# Patient Record
Sex: Female | Born: 1965 | Race: Black or African American | Hispanic: No | Smoking: Current every day smoker
Health system: Southern US, Community
[De-identification: ages and names within clinical notes are randomized; demographics above are authoritative.]

## PROBLEM LIST (undated history)

## (undated) DIAGNOSIS — J45909 Unspecified asthma, uncomplicated: Secondary | ICD-10-CM

## (undated) DIAGNOSIS — E119 Type 2 diabetes mellitus without complications: Secondary | ICD-10-CM

## (undated) DIAGNOSIS — K429 Umbilical hernia without obstruction or gangrene: Secondary | ICD-10-CM

## (undated) DIAGNOSIS — M329 Systemic lupus erythematosus, unspecified: Secondary | ICD-10-CM

## (undated) DIAGNOSIS — M545 Low back pain, unspecified: Secondary | ICD-10-CM

## (undated) DIAGNOSIS — I10 Essential (primary) hypertension: Secondary | ICD-10-CM

## (undated) DIAGNOSIS — Z9289 Personal history of other medical treatment: Secondary | ICD-10-CM

## (undated) DIAGNOSIS — G8929 Other chronic pain: Secondary | ICD-10-CM

## (undated) DIAGNOSIS — K5792 Diverticulitis of intestine, part unspecified, without perforation or abscess without bleeding: Secondary | ICD-10-CM

## (undated) DIAGNOSIS — K219 Gastro-esophageal reflux disease without esophagitis: Secondary | ICD-10-CM

## (undated) DIAGNOSIS — Z862 Personal history of diseases of the blood and blood-forming organs and certain disorders involving the immune mechanism: Secondary | ICD-10-CM

## (undated) DIAGNOSIS — D649 Anemia, unspecified: Secondary | ICD-10-CM

## (undated) DIAGNOSIS — M069 Rheumatoid arthritis, unspecified: Secondary | ICD-10-CM

## (undated) DIAGNOSIS — R079 Chest pain, unspecified: Secondary | ICD-10-CM

## (undated) HISTORY — PX: UMBILICAL HERNIA REPAIR: SHX196

## (undated) HISTORY — PX: TONSILLECTOMY: SUR1361

## (undated) HISTORY — PX: HERNIA REPAIR: SHX51

## (undated) HISTORY — PX: TUBAL LIGATION: SHX77

---

## 1998-12-06 ENCOUNTER — Emergency Department (HOSPITAL_COMMUNITY): Admission: EM | Admit: 1998-12-06 | Discharge: 1998-12-06 | Payer: Self-pay | Admitting: Emergency Medicine

## 2003-08-03 ENCOUNTER — Emergency Department (HOSPITAL_COMMUNITY): Admission: EM | Admit: 2003-08-03 | Discharge: 2003-08-03 | Payer: Self-pay | Admitting: Emergency Medicine

## 2004-02-22 ENCOUNTER — Emergency Department (HOSPITAL_COMMUNITY): Admission: EM | Admit: 2004-02-22 | Discharge: 2004-02-22 | Payer: Self-pay | Admitting: Family Medicine

## 2005-06-07 ENCOUNTER — Emergency Department (HOSPITAL_COMMUNITY): Admission: EM | Admit: 2005-06-07 | Discharge: 2005-06-07 | Payer: Self-pay | Admitting: Emergency Medicine

## 2005-10-03 ENCOUNTER — Emergency Department (HOSPITAL_COMMUNITY): Admission: EM | Admit: 2005-10-03 | Discharge: 2005-10-03 | Payer: Self-pay | Admitting: Emergency Medicine

## 2006-03-05 ENCOUNTER — Emergency Department (HOSPITAL_COMMUNITY): Admission: EM | Admit: 2006-03-05 | Discharge: 2006-03-05 | Payer: Self-pay | Admitting: *Deleted

## 2006-09-18 HISTORY — PX: OTHER SURGICAL HISTORY: SHX169

## 2006-11-02 ENCOUNTER — Ambulatory Visit: Payer: Self-pay | Admitting: Internal Medicine

## 2006-12-03 ENCOUNTER — Encounter: Admission: RE | Admit: 2006-12-03 | Discharge: 2006-12-03 | Payer: Self-pay | Admitting: General Surgery

## 2006-12-04 ENCOUNTER — Ambulatory Visit (HOSPITAL_BASED_OUTPATIENT_CLINIC_OR_DEPARTMENT_OTHER): Admission: RE | Admit: 2006-12-04 | Discharge: 2006-12-04 | Payer: Self-pay | Admitting: General Surgery

## 2006-12-04 ENCOUNTER — Encounter (INDEPENDENT_AMBULATORY_CARE_PROVIDER_SITE_OTHER): Payer: Self-pay | Admitting: Specialist

## 2008-02-04 ENCOUNTER — Emergency Department (HOSPITAL_COMMUNITY): Admission: EM | Admit: 2008-02-04 | Discharge: 2008-02-04 | Payer: Self-pay | Admitting: Emergency Medicine

## 2009-08-20 ENCOUNTER — Emergency Department (HOSPITAL_COMMUNITY): Admission: EM | Admit: 2009-08-20 | Discharge: 2009-08-20 | Payer: Self-pay | Admitting: Family Medicine

## 2010-03-13 ENCOUNTER — Emergency Department (HOSPITAL_COMMUNITY): Admission: EM | Admit: 2010-03-13 | Discharge: 2010-03-13 | Payer: Self-pay | Admitting: Emergency Medicine

## 2010-08-09 ENCOUNTER — Ambulatory Visit (HOSPITAL_COMMUNITY): Admission: RE | Admit: 2010-08-09 | Discharge: 2010-08-09 | Payer: Self-pay | Admitting: Obstetrics

## 2010-08-25 ENCOUNTER — Emergency Department (HOSPITAL_COMMUNITY)
Admission: EM | Admit: 2010-08-25 | Discharge: 2010-08-25 | Disposition: A | Discharge status: Other Acute Inpatient Hospital | Payer: Self-pay | Source: Home / Self Care | Admitting: Family Medicine

## 2010-08-25 ENCOUNTER — Emergency Department (HOSPITAL_COMMUNITY)
Admission: EM | Admit: 2010-08-25 | Discharge: 2010-08-25 | Payer: Self-pay | Source: Home / Self Care | Admitting: Emergency Medicine

## 2010-08-26 ENCOUNTER — Inpatient Hospital Stay (HOSPITAL_COMMUNITY)
Admission: EM | Admit: 2010-08-26 | Discharge: 2010-09-09 | Payer: Self-pay | Source: Home / Self Care | Attending: Internal Medicine | Admitting: Internal Medicine

## 2010-09-05 ENCOUNTER — Ambulatory Visit: Payer: Self-pay | Admitting: Oncology

## 2010-09-14 LAB — CBC & DIFF AND RETIC
BASO%: 0.2 % (ref 0.0–2.0)
Basophils Absolute: 0 10*3/uL (ref 0.0–0.1)
EOS%: 0.6 % (ref 0.0–7.0)
Eosinophils Absolute: 0.1 10*3/uL (ref 0.0–0.5)
HCT: 31.9 % — ABNORMAL LOW (ref 34.8–46.6)
HGB: 9.9 g/dL — ABNORMAL LOW (ref 11.6–15.9)
Immature Retic Fract: 12.6 % — ABNORMAL HIGH (ref 0.00–10.70)
LYMPH%: 21.4 % (ref 14.0–49.7)
MCH: 29.2 pg (ref 25.1–34.0)
MCHC: 31 g/dL — ABNORMAL LOW (ref 31.5–36.0)
MCV: 94.1 fL (ref 79.5–101.0)
MONO#: 0.9 10*3/uL (ref 0.1–0.9)
MONO%: 10.7 % (ref 0.0–14.0)
NEUT#: 5.7 10*3/uL (ref 1.5–6.5)
NEUT%: 67.1 % (ref 38.4–76.8)
Platelets: 135 10*3/uL — ABNORMAL LOW (ref 145–400)
RBC: 3.39 10*6/uL — ABNORMAL LOW (ref 3.70–5.45)
RDW: 21.5 % — ABNORMAL HIGH (ref 11.2–14.5)
Retic %: 3.61 % — ABNORMAL HIGH (ref 0.50–1.50)
Retic Ct Abs: 122.38 10*3/uL — ABNORMAL HIGH (ref 18.30–72.70)
WBC: 8.5 10*3/uL (ref 3.9–10.3)
lymph#: 1.8 10*3/uL (ref 0.9–3.3)
nRBC: 0 % (ref 0–0)

## 2010-09-14 LAB — COMPREHENSIVE METABOLIC PANEL
ALT: 20 U/L (ref 0–35)
AST: 15 U/L (ref 0–37)
Albumin: 3.5 g/dL (ref 3.5–5.2)
Alkaline Phosphatase: 37 U/L — ABNORMAL LOW (ref 39–117)
BUN: 23 mg/dL (ref 6–23)
CO2: 24 mEq/L (ref 19–32)
Calcium: 8.7 mg/dL (ref 8.4–10.5)
Chloride: 105 mEq/L (ref 96–112)
Creatinine, Ser: 0.72 mg/dL (ref 0.40–1.20)
Glucose, Bld: 143 mg/dL — ABNORMAL HIGH (ref 70–99)
Potassium: 3.8 mEq/L (ref 3.5–5.3)
Sodium: 140 mEq/L (ref 135–145)
Total Bilirubin: 0.2 mg/dL — ABNORMAL LOW (ref 0.3–1.2)
Total Protein: 6.9 g/dL (ref 6.0–8.3)

## 2010-09-14 LAB — LACTATE DEHYDROGENASE: LDH: 228 U/L (ref 94–250)

## 2010-09-20 ENCOUNTER — Ambulatory Visit: Payer: Self-pay | Admitting: Oncology

## 2010-09-20 LAB — COMPREHENSIVE METABOLIC PANEL
ALT: 15 U/L (ref 0–35)
AST: 13 U/L (ref 0–37)
Albumin: 3.6 g/dL (ref 3.5–5.2)
Alkaline Phosphatase: 33 U/L — ABNORMAL LOW (ref 39–117)
BUN: 16 mg/dL (ref 6–23)
CO2: 26 mEq/L (ref 19–32)
Calcium: 8.6 mg/dL (ref 8.4–10.5)
Chloride: 106 mEq/L (ref 96–112)
Creatinine, Ser: 0.65 mg/dL (ref 0.40–1.20)
Glucose, Bld: 103 mg/dL — ABNORMAL HIGH (ref 70–99)
Potassium: 4.3 mEq/L (ref 3.5–5.3)
Sodium: 141 mEq/L (ref 135–145)
Total Bilirubin: 0.2 mg/dL — ABNORMAL LOW (ref 0.3–1.2)
Total Protein: 6.9 g/dL (ref 6.0–8.3)

## 2010-09-20 LAB — CBC & DIFF AND RETIC
BASO%: 0.1 % (ref 0.0–2.0)
Basophils Absolute: 0 10*3/uL (ref 0.0–0.1)
EOS%: 0 % (ref 0.0–7.0)
Eosinophils Absolute: 0 10*3/uL (ref 0.0–0.5)
HCT: 33.6 % — ABNORMAL LOW (ref 34.8–46.6)
HGB: 10.4 g/dL — ABNORMAL LOW (ref 11.6–15.9)
Immature Retic Fract: 10.1 % (ref 0.00–10.70)
LYMPH%: 9.6 % — ABNORMAL LOW (ref 14.0–49.7)
MCH: 29.1 pg (ref 25.1–34.0)
MCHC: 31 g/dL — ABNORMAL LOW (ref 31.5–36.0)
MCV: 94.1 fL (ref 79.5–101.0)
MONO#: 0.1 10*3/uL (ref 0.1–0.9)
MONO%: 1.1 % (ref 0.0–14.0)
NEUT#: 7.8 10*3/uL — ABNORMAL HIGH (ref 1.5–6.5)
NEUT%: 89.2 % — ABNORMAL HIGH (ref 38.4–76.8)
Platelets: 225 10*3/uL (ref 145–400)
RBC: 3.57 10*6/uL — ABNORMAL LOW (ref 3.70–5.45)
RDW: 19 % — ABNORMAL HIGH (ref 11.2–14.5)
Retic %: 2.87 % — ABNORMAL HIGH (ref 0.50–1.50)
Retic Ct Abs: 102.46 10*3/uL — ABNORMAL HIGH (ref 18.30–72.70)
WBC: 8.7 10*3/uL (ref 3.9–10.3)
lymph#: 0.8 10*3/uL — ABNORMAL LOW (ref 0.9–3.3)
nRBC: 0 % (ref 0–0)

## 2010-09-20 LAB — MORPHOLOGY: PLT EST: ADEQUATE

## 2010-09-20 LAB — LACTATE DEHYDROGENASE: LDH: 190 U/L (ref 94–250)

## 2010-09-27 LAB — CBC & DIFF AND RETIC
BASO%: 0.1 % (ref 0.0–2.0)
Basophils Absolute: 0 10*3/uL (ref 0.0–0.1)
EOS%: 0.1 % (ref 0.0–7.0)
Eosinophils Absolute: 0 10*3/uL (ref 0.0–0.5)
HCT: 35 % (ref 34.8–46.6)
HGB: 11.3 g/dL — ABNORMAL LOW (ref 11.6–15.9)
Immature Retic Fract: 6 % (ref 0.00–10.70)
LYMPH%: 11.7 % — ABNORMAL LOW (ref 14.0–49.7)
MCH: 29.7 pg (ref 25.1–34.0)
MCHC: 32.3 g/dL (ref 31.5–36.0)
MCV: 92.1 fL (ref 79.5–101.0)
MONO#: 0.3 10*3/uL (ref 0.1–0.9)
MONO%: 3.6 % (ref 0.0–14.0)
NEUT#: 7 10*3/uL — ABNORMAL HIGH (ref 1.5–6.5)
NEUT%: 84.5 % — ABNORMAL HIGH (ref 38.4–76.8)
Platelets: 263 10*3/uL (ref 145–400)
RBC: 3.8 10*6/uL (ref 3.70–5.45)
RDW: 17.1 % — ABNORMAL HIGH (ref 11.2–14.5)
Retic %: 1.63 % — ABNORMAL HIGH (ref 0.50–1.50)
Retic Ct Abs: 61.94 10*3/uL (ref 18.30–72.70)
WBC: 8.3 10*3/uL (ref 3.9–10.3)
lymph#: 1 10*3/uL (ref 0.9–3.3)

## 2010-09-27 LAB — COMPREHENSIVE METABOLIC PANEL
ALT: 16 U/L (ref 0–35)
AST: 12 U/L (ref 0–37)
Albumin: 4.1 g/dL (ref 3.5–5.2)
Alkaline Phosphatase: 33 U/L — ABNORMAL LOW (ref 39–117)
BUN: 18 mg/dL (ref 6–23)
CO2: 24 mEq/L (ref 19–32)
Calcium: 10.3 mg/dL (ref 8.4–10.5)
Chloride: 106 mEq/L (ref 96–112)
Creatinine, Ser: 0.75 mg/dL (ref 0.40–1.20)
Glucose, Bld: 162 mg/dL — ABNORMAL HIGH (ref 70–99)
Potassium: 3.9 mEq/L (ref 3.5–5.3)
Sodium: 141 mEq/L (ref 135–145)
Total Bilirubin: 0.2 mg/dL — ABNORMAL LOW (ref 0.3–1.2)
Total Protein: 7.3 g/dL (ref 6.0–8.3)

## 2010-09-27 LAB — HAPTOGLOBIN: Haptoglobin: 185 mg/dL (ref 16–200)

## 2010-09-27 LAB — MORPHOLOGY: PLT EST: ADEQUATE

## 2010-09-27 LAB — LACTATE DEHYDROGENASE: LDH: 182 U/L (ref 94–250)

## 2010-10-05 LAB — CBC WITH DIFFERENTIAL/PLATELET
BASO%: 0.5 % (ref 0.0–2.0)
Basophils Absolute: 0 10*3/uL (ref 0.0–0.1)
EOS%: 0.5 % (ref 0.0–7.0)
Eosinophils Absolute: 0 10*3/uL (ref 0.0–0.5)
HCT: 42.7 % (ref 34.8–46.6)
HGB: 13.8 g/dL (ref 11.6–15.9)
LYMPH%: 49.2 % (ref 14.0–49.7)
MCH: 29.2 pg (ref 25.1–34.0)
MCHC: 32.3 g/dL (ref 31.5–36.0)
MCV: 90.3 fL (ref 79.5–101.0)
MONO#: 0.5 10*3/uL (ref 0.1–0.9)
MONO%: 8 % (ref 0.0–14.0)
NEUT#: 2.5 10*3/uL (ref 1.5–6.5)
NEUT%: 41.8 % (ref 38.4–76.8)
Platelets: 309 10*3/uL (ref 145–400)
RBC: 4.73 10*6/uL (ref 3.70–5.45)
RDW: 15.9 % — ABNORMAL HIGH (ref 11.2–14.5)
WBC: 5.9 10*3/uL (ref 3.9–10.3)
lymph#: 2.9 10*3/uL (ref 0.9–3.3)

## 2010-10-09 ENCOUNTER — Encounter: Payer: Self-pay | Admitting: Obstetrics

## 2010-10-13 ENCOUNTER — Ambulatory Visit: Admit: 2010-10-13 | Payer: Self-pay | Admitting: Obstetrics and Gynecology

## 2010-11-03 ENCOUNTER — Other Ambulatory Visit: Payer: Self-pay | Admitting: Oncology

## 2010-11-03 ENCOUNTER — Encounter (HOSPITAL_BASED_OUTPATIENT_CLINIC_OR_DEPARTMENT_OTHER): Payer: Medicaid Other | Admitting: Oncology

## 2010-11-03 LAB — CBC WITH DIFFERENTIAL/PLATELET
BASO%: 0.6 % (ref 0.0–2.0)
Basophils Absolute: 0 10*3/uL (ref 0.0–0.1)
EOS%: 3.9 % (ref 0.0–7.0)
Eosinophils Absolute: 0.3 10*3/uL (ref 0.0–0.5)
HCT: 39.8 % (ref 34.8–46.6)
HGB: 12.9 g/dL (ref 11.6–15.9)
LYMPH%: 35.9 % (ref 14.0–49.7)
MCH: 28.8 pg (ref 25.1–34.0)
MCHC: 32.4 g/dL (ref 31.5–36.0)
MCV: 88.8 fL (ref 79.5–101.0)
MONO#: 0.8 10*3/uL (ref 0.1–0.9)
MONO%: 12.1 % (ref 0.0–14.0)
NEUT#: 3.2 10*3/uL (ref 1.5–6.5)
NEUT%: 47.5 % (ref 38.4–76.8)
Platelets: 249 10*3/uL (ref 145–400)
RBC: 4.48 10*6/uL (ref 3.70–5.45)
RDW: 14.3 % (ref 11.2–14.5)
WBC: 6.7 10*3/uL (ref 3.9–10.3)
lymph#: 2.4 10*3/uL (ref 0.9–3.3)
nRBC: 1 % — ABNORMAL HIGH (ref 0–0)

## 2010-11-05 ENCOUNTER — Inpatient Hospital Stay (HOSPITAL_COMMUNITY)
Admission: RE | Admit: 2010-11-05 | Discharge: 2010-11-05 | Disposition: A | Discharge status: Home / Self Care | Payer: Medicaid Other | Source: Ambulatory Visit

## 2010-11-05 ENCOUNTER — Other Ambulatory Visit: Payer: Self-pay | Admitting: Family Medicine

## 2010-11-05 DIAGNOSIS — J029 Acute pharyngitis, unspecified: Secondary | ICD-10-CM

## 2010-11-05 DIAGNOSIS — B9789 Other viral agents as the cause of diseases classified elsewhere: Secondary | ICD-10-CM

## 2010-11-11 ENCOUNTER — Encounter (HOSPITAL_BASED_OUTPATIENT_CLINIC_OR_DEPARTMENT_OTHER): Payer: Medicaid Other | Admitting: Oncology

## 2010-11-11 ENCOUNTER — Other Ambulatory Visit: Payer: Self-pay | Admitting: Oncology

## 2010-11-11 LAB — CBC WITH DIFFERENTIAL/PLATELET
BASO%: 1.3 % (ref 0.0–2.0)
Basophils Absolute: 0.1 10*3/uL (ref 0.0–0.1)
EOS%: 10.2 % — ABNORMAL HIGH (ref 0.0–7.0)
Eosinophils Absolute: 0.5 10*3/uL (ref 0.0–0.5)
HCT: 40.2 % (ref 34.8–46.6)
HGB: 13.2 g/dL (ref 11.6–15.9)
LYMPH%: 49.3 % (ref 14.0–49.7)
MCH: 28.6 pg (ref 25.1–34.0)
MCHC: 32.8 g/dL (ref 31.5–36.0)
MCV: 87.2 fL (ref 79.5–101.0)
MONO#: 0.7 10*3/uL (ref 0.1–0.9)
MONO%: 15.3 % — ABNORMAL HIGH (ref 0.0–14.0)
NEUT#: 1.1 10*3/uL — ABNORMAL LOW (ref 1.5–6.5)
NEUT%: 23.9 % — ABNORMAL LOW (ref 38.4–76.8)
Platelets: 299 10*3/uL (ref 145–400)
RBC: 4.61 10*6/uL (ref 3.70–5.45)
RDW: 14.1 % (ref 11.2–14.5)
WBC: 4.5 10*3/uL (ref 3.9–10.3)
lymph#: 2.2 10*3/uL (ref 0.9–3.3)
nRBC: 2 % — ABNORMAL HIGH (ref 0–0)

## 2010-11-16 ENCOUNTER — Ambulatory Visit (INDEPENDENT_AMBULATORY_CARE_PROVIDER_SITE_OTHER): Payer: Medicaid Other | Admitting: Obstetrics & Gynecology

## 2010-11-18 LAB — POCT RAPID STREP A (OFFICE): Streptococcus, Group A Screen (Direct): NEGATIVE

## 2010-11-28 LAB — DIFFERENTIAL
Basophils Absolute: 0 10*3/uL (ref 0.0–0.1)
Basophils Absolute: 0 10*3/uL (ref 0.0–0.1)
Basophils Absolute: 0 10*3/uL (ref 0.0–0.1)
Basophils Absolute: 0.2 10*3/uL — ABNORMAL HIGH (ref 0.0–0.1)
Basophils Relative: 1 % (ref 0–1)
Eosinophils Absolute: 0 10*3/uL (ref 0.0–0.7)
Eosinophils Absolute: 0 10*3/uL (ref 0.0–0.7)
Eosinophils Absolute: 0.1 10*3/uL (ref 0.0–0.7)
Eosinophils Absolute: 0.1 10*3/uL (ref 0.0–0.7)
Eosinophils Relative: 0 % (ref 0–5)
Lymphocytes Relative: 15 % (ref 12–46)
Lymphocytes Relative: 34 % (ref 12–46)
Lymphs Abs: 2 10*3/uL (ref 0.7–4.0)
Lymphs Abs: 0.6 10*3/uL — ABNORMAL LOW (ref 0.7–4.0)
Lymphs Abs: 1.4 10*3/uL (ref 0.7–4.0)
Monocytes Absolute: 0.1 10*3/uL (ref 0.1–1.0)
Monocytes Absolute: 0.4 10*3/uL (ref 0.1–1.0)
Monocytes Absolute: 0.4 10*3/uL (ref 0.1–1.0)
Monocytes Absolute: 1.7 10*3/uL — ABNORMAL HIGH (ref 0.1–1.0)
Monocytes Relative: 8 % (ref 3–12)
Monocytes Relative: 8 % (ref 3–12)
Neutro Abs: 16.1 10*3/uL — ABNORMAL HIGH (ref 1.7–7.7)
Neutro Abs: 14.2 10*3/uL — ABNORMAL HIGH (ref 1.7–7.7)
Neutro Abs: 2.5 10*3/uL (ref 1.7–7.7)
Neutro Abs: 5.7 10*3/uL (ref 1.7–7.7)
Neutrophils Relative %: 56 % (ref 43–77)
Neutrophils Relative %: 75 % (ref 43–77)

## 2010-11-28 LAB — CBC
HCT: 26.4 % — ABNORMAL LOW (ref 36.0–46.0)
HCT: 26.6 % — ABNORMAL LOW (ref 36.0–46.0)
HCT: 27.6 % — ABNORMAL LOW (ref 36.0–46.0)
HCT: 28 % — ABNORMAL LOW (ref 36.0–46.0)
HCT: 29.5 % — ABNORMAL LOW (ref 36.0–46.0)
HCT: 29.7 % — ABNORMAL LOW (ref 36.0–46.0)
HCT: 31.8 % — ABNORMAL LOW (ref 36.0–46.0)
HCT: 24.2 % — ABNORMAL LOW (ref 36.0–46.0)
HCT: 24.5 % — ABNORMAL LOW (ref 36.0–46.0)
HCT: 25.6 % — ABNORMAL LOW (ref 36.0–46.0)
HCT: 27.5 % — ABNORMAL LOW (ref 36.0–46.0)
HCT: 28.3 % — ABNORMAL LOW (ref 36.0–46.0)
Hemoglobin: 7.9 g/dL — ABNORMAL LOW (ref 12.0–15.0)
Hemoglobin: 8 g/dL — ABNORMAL LOW (ref 12.0–15.0)
Hemoglobin: 8.2 g/dL — ABNORMAL LOW (ref 12.0–15.0)
Hemoglobin: 8.4 g/dL — ABNORMAL LOW (ref 12.0–15.0)
Hemoglobin: 9 g/dL — ABNORMAL LOW (ref 12.0–15.0)
Hemoglobin: 9.2 g/dL — ABNORMAL LOW (ref 12.0–15.0)
Hemoglobin: 9.3 g/dL — ABNORMAL LOW (ref 12.0–15.0)
Hemoglobin: 4.8 g/dL — CL (ref 12.0–15.0)
Hemoglobin: 7.5 g/dL — ABNORMAL LOW (ref 12.0–15.0)
Hemoglobin: 8.3 g/dL — ABNORMAL LOW (ref 12.0–15.0)
Hemoglobin: 8.5 g/dL — ABNORMAL LOW (ref 12.0–15.0)
Hemoglobin: 8.5 g/dL — ABNORMAL LOW (ref 12.0–15.0)
Hemoglobin: 8.8 g/dL — ABNORMAL LOW (ref 12.0–15.0)
MCH: 27.6 pg (ref 26.0–34.0)
MCH: 27.9 pg (ref 26.0–34.0)
MCH: 28.2 pg (ref 26.0–34.0)
MCH: 28.5 pg (ref 26.0–34.0)
MCH: 28.8 pg (ref 26.0–34.0)
MCH: 29 pg (ref 26.0–34.0)
MCH: 23.9 pg — ABNORMAL LOW (ref 26.0–34.0)
MCH: 24 pg — ABNORMAL LOW (ref 26.0–34.0)
MCH: 28 pg (ref 26.0–34.0)
MCH: 29.4 pg (ref 26.0–34.0)
MCHC: 29.2 g/dL — ABNORMAL LOW (ref 30.0–36.0)
MCHC: 29.7 g/dL — ABNORMAL LOW (ref 30.0–36.0)
MCHC: 29.7 g/dL — ABNORMAL LOW (ref 30.0–36.0)
MCHC: 29.8 g/dL — ABNORMAL LOW (ref 30.0–36.0)
MCHC: 30 g/dL (ref 30.0–36.0)
MCHC: 30.3 g/dL (ref 30.0–36.0)
MCHC: 30.3 g/dL (ref 30.0–36.0)
MCHC: 27.7 g/dL — ABNORMAL LOW (ref 30.0–36.0)
MCHC: 27.9 g/dL — ABNORMAL LOW (ref 30.0–36.0)
MCHC: 28.1 g/dL — ABNORMAL LOW (ref 30.0–36.0)
MCHC: 30.6 g/dL (ref 30.0–36.0)
MCHC: 30.9 g/dL (ref 30.0–36.0)
MCHC: 30.9 g/dL (ref 30.0–36.0)
MCV: 92.1 fL (ref 78.0–100.0)
MCV: 94 fL (ref 78.0–100.0)
MCV: 95.1 fL (ref 78.0–100.0)
MCV: 96.1 fL (ref 78.0–100.0)
MCV: 96.4 fL (ref 78.0–100.0)
MCV: 96.4 fL (ref 78.0–100.0)
MCV: 96.9 fL (ref 78.0–100.0)
MCV: 85.6 fL (ref 78.0–100.0)
MCV: 86.5 fL (ref 78.0–100.0)
MCV: 88.6 fL (ref 78.0–100.0)
MCV: 88.8 fL (ref 78.0–100.0)
MCV: 90.5 fL (ref 78.0–100.0)
MCV: 92.9 fL (ref 78.0–100.0)
Platelets: 14 10*3/uL — CL (ref 150–400)
Platelets: 17 10*3/uL — CL (ref 150–400)
Platelets: 20 10*3/uL — CL (ref 150–400)
Platelets: 24 10*3/uL — CL (ref 150–400)
Platelets: 12 10*3/uL — CL (ref 150–400)
Platelets: 35 10*3/uL — ABNORMAL LOW (ref 150–400)
Platelets: <5 10*3/uL — CL (ref 150–400)
Platelets: <5 10*3/uL — CL (ref 150–400)
RBC: 2.83 MIL/uL — ABNORMAL LOW (ref 3.87–5.11)
RBC: 2.85 MIL/uL — ABNORMAL LOW (ref 3.87–5.11)
RBC: 2.88 MIL/uL — ABNORMAL LOW (ref 3.87–5.11)
RBC: 3.04 MIL/uL — ABNORMAL LOW (ref 3.87–5.11)
RBC: 2.09 MIL/uL — ABNORMAL LOW (ref 3.87–5.11)
RBC: 2.76 MIL/uL — ABNORMAL LOW (ref 3.87–5.11)
RBC: 2.82 MIL/uL — ABNORMAL LOW (ref 3.87–5.11)
RBC: 3.04 MIL/uL — ABNORMAL LOW (ref 3.87–5.11)
RBC: 3.29 MIL/uL — ABNORMAL LOW (ref 3.87–5.11)
RDW: 23.9 % — ABNORMAL HIGH (ref 11.5–15.5)
RDW: 24.4 % — ABNORMAL HIGH (ref 11.5–15.5)
RDW: 25.4 % — ABNORMAL HIGH (ref 11.5–15.5)
RDW: 26.2 % — ABNORMAL HIGH (ref 11.5–15.5)
RDW: 18.4 % — ABNORMAL HIGH (ref 11.5–15.5)
RDW: 19 % — ABNORMAL HIGH (ref 11.5–15.5)
RDW: 20.5 % — ABNORMAL HIGH (ref 11.5–15.5)
RDW: 21.5 % — ABNORMAL HIGH (ref 11.5–15.5)
RDW: 21.8 % — ABNORMAL HIGH (ref 11.5–15.5)
RDW: 21.8 % — ABNORMAL HIGH (ref 11.5–15.5)
RDW: 21.9 % — ABNORMAL HIGH (ref 11.5–15.5)
WBC: 16.6 10*3/uL — ABNORMAL HIGH (ref 4.0–10.5)
WBC: 18.4 10*3/uL — ABNORMAL HIGH (ref 4.0–10.5)
WBC: 20.8 10*3/uL — ABNORMAL HIGH (ref 4.0–10.5)
WBC: 16.5 10*3/uL — ABNORMAL HIGH (ref 4.0–10.5)
WBC: 17 10*3/uL — ABNORMAL HIGH (ref 4.0–10.5)
WBC: 18.9 10*3/uL — ABNORMAL HIGH (ref 4.0–10.5)
WBC: 4.7 10*3/uL (ref 4.0–10.5)
WBC: 5.8 10*3/uL (ref 4.0–10.5)
WBC: 9.5 10*3/uL (ref 4.0–10.5)

## 2010-11-28 LAB — CROSSMATCH
Antibody Screen: POSITIVE
Unit division: 0
Unit division: 0

## 2010-11-28 LAB — CULTURE, BLOOD (ROUTINE X 2)
Culture  Setup Time: 201112091940
Culture: NO GROWTH

## 2010-11-28 LAB — COMPREHENSIVE METABOLIC PANEL
ALT: 61 U/L — ABNORMAL HIGH (ref 0–35)
ALT: 13 U/L (ref 0–35)
AST: 27 U/L (ref 0–37)
Albumin: 2.8 g/dL — ABNORMAL LOW (ref 3.5–5.2)
Albumin: 2.8 g/dL — ABNORMAL LOW (ref 3.5–5.2)
Alkaline Phosphatase: 42 U/L (ref 39–117)
Alkaline Phosphatase: 44 U/L (ref 39–117)
Alkaline Phosphatase: 53 U/L (ref 39–117)
BUN: 15 mg/dL (ref 6–23)
BUN: 10 mg/dL (ref 6–23)
CO2: 26 mEq/L (ref 19–32)
CO2: 28 mEq/L (ref 19–32)
CO2: 22 mEq/L (ref 19–32)
Calcium: 9 mg/dL (ref 8.4–10.5)
Chloride: 100 mEq/L (ref 96–112)
Chloride: 106 mEq/L (ref 96–112)
Chloride: 108 mEq/L (ref 96–112)
Creatinine, Ser: 0.65 mg/dL (ref 0.4–1.2)
Creatinine, Ser: 0.63 mg/dL (ref 0.4–1.2)
GFR calc Af Amer: >60 mL/min (ref >60)
GFR calc Af Amer: >60 mL/min (ref >60)
GFR calc non Af Amer: >60 mL/min (ref >60)
GFR calc non Af Amer: >60 mL/min (ref >60)
GFR calc non Af Amer: >60 mL/min (ref >60)
Glucose, Bld: 122 mg/dL — ABNORMAL HIGH (ref 70–99)
Glucose, Bld: 134 mg/dL — ABNORMAL HIGH (ref 70–99)
Potassium: 4 mEq/L (ref 3.5–5.1)
Potassium: 4.2 mEq/L (ref 3.5–5.1)
Potassium: 3.9 mEq/L (ref 3.5–5.1)
Sodium: 135 mEq/L (ref 135–145)
Sodium: 135 mEq/L (ref 135–145)
Total Bilirubin: 0.4 mg/dL (ref 0.3–1.2)
Total Bilirubin: 0.6 mg/dL (ref 0.3–1.2)
Total Bilirubin: 0.4 mg/dL (ref 0.3–1.2)
Total Protein: 8.6 g/dL — ABNORMAL HIGH (ref 6.0–8.3)

## 2010-11-28 LAB — HAPTOGLOBIN
Haptoglobin: <6 mg/dL — ABNORMAL LOW (ref 16–200)
Haptoglobin: <6 mg/dL — ABNORMAL LOW (ref 16–200)
Haptoglobin: 128 mg/dL (ref 16–200)
Haptoglobin: 139 mg/dL (ref 16–200)
Haptoglobin: <6 mg/dL — ABNORMAL LOW (ref 16–200)

## 2010-11-28 LAB — RETICULOCYTES
Retic Count, Absolute: 134 10*3/uL (ref 19.0–186.0)
Retic Count, Absolute: 242.2 10*3/uL — ABNORMAL HIGH (ref 19.0–186.0)
Retic Ct Pct: 12.5 % — ABNORMAL HIGH (ref 0.4–3.1)
Retic Ct Pct: 8.1 % — ABNORMAL HIGH (ref 0.4–3.1)

## 2010-11-28 LAB — DIC (DISSEMINATED INTRAVASCULAR COAGULATION)PANEL
D-Dimer, Quant: 1.66 ug/mL-FEU — ABNORMAL HIGH (ref 0.00–0.48)
Fibrinogen: 335 mg/dL (ref 204–475)
Smear Review: NONE SEEN
aPTT: 25 seconds (ref 24–37)

## 2010-11-28 LAB — URINALYSIS, ROUTINE W REFLEX MICROSCOPIC
Bilirubin Urine: NEGATIVE
Glucose, UA: NEGATIVE mg/dL
Ketones, ur: NEGATIVE mg/dL
Leukocytes, UA: NEGATIVE
pH: 7 (ref 5.0–8.0)

## 2010-11-28 LAB — ANA: Anti Nuclear Antibody(ANA): POSITIVE — AB

## 2010-11-28 LAB — BETA-2-GLYCOPROTEIN I ABS, IGG/M/A
Beta-2-Glycoprotein I IgA: 5 A Units (ref <20)
Beta-2-Glycoprotein I IgM: 0 M Units (ref <20)

## 2010-11-28 LAB — C3 COMPLEMENT: C3 Complement: 116 mg/dL (ref 88–201)

## 2010-11-28 LAB — TSH: TSH: 0.579 u[IU]/mL (ref 0.350–4.500)

## 2010-11-28 LAB — IRON AND TIBC
Iron: 31 ug/dL — ABNORMAL LOW (ref 42–135)
Saturation Ratios: 12 % — ABNORMAL LOW (ref 20–55)
Saturation Ratios: 7 % — ABNORMAL LOW (ref 20–55)
TIBC: 418 ug/dL (ref 250–470)
TIBC: 422 ug/dL (ref 250–470)
UIBC: 387 ug/dL

## 2010-11-28 LAB — MRSA PCR SCREENING: MRSA by PCR: NEGATIVE

## 2010-11-28 LAB — PREPARE PLATELETS

## 2010-11-28 LAB — BASIC METABOLIC PANEL
BUN: 13 mg/dL (ref 6–23)
BUN: 13 mg/dL (ref 6–23)
CO2: 22 mEq/L (ref 19–32)
CO2: 22 mEq/L (ref 19–32)
CO2: 24 mEq/L (ref 19–32)
Calcium: 8.5 mg/dL (ref 8.4–10.5)
Calcium: 9.2 mg/dL (ref 8.4–10.5)
Chloride: 106 mEq/L (ref 96–112)
Chloride: 106 mEq/L (ref 96–112)
Chloride: 112 mEq/L (ref 96–112)
Creatinine, Ser: 0.62 mg/dL (ref 0.4–1.2)
Creatinine, Ser: 0.66 mg/dL (ref 0.4–1.2)
Creatinine, Ser: 0.68 mg/dL (ref 0.4–1.2)
GFR calc Af Amer: >60 mL/min (ref >60)
GFR calc Af Amer: >60 mL/min (ref >60)
GFR calc Af Amer: >60 mL/min (ref >60)
GFR calc non Af Amer: >60 mL/min (ref >60)
GFR calc non Af Amer: >60 mL/min (ref >60)
Glucose, Bld: 139 mg/dL — ABNORMAL HIGH (ref 70–99)
Potassium: 3.9 mEq/L (ref 3.5–5.1)
Sodium: 135 mEq/L (ref 135–145)
Sodium: 137 mEq/L (ref 135–145)

## 2010-11-28 LAB — PREGNANCY, URINE: Preg Test, Ur: NEGATIVE

## 2010-11-28 LAB — CYCLIC CITRUL PEPTIDE ANTIBODY, IGG: Cyclic Citrullin Peptide Ab: <2 U/mL (ref 0.0–5.0)

## 2010-11-28 LAB — PATHOLOGIST SMEAR REVIEW

## 2010-11-28 LAB — SEDIMENTATION RATE: Sed Rate: 129 mm/hr — ABNORMAL HIGH (ref 0–22)

## 2010-11-28 LAB — LACTATE DEHYDROGENASE
LDH: 379 U/L — ABNORMAL HIGH (ref 94–250)
LDH: 296 U/L — ABNORMAL HIGH (ref 94–250)

## 2010-11-28 LAB — APTT: aPTT: 27 seconds (ref 24–37)

## 2010-11-28 LAB — CRYOGLOBULIN

## 2010-11-28 LAB — MAGNESIUM: Magnesium: 1.7 mg/dL (ref 1.5–2.5)

## 2010-11-28 LAB — URINE MICROSCOPIC-ADD ON

## 2010-11-28 LAB — URINE CULTURE: Culture  Setup Time: 201112091407

## 2010-11-28 LAB — COMPLEMENT, TOTAL: Compl, Total (CH50): >60 U/mL — ABNORMAL HIGH (ref 31–60)

## 2010-11-28 LAB — LUPUS ANTICOAGULANT PANEL: Lupus Anticoagulant: NOT DETECTED

## 2010-11-28 LAB — WET PREP, GENITAL: WBC, Wet Prep HPF POC: NONE SEEN

## 2010-11-28 LAB — HEPATITIS PANEL, ACUTE
HCV Ab: NEGATIVE
Hep A IgM: NEGATIVE

## 2010-11-28 LAB — SJOGRENS SYNDROME-A EXTRACTABLE NUCLEAR ANTIBODY: SSA (Ro) (ENA) Antibody, IgG: 63 AU/mL — ABNORMAL HIGH (ref <30)

## 2010-11-28 LAB — FERRITIN: Ferritin: 31 ng/mL (ref 10–291)

## 2010-11-28 LAB — C-REACTIVE PROTEIN: CRP: 0.3 mg/dL — ABNORMAL LOW (ref <0.6)

## 2010-11-28 LAB — FOLATE: Folate: >20 ng/mL

## 2010-11-28 LAB — ANTI-RIBONUCLEIC ACID ANTIBODY: Sm/rnp: 48 AU/mL — ABNORMAL HIGH (ref <30)

## 2010-11-28 LAB — SJOGRENS SYNDROME-B EXTRACTABLE NUCLEAR ANTIBODY: SSB (La) (ENA) Antibody, IgG: 3 AU/mL (ref <30)

## 2010-11-28 LAB — RHEUMATOID FACTOR: Rhuematoid fact SerPl-aCnc: <20 IU/mL (ref 0–20)

## 2010-11-28 LAB — ANTI-SMOOTH MUSCLE ANTIBODY, IGG: F-Actin IgG: <20

## 2010-11-29 LAB — CBC
HCT: 17.9 % — ABNORMAL LOW (ref 36.0–46.0)
MCH: 22.5 pg — ABNORMAL LOW (ref 26.0–34.0)
MCV: 73.9 fL — ABNORMAL LOW (ref 78.0–100.0)
RBC: 2.42 MIL/uL — ABNORMAL LOW (ref 3.87–5.11)
WBC: 6.7 10*3/uL (ref 4.0–10.5)

## 2010-11-30 ENCOUNTER — Ambulatory Visit: Payer: Medicaid Other | Admitting: Obstetrics & Gynecology

## 2010-12-09 ENCOUNTER — Other Ambulatory Visit: Payer: Self-pay | Admitting: Oncology

## 2010-12-09 ENCOUNTER — Encounter (HOSPITAL_BASED_OUTPATIENT_CLINIC_OR_DEPARTMENT_OTHER): Payer: Medicaid Other | Admitting: Oncology

## 2010-12-09 LAB — CBC WITH DIFFERENTIAL/PLATELET
EOS%: 2.3 % (ref 0.0–7.0)
LYMPH%: 32.4 % (ref 14.0–49.7)
MCH: 28 pg (ref 25.1–34.0)
MCV: 85.2 fL (ref 79.5–101.0)
MONO%: 9.5 % (ref 0.0–14.0)
Platelets: 293 10*3/uL (ref 145–400)
RBC: 4.53 10*6/uL (ref 3.70–5.45)
RDW: 13.6 % (ref 11.2–14.5)
nRBC: 0 % (ref 0–0)

## 2010-12-20 LAB — POCT URINALYSIS DIP (DEVICE)
Glucose, UA: NEGATIVE mg/dL
Nitrite: NEGATIVE
Protein, ur: NEGATIVE mg/dL
Specific Gravity, Urine: 1.01 (ref 1.005–1.030)
Urobilinogen, UA: 0.2 mg/dL (ref 0.0–1.0)

## 2010-12-20 LAB — URINE CULTURE

## 2010-12-20 LAB — POCT PREGNANCY, URINE: Preg Test, Ur: NEGATIVE

## 2011-01-11 ENCOUNTER — Encounter: Payer: Medicaid Other | Admitting: Family Medicine

## 2011-02-03 NOTE — Op Note (Signed)
NAME:  Dana Kennedy, Dana Kennedy                 ACCOUNT NO.:  [id number]   MEDICAL RECORD NO.:  [id number]          PATIENT TYPE:  AMB   LOCATION:  DSC                          FACILITY:  [facility]   PHYSICIAN:  Cherylynn Ridges, M.D.    DATE OF BIRTH:  1966/01/26   DATE OF PROCEDURE:  12/04/2006  DATE OF DISCHARGE:                               OPERATIVE REPORT   PREOPERATIVE DIAGNOSES:  1. Epigastric hernia.  2. Umbilical hernia.  3. Right axillary lipoma.   POSTOPERATIVE DIAGNOSES:  1. Epigastric hernia measuring 3 cm in size.  2. Umbilical hernia measuring 3 cm in size.  3. Right axillary lipoma measuring 10 x 10 x 4 cm.   PROCEDURES:  1. Repair of epigastric hernia with mesh.  2. Repair of umbilical hernia without mesh.  3. Resection and excision of lipoma.   SURGEON:  Marta Lamas. Lindie Spruce, M.D.   ASSISTANT:  None.   ANESTHESIA:  General endotracheal.   ESTIMATED BLOOD LOSS:  Less than 30 mL.   COMPLICATIONS:  None.   CONDITION:  Stable.   SPECIMEN:  Lipoma.   OPERATION:  The patient was taken to the operating room, placed on table  in supine position.  After an adequate general anesthetic was  administered, she was prepped and draped in the usual sterile manner  exposing the midline.   A midline incision was made using a #10 blade, made initially about 5-6  cm long.  We excised into subcutaneous tissue down around the hernia  sac, which protruded out.  The neck of the sac was only about 3 cm.  We  dissected out the sac at that level and ligated it with 3-0 Vicryl ties  as we excised the excess sac.  We allowed the ligated portion that was  transected to fall back into the subfascial space and then we closed off  this using interrupted #1 Novofil sutures.   Once this was done we placed an oval piece of mesh measuring 4 x 2 cm in  size on top of the previous primary repair with #1 Novofil and tacked it  down with #1 Novofil.  We irrigated and then soaked the mesh in  antibiotic  solution.  Then we closed this hernia in three layers, a deep  subcu layer of 2-0 Vicryl, a more superficial subcu layer of 3-0 Vicryl,  and then the skin was closed using stainless steel staples.   We then addressed the umbilical hernia, which was dissected out through  a subumbilical incision.  There were defects, one which was just  inferior to the umbilicus, one into the umbilicus.  We dissected out  sac, exposed it down to the fascia, and then repaired it with primary  interrupted 0 Ethilon suture.  We then closed the skin using  subcuticular 4-0 Monocryl after tacking down the umbilicus in an  inverted manner with 3-0 Vicryl.   We then tilted the patient slightly to the left with a roll underneath  the right axilla as we prepped and draped the right axillary area for  removal of the lipoma.  A transverse incision was made using a #15 blade  and then we dissected out this lipoma, which had multiple  interdigitations into the subcutaneous tissue.  We removed it, obtained  adequate hemostasis, and then closed in two layers, a deep subcu layer  of 3-0 Vicryl.  Then the skin was closed using interrupted simple stitch  of 4-0 nylon.  Sterile dressings were applied to all wounds, including  Steri-Strips at the umbilical site.  All needle, sponge counts and  instrument counts were correct.  We used 0.5% Marcaine without  epinephrine to provide postop anesthesia at all sites.      Cherylynn Ridges, M.D.  Electronically Signed     JOW/MEDQ  D:  12/04/2006  T:  12/04/2006  Job:  [id number]

## 2011-02-22 ENCOUNTER — Other Ambulatory Visit: Payer: Self-pay | Admitting: Oncology

## 2011-02-22 ENCOUNTER — Encounter (HOSPITAL_BASED_OUTPATIENT_CLINIC_OR_DEPARTMENT_OTHER): Payer: Medicaid Other | Admitting: Oncology

## 2011-02-22 DIAGNOSIS — D509 Iron deficiency anemia, unspecified: Secondary | ICD-10-CM

## 2011-02-22 DIAGNOSIS — D693 Immune thrombocytopenic purpura: Secondary | ICD-10-CM

## 2011-02-22 DIAGNOSIS — D591 Autoimmune hemolytic anemia, unspecified: Secondary | ICD-10-CM

## 2011-02-22 LAB — CBC WITH DIFFERENTIAL/PLATELET
Basophils Absolute: 0 10*3/uL (ref 0.0–0.1)
Eosinophils Absolute: 0.2 10*3/uL (ref 0.0–0.5)
HCT: 37.5 % (ref 34.8–46.6)
HGB: 12.3 g/dL (ref 11.6–15.9)
MCH: 26.9 pg (ref 25.1–34.0)
NEUT#: 2 10*3/uL (ref 1.5–6.5)
NEUT%: 40 % (ref 38.4–76.8)
RDW: 14.1 % (ref 11.2–14.5)
lymph#: 2.2 10*3/uL (ref 0.9–3.3)

## 2011-02-22 LAB — MORPHOLOGY: PLT EST: ADEQUATE

## 2011-05-05 ENCOUNTER — Other Ambulatory Visit (HOSPITAL_COMMUNITY): Payer: Self-pay | Admitting: Obstetrics

## 2011-05-05 DIAGNOSIS — Z1231 Encounter for screening mammogram for malignant neoplasm of breast: Secondary | ICD-10-CM

## 2011-05-10 ENCOUNTER — Encounter: Payer: Medicaid Other | Admitting: Family Medicine

## 2011-05-11 ENCOUNTER — Ambulatory Visit (HOSPITAL_COMMUNITY)
Admission: RE | Admit: 2011-05-11 | Discharge: 2011-05-11 | Disposition: A | Discharge status: Home / Self Care | Payer: Medicaid Other | Source: Ambulatory Visit | Attending: Obstetrics | Admitting: Obstetrics

## 2011-05-11 DIAGNOSIS — Z1231 Encounter for screening mammogram for malignant neoplasm of breast: Secondary | ICD-10-CM

## 2011-05-16 ENCOUNTER — Other Ambulatory Visit: Payer: Self-pay | Admitting: Obstetrics

## 2011-05-16 DIAGNOSIS — R928 Other abnormal and inconclusive findings on diagnostic imaging of breast: Secondary | ICD-10-CM

## 2011-06-12 ENCOUNTER — Inpatient Hospital Stay (HOSPITAL_COMMUNITY): Admission: RE | Admit: 2011-06-12 | Payer: Medicaid Other | Source: Ambulatory Visit

## 2011-06-14 ENCOUNTER — Ambulatory Visit: Payer: Medicaid Other

## 2011-06-14 LAB — OCCULT BLOOD X 1 CARD TO LAB, STOOL: Fecal Occult Bld: NEGATIVE

## 2011-06-14 LAB — COMPREHENSIVE METABOLIC PANEL
ALT: 14
AST: 15
BUN: 12
Creatinine, Ser: 0.84
GFR calc non Af Amer: >60
Glucose, Bld: 93
Potassium: 4.2
Total Protein: 7

## 2011-06-14 LAB — CBC
HCT: 33.1 — ABNORMAL LOW
Hemoglobin: 11.5 — ABNORMAL LOW
MCV: 85
RDW: 13.6

## 2011-06-14 LAB — URINALYSIS, ROUTINE W REFLEX MICROSCOPIC
Leukocytes, UA: NEGATIVE
Nitrite: NEGATIVE
Protein, ur: NEGATIVE
Specific Gravity, Urine: 1.03
Urobilinogen, UA: 1

## 2011-06-14 LAB — URINE MICROSCOPIC-ADD ON

## 2011-06-14 LAB — DIFFERENTIAL
Eosinophils Absolute: 0.3
Eosinophils Relative: 4
Lymphs Abs: 1.8
Monocytes Absolute: 0.5

## 2011-06-14 LAB — LIPASE, BLOOD: Lipase: 22

## 2011-06-14 LAB — POCT CARDIAC MARKERS
CKMB, poc: <1 — ABNORMAL LOW
Myoglobin, poc: 30.3
Troponin i, poc: <0.05

## 2011-06-14 LAB — D-DIMER, QUANTITATIVE (NOT AT ARMC): D-Dimer, Quant: 0.31

## 2011-06-20 ENCOUNTER — Emergency Department (HOSPITAL_COMMUNITY)
Admission: EM | Admit: 2011-06-20 | Discharge: 2011-06-20 | Disposition: A | Discharge status: Home / Self Care | Payer: Medicaid Other | Attending: Emergency Medicine | Admitting: Emergency Medicine

## 2011-06-20 DIAGNOSIS — N898 Other specified noninflammatory disorders of vagina: Secondary | ICD-10-CM | POA: Insufficient documentation

## 2011-06-20 DIAGNOSIS — R143 Flatulence: Secondary | ICD-10-CM | POA: Insufficient documentation

## 2011-06-20 DIAGNOSIS — J069 Acute upper respiratory infection, unspecified: Secondary | ICD-10-CM | POA: Insufficient documentation

## 2011-06-20 DIAGNOSIS — R141 Gas pain: Secondary | ICD-10-CM | POA: Insufficient documentation

## 2011-06-20 DIAGNOSIS — R142 Eructation: Secondary | ICD-10-CM | POA: Insufficient documentation

## 2011-06-20 DIAGNOSIS — R109 Unspecified abdominal pain: Secondary | ICD-10-CM | POA: Insufficient documentation

## 2011-06-20 DIAGNOSIS — J3489 Other specified disorders of nose and nasal sinuses: Secondary | ICD-10-CM | POA: Insufficient documentation

## 2011-06-20 LAB — DIFFERENTIAL
Basophils Absolute: 0 10*3/uL (ref 0.0–0.1)
Basophils Relative: 0 % (ref 0–1)
Eosinophils Absolute: 0.3 10*3/uL (ref 0.0–0.7)
Eosinophils Relative: 4 % (ref 0–5)
Monocytes Absolute: 0.6 10*3/uL (ref 0.1–1.0)
Monocytes Relative: 9 % (ref 3–12)

## 2011-06-20 LAB — URINE MICROSCOPIC-ADD ON

## 2011-06-20 LAB — CBC
MCH: 27.4 pg (ref 26.0–34.0)
MCHC: 33.3 g/dL (ref 30.0–36.0)
RDW: 14.2 % (ref 11.5–15.5)

## 2011-06-20 LAB — URINALYSIS, ROUTINE W REFLEX MICROSCOPIC
Hgb urine dipstick: NEGATIVE
Nitrite: NEGATIVE
Protein, ur: NEGATIVE mg/dL
Specific Gravity, Urine: 1.018 (ref 1.005–1.030)
Urobilinogen, UA: 1 mg/dL (ref 0.0–1.0)

## 2011-06-20 LAB — BASIC METABOLIC PANEL
Calcium: 9.4 mg/dL (ref 8.4–10.5)
GFR calc Af Amer: >90 mL/min (ref >90)
GFR calc non Af Amer: >90 mL/min (ref >90)
Glucose, Bld: 90 mg/dL (ref 70–99)
Potassium: 3.7 mEq/L (ref 3.5–5.1)
Sodium: 141 mEq/L (ref 135–145)

## 2011-06-20 LAB — WET PREP, GENITAL: Trich, Wet Prep: NONE SEEN

## 2011-06-21 ENCOUNTER — Other Ambulatory Visit: Payer: Self-pay | Admitting: Internal Medicine

## 2011-06-21 ENCOUNTER — Encounter (HOSPITAL_BASED_OUTPATIENT_CLINIC_OR_DEPARTMENT_OTHER): Payer: Medicaid Other | Admitting: Oncology

## 2011-06-21 ENCOUNTER — Other Ambulatory Visit: Payer: Self-pay | Admitting: Oncology

## 2011-06-21 DIAGNOSIS — D693 Immune thrombocytopenic purpura: Secondary | ICD-10-CM

## 2011-06-21 DIAGNOSIS — D509 Iron deficiency anemia, unspecified: Secondary | ICD-10-CM

## 2011-06-21 DIAGNOSIS — J019 Acute sinusitis, unspecified: Secondary | ICD-10-CM

## 2011-06-21 LAB — MORPHOLOGY: RBC Comments: NORMAL

## 2011-06-21 LAB — CBC WITH DIFFERENTIAL/PLATELET
Basophils Absolute: 0 10*3/uL (ref 0.0–0.1)
EOS%: 5.3 % (ref 0.0–7.0)
Eosinophils Absolute: 0.3 10*3/uL (ref 0.0–0.5)
HGB: 11.9 g/dL (ref 11.6–15.9)
MCV: 81.2 fL (ref 79.5–101.0)
MONO%: 10.1 % (ref 0.0–14.0)
NEUT#: 2.9 10*3/uL (ref 1.5–6.5)
RBC: 4.41 10*6/uL (ref 3.70–5.45)
RDW: 14 % (ref 11.2–14.5)
lymph#: 2.1 10*3/uL (ref 0.9–3.3)
nRBC: 0 % (ref 0–0)

## 2011-06-22 LAB — GC/CHLAMYDIA PROBE AMP, GENITAL
Chlamydia, DNA Probe: NEGATIVE
GC Probe Amp, Genital: NEGATIVE

## 2011-09-29 ENCOUNTER — Ambulatory Visit
Admission: RE | Admit: 2011-09-29 | Discharge: 2011-09-29 | Disposition: A | Discharge status: Home / Self Care | Payer: Medicaid Other | Source: Ambulatory Visit | Attending: Obstetrics | Admitting: Obstetrics

## 2011-09-29 ENCOUNTER — Other Ambulatory Visit: Payer: Self-pay | Admitting: Obstetrics

## 2011-09-29 DIAGNOSIS — R928 Other abnormal and inconclusive findings on diagnostic imaging of breast: Secondary | ICD-10-CM

## 2011-10-04 ENCOUNTER — Telehealth: Payer: Self-pay

## 2011-10-04 NOTE — Telephone Encounter (Signed)
TRIED PT.'S HOME NUMBER X 2 TODAY LM TO CALL BACK WITH DETAILS OF SYMPTOMS SHE IS EXPERIENCING THAT SHE NEEDS TO SEE DR. Darrold Span THIS MONTH.  CELL PHONE NUMBER LISTED IS NOT CONNECTED.

## 2011-10-06 ENCOUNTER — Telehealth: Payer: Self-pay

## 2011-10-06 NOTE — Telephone Encounter (Signed)
CALLED PT. ON CELL PHONE.  TOLD HER THAT DR. Darrold Span SAID THAT SHE WANTS HER TO MAKE AN APPT. AT [facility] TO A [facility]. SHE ALSO NEED TO CALL DR. Adela Glimpse A.  MARSHALL AND SET UP AN APPOINTMENT WITH HIM ASAP. REQUESTED THAT MS. FIELDS CALL THIS NURSE BACK TO LET HER KNOW SHE RECEIVE INSTRUCTIONS.

## 2011-10-06 NOTE — Telephone Encounter (Signed)
SENT A COPY OF OFFICE NOTE AND LABS FROM PCP ON 10-03-11 TO [facility] MEDICAL RECORDS TO BE SCANNED INTO PT.'S EMR.

## 2011-10-06 NOTE — Telephone Encounter (Signed)
MS. FIELDS STATES THAT SHE BEGAN WITH VAGINAL BLEEDING  3 WEEKS AGO.  SHE IS PASSING CLOTS.  USING EIGHT PAD A DAY. SHE WENT TO [facility] [phone] WITHIN THE LAST WEEK AND SAW DR. Dorma Russell AVBUERE.  LAB WORK AND OFFICE NOTE  REQUESTED TO BE FAXED  FROM THEIR OFFICE. PT. HAS BEGUN FERROUS SULFATE 325 MG BID. WOULD LIKE AN APPOINTMENT WITH DR. Darrold Span AS SOON AS POSSIBLE.

## 2011-10-11 ENCOUNTER — Other Ambulatory Visit: Payer: Self-pay

## 2011-10-11 DIAGNOSIS — D693 Immune thrombocytopenic purpura: Secondary | ICD-10-CM

## 2011-10-13 ENCOUNTER — Other Ambulatory Visit: Payer: Medicaid Other | Admitting: Lab

## 2011-10-17 ENCOUNTER — Telehealth: Payer: Self-pay | Admitting: Oncology

## 2011-10-17 NOTE — Telephone Encounter (Signed)
called pt and informed her of appt on 01/30

## 2011-10-18 ENCOUNTER — Other Ambulatory Visit: Payer: Medicaid Other | Admitting: Lab

## 2011-11-20 ENCOUNTER — Emergency Department (HOSPITAL_COMMUNITY)
Admission: EM | Admit: 2011-11-20 | Discharge: 2011-11-20 | Disposition: A | Discharge status: Home / Self Care | Payer: Medicaid Other | Attending: Emergency Medicine | Admitting: Emergency Medicine

## 2011-11-20 ENCOUNTER — Other Ambulatory Visit: Payer: Self-pay

## 2011-11-20 ENCOUNTER — Encounter (HOSPITAL_COMMUNITY): Payer: Self-pay | Admitting: *Deleted

## 2011-11-20 DIAGNOSIS — M329 Systemic lupus erythematosus, unspecified: Secondary | ICD-10-CM | POA: Insufficient documentation

## 2011-11-20 DIAGNOSIS — J039 Acute tonsillitis, unspecified: Secondary | ICD-10-CM

## 2011-11-20 MED ORDER — PREDNISONE 20 MG PO TABS
60.0000 mg | ORAL_TABLET | Freq: Once | ORAL | Status: AC
  Administered 2011-11-20: 60 mg via ORAL
  Filled 2011-11-20: qty 3

## 2011-11-20 MED ORDER — FAMOTIDINE 20 MG PO TABS
20.0000 mg | ORAL_TABLET | Freq: Once | ORAL | Status: AC
  Administered 2011-11-20: 20 mg via ORAL
  Filled 2011-11-20: qty 1

## 2011-11-20 MED ORDER — ALBUTEROL SULFATE HFA 108 (90 BASE) MCG/ACT IN AERS
2.0000 | INHALATION_SPRAY | RESPIRATORY_TRACT | Status: DC | PRN
  Filled 2011-11-20: qty 6.7

## 2011-11-20 MED ORDER — HYDROCODONE-ACETAMINOPHEN 7.5-500 MG/15ML PO SOLN: 15.0000 mL | Freq: Four times a day (QID) | ORAL | Status: AC | PRN

## 2011-11-20 NOTE — ED Notes (Signed)
Respiratory paged

## 2011-11-20 NOTE — ED Notes (Signed)
PA Bowie at bedside  °

## 2011-11-20 NOTE — Discharge Instructions (Signed)
Tonsillitis Tonsils are lumps of lymphoid tissues at the back of the throat. Each tonsil has 20 crevices (crypts). Tonsils help fight nose and throat infections and keep infection from spreading to other parts of the body for the first 18 months of life. Tonsillitis is an infection of the throat that causes the tonsils to become red, tender, and swollen. CAUSES Sudden and, if treated, temporary (acute) tonsillitis is usually caused by infection with streptococcal bacteria. Long lasting (chronic) tonsillitis occurs when the crypts of the tonsils become filled with pieces of food and bacteria, which makes it easy for the tonsils to become constantly infected. SYMPTOMS  Symptoms of tonsillitis include:  A sore throat.   White patches on the tonsils.   Fever.   Tiredness.  DIAGNOSIS Tonsillitis can be diagnosed through a physical exam. Diagnosis can be confirmed with the results of lab tests, including a throat culture. TREATMENT  The goals of tonsillitis treatment include the reduction of the severity and duration of symptoms, prevention of associated conditions, and prevention of disease transmission. Tonsillitis caused by bacteria can be treated with antibiotics. Usually, treatment with antibiotics is started before the cause of the tonsillitis is known. However, if it is determined that the cause is not bacterial, antibiotics will not treat the tonsillitis. If attacks of tonsillitis are severe and frequent, your caregiver may recommend surgery to remove the tonsils (tonsillectomy). HOME CARE INSTRUCTIONS   Rest as much as possible and get plenty of sleep.   Drink plenty of fluids. While the throat is very sore, eat soft foods or liquids, such as sherbet, soups, or instant breakfast drinks.   Eat frozen ice pops.   Older children and adults may gargle with a warm or cold liquid to help soothe the throat. Mix 1 teaspoon of salt in 1 cup of water.   Other family members who also develop a  sore throat or fever should have a medical exam or throat culture.   Only take over-the-counter or prescription medicines for pain, discomfort, or fever as directed by your caregiver.   If you are given antibiotics, take them as directed. Finish them even if you start to feel better.  SEEK MEDICAL CARE IF:   Your baby is older than 3 months with a rectal temperature of 100.5 F (38.1 C) or higher for more than 1 day.   Large, tender lumps develop in your neck.   A rash develops.   Green, yellow-brown, or bloody substance is coughed up.   You are unable to swallow liquids or food for 24 hours.   Your child is unable to swallow food or liquids for 12 hours.  SEEK IMMEDIATE MEDICAL CARE IF:   You develop any new symptoms such as vomiting, severe headache, stiff neck, chest pain, or trouble breathing or swallowing.   You have severe throat pain along with drooling or voice changes.   You have severe pain, unrelieved with recommended medications.   You are unable to fully open the mouth.   You develop redness, swelling, or severe pain anywhere in the neck.   You have a fever.   Your baby is older than 3 months with a rectal temperature of 102 F (38.9 C) or higher.   Your baby is 12 months old or younger with a rectal temperature of 100.4 F (38 C) or higher.  MAKE SURE YOU:   Understand these instructions.   Will watch your condition.   Will get help right away if you are not  watch your condition.   Will get help right away if you are not doing well or get worse.  Document Released: 06/14/2005 Document Revised: 08/24/2011 Document Reviewed: 11/10/2010  ExitCare Patient Information 2012 [employer].

## 2011-11-20 NOTE — ED Notes (Signed)
Pt went to [facility] because she felt as if her throat was closing. Pt states she started on new medication. Pt airway is patent but states it is hard to swollow

## 2011-11-20 NOTE — ED Notes (Signed)
Receiving rn notified

## 2011-11-20 NOTE — ED Provider Notes (Signed)
History     CSN: [id number]  Arrival date & time 11/20/11  1306   First MD Initiated Contact with Patient 11/20/11 1315      Chief Complaint  Patient presents with  . Angioedema    (Consider location/radiation/quality/duration/timing/severity/associated sxs/prior treatment) HPI  46 year old female with history of lupus presenting to the ED with chief complaints of throat swelling. Patient states for the past 2 days she has been experiencing tingling sensation around the throat with sensation that her tonsils has enlarge. States throat is very sore, worsening with swallowing. She denies fever, headache, cough, chest pain. states she always has shortness of breath but this is chronic. She denies headaches, sneezing, runny nose, or itchy eyes. Sts with a history of Lupus she usually had oral lesions with flare. Last lupus flare was 2 years ago. She is currently taking prednisone for her lupus. Patient also recently taking Lunesta for the past several days due to having trouble sleeping. Patient denies taking an ACE inhibitor. She denies any other medication changes.  Past Medical History  Diagnosis Date  . Lupus     History reviewed. No pertinent past surgical history.  No family history on file.  History  Substance Use Topics  . Smoking status: Current Some Day Smoker  . Smokeless tobacco: Not on file  . Alcohol Use: Yes    OB History    Grav Para Term Preterm Abortions TAB SAB Ect Mult Living                  Review of Systems  All other systems reviewed and are negative.    Allergies  Review of patient's allergies indicates no known allergies.  Home Medications  No current outpatient prescriptions on file.  BP 140/79  Pulse 79  Temp 98 F (36.7 C)  Resp 20  Ht 5\' 4"  (1.626 m)  Wt 189 lb (85.73 kg)  BMI 32.44 kg/m2  SpO2 100%  LMP 11/03/2011  Physical Exam  Nursing note and vitals reviewed. Constitutional: She appears well-developed and well-nourished.  No distress.       Awake, alert, nontoxic appearance  HENT:  Head: Atraumatic.  Right Ear: External ear normal.  Left Ear: External ear normal.  Nose: Nose normal.  Mouth/Throat: Oropharynx is clear and moist. No oropharyngeal exudate.       Enlarged tonsil bilaterally with midline uvula. No evidence of tongue swelling, oral mucosal swelling or lip swelling. No exudates noted.  Eyes: Conjunctivae are normal. Right eye exhibits no discharge. Left eye exhibits no discharge.  Neck: Neck supple.  Cardiovascular: Normal rate and regular rhythm.   Pulmonary/Chest: Effort normal. No respiratory distress. She exhibits no tenderness.  Abdominal: Soft. There is no tenderness. There is no rebound.  Musculoskeletal: She exhibits no tenderness.       ROM appears intact, no obvious focal weakness  Neurological:       Mental status and motor strength appears intact  Skin: No rash noted.  Psychiatric: She has a normal mood and affect.    ED Course  Procedures (including critical care time)  Labs Reviewed - No data to display No results found.   No diagnosis found.  Results for orders placed during the hospital encounter of 11/20/11  RAPID STREP SCREEN      Component Value Range   Streptococcus, Group A Screen (Direct) NEGATIVE  NEGATIVE    No results found.    MDM  Patients with bilateral tonsillar enlargement with no evidence of peritonsillar abscess  or Ludwig's angina. She has no evidence of angioedema.  She is in no acute respiratory distress. She is able to speak in complete sentences. No evidence of trismus. No oral lesions noted.  2:37 PM Pt's sxs likely tonsilitis of viral origin and less likely drug reaction.  No abdominal pain, low suspicion for mono.  Pt were given albuterol HFA in ED.  Prednisone given.  Will discharge with f/u instruction.  I do encourage avoiding Lunesta until f/u with her PCP.  My attending has also seen and evaluate pt and agrees with plan.    2:49  PM Strep negative.  Steroid given.  Pt sts she's ready to be discharge.  Strict f/u instruction given.    Fayrene Helper, PA-C 11/20/11 1451  Fayrene Helper, PA-C 11/20/11 1456

## 2011-11-20 NOTE — ED Provider Notes (Signed)
Medical screening examination/treatment/procedure(s) were conducted as a shared visit with non-physician practitioner(s) and myself.  I personally evaluated the patient during the encounter   Carianne Taira A. Patrica Duel, MD 11/20/11 1616

## 2012-02-15 ENCOUNTER — Telehealth: Payer: Self-pay | Admitting: *Deleted

## 2012-02-15 NOTE — Telephone Encounter (Signed)
Dana Kennedy called distressed that she has not received a call for an appt.  Reports having small bruises on her ankles and lower legs above ankles with swelling.  Has been instructed by Doctors at [facility] to contact Dr. Darrold Span.  Bruising actually started a month ago and has been going to [facility].  Reports not happy with [facility] and needs a new Doctor to help manage her Lupus.  Seen here annually for ITP, autoimmune hemolytic anemia.  Next f/u pending for Oct. 2013.

## 2012-02-16 ENCOUNTER — Other Ambulatory Visit: Payer: Self-pay

## 2012-02-16 DIAGNOSIS — D693 Immune thrombocytopenic purpura: Secondary | ICD-10-CM

## 2012-02-16 NOTE — Progress Notes (Signed)
Spoke with Ms. Fields and made appt. with physician assistant Tiana Loft PA-C per Dr. Darrold Span for 02-23-12 at 1000.  Pt. Verbalized understanding.

## 2012-02-19 ENCOUNTER — Telehealth: Payer: Self-pay

## 2012-02-19 NOTE — Telephone Encounter (Signed)
ENCOUNTER OPENED IN ERROR

## 2012-02-23 ENCOUNTER — Ambulatory Visit: Payer: Medicaid Other | Admitting: Physician Assistant

## 2012-02-23 ENCOUNTER — Other Ambulatory Visit: Payer: Medicaid Other

## 2012-05-29 ENCOUNTER — Other Ambulatory Visit (HOSPITAL_COMMUNITY): Payer: Self-pay | Admitting: Orthopaedic Surgery

## 2012-06-05 ENCOUNTER — Encounter (HOSPITAL_COMMUNITY): Payer: Self-pay | Admitting: Pharmacy Technician

## 2012-06-10 ENCOUNTER — Encounter (HOSPITAL_COMMUNITY): Payer: Self-pay | Admitting: *Deleted

## 2012-06-10 ENCOUNTER — Inpatient Hospital Stay (HOSPITAL_COMMUNITY): Admission: RE | Admit: 2012-06-10 | Payer: Medicaid Other | Source: Ambulatory Visit

## 2012-06-12 ENCOUNTER — Ambulatory Visit (HOSPITAL_COMMUNITY)
Admission: RE | Admit: 2012-06-12 | Discharge: 2012-06-12 | Disposition: A | Discharge status: Home / Self Care | Payer: Medicaid Other | Source: Ambulatory Visit | Attending: Orthopaedic Surgery | Admitting: Orthopaedic Surgery

## 2012-06-12 ENCOUNTER — Encounter (HOSPITAL_COMMUNITY)
Admission: RE | Admit: 2012-06-12 | Discharge: 2012-06-12 | Disposition: A | Discharge status: Home / Self Care | Payer: Medicaid Other | Source: Ambulatory Visit | Attending: Orthopaedic Surgery | Admitting: Orthopaedic Surgery

## 2012-06-12 DIAGNOSIS — Z01812 Encounter for preprocedural laboratory examination: Secondary | ICD-10-CM | POA: Insufficient documentation

## 2012-06-12 DIAGNOSIS — Z01818 Encounter for other preprocedural examination: Secondary | ICD-10-CM | POA: Insufficient documentation

## 2012-06-12 LAB — BASIC METABOLIC PANEL
BUN: 12 mg/dL (ref 6–23)
Calcium: 9.4 mg/dL (ref 8.4–10.5)
GFR calc Af Amer: >90 mL/min (ref >90)
GFR calc non Af Amer: >90 mL/min (ref >90)
Potassium: 3.9 mEq/L (ref 3.5–5.1)
Sodium: 139 mEq/L (ref 135–145)

## 2012-06-12 LAB — URINALYSIS, ROUTINE W REFLEX MICROSCOPIC
Glucose, UA: NEGATIVE mg/dL
Nitrite: NEGATIVE
Protein, ur: NEGATIVE mg/dL

## 2012-06-12 LAB — URINE MICROSCOPIC-ADD ON

## 2012-06-12 LAB — CBC
Hemoglobin: 12.9 g/dL (ref 12.0–15.0)
MCH: 26.3 pg (ref 26.0–34.0)
MCHC: 32.9 g/dL (ref 30.0–36.0)
Platelets: 124 10*3/uL — ABNORMAL LOW (ref 150–400)
RBC: 4.9 MIL/uL (ref 3.87–5.11)

## 2012-06-12 LAB — APTT: aPTT: 31 seconds (ref 24–37)

## 2012-06-12 LAB — PROTIME-INR: INR: 0.94 (ref 0.00–1.49)

## 2012-06-12 LAB — ABO/RH: ABO/RH(D): A POS

## 2012-06-12 NOTE — Progress Notes (Addendum)
Micro, ua, and cbc results faxed to dr c blackman via epic

## 2012-06-12 NOTE — Progress Notes (Signed)
ekg 11-20-2011 epic

## 2012-06-13 ENCOUNTER — Telehealth: Payer: Self-pay | Admitting: *Deleted

## 2012-06-13 NOTE — Telephone Encounter (Signed)
Left voice message to inform the patient of the new date and time 

## 2012-06-14 ENCOUNTER — Ambulatory Visit (HOSPITAL_COMMUNITY): Payer: Medicaid Other

## 2012-06-14 ENCOUNTER — Encounter (HOSPITAL_COMMUNITY): Payer: Self-pay | Admitting: *Deleted

## 2012-06-14 ENCOUNTER — Encounter (HOSPITAL_COMMUNITY): Payer: Self-pay | Admitting: Anesthesiology

## 2012-06-14 ENCOUNTER — Ambulatory Visit (HOSPITAL_COMMUNITY): Payer: Medicaid Other | Admitting: Anesthesiology

## 2012-06-14 ENCOUNTER — Encounter (HOSPITAL_COMMUNITY)
Admission: RE | Disposition: A | Discharge status: Skilled Nursing Facility | Payer: Self-pay | Source: Ambulatory Visit | Attending: Orthopaedic Surgery

## 2012-06-14 ENCOUNTER — Inpatient Hospital Stay (HOSPITAL_COMMUNITY)
Admission: RE | Admit: 2012-06-14 | Discharge: 2012-06-17 | DRG: 470 | Disposition: A | Discharge status: Skilled Nursing Facility | Payer: Medicaid Other | Source: Ambulatory Visit | Attending: Orthopaedic Surgery | Admitting: Orthopaedic Surgery

## 2012-06-14 ENCOUNTER — Encounter (HOSPITAL_COMMUNITY): Payer: Self-pay | Admitting: Orthopedic Surgery

## 2012-06-14 DIAGNOSIS — F17200 Nicotine dependence, unspecified, uncomplicated: Secondary | ICD-10-CM | POA: Diagnosis present

## 2012-06-14 DIAGNOSIS — M329 Systemic lupus erythematosus, unspecified: Secondary | ICD-10-CM | POA: Diagnosis present

## 2012-06-14 DIAGNOSIS — K219 Gastro-esophageal reflux disease without esophagitis: Secondary | ICD-10-CM | POA: Diagnosis present

## 2012-06-14 DIAGNOSIS — J45909 Unspecified asthma, uncomplicated: Secondary | ICD-10-CM | POA: Diagnosis present

## 2012-06-14 DIAGNOSIS — D62 Acute posthemorrhagic anemia: Secondary | ICD-10-CM | POA: Diagnosis not present

## 2012-06-14 DIAGNOSIS — M129 Arthropathy, unspecified: Secondary | ICD-10-CM | POA: Diagnosis present

## 2012-06-14 DIAGNOSIS — M87059 Idiopathic aseptic necrosis of unspecified femur: Principal | ICD-10-CM | POA: Diagnosis present

## 2012-06-14 DIAGNOSIS — Z79899 Other long term (current) drug therapy: Secondary | ICD-10-CM

## 2012-06-14 DIAGNOSIS — M25559 Pain in unspecified hip: Secondary | ICD-10-CM | POA: Diagnosis present

## 2012-06-14 HISTORY — DX: Anemia, unspecified: D64.9

## 2012-06-14 HISTORY — DX: Unspecified asthma, uncomplicated: J45.909

## 2012-06-14 HISTORY — DX: Gastro-esophageal reflux disease without esophagitis: K21.9

## 2012-06-14 HISTORY — PX: TOTAL HIP ARTHROPLASTY: SHX124

## 2012-06-14 LAB — TYPE AND SCREEN
ABO/RH(D): A POS
Antibody Screen: NEGATIVE

## 2012-06-14 SURGERY — ARTHROPLASTY, HIP, TOTAL, ANTERIOR APPROACH
Anesthesia: General | Site: Hip | Laterality: Left | Wound class: Clean

## 2012-06-14 MED ORDER — DIPHENHYDRAMINE HCL 50 MG/ML IJ SOLN: 12.5000 mg | Freq: Four times a day (QID) | INTRAMUSCULAR | Status: DC | PRN

## 2012-06-14 MED ORDER — MENTHOL 3 MG MT LOZG: 1.0000 | LOZENGE | OROMUCOSAL | Status: DC | PRN

## 2012-06-14 MED ORDER — FENTANYL CITRATE 0.05 MG/ML IJ SOLN
100.0000 ug | Freq: Once | INTRAMUSCULAR | Status: AC
  Administered 2012-06-14: 100 ug via INTRAVENOUS

## 2012-06-14 MED ORDER — PROMETHAZINE HCL 25 MG/ML IJ SOLN: 6.2500 mg | INTRAMUSCULAR | Status: DC | PRN

## 2012-06-14 MED ORDER — HYDROMORPHONE HCL PF 1 MG/ML IJ SOLN
INTRAMUSCULAR | Status: AC
  Filled 2012-06-14: qty 1

## 2012-06-14 MED ORDER — MORPHINE SULFATE (PF) 1 MG/ML IV SOLN
INTRAVENOUS | Status: DC
  Administered 2012-06-14: 4.5 mg via INTRAVENOUS
  Administered 2012-06-14: 13:00:00 via INTRAVENOUS
  Administered 2012-06-15 (×3): 4.5 mg via INTRAVENOUS
  Administered 2012-06-15: 8.95 mg via INTRAVENOUS
  Filled 2012-06-14: qty 25

## 2012-06-14 MED ORDER — ALBUTEROL SULFATE HFA 108 (90 BASE) MCG/ACT IN AERS
2.0000 | INHALATION_SPRAY | Freq: Four times a day (QID) | RESPIRATORY_TRACT | Status: DC | PRN
  Filled 2012-06-14 (×2): qty 6.7

## 2012-06-14 MED ORDER — METHOCARBAMOL 500 MG PO TABS
500.0000 mg | ORAL_TABLET | Freq: Four times a day (QID) | ORAL | Status: DC | PRN
  Administered 2012-06-14 – 2012-06-17 (×9): 500 mg via ORAL
  Filled 2012-06-14 (×9): qty 1

## 2012-06-14 MED ORDER — GLYCOPYRROLATE 0.2 MG/ML IJ SOLN
INTRAMUSCULAR | Status: DC | PRN
  Administered 2012-06-14: 0.3 mg via INTRAVENOUS

## 2012-06-14 MED ORDER — ACETAMINOPHEN 10 MG/ML IV SOLN
INTRAVENOUS | Status: AC
  Filled 2012-06-14: qty 100

## 2012-06-14 MED ORDER — KETOROLAC TROMETHAMINE 30 MG/ML IJ SOLN: 15.0000 mg | Freq: Once | INTRAMUSCULAR | Status: DC | PRN

## 2012-06-14 MED ORDER — NALOXONE HCL 0.4 MG/ML IJ SOLN: 0.4000 mg | INTRAMUSCULAR | Status: DC | PRN

## 2012-06-14 MED ORDER — MORPHINE SULFATE (PF) 1 MG/ML IV SOLN
INTRAVENOUS | Status: AC
  Administered 2012-06-14: 3 mg via INTRAVENOUS
  Filled 2012-06-14: qty 25

## 2012-06-14 MED ORDER — NEOSTIGMINE METHYLSULFATE 1 MG/ML IJ SOLN
INTRAMUSCULAR | Status: DC | PRN
  Administered 2012-06-14: 2 mg via INTRAVENOUS

## 2012-06-14 MED ORDER — PROPOFOL 10 MG/ML IV BOLUS
INTRAVENOUS | Status: DC | PRN
  Administered 2012-06-14: 150 mg via INTRAVENOUS

## 2012-06-14 MED ORDER — DIPHENHYDRAMINE HCL 12.5 MG/5ML PO ELIX
12.5000 mg | ORAL_SOLUTION | ORAL | Status: DC | PRN
  Administered 2012-06-16: 12.5 mg via ORAL
  Administered 2012-06-16: 25 mg via ORAL
  Filled 2012-06-14: qty 5
  Filled 2012-06-14: qty 10

## 2012-06-14 MED ORDER — ACETAMINOPHEN 10 MG/ML IV SOLN
INTRAVENOUS | Status: DC | PRN
  Administered 2012-06-14: 1000 mg via INTRAVENOUS

## 2012-06-14 MED ORDER — DIPHENHYDRAMINE HCL 12.5 MG/5ML PO ELIX: 12.5000 mg | ORAL_SOLUTION | Freq: Four times a day (QID) | ORAL | Status: DC | PRN

## 2012-06-14 MED ORDER — DOCUSATE SODIUM 100 MG PO CAPS
100.0000 mg | ORAL_CAPSULE | Freq: Two times a day (BID) | ORAL | Status: DC
  Administered 2012-06-14 – 2012-06-17 (×7): 100 mg via ORAL

## 2012-06-14 MED ORDER — DEXAMETHASONE SODIUM PHOSPHATE 10 MG/ML IJ SOLN
INTRAMUSCULAR | Status: DC | PRN
  Administered 2012-06-14: 10 mg via INTRAVENOUS

## 2012-06-14 MED ORDER — LIDOCAINE HCL (CARDIAC) 20 MG/ML IV SOLN
INTRAVENOUS | Status: DC | PRN
  Administered 2012-06-14: 100 mg via INTRAVENOUS

## 2012-06-14 MED ORDER — ASPIRIN EC 325 MG PO TBEC
325.0000 mg | DELAYED_RELEASE_TABLET | Freq: Two times a day (BID) | ORAL | Status: DC
  Administered 2012-06-15 – 2012-06-17 (×5): 325 mg via ORAL
  Filled 2012-06-14 (×6): qty 1

## 2012-06-14 MED ORDER — ONDANSETRON HCL 4 MG/2ML IJ SOLN: 4.0000 mg | Freq: Four times a day (QID) | INTRAMUSCULAR | Status: DC | PRN

## 2012-06-14 MED ORDER — 0.9 % SODIUM CHLORIDE (POUR BTL) OPTIME
TOPICAL | Status: DC | PRN
  Administered 2012-06-14: 1000 mL

## 2012-06-14 MED ORDER — FERROUS SULFATE 325 (65 FE) MG PO TABS
325.0000 mg | ORAL_TABLET | Freq: Three times a day (TID) | ORAL | Status: DC
  Administered 2012-06-14 – 2012-06-17 (×10): 325 mg via ORAL
  Filled 2012-06-14 (×12): qty 1

## 2012-06-14 MED ORDER — CEFAZOLIN SODIUM-DEXTROSE 2-3 GM-% IV SOLR
2.0000 g | INTRAVENOUS | Status: AC
  Administered 2012-06-14: 2 g via INTRAVENOUS

## 2012-06-14 MED ORDER — ACETAMINOPHEN 325 MG PO TABS: 650.0000 mg | ORAL_TABLET | Freq: Four times a day (QID) | ORAL | Status: DC | PRN

## 2012-06-14 MED ORDER — ACETAMINOPHEN 650 MG RE SUPP: 650.0000 mg | Freq: Four times a day (QID) | RECTAL | Status: DC | PRN

## 2012-06-14 MED ORDER — LORATADINE 10 MG PO TABS
10.0000 mg | ORAL_TABLET | Freq: Every day | ORAL | Status: DC
  Administered 2012-06-14 – 2012-06-17 (×4): 10 mg via ORAL
  Filled 2012-06-14 (×4): qty 1

## 2012-06-14 MED ORDER — KETOROLAC TROMETHAMINE 15 MG/ML IJ SOLN
15.0000 mg | Freq: Four times a day (QID) | INTRAMUSCULAR | Status: AC
  Administered 2012-06-14 – 2012-06-15 (×4): 15 mg via INTRAVENOUS
  Filled 2012-06-14 (×4): qty 1

## 2012-06-14 MED ORDER — MUPIROCIN 2 % EX OINT
TOPICAL_OINTMENT | Freq: Two times a day (BID) | CUTANEOUS | Status: DC
  Administered 2012-06-14: 1 via NASAL

## 2012-06-14 MED ORDER — MORPHINE SULFATE 10 MG/ML IJ SOLN: 2.0000 mg | INTRAMUSCULAR | Status: DC | PRN

## 2012-06-14 MED ORDER — SODIUM CHLORIDE 0.9 % IJ SOLN: 9.0000 mL | INTRAMUSCULAR | Status: DC | PRN

## 2012-06-14 MED ORDER — METHOCARBAMOL 100 MG/ML IJ SOLN
500.0000 mg | Freq: Four times a day (QID) | INTRAMUSCULAR | Status: DC | PRN
  Administered 2012-06-14: 500 mg via INTRAVENOUS
  Filled 2012-06-14: qty 5

## 2012-06-14 MED ORDER — OXYCODONE HCL 5 MG PO TABS
5.0000 mg | ORAL_TABLET | ORAL | Status: DC | PRN
  Administered 2012-06-15 – 2012-06-17 (×12): 10 mg via ORAL
  Filled 2012-06-14 (×12): qty 2

## 2012-06-14 MED ORDER — MIDAZOLAM HCL 5 MG/5ML IJ SOLN
INTRAMUSCULAR | Status: DC | PRN
  Administered 2012-06-14 (×2): 2 mg via INTRAVENOUS

## 2012-06-14 MED ORDER — ZOLPIDEM TARTRATE 5 MG PO TABS: 5.0000 mg | ORAL_TABLET | Freq: Every evening | ORAL | Status: DC | PRN

## 2012-06-14 MED ORDER — ALBUTEROL SULFATE HFA 108 (90 BASE) MCG/ACT IN AERS
2.0000 | INHALATION_SPRAY | RESPIRATORY_TRACT | Status: DC | PRN
  Administered 2012-06-14: 2 via RESPIRATORY_TRACT
  Filled 2012-06-14: qty 6.7

## 2012-06-14 MED ORDER — ALBUTEROL SULFATE HFA 108 (90 BASE) MCG/ACT IN AERS
INHALATION_SPRAY | RESPIRATORY_TRACT | Status: AC
  Filled 2012-06-14: qty 6.7

## 2012-06-14 MED ORDER — PANTOPRAZOLE SODIUM 40 MG PO TBEC
40.0000 mg | DELAYED_RELEASE_TABLET | Freq: Every morning | ORAL | Status: DC
  Administered 2012-06-14 – 2012-06-17 (×4): 40 mg via ORAL
  Filled 2012-06-14 (×4): qty 1

## 2012-06-14 MED ORDER — CEFAZOLIN SODIUM-DEXTROSE 2-3 GM-% IV SOLR
INTRAVENOUS | Status: AC
  Filled 2012-06-14: qty 50

## 2012-06-14 MED ORDER — SUFENTANIL CITRATE 50 MCG/ML IV SOLN
INTRAVENOUS | Status: DC | PRN
  Administered 2012-06-14 (×7): 5 ug via INTRAVENOUS
  Administered 2012-06-14: 10 ug via INTRAVENOUS
  Administered 2012-06-14: 5 ug via INTRAVENOUS

## 2012-06-14 MED ORDER — ONDANSETRON HCL 4 MG/2ML IJ SOLN
INTRAMUSCULAR | Status: DC | PRN
  Administered 2012-06-14: 4 mg via INTRAVENOUS

## 2012-06-14 MED ORDER — CEFAZOLIN SODIUM 1-5 GM-% IV SOLN
1.0000 g | Freq: Four times a day (QID) | INTRAVENOUS | Status: AC
  Administered 2012-06-14 (×2): 1 g via INTRAVENOUS
  Filled 2012-06-14 (×2): qty 50

## 2012-06-14 MED ORDER — METOCLOPRAMIDE HCL 5 MG/ML IJ SOLN
5.0000 mg | Freq: Three times a day (TID) | INTRAMUSCULAR | Status: DC | PRN
  Administered 2012-06-14: 10 mg via INTRAVENOUS
  Filled 2012-06-14: qty 2

## 2012-06-14 MED ORDER — PREDNISONE 20 MG PO TABS
20.0000 mg | ORAL_TABLET | Freq: Two times a day (BID) | ORAL | Status: DC
  Administered 2012-06-14 – 2012-06-17 (×7): 20 mg via ORAL
  Filled 2012-06-14 (×8): qty 1

## 2012-06-14 MED ORDER — FENTANYL CITRATE 0.05 MG/ML IJ SOLN
INTRAMUSCULAR | Status: AC
  Filled 2012-06-14: qty 2

## 2012-06-14 MED ORDER — NICOTINE 21 MG/24HR TD PT24
21.0000 mg | MEDICATED_PATCH | Freq: Every day | TRANSDERMAL | Status: DC
  Administered 2012-06-14 – 2012-06-17 (×4): 21 mg via TRANSDERMAL
  Filled 2012-06-14 (×4): qty 1

## 2012-06-14 MED ORDER — PHENOL 1.4 % MT LIQD: 1.0000 | OROMUCOSAL | Status: DC | PRN

## 2012-06-14 MED ORDER — ONDANSETRON HCL 4 MG PO TABS: 4.0000 mg | ORAL_TABLET | Freq: Four times a day (QID) | ORAL | Status: DC | PRN

## 2012-06-14 MED ORDER — FOLIC ACID 1 MG PO TABS
1.0000 mg | ORAL_TABLET | Freq: Every day | ORAL | Status: DC
  Administered 2012-06-14 – 2012-06-17 (×4): 1 mg via ORAL
  Filled 2012-06-14 (×4): qty 1

## 2012-06-14 MED ORDER — METOCLOPRAMIDE HCL 5 MG PO TABS
5.0000 mg | ORAL_TABLET | Freq: Three times a day (TID) | ORAL | Status: DC | PRN
  Filled 2012-06-14: qty 2

## 2012-06-14 MED ORDER — SODIUM CHLORIDE 0.9 % IV SOLN
INTRAVENOUS | Status: DC
  Administered 2012-06-14: 75 mL/h via INTRAVENOUS
  Administered 2012-06-15: 05:00:00 via INTRAVENOUS

## 2012-06-14 MED ORDER — ALUM & MAG HYDROXIDE-SIMETH 200-200-20 MG/5ML PO SUSP: 30.0000 mL | ORAL | Status: DC | PRN

## 2012-06-14 MED ORDER — MUPIROCIN 2 % EX OINT
TOPICAL_OINTMENT | CUTANEOUS | Status: AC
  Filled 2012-06-14: qty 22

## 2012-06-14 MED ORDER — ROCURONIUM BROMIDE 100 MG/10ML IV SOLN
INTRAVENOUS | Status: DC | PRN
  Administered 2012-06-14: 40 mg via INTRAVENOUS

## 2012-06-14 MED ORDER — LACTATED RINGERS IV SOLN
INTRAVENOUS | Status: DC
  Administered 2012-06-14: 1000 mL via INTRAVENOUS

## 2012-06-14 MED ORDER — HYDROMORPHONE HCL PF 1 MG/ML IJ SOLN
0.2500 mg | INTRAMUSCULAR | Status: DC | PRN
  Administered 2012-06-14 (×2): 0.5 mg via INTRAVENOUS

## 2012-06-14 SURGICAL SUPPLY — 38 items
BAG SPEC THK2 15X12 ZIP CLS (MISCELLANEOUS) ×2
BAG ZIPLOCK 12X15 (MISCELLANEOUS) ×4 IMPLANT
BLADE SAW SGTL 18X1.27X75 (BLADE) ×2 IMPLANT
CLOTH BEACON ORANGE TIMEOUT ST (SAFETY) ×2 IMPLANT
DRAPE C-ARM 42X72 X-RAY (DRAPES) ×2 IMPLANT
DRAPE STERI IOBAN 125X83 (DRAPES) ×2 IMPLANT
DRAPE U-SHAPE 47X51 STRL (DRAPES) ×6 IMPLANT
DRSG MEPILEX BORDER 4X8 (GAUZE/BANDAGES/DRESSINGS) ×2 IMPLANT
DURAPREP 26ML APPLICATOR (WOUND CARE) ×2 IMPLANT
ELECT BLADE TIP CTD 4 INCH (ELECTRODE) ×2 IMPLANT
ELECT REM PT RETURN 9FT ADLT (ELECTROSURGICAL) ×2
ELECTRODE REM PT RTRN 9FT ADLT (ELECTROSURGICAL) ×1 IMPLANT
ELIMINATOR HOLE APEX DEPUY (Hips) IMPLANT
FACESHIELD LNG OPTICON STERILE (SAFETY) ×8 IMPLANT
GAUZE XEROFORM 1X8 LF (GAUZE/BANDAGES/DRESSINGS) ×2 IMPLANT
GLOVE BIO SURGEON STRL SZ7 (GLOVE) ×1 IMPLANT
GLOVE BIO SURGEON STRL SZ7.5 (GLOVE) ×3 IMPLANT
GLOVE BIOGEL PI IND STRL 7.5 (GLOVE) IMPLANT
GLOVE BIOGEL PI IND STRL 8 (GLOVE) ×1 IMPLANT
GLOVE BIOGEL PI INDICATOR 7.5 (GLOVE)
GLOVE BIOGEL PI INDICATOR 8 (GLOVE) ×1
GLOVE ECLIPSE 7.0 STRL STRAW (GLOVE) ×1 IMPLANT
GLOVE SURG SS PI 6.5 STRL IVOR (GLOVE) ×3 IMPLANT
GLOVE SURG SS PI 7.5 STRL IVOR (GLOVE) ×2 IMPLANT
GOWN STRL NON-REIN LRG LVL3 (GOWN DISPOSABLE) ×1 IMPLANT
GOWN STRL REIN XL XLG (GOWN DISPOSABLE) ×4 IMPLANT
KIT BASIN OR (CUSTOM PROCEDURE TRAY) ×2 IMPLANT
PACK TOTAL JOINT (CUSTOM PROCEDURE TRAY) ×2 IMPLANT
PADDING CAST COTTON 6X4 STRL (CAST SUPPLIES) ×2 IMPLANT
STAPLER VISISTAT 35W (STAPLE) ×1 IMPLANT
SUT ETHIBOND NAB CT1 #1 30IN (SUTURE) ×3 IMPLANT
SUT VIC AB 1 CT1 36 (SUTURE) ×3 IMPLANT
SUT VIC AB 2-0 CT1 27 (SUTURE) ×2
SUT VIC AB 2-0 CT1 TAPERPNT 27 (SUTURE) ×2 IMPLANT
SUT VLOC 180 0 24IN GS25 (SUTURE) ×1 IMPLANT
TOWEL OR 17X26 10 PK STRL BLUE (TOWEL DISPOSABLE) ×4 IMPLANT
TOWEL OR NON WOVEN STRL DISP B (DISPOSABLE) ×2 IMPLANT
TRAY FOLEY CATH 14FRSI W/METER (CATHETERS) ×2 IMPLANT

## 2012-06-14 NOTE — Anesthesia Postprocedure Evaluation (Signed)
  Anesthesia Post-op Note  Patient: Dana Kennedy  Procedure(s) Performed: Procedure(s) (LRB): TOTAL HIP ARTHROPLASTY ANTERIOR APPROACH (Left)  Patient Location: PACU  Anesthesia Type: General  Level of Consciousness: awake and alert   Airway and Oxygen Therapy: Patient Spontanous Breathing  Post-op Pain: mild  Post-op Assessment: Post-op Vital signs reviewed, Patient's Cardiovascular Status Stable, Respiratory Function Stable, Patent Airway and No signs of Nausea or vomiting  Post-op Vital Signs: stable  Complications: No apparent anesthesia complications

## 2012-06-14 NOTE — Anesthesia Preprocedure Evaluation (Signed)
Anesthesia Evaluation  Patient identified by MRN, date of birth, ID band Patient awake  General Assessment Comment:Steroids this month  Reviewed: Allergy & Precautions, H&P , NPO status , Patient's Chart, lab work & pertinent test results  Airway Mallampati: II TM Distance: <3 FB Neck ROM: Full    Dental No notable dental hx. (+) Dental Advisory Given   Pulmonary asthma , Current Smoker,  breath sounds clear to auscultation  Pulmonary exam normal       Cardiovascular negative cardio ROS  Rhythm:Regular Rate:Normal     Neuro/Psych negative neurological ROS  negative psych ROS   GI/Hepatic Neg liver ROS, GERD-  Medicated,  Endo/Other  negative endocrine ROSSLE  Renal/GU   negative genitourinary   Musculoskeletal negative musculoskeletal ROS (+)   Abdominal   Peds negative pediatric ROS (+)  Hematology negative hematology ROS (+)   Anesthesia Other Findings   Reproductive/Obstetrics negative OB ROS                           Anesthesia Physical Anesthesia Plan  ASA: III  Anesthesia Plan: General   Post-op Pain Management:    Induction: Intravenous  Airway Management Planned: Oral ETT  Additional Equipment:   Intra-op Plan:   Post-operative Plan: Extubation in OR  Informed Consent: I have reviewed the patients History and Physical, chart, labs and discussed the procedure including the risks, benefits and alternatives for the proposed anesthesia with the patient or authorized representative who has indicated his/her understanding and acceptance.   Dental advisory given  Plan Discussed with: CRNA and Surgeon  Anesthesia Plan Comments:         Anesthesia Quick Evaluation

## 2012-06-14 NOTE — Transfer of Care (Signed)
Immediate Anesthesia Transfer of Care Note  Patient: Dana Kennedy  Procedure(s) Performed: Procedure(s) (LRB) with comments: TOTAL HIP ARTHROPLASTY ANTERIOR APPROACH (Left) - Left Total Hip Arthroplasty, Anterior Approach C-Arm  Patient Location: PACU  Anesthesia Type: General  Level of Consciousness: awake, alert  and oriented  Airway & Oxygen Therapy: Patient Spontanous Breathing and Patient connected to face mask oxygen  Post-op Assessment: Report given to PACU RN and Post -op Vital signs reviewed and stable  Post vital signs: Reviewed and stable  Complications: No apparent anesthesia complications

## 2012-06-14 NOTE — H&P (Signed)
Dana Kennedy is an 46 y.o. female.   Chief Complaint:   Severe left hip pain HPI:   46 yo female with worsening left hip pain for sometime now.  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  Given her pain and poor mobility, she wishes to proceed with a left total hip replacement at this point.  The goals are improved mobility and decreased pain.  The risks are blood loss, nerve injury, fracture, infection and DVT.  Past Medical History  Diagnosis Date  . Lupus   . Asthma   . GERD (gastroesophageal reflux disease)   . Arthritis   . Anemia     Past Surgical History  Procedure Date  . Stomach ulcer surgery 2008    History reviewed. No pertinent family history. Social History:  reports that she has been smoking Cigarettes.  She has a 7.5 pack-year smoking history. She has never used smokeless tobacco. She reports that she drinks alcohol. She reports that she does not use illicit drugs.  Allergies:  Allergies  Allergen Reactions  . Eszopiclone Swelling    Felt like throat was closing. Not sure if it was related to this medication or not. lunesta    Medications Prior to Admission  Medication Sig Dispense Refill  . predniSONE (DELTASONE) 20 MG tablet Take 20 mg by mouth 2 (two) times daily.      Marland Kitchen albuterol (PROVENTIL HFA;VENTOLIN HFA) 108 (90 BASE) MCG/ACT inhaler Inhale 2 puffs into the lungs every 6 (six) hours as needed. For shortness of breath      . bismuth subsalicylate (PEPTO BISMOL) 262 MG chewable tablet Chew 524 mg by mouth as needed. For upset stomach      . cetirizine (ZYRTEC) 10 MG tablet Take 10 mg by mouth every evening.       . ferrous sulfate 325 (65 FE) MG tablet Take 325 mg by mouth daily with breakfast.      . folic acid (FOLVITE) 1 MG tablet Take 1 mg by mouth daily.      . meloxicam (MOBIC) 15 MG tablet Take 15 mg by mouth daily.      . methocarbamol (ROBAXIN) 500 MG tablet Take 500 mg by mouth 4  (four) times daily.      . pantoprazole (PROTONIX) 40 MG tablet Take 40 mg by mouth every morning.       . zolpidem (AMBIEN) 5 MG tablet Take 5 mg by mouth at bedtime as needed. For sleep        Results for orders placed during the hospital encounter of 06/12/12 (from the past 48 hour(s))  SURGICAL PCR SCREEN     Status: Abnormal   Collection Time   06/12/12  8:30 AM      Component Value Range Comment   MRSA, PCR NEGATIVE  NEGATIVE    Staphylococcus aureus POSITIVE (*) NEGATIVE   BASIC METABOLIC PANEL     Status: Normal   Collection Time   06/12/12  8:40 AM      Component Value Range Comment   Sodium 139  135 - 145 mEq/L    Potassium 3.9  3.5 - 5.1 mEq/L    Chloride 101  96 - 112 mEq/L    CO2 27  19 - 32 mEq/L    Glucose, Bld 92  70 - 99 mg/dL    BUN 12  6 - 23 mg/dL    Creatinine, Ser 1.61  0.50 -  1.10 mg/dL    Calcium 9.4  8.4 - 16.1 mg/dL    GFR calc non Af Amer >90  >90 mL/min    GFR calc Af Amer >90  >90 mL/min   CBC     Status: Abnormal   Collection Time   06/12/12  8:40 AM      Component Value Range Comment   WBC 7.1  4.0 - 10.5 K/uL    RBC 4.90  3.87 - 5.11 MIL/uL    Hemoglobin 12.9  12.0 - 15.0 g/dL    HCT 09.6  04.5 - 40.9 %    MCV 80.0  78.0 - 100.0 fL    MCH 26.3  26.0 - 34.0 pg    MCHC 32.9  30.0 - 36.0 g/dL    RDW 81.1  91.4 - 78.2 %    Platelets 124 (*) 150 - 400 K/uL   APTT     Status: Normal   Collection Time   06/12/12  8:40 AM      Component Value Range Comment   aPTT 31  24 - 37 seconds   HCG, SERUM, QUALITATIVE     Status: Normal   Collection Time   06/12/12  8:40 AM      Component Value Range Comment   Preg, Serum NEGATIVE  NEGATIVE   PROTIME-INR     Status: Normal   Collection Time   06/12/12  8:40 AM      Component Value Range Comment   Prothrombin Time 12.5  11.6 - 15.2 seconds    INR 0.94  0.00 - 1.49   TYPE AND SCREEN     Status: Normal   Collection Time   06/12/12  8:40 AM      Component Value Range Comment   ABO/RH(D) A POS       Antibody Screen NEG      Sample Expiration 06/17/2012     URINALYSIS, ROUTINE W REFLEX MICROSCOPIC     Status: Abnormal   Collection Time   06/12/12  8:50 AM      Component Value Range Comment   Color, Urine YELLOW  YELLOW    APPearance CLEAR  CLEAR    Specific Gravity, Urine 1.027  1.005 - 1.030    pH 6.0  5.0 - 8.0    Glucose, UA NEGATIVE  NEGATIVE mg/dL    Hgb urine dipstick SMALL (*) NEGATIVE    Bilirubin Urine NEGATIVE  NEGATIVE    Ketones, ur TRACE (*) NEGATIVE mg/dL    Protein, ur NEGATIVE  NEGATIVE mg/dL    Urobilinogen, UA 1.0  0.0 - 1.0 mg/dL    Nitrite NEGATIVE  NEGATIVE    Leukocytes, UA SMALL (*) NEGATIVE   URINE MICROSCOPIC-ADD ON     Status: Abnormal   Collection Time   06/12/12  8:50 AM      Component Value Range Comment   Squamous Epithelial / LPF RARE  RARE    WBC, UA 3-6  <3 WBC/hpf    RBC / HPF 3-6  <3 RBC/hpf    Bacteria, UA MANY (*) RARE    Urine-Other MUCOUS PRESENT     ABO/RH     Status: Normal   Collection Time   06/12/12  9:00 AM      Component Value Range Comment   ABO/RH(D) A POS      Dg Chest 2 View  06/12/2012  *RADIOLOGY REPORT*  Clinical Data: Preop left total hip arthroplasty  CHEST - 2 VIEW  Comparison: 09/08/2010  Findings: The heart size and mediastinal contours are within normal limits.  Both lungs are clear.  The visualized skeletal structures are unremarkable.  IMPRESSION: Negative exam.   Original Report Authenticated By: Rosealee Albee, M.D.     Review of Systems  All other systems reviewed and are negative.    Blood pressure 135/84, pulse 95, temperature 98.6 F (37 C), resp. rate 20, height 5\' 5"  (1.651 m), weight 78.189 kg (172 lb 6 oz), last menstrual period 05/15/2012, SpO2 99.00%. Physical Exam  Constitutional: She is oriented to person, place, and time. She appears well-developed and well-nourished.  HENT:  Head: Normocephalic and atraumatic.  Eyes: EOM are normal. Pupils are equal, round, and reactive to light.  Neck:  Normal range of motion. Neck supple.  Cardiovascular: Normal rate and regular rhythm.   Respiratory: Effort normal and breath sounds normal.  GI: Soft. Bowel sounds are normal.  Musculoskeletal:       Left hip: She exhibits decreased range of motion, decreased strength and bony tenderness.  Neurological: She is alert and oriented to person, place, and time.  Skin: Skin is warm and dry.  Psychiatric: She has a normal mood and affect.     Assessment/Plan Avascular necrosis left hip 1)  To the OR today for a left total hip replacement  Rashaun Curl Y 06/14/2012, 7:08 AM

## 2012-06-14 NOTE — Brief Op Note (Signed)
06/14/2012  12:13 PM  PATIENT:  Dana Kennedy  46 y.o. female  PRE-OPERATIVE DIAGNOSIS:  avascular necrosis left hip  POST-OPERATIVE DIAGNOSIS:  avascular necrosis left hip  PROCEDURE:  Procedure(s) (LRB) with comments: TOTAL HIP ARTHROPLASTY ANTERIOR APPROACH (Left) - Left Total Hip Arthroplasty, Anterior Approach C-Arm  SURGEON:  Surgeon(s) and Role:    * Kathryne Hitch, MD - Primary  PHYSICIAN ASSISTANT:   ASSISTANTS: none   ANESTHESIA:   none  EBL:  Total I/O In: 1750 [I.V.:1750] Out: 695 [Urine:175; Blood:520]  BLOOD ADMINISTERED:none  DRAINS: none   LOCAL MEDICATIONS USED:  NONE  SPECIMEN:  No Specimen  DISPOSITION OF SPECIMEN:  PATHOLOGY  COUNTS:  YES  TOURNIQUET:  * No tourniquets in log *  DICTATION: .Other Dictation: Dictation Number [id number]  PLAN OF CARE: Admit to inpatient   PATIENT DISPOSITION:  PACU - hemodynamically stable.   Delay start of Pharmacological VTE agent (>24hrs) due to surgical blood loss or risk of bleeding: no

## 2012-06-15 LAB — BASIC METABOLIC PANEL
BUN: 7 mg/dL (ref 6–23)
Calcium: 8.9 mg/dL (ref 8.4–10.5)
GFR calc non Af Amer: >90 mL/min (ref >90)
Glucose, Bld: 145 mg/dL — ABNORMAL HIGH (ref 70–99)
Sodium: 137 mEq/L (ref 135–145)

## 2012-06-15 LAB — CBC
Hemoglobin: 9.1 g/dL — ABNORMAL LOW (ref 12.0–15.0)
MCH: 26.8 pg (ref 26.0–34.0)
MCHC: 33.3 g/dL (ref 30.0–36.0)

## 2012-06-15 NOTE — Op Note (Signed)
NAMEDarrick Kennedy Carmen               ACCOUNT NO.:  [id number]  MEDICAL RECORD NO.:  [id number]  LOCATION:  1604                         FACILITY:  [facility]  PHYSICIAN:  Vanita Panda. Magnus Ivan, M.D.DATE OF BIRTH:  1966-08-21  DATE OF PROCEDURE:  06/14/2012 DATE OF DISCHARGE:                              OPERATIVE REPORT   PREOPERATIVE DIAGNOSIS:  Avascular necrosis, left hip.  POSTOPERATIVE DIAGNOSIS:  Avascular necrosis, left hip.  PROCEDURE:  Left total hip arthroplasty, direct anterior approach.  IMPLANTS:  DePuy Sector Gription acetabular component size 48, size 32+ 4 neutral polyethylene liner, size 8 Corail femoral component with standard offset, size 32+ 1 ceramic hip ball.  SURGEON:  Vanita Panda. Magnus Ivan, M.D.  ANESTHESIA:  General.  ANTIBIOTICS:  2 g of IV Ancef.  BLOOD LOSS:  550 mL.  COMPLICATIONS:  None.  INDICATIONS:  Ms. Dana Kennedy is only 46 year old female who has developed avascular necrosis of her left hip.  This was confirmed on MRI and plain films, and she has got her hip hurts daily.  She has failed conservative treatment, and at this point wishes to proceed with a total hip arthroplasty due to her decreased mobility, daily pain impact on her activities of daily living.  The risks and benefits of surgery were explained to her in detail.  She does wish to proceed.  PROCEDURE DESCRIPTION:  After informed consent was obtained and appropriate left hip was marked, she was brought to the operating room, general anesthesia was obtained while she was on her stretcher.  A Foley catheter was placed and then traction boots were placed on both of her feet so that she could be placed on in-line skeletal traction on the Hana fracture table with perineal post in place.  No traction was applied to her legs.  Her left hip was then prepped and draped with DuraPrep and sterile drapes.  Time-out was called.  She was identified as correct patient and correct left hip.  I then  made an incision just distal and posterior to the anterior superior iliac spine and carried this obliquely down the leg.  I dissected down to the tensor fascia lata and the tensor fascia was then divided obliquely, so I could then proceed with a direct anterior approach to the hip.  I placed a Cobra retractor around the lateral neck.  I cauterized the lateral and femoral circumflex vessels and then up underneath the rectus femoris, placed a Cobra retractor medially.  I then dissected through the joint capsule and put the Cobra retractors within the joint capsule.  I made my femoral neck cut just proximal to the lesser trochanter with an oscillating saw and finished this with an osteotome.  I used a corkscrew guide to remove the femoral head.  I then passed the femoral head off the table to pathology, also dislocation of the avascular necrosis.  I did find significant soft cartilage on the head.  Next, a Bent Hohmann was placed around the medial aspect of the acetabulum and a Cobra retractor laterally.  I cleaned the acetabular debris and then began reaming from a size 42 reamer and 2-mm increments up to the size 48. All reamers were placed under direct  visualization with the last reamer placed also under direct fluoroscopy, so I could assure my depth of reaming as well as my inclination and version.  I then chose a 48 Sector Gription acetabular component from DePuy and knocked this into the acetabulum into the cup and it was stable.  I still placed two screws. I then placed the real polyethylene liner, which was a 32+ 4 neutral. Attention was then turned to the femur.  With all traction off the leg, verified, the leg was externally rotated to 90 degrees, extended and adducted.  I placed a Mueller retractor medially and a Hohmann retractor off the greater trochanter.  I released the lateral capsule, the piriformis and then I used a box cutting guide to open up the femoral canal.  I then  trialed only with a size 8 trial due to her thick bone and thin femoral canal.  The size 8 was very stable, so I trialed a standard femoral neck and a 32+ 1 trial hip ball.  We brought the leg back up and over and then with the traction and internal rotation, we reduced the hip, it was stable with internal and external rotation with minimal shuck.  We brought the fluoroscopic unit in and under C-arm, measured her leg lengths to be equal.  I was pleased with the position of the implant.  I then re-dislocated the hip and removed all trial components.  I placed the real femoral component from DePuy, which was a Corail size 8 followed by the real 32+ 1 ceramic hip ball.  We reduced this and back into the acetabulum and again, it was stable.  We copiously irrigated the soft tissue with normal saline solution.  I closed the joint capsule with interrupted #1 Ethibond suture.  I closed the tensor fascia lata with a running 0 V-Loc suture followed by 2-0 Vicryl in the deep and subcutaneous tissue, and staples on the skin. Xeroform followed by a well-padded sterile dressing was applied.  She was taken off the Hana table, awakened, extubated and taken to the recovery room in stable condition.  All final counts were correct. There were no complications noted.     Vanita Panda. Magnus Ivan, M.D.     CYB/MEDQ  D:  06/14/2012  T:  06/15/2012  Job:  [id number]

## 2012-06-15 NOTE — Progress Notes (Signed)
Subjective: 1 Day Post-Op Procedure(s) (LRB): TOTAL HIP ARTHROPLASTY ANTERIOR APPROACH (Left) Patient reports pain as moderate.  Has been up with therapy.  Asymptomatic acute blood loss anemia.  Objective: Vital signs in last 24 hours: Temp:  [97.2 F (36.2 C)-99.2 F (37.3 C)] 97.9 F (36.6 C) (09/28 0449) Pulse Rate:  [87-93] 90  (09/28 0449) Resp:  [8-21] 18  (09/28 0743) BP: (102-131)/(68-81) 131/75 mmHg (09/28 0449) SpO2:  [96 %-100 %] 100 % (09/28 0743) FiO2 (%):  [99 %] 99 % (09/27 1515) Weight:  [78.019 kg (172 lb)] 78.019 kg (172 lb) (09/27 1317)  Intake/Output from previous day: 09/27 0701 - 09/28 0700 In: 4108.8 [P.O.:1080; I.V.:2928.8; IV Piggyback:100] Out: 1945 [Urine:1425; Blood:520] Intake/Output this shift: Total I/O In: 365 [P.O.:240; Other:125] Out: -    Basename 06/15/12 0508  HGB 9.1*    Basename 06/15/12 0508  WBC 9.4  RBC 3.39*  HCT 27.3*  PLT 108*    Basename 06/15/12 0508  NA 137  K 4.1  CL 102  CO2 25  BUN 7  CREATININE 0.66  GLUCOSE 145*  CALCIUM 8.9   No results found for this basename: LABPT:2,INR:2 in the last 72 hours  Sensation intact distally Intact pulses distally Dorsiflexion/Plantar flexion intact Incision: dressing C/D/I  Assessment/Plan: 1 Day Post-Op Procedure(s) (LRB): TOTAL HIP ARTHROPLASTY ANTERIOR APPROACH (Left) Up with therapy  Rosa Wyly Y 06/15/2012, 9:18 AM

## 2012-06-15 NOTE — Progress Notes (Signed)
Clinical Social Work Department BRIEF PSYCHOSOCIAL ASSESSMENT 06/15/2012  Patient:  Dana Kennedy, Dana Kennedy     Account Number:  [id number]     Admit date:  06/14/2012  Clinical Social Worker:  Leron Croak, CLINICAL SOCIAL WORKER  Date/Time:  06/15/2012 05:55 PM  Referred by:  Physician  Date Referred:  06/15/2012 Referred for  SNF Placement   Other Referral:   Interview type:  Patient Other interview type:   Husband had recently left for work.    PSYCHOSOCIAL DATA Living Status:  HUSBAND Admitted from facility:   Level of care:   Primary support name:  Dana Kennedy Primary support relationship to patient:  SPOUSE Degree of support available:   Pt has good support from her husband and community    CURRENT CONCERNS Current Concerns  Post-Acute Placement   Other Concerns:    SOCIAL WORK ASSESSMENT / PLAN CSW met with the Pt at the bedside. Pt was aware that she will need PT at a rehab facility. Pt has medicaid and was concerned about the payment for rehab. CSW informed Pt that we will be sending information to get approval for rehabilitation. Pt gave CSW permission to search in the [facility] area in preparation for discharge.   Assessment/plan status:  Information/Referral to [facility] Other assessment/ plan:   Information/referral to community resources:   CSW provided Pt with a SNF listing for the [facility] area and explained the process of search.    PATIENT'S/FAMILY'S RESPONSE TO PLAN OF CARE: Pt was appreciative for information and assistance with d/c. Pt will discuss facilities over with her husband.       Leron Croak, LCSWA [employer] [phone]

## 2012-06-15 NOTE — Evaluation (Signed)
Physical Therapy Evaluation Patient Details Name: Dana Kennedy MRN: [id number] DOB: 1966/05/28 Today's Date: 06/15/2012 Time: 1478-2956 PT Time Calculation (min): 45 min  PT Assessment / Plan / Recommendation Clinical Impression  Pt presents s/p L THA (direct ant) with decreased strength, ROM and mobility.  Tolerated OOB and ambulation in hallway, however required increased time for all tasks due to pain and increased anxiety with movement.  Pt will benefit from skilled PT in acute venue to address deficits.  PT recommends ST SNF due to pts husband not able to be with her at home 24/7 due to work schedule.     PT Assessment  Patient needs continued PT services    Follow Up Recommendations  Skilled nursing facility    Barriers to Discharge Decreased caregiver support      Equipment Recommendations  Rolling walker with 5" wheels;3 in 1 bedside comode    Recommendations for Other Services OT consult   Frequency 7X/week    Precautions / Restrictions Precautions Precautions: None Restrictions Weight Bearing Restrictions: No Other Position/Activity Restrictions: WBAT   Pertinent Vitals/Pain 7/10      Mobility  Bed Mobility Bed Mobility: Supine to Sit;Sitting - Scoot to Edge of Bed Supine to Sit: 4: Min assist Sitting - Scoot to [place] of Bed: 4: Min guard Details for Bed Mobility Assistance: Assist for LLE out of bed with cues for hand placement on bed to self assist trunk.  Pt very hesitant and anxious about any movement.  Transfers Transfers: Sit to Stand;Stand to Sit Sit to Stand: 4: Min assist;From elevated surface;With upper extremity assist;From bed Stand to Sit: 4: Min assist;With upper extremity assist;With armrests;To chair/3-in-1 Details for Transfer Assistance: Assist to rise and steady with cues for hand placement and LE management when sitting/standing.  Ambulation/Gait Ambulation/Gait Assistance: 4: Min assist Ambulation Distance (Feet): 50 Feet Assistive  device: Rolling walker Ambulation/Gait Assistance Details: Cues for sequencing/technique with RW, safety and upright posture.  Noted pt to have somewhat circumducted gait on L side initially, however able to correct with cuing.  Gait Pattern: Left circumduction;Decreased stance time - left;Decreased step length - right Gait velocity: decreased Stairs: No Wheelchair Mobility Wheelchair Mobility: No    Shoulder Instructions     Exercises     PT Diagnosis: Difficulty walking;Generalized weakness;Acute pain  PT Problem List: Decreased strength;Decreased range of motion;Decreased activity tolerance;Decreased balance;Decreased mobility;Decreased knowledge of use of DME;Pain PT Treatment Interventions: DME instruction;Gait training;Functional mobility training;Therapeutic activities;Therapeutic exercise;Balance training;Patient/family education   PT Goals Acute Rehab PT Goals PT Goal Formulation: With patient Time For Goal Achievement: 06/20/12 Potential to Achieve Goals: Good Pt will go Supine/Side to Sit: with supervision PT Goal: Supine/Side to Sit - Progress: Goal set today Pt will go Sit to Supine/Side: with supervision PT Goal: Sit to Supine/Side - Progress: Goal set today Pt will go Sit to Stand: with supervision PT Goal: Sit to Stand - Progress: Goal set today Pt will go Stand to Sit: with supervision PT Goal: Stand to Sit - Progress: Goal set today Pt will Ambulate: 51 - 150 feet;with supervision;with least restrictive assistive device PT Goal: Ambulate - Progress: Goal set today Pt will Perform Home Exercise Program: with supervision, verbal cues required/provided PT Goal: Perform Home Exercise Program - Progress: Goal set today  Visit Information  Last PT Received On: 06/15/12 Assistance Needed: +1    Subjective Data  Subjective: Are we getting up already Patient Stated Goal: to get some rehab before going home.    Prior  Functioning  Home Living Lives With:  Spouse Available Help at Discharge: Family Type of Home: House Home Access: Level entry Home Layout: One level Bathroom Shower/Tub: Network engineer: None Prior Function Level of Independence: Needs assistance Needs Assistance: Dressing Dressing: Minimal Able to Take Stairs?: Yes Driving: Yes Communication Communication: No difficulties    Cognition  Overall Cognitive Status: Appears within functional limits for tasks assessed/performed Arousal/Alertness: Awake/alert Orientation Level: Appears intact for tasks assessed Behavior During Session: [facility] for tasks performed    Extremity/Trunk Assessment Right Lower Extremity Assessment RLE ROM/Strength/Tone: WFL for tasks assessed RLE Sensation: WFL - Light Touch RLE Coordination: WFL - gross/fine motor Left Lower Extremity Assessment LLE ROM/Strength/Tone: Deficits;Unable to fully assess;Due to pain LLE ROM/Strength/Tone Deficits: ankle motions WFL, unable to fully test knee flex or any hip motions due to pain and increased anxiety with movement. LLE Sensation: WFL - Light Touch Trunk Assessment Trunk Assessment: Normal   Balance    End of Session PT - End of Session Equipment Utilized During Treatment: Gait belt Activity Tolerance: Patient limited by pain Patient left: in chair;with call bell/phone within reach Nurse Communication: Mobility status  GP     Page, Meribeth Mattes 06/15/2012, 10:01 AM

## 2012-06-15 NOTE — Progress Notes (Signed)
Physical Therapy Treatment Patient Details Name: Dana Kennedy MRN: [id number] DOB: 1966/09/01 Today's Date: 06/15/2012 Time: 4098-1191 PT Time Calculation (min): 35 min  PT Assessment / Plan / Recommendation Comments on Treatment Session  Pt with slow gait speed, however increased ambulation distance this pm.  Also requires some assist with exercises due to increased pain and weakness.  Continue to recommend ST SNF for pt following acute stay.     Follow Up Recommendations  Skilled nursing facility    Barriers to Discharge        Equipment Recommendations  Rolling walker with 5" wheels;3 in 1 bedside comode    Recommendations for Other Services OT consult  Frequency 7X/week   Plan Discharge plan remains appropriate    Precautions / Restrictions Precautions Precautions: None Restrictions Weight Bearing Restrictions: No Other Position/Activity Restrictions: WBAT   Pertinent Vitals/Pain 9/10 following amb, pt premedicated, ice pack applied    Mobility  Bed Mobility Bed Mobility: Supine to Sit;Sit to Supine Supine to Sit: 4: Min assist;HOB elevated;With rails Sit to Supine: 4: Min assist;HOB flat Details for Bed Mobility Assistance: Assist for LLE out of and into bed with mod cues for hand placement on bed instead of rails.  Pt continues to be somewhat anxious about movement and has increased pain with all movement.  Transfers Transfers: Sit to Stand;Stand to Sit Sit to Stand: 4: Min assist;From elevated surface;With upper extremity assist;From bed Stand to Sit: 4: Min assist;Without upper extremity assist;To bed Details for Transfer Assistance: some assist to rise with cues for hand placement and LE management.   Ambulation/Gait Ambulation/Gait Assistance: 4: Min guard Ambulation Distance (Feet): 80 Feet Assistive device: Rolling walker Ambulation/Gait Assistance Details: Mod cues for sequencing/technique with RW, upiright and relaxed posture and increasing UE WB in orde  to decrease pain and provide smoother gait pattern.  Gait Pattern: Decreased stance time - left;Decreased step length - right Gait velocity: decreased    Exercises Total Joint Exercises Ankle Circles/Pumps: AROM;Both;20 reps Quad Sets: AROM;Strengthening;Left;10 reps Short Arc Quad: AROM;Left;10 reps;Strengthening Heel Slides: AAROM;Left;10 reps Hip ABduction/ADduction: AAROM;Left;10 reps   PT Diagnosis:    PT Problem List:   PT Treatment Interventions:     PT Goals Acute Rehab PT Goals PT Goal Formulation: With patient Time For Goal Achievement: 06/20/12 Potential to Achieve Goals: Good Pt will go Supine/Side to Sit: with supervision PT Goal: Supine/Side to Sit - Progress: Progressing toward goal Pt will go Sit to Supine/Side: with supervision PT Goal: Sit to Supine/Side - Progress: Progressing toward goal Pt will go Sit to Stand: with supervision PT Goal: Sit to Stand - Progress: Progressing toward goal Pt will go Stand to Sit: with supervision PT Goal: Stand to Sit - Progress: Progressing toward goal Pt will Ambulate: 51 - 150 feet;with supervision;with least restrictive assistive device PT Goal: Ambulate - Progress: Progressing toward goal Pt will Perform Home Exercise Program: with supervision, verbal cues required/provided PT Goal: Perform Home Exercise Program - Progress: Progressing toward goal  Visit Information  Last PT Received On: 06/15/12 Assistance Needed: +1    Subjective Data  Subjective: Are we going to do that exercise? (knee flex) Patient Stated Goal: to get some rehab before going home.    Cognition  Overall Cognitive Status: Appears within functional limits for tasks assessed/performed Arousal/Alertness: Awake/alert Orientation Level: Appears intact for tasks assessed Behavior During Session: [facility] for tasks performed    Balance     End of Session PT - End of Session Activity Tolerance:  Patient limited by pain Patient left: in bed;with call  bell/phone within reach;with family/visitor present Nurse Communication: Mobility status   GP     Page, Meribeth Mattes 06/15/2012, 2:10 PM

## 2012-06-16 LAB — CBC
MCH: 26.6 pg (ref 26.0–34.0)
MCHC: 32.9 g/dL (ref 30.0–36.0)
Platelets: 123 10*3/uL — ABNORMAL LOW (ref 150–400)
RBC: 3.2 MIL/uL — ABNORMAL LOW (ref 3.87–5.11)

## 2012-06-16 MED ORDER — BLISTEX EX OINT
TOPICAL_OINTMENT | CUTANEOUS | Status: AC
  Filled 2012-06-16: qty 10

## 2012-06-16 NOTE — Evaluation (Signed)
Occupational Therapy Evaluation Patient Details Name: Dana Kennedy MRN: [id number] DOB: 1966-06-28 Today's Date: 06/16/2012 Time: 4098-1191 OT Time Calculation (min): 28 min  OT Assessment / Plan / Recommendation Clinical Impression  Pt is s/p L direct anterior hip replacement and displays increased pain, decreased independence with ADL and functional mobility and would benefit from skilled OTservices to improve ADL independence for next venue of care.     OT Assessment  Patient needs continued OT Services    Follow Up Recommendations  Skilled nursing facility    Barriers to Discharge      Equipment Recommendations  Rolling walker with 5" wheels;3 in 1 bedside comode    Recommendations for Other Services    Frequency  Min 2X/week    Precautions / Restrictions Precautions Precautions: None Restrictions Weight Bearing Restrictions: No Other Position/Activity Restrictions: WBAT        ADL  Eating/Feeding: Simulated;Independent Where Assessed - Eating/Feeding: Chair Grooming: Performed;Wash/dry hands;Min guard Where Assessed - Grooming: Unsupported standing Upper Body Bathing: Simulated;Right arm;Chest;Left arm;Abdomen;Set up Where Assessed - Upper Body Bathing: Unsupported sitting Lower Body Bathing: Simulated;Minimal assistance;Other (comment) (for L LE below knee to foot) Where Assessed - Lower Body Bathing: Supported sit to stand Upper Body Dressing: Simulated;Set up Where Assessed - Upper Body Dressing: Unsupported sitting Lower Body Dressing: Simulated;Minimal assistance Where Assessed - Lower Body Dressing: Supported sit to stand Toilet Transfer: Performed;Min guard;Other (comment) (min verbal cues for safety with hand placement) Toilet Transfer Equipment: Raised toilet seat with arms (or 3-in-1 over toilet) Toileting - Clothing Manipulation and Hygiene: Performed;Min guard Where Assessed - Toileting Clothing Manipulation and Hygiene: Sit to stand from 3-in-1 or  toilet Equipment Used: Rolling walker ADL Comments: Educated verbally on AE available for LB ADL and briefly demonstrated. Pt would benefit from practice however.     OT Diagnosis:    OT Problem List: Decreased strength;Decreased range of motion;Decreased knowledge of use of DME or AE;Pain OT Treatment Interventions: Self-care/ADL training;Therapeutic activities;DME and/or AE instruction;Patient/family education   OT Goals Acute Rehab OT Goals OT Goal Formulation: With patient/family Time For Goal Achievement: 06/23/12 Potential to Achieve Goals: Good ADL Goals Pt Will Perform Grooming: with supervision;Standing at sink ADL Goal: Grooming - Progress: Goal set today Pt Will Perform Lower Body Bathing: with supervision;with adaptive equipment;Sit to stand from chair;Sit to stand from bed ADL Goal: Lower Body Bathing - Progress: Goal set today Pt Will Perform Lower Body Dressing: with supervision;with adaptive equipment;Sit to stand from bed;Sit to stand from chair ADL Goal: Lower Body Dressing - Progress: Goal set today Pt Will Transfer to Toilet: with supervision;Ambulation;with DME;3-in-1 ADL Goal: Toilet Transfer - Progress: Goal set today Pt Will Perform Toileting - Clothing Manipulation: with supervision;Standing ADL Goal: Toileting - Clothing Manipulation - Progress: Goal set today  Visit Information  Last OT Received On: 06/16/12 Assistance Needed: +1    Subjective Data  Subjective: I do need to go to the bathroom Patient Stated Goal: agreeable to OT; none stated   Prior Functioning     Home Living Lives With: Spouse Available Help at Discharge: Family Type of Home: House Home Access: Level entry Home Layout: One level Bathroom Shower/Tub: Engineer, manufacturing systems: Standard Home Adaptive Equipment: None Prior Function Level of Independence: Needs assistance Needs Assistance: Dressing Dressing: Minimal Able to Take Stairs?: Yes Driving:  Yes Communication Communication: No difficulties         Vision/Perception     Cognition  Overall Cognitive Status: Appears within functional limits for  tasks assessed/performed Arousal/Alertness: Awake/alert Orientation Level: Appears intact for tasks assessed Behavior During Session: [facility] for tasks performed    Extremity/Trunk Assessment Right Upper Extremity Assessment RUE ROM/Strength/Tone: [facility] for tasks assessed Left Upper Extremity Assessment LUE ROM/Strength/Tone: WFL for tasks assessed     Mobility Bed Mobility Bed Mobility: Supine to Sit Supine to Sit: 4: Min assist;HOB elevated;With rails Details for Bed Mobility Assistance: Assist for LLE out of bed with min cues for hand placement on bed to self assist trunk.  Transfers Transfers: Sit to Stand;Stand to Sit Sit to Stand: 4: Min guard;With upper extremity assist;From chair/3-in-1 Stand to Sit: 4: Min guard;With upper extremity assist;To chair/3-in-1 Details for Transfer Assistance: min verbal cues for safety for hand placement and LE management     Shoulder Instructions     Exercise     Balance Balance Balance Assessed: Yes Dynamic Standing Balance Dynamic Standing - Level of Assistance: 5: Stand by assistance   End of Session OT - End of Session Activity Tolerance: Patient tolerated treatment well Patient left: in chair;with call bell/phone within reach;with family/visitor present  GO     Lennox Laity [phone] 06/16/2012, 11:07 AM

## 2012-06-16 NOTE — Progress Notes (Signed)
Physical Therapy Treatment Patient Details Name: Dana Kennedy MRN: [id number] DOB: 06/20/1966 Today's Date: 06/16/2012 Time: 4098-1191 PT Time Calculation (min): 22 min  PT Assessment / Plan / Recommendation Comments on Treatment Session  Pt making slow progress with somewhat improved gait speed.  She states that she had a rough night with increased pain.     Follow Up Recommendations  Skilled nursing facility    Barriers to Discharge        Equipment Recommendations  Rolling walker with 5" wheels;3 in 1 bedside comode    Recommendations for Other Services    Frequency 7X/week   Plan Discharge plan remains appropriate    Precautions / Restrictions Precautions Precautions: None Restrictions Weight Bearing Restrictions: No Other Position/Activity Restrictions: WBAT   Pertinent Vitals/Pain 6/10    Mobility  Bed Mobility Bed Mobility: Supine to Sit Supine to Sit: 4: Min assist;HOB elevated;With rails Details for Bed Mobility Assistance: Assist for LLE out of bed with min cues for hand placement on bed to self assist trunk.  Transfers Transfers: Sit to Stand;Stand to Sit Sit to Stand: 4: Min guard;From elevated surface;With upper extremity assist;From bed Stand to Sit: 4: Min guard;With upper extremity assist;With armrests;To chair/3-in-1 Details for Transfer Assistance: Guarding assist for safety with cues for hand placement and LE management when sitting/standing.  Ambulation/Gait Ambulation/Gait Assistance: 4: Min guard Ambulation Distance (Feet): 100 Feet Assistive device: Rolling walker Ambulation/Gait Assistance Details: Cues for upright and relaxed posture, sequencing/technique and not stepping too far inside of RW.  Gait Pattern: Decreased stance time - left;Decreased step length - right Gait velocity: decreased    Exercises     PT Diagnosis:    PT Problem List:   PT Treatment Interventions:     PT Goals Acute Rehab PT Goals PT Goal Formulation: With  patient Time For Goal Achievement: 06/20/12 Potential to Achieve Goals: Good Pt will go Supine/Side to Sit: with supervision PT Goal: Supine/Side to Sit - Progress: Progressing toward goal Pt will go Sit to Stand: with supervision PT Goal: Sit to Stand - Progress: Progressing toward goal Pt will go Stand to Sit: with supervision PT Goal: Stand to Sit - Progress: Progressing toward goal Pt will Ambulate: 51 - 150 feet;with supervision;with least restrictive assistive device PT Goal: Ambulate - Progress: Progressing toward goal  Visit Information  Last PT Received On: 06/16/12 Assistance Needed: +1    Subjective Data  Subjective: I'll wait and do the exercises this afternoon.  Patient Stated Goal: to get some rehab before going home.    Cognition  Overall Cognitive Status: Appears within functional limits for tasks assessed/performed Arousal/Alertness: Awake/alert Orientation Level: Appears intact for tasks assessed Behavior During Session: [facility] for tasks performed    Balance     End of Session PT - End of Session Activity Tolerance: Patient tolerated treatment well;Patient limited by fatigue Patient left: in chair;with call bell/phone within reach;with family/visitor present Nurse Communication: Mobility status   GP     Page, Meribeth Mattes 06/16/2012, 9:54 AM

## 2012-06-16 NOTE — Progress Notes (Signed)
Physical Therapy Treatment Patient Details Name: Dana Kennedy MRN: [id number] DOB: September 02, 1966 Today's Date: 06/16/2012 Time: 2130-8657 PT Time Calculation (min): 45 min  PT Assessment / Plan / Recommendation Comments on Treatment Session  Pt continues making slow progress.  She did increase amb distance in pm, however is progressing slower with exercises due to increased pain.     Follow Up Recommendations  Skilled nursing facility    Barriers to Discharge        Equipment Recommendations  Rolling walker with 5" wheels;3 in 1 bedside comode    Recommendations for Other Services OT consult  Frequency 7X/week   Plan Discharge plan remains appropriate    Precautions / Restrictions Precautions Precautions: None Restrictions Weight Bearing Restrictions: No Other Position/Activity Restrictions: WBAT   Pertinent Vitals/Pain 6/10, RN aware    Mobility  Bed Mobility Bed Mobility: Supine to Sit;Sit to Supine Supine to Sit: 4: Min assist;HOB elevated;With rails Sit to Supine: 4: Min assist;HOB flat Details for Bed Mobility Assistance: Assist for LLE into and out of bed.  Pt with somewhat improved mobility.  Transfers Transfers: Sit to Stand;Stand to Sit Sit to Stand: 4: Min guard;With upper extremity assist;From chair/3-in-1 Stand to Sit: 4: Min guard;With upper extremity assist;To bed;To elevated surface Details for Transfer Assistance: min verbal cues for safety for hand placement and LE management Ambulation/Gait Ambulation/Gait Assistance: 4: Min guard Ambulation Distance (Feet): 120 Feet Assistive device: Rolling walker Ambulation/Gait Assistance Details: Min cues for not stepping too far inside of RW and to correct some abd in LLE when walking.  Gait Pattern: Decreased stance time - left;Decreased step length - right Gait velocity: decreased General Gait Details: Continues to have decreased gait speed.     Exercises Total Joint Exercises Ankle Circles/Pumps:  AROM;Both;20 reps Quad Sets: AROM;Strengthening;Left;10 reps Short Arc Quad: AROM;Left;10 reps;Strengthening Heel Slides: AAROM;Left;10 reps Hip ABduction/ADduction: AAROM;Left;10 reps   PT Diagnosis:    PT Problem List:   PT Treatment Interventions:     PT Goals Acute Rehab PT Goals PT Goal Formulation: With patient Time For Goal Achievement: 06/20/12 Potential to Achieve Goals: Good Pt will go Supine/Side to Sit: with supervision PT Goal: Supine/Side to Sit - Progress: Progressing toward goal Pt will go Sit to Supine/Side: with supervision PT Goal: Sit to Supine/Side - Progress: Progressing toward goal Pt will go Sit to Stand: with supervision PT Goal: Sit to Stand - Progress: Progressing toward goal Pt will go Stand to Sit: with supervision PT Goal: Stand to Sit - Progress: Progressing toward goal Pt will Ambulate: 51 - 150 feet;with supervision;with least restrictive assistive device PT Goal: Ambulate - Progress: Progressing toward goal Pt will Perform Home Exercise Program: with supervision, verbal cues required/provided PT Goal: Perform Home Exercise Program - Progress: Progressing toward goal  Visit Information  Last PT Received On: 06/16/12 Assistance Needed: +1    Subjective Data  Subjective: I'm ready Patient Stated Goal: to get some rehab before going home.    Cognition  Overall Cognitive Status: Appears within functional limits for tasks assessed/performed Arousal/Alertness: Awake/alert Orientation Level: Appears intact for tasks assessed Behavior During Session: [facility] for tasks performed    Balance     End of Session PT - End of Session Activity Tolerance: Patient tolerated treatment well;Patient limited by fatigue Patient left: in bed;with call bell/phone within reach Nurse Communication: Mobility status   GP     Page, Meribeth Mattes 06/16/2012, 4:57 PM

## 2012-06-16 NOTE — Progress Notes (Signed)
Subjective: 2 Days Post-Op Procedure(s) (LRB): TOTAL HIP ARTHROPLASTY ANTERIOR APPROACH (Left) Patient reports pain as moderate.  Asymptomatic acute blood loss anemia.  Therapy is recommending ST-SNF. Objective: Vital signs in last 24 hours: Temp:  [98.4 F (36.9 C)-99.6 F (37.6 C)] 99.3 F (37.4 C) (09/29 0435) Pulse Rate:  [93-102] 100  (09/29 0435) Resp:  [16-20] 16  (09/29 0435) BP: (131-138)/(79-87) 133/86 mmHg (09/29 0435) SpO2:  [96 %-98 %] 97 % (09/29 0435)  Intake/Output from previous day: 09/28 0701 - 09/29 0700 In: 847.5 [P.O.:240; I.V.:482.5] Out: 1000 [Urine:1000] Intake/Output this shift:     Basename 06/16/12 0411 06/15/12 0508  HGB 8.5* 9.1*    Basename 06/16/12 0411 06/15/12 0508  WBC 11.4* 9.4  RBC 3.20* 3.39*  HCT 25.8* 27.3*  PLT 123* 108*    Basename 06/15/12 0508  NA 137  K 4.1  CL 102  CO2 25  BUN 7  CREATININE 0.66  GLUCOSE 145*  CALCIUM 8.9   No results found for this basename: LABPT:2,INR:2 in the last 72 hours  Sensation intact distally Intact pulses distally Dorsiflexion/Plantar flexion intact Incision: scant drainage  Assessment/Plan: 2 Days Post-Op Procedure(s) (LRB): TOTAL HIP ARTHROPLASTY ANTERIOR APPROACH (Left) Up with therapy Social Work consult; needs FL-2 on chart.  Kathryne Hitch 06/16/2012, 8:33 AM

## 2012-06-16 NOTE — Progress Notes (Signed)
Clinical Social Work Department CLINICAL SOCIAL WORK PLACEMENT NOTE 06/16/2012  Patient:  Dana Kennedy, Dana Kennedy  Account Number:  [id number] Admit date:  06/14/2012  Clinical Social Worker:  Leron Croak, CLINICAL SOCIAL WORKER  Date/time:  06/16/2012 03:03 PM  Clinical Social Work is seeking post-discharge placement for this patient at the following level of care:   SKILLED NURSING   (*CSW will update this form in Epic as items are completed)   06/15/2012  Patient/family provided with [facility] Department of Clinical Social Work's list of facilities offering this level of care within the geographic area requested by the patient (or if unable, by the patient's family).  06/15/2012  Patient/family informed of their freedom to choose among providers that offer the needed level of care, that participate in Medicare, Medicaid or managed care program needed by the patient, have an available bed and are willing to accept the patient.  06/15/2012  Patient/family informed of [facility]' ownership interest in [facility], as well as of the fact that they are under no obligation to receive care at this facility.  PASARR submitted to EDS on  PASARR number received from EDS on   FL2 transmitted to all facilities in geographic area requested by pt/family on  06/16/2012 FL2 transmitted to all facilities within larger geographic area on 06/16/2012  Patient informed that his/her managed care company has contracts with or will negotiate with  certain facilities, including the following:     Patient/family informed of bed offers received:   Patient chooses bed at  Physician recommends and patient chooses bed at    Patient to be transferred to  on   Patient to be transferred to facility by   The following physician request were entered in Epic:   Additional Comments:  Leron Croak, Leeroy Bock Long Weekend Coverage [phone]

## 2012-06-17 ENCOUNTER — Encounter (HOSPITAL_COMMUNITY): Payer: Self-pay | Admitting: Orthopaedic Surgery

## 2012-06-17 LAB — CBC
Platelets: 149 10*3/uL — ABNORMAL LOW (ref 150–400)
RDW: 14.2 % (ref 11.5–15.5)
WBC: 10.5 10*3/uL (ref 4.0–10.5)

## 2012-06-17 MED ORDER — ASPIRIN 325 MG PO TBEC: 325.0000 mg | DELAYED_RELEASE_TABLET | Freq: Two times a day (BID) | ORAL | Status: DC

## 2012-06-17 MED ORDER — METHOCARBAMOL 500 MG PO TABS: 500.0000 mg | ORAL_TABLET | Freq: Four times a day (QID) | ORAL | Status: DC | PRN

## 2012-06-17 MED ORDER — OXYCODONE-ACETAMINOPHEN 5-325 MG PO TABS: 1.0000 | ORAL_TABLET | ORAL | Status: AC | PRN

## 2012-06-17 NOTE — Progress Notes (Signed)
Occupational Therapy Treatment Patient Details Name: Dana Kennedy MRN: [id number] DOB: 14-Mar-1966 Today's Date: 06/17/2012 Time: 4098-1191 OT Time Calculation (min): 23 min  OT Assessment / Plan / Recommendation Comments on Treatment Session Pt making good progress. Educated further on AE today.     Follow Up Recommendations  Skilled nursing facility    Barriers to Discharge       Equipment Recommendations  Rolling walker with 5" wheels;3 in 1 bedside comode    Recommendations for Other Services    Frequency Min 2X/week   Plan Discharge plan remains appropriate    Precautions / Restrictions Precautions Precautions: None Restrictions Weight Bearing Restrictions: No Other Position/Activity Restrictions: WBAT        ADL  Grooming: Performed;Wash/dry hands;Supervision/safety Where Assessed - Grooming: Unsupported standing Lower Body Bathing: Performed;Supervision/safety;Other (comment) (wash periareas only) Where Assessed - Lower Body Bathing: Unsupported standing Toilet Transfer: Performed;Supervision/safety Toilet Transfer Method: Sit to Barista: Raised toilet seat with arms (or 3-in-1 over toilet) Equipment Used: Rolling walker;Long-handled shoe horn;Long-handled sponge;Reacher;Sock aid ADL Comments: Pt practiced with AE at EOB. Pt not able to reach L foot yet so discussed AE further and practiced. Pt doffed sock with reacher with increased time with supervision. Donned X2 with sock aid with supervision and min cues. Pt simulated use of LHS on LEs below knee and also with shoe horn but didnt actually don a shoe. Educated on coverage of AE.     OT Diagnosis:    OT Problem List:   OT Treatment Interventions:     OT Goals ADL Goals ADL Goal: Grooming - Progress: Met ADL Goal: Toilet Transfer - Progress: Progressing toward goals (only observed sit to stand from 3in1. pt already on 3in1 )  Visit Information  Last OT Received On:  06/17/12 Assistance Needed: +1    Subjective Data  Subjective: I need to wash up Patient Stated Goal: agreeable to OT; wanted to wash at sink some   Prior Functioning       Cognition  Overall Cognitive Status: Appears within functional limits for tasks assessed/performed Arousal/Alertness: Awake/alert Orientation Level: Appears intact for tasks assessed Behavior During Session: [facility] for tasks performed    Mobility  Shoulder Instructions Bed Mobility Bed Mobility: Sit to Supine Supine to Sit: 5: Supervision Sit to Supine: 4: Min assist;HOB elevated Details for Bed Mobility Assistance: min assist to help bring L LE back onto bed. Pt unable to complete this with attempting to use R LE to help L LE.  Transfers Transfers: Sit to Stand;Stand to Sit Sit to Stand: 5: Supervision;With upper extremity assist;From chair/3-in-1 Stand to Sit: 5: Supervision;With upper extremity assist;To bed Details for Transfer Assistance: verbal cues for safe technique          Balance Balance Balance Assessed: Yes Dynamic Standing Balance Dynamic Standing - Level of Assistance: 5: Stand by assistance   End of Session OT - End of Session Activity Tolerance: Patient tolerated treatment well Patient left: in bed;with call bell/phone within reach;with family/visitor present  GO     Lennox Laity [phone] 06/17/2012, 1:00 PM

## 2012-06-17 NOTE — Care Management Note (Signed)
    Page 1 of 2   06/17/2012     4:51:21 PM   CARE MANAGEMENT NOTE 06/17/2012  Patient:  Dana Kennedy, Dana Kennedy   Account Number:  [id number]  Date Initiated:  06/17/2012  Documentation initiated by:  Colleen Can  Subjective/Objective Assessment:   dx avascular necrosis/total hip replacemnt     Action/Plan:   SNF rehab   Anticipated DC Date:  06/17/2012   Anticipated DC Plan:  SKILLED NURSING FACILITY  In-house referral  Clinical Social Worker      DC Planning Services  NA      [facility] Choice  NA   Choice offered to / List presented to:  NA   DME arranged  NA      DME agency  NA     HH arranged  NA      HH agency  NA   Status of service:  Completed, signed off Medicare Important Message given?  NA - LOS <3 / Initial given by admissions (If response is "NO", the following Medicare IM given date fields will be blank) Date Medicare IM given:   Date Additional Medicare IM given:    Discharge Disposition:  SKILLED NURSING FACILITY  Per UR Regulation:    If discussed at Long Length of Stay Meetings, dates discussed:    Comments:

## 2012-06-17 NOTE — Progress Notes (Signed)
vss, afebrile.  Pt d/c to SNF.  No iv site present.  Pt ready for d/c, pt transported via EMS to SNF at this time.

## 2012-06-17 NOTE — Discharge Summary (Signed)
Patient ID: ASAL TEAS MRN: [id number] DOB/AGE: Dec 23, 1965 46 y.o.  Admit date: 06/14/2012 Discharge date: 06/17/2012  Admission Diagnoses:  Principal Problem:  *Avascular necrosis of hip   Discharge Diagnoses:  Same  Past Medical History  Diagnosis Date  . Lupus   . Asthma   . GERD (gastroesophageal reflux disease)   . Arthritis   . Anemia     Surgeries: Procedure(s): TOTAL HIP ARTHROPLASTY ANTERIOR APPROACH on 06/14/2012   Consultants:    Discharged Condition: Improved  Hospital Course: LURENA NAEVE is an 46 y.o. female who was admitted 06/14/2012 for operative treatment ofAvascular necrosis of hip. Patient has severe unremitting pain that affects sleep, daily activities, and work/hobbies. After pre-op clearance the patient was taken to the operating room on 06/14/2012 and underwent  Procedure(s): TOTAL HIP ARTHROPLASTY ANTERIOR APPROACH.    Patient was given perioperative antibiotics: Anti-infectives     Start     Dose/Rate Route Frequency Ordered Stop   06/14/12 1400   ceFAZolin (ANCEF) IVPB 1 g/50 mL premix        1 g 100 mL/hr over 30 Minutes Intravenous Every 6 hours 06/14/12 1330 06/14/12 2056   06/14/12 0700   ceFAZolin (ANCEF) IVPB 2 g/50 mL premix        2 g 100 mL/hr over 30 Minutes Intravenous 60 min pre-op 06/14/12 0700 06/14/12 1000           Patient was given sequential compression devices, early ambulation, and chemoprophylaxis to prevent DVT.  Patient benefited maximally from hospital stay and there were no complications.    Recent vital signs: Patient Vitals for the past 24 hrs:  BP Temp Temp src Pulse Resp SpO2  06/17/12 0440 135/91 mmHg 98.1 F (36.7 C) Oral 81  20  99 %  06/16/12 2100 137/88 mmHg 98.1 F (36.7 C) Oral 76  16  97 %  06/16/12 1736 139/90 mmHg 98.9 F (37.2 C) Oral 91  17  96 %  06/16/12 1557 - - - - 18  97 %  06/16/12 1155 - - - - 16  98 %  06/16/12 0800 - - - - 16  98 %     Recent laboratory studies:    Basename 06/17/12 0351 06/16/12 0411 06/15/12 0508  WBC 10.5 11.4* --  HGB 8.2* 8.5* --  HCT 24.7* 25.8* --  PLT 149* 123* --  NA -- -- 137  K -- -- 4.1  CL -- -- 102  CO2 -- -- 25  BUN -- -- 7  CREATININE -- -- 0.66  GLUCOSE -- -- 145*  INR -- -- --  CALCIUM -- -- 8.9     Discharge Medications:     Medication List     As of 06/17/2012  6:46 AM    TAKE these medications         albuterol 108 (90 BASE) MCG/ACT inhaler   Commonly known as: PROVENTIL HFA;VENTOLIN HFA   Inhale 2 puffs into the lungs every 6 (six) hours as needed. For shortness of breath      aspirin 325 MG EC tablet   Take 1 tablet (325 mg total) by mouth 2 (two) times daily.      bismuth subsalicylate 262 MG chewable tablet   Commonly known as: PEPTO BISMOL   Chew 524 mg by mouth as needed. For upset stomach      cetirizine 10 MG tablet   Commonly known as: ZYRTEC   Take 10 mg by mouth every evening.  ferrous sulfate 325 (65 FE) MG tablet   Take 325 mg by mouth daily with breakfast.      folic acid 1 MG tablet   Commonly known as: FOLVITE   Take 1 mg by mouth daily.      meloxicam 15 MG tablet   Commonly known as: MOBIC   Take 15 mg by mouth daily.      methocarbamol 500 MG tablet   Commonly known as: ROBAXIN   Take 500 mg by mouth 4 (four) times daily.      methocarbamol 500 MG tablet   Commonly known as: ROBAXIN   Take 1 tablet (500 mg total) by mouth every 6 (six) hours as needed.      oxyCODONE-acetaminophen 5-325 MG per tablet   Commonly known as: PERCOCET/ROXICET   Take 1-2 tablets by mouth every 4 (four) hours as needed for pain.      pantoprazole 40 MG tablet   Commonly known as: PROTONIX   Take 40 mg by mouth every morning.      predniSONE 20 MG tablet   Commonly known as: DELTASONE   Take 20 mg by mouth 2 (two) times daily.      zolpidem 5 MG tablet   Commonly known as: AMBIEN   Take 5 mg by mouth at bedtime as needed. For sleep        Diagnostic Studies: Dg  Chest 2 View  06/12/2012  *RADIOLOGY REPORT*  Clinical Data: Preop left total hip arthroplasty  CHEST - 2 VIEW  Comparison: 09/08/2010  Findings: The heart size and mediastinal contours are within normal limits.  Both lungs are clear.  The visualized skeletal structures are unremarkable.  IMPRESSION: Negative exam.   Original Report Authenticated By: Rosealee Albee, M.D.    Dg Hip Complete Left  06/14/2012  *RADIOLOGY REPORT*  Clinical Data: Left hip replacement  LEFT HIP - COMPLETE 2+ VIEW  Comparison: 05/10/2012  Findings: Left total hip arthroplasty is in place.  Anatomic alignment of the osseous and prosthetic structures.  No breakage or loosening of the hardware.  IMPRESSION: Left total hip arthroplasty anatomically aligned.   Original Report Authenticated By: Donavan Burnet, M.D.    Dg Pelvis Portable  06/14/2012  *RADIOLOGY REPORT*  Clinical Data: History of left hip surgery.  PORTABLE PELVIS  Comparison: 06/14/2012 study.  Findings: Left hip arthroplasty has been performed.  There is expected relationship between the femoral and acetabular hardware components.  No dislocation is evident.  Surgical skin staples are present.  There is some soft tissue edema and air at the operative site.  Pelvic phleboliths are present.  IMPRESSION: Post left hip arthroplasty procedure.  No dislocation or disruption of hardware is evident.   Original Report Authenticated By: Crawford Givens, M.D.    Dg Hip Portable 1 View Left  06/14/2012  *RADIOLOGY REPORT*  Clinical Data:  Postop, left hip surgery  PORTABLE LEFT HIP - 1 VIEW  Comparison: Intraoperative radiographs obtained the same day, prior MRI of the left hip 05/10/2012  Findings: A single cross-table lateral view of the hip again demonstrates interval surgical changes of total hip arthroplasty without evidence of immediate complication.  The femoral head component is located within the acetabular component.  Cutaneous skin staples and scattered subcutaneous  emphysema are noted.  IMPRESSION: Surgical changes of left hip total arthroplasty without evidence of complication as above.   Original Report Authenticated By: Alvino Blood C-arm 1-60 Min-no Report  06/14/2012  CLINICAL DATA:  left hip surgery   C-ARM 1-60 MINUTES  Fluoroscopy was utilized by the requesting physician.  No radiographic  interpretation.      Disposition: to skilled nursing facility      Discharge Orders    Future Appointments: Provider: Department: Dept Phone: Center:   07/09/2012 9:00 AM Dava Najjar Idelle Jo [facility] [phone] None   07/09/2012 9:30 AM Ottie Glazier Buzzy Han, MD [facility] [phone] None     Future Orders Please Complete By Expires   Diet - low sodium heart healthy      Call MD / Call 911      Comments:   If you experience chest pain or shortness of breath, CALL 911 and be transported to the hospital emergency room.  If you develope a fever above 101 F, pus (white drainage) or increased drainage or redness at the wound, or calf pain, call your surgeon's office.   Constipation Prevention      Comments:   Drink plenty of fluids.  Prune juice may be helpful.  You may use a stool softener, such as Colace (over the counter) 100 mg twice a day.  Use MiraLax (over the counter) for constipation as needed.   Increase activity slowly as tolerated      Discharge instructions      Comments:   Weight bearing as tolerated left hip; no hip precautions. Can get incision wet in the shower starting 06/19/12; dry dressing daily left hip incision   Discharge patient         Follow-up Information    Follow up with Kathryne Hitch, MD. In 2 weeks.   Contact information:   [facility] [address] [place] [address] [phone]           Signed: Kathryne Hitch 06/17/2012, 6:46 AM

## 2012-06-17 NOTE — Progress Notes (Signed)
Patient ID: Dana Kennedy, female   DOB: 1966/04/10, 46 y.o.   MRN: [id number] Slow progress with therapy.  ST-SNF placement recommended.  Can d/c to SNF today if bed available.

## 2012-06-17 NOTE — Progress Notes (Signed)
Utilization review completed.  

## 2012-06-17 NOTE — Progress Notes (Signed)
Physical Therapy Treatment Patient Details Name: Dana Kennedy MRN: [id number] DOB: 1966/06/29 Today's Date: 06/17/2012 Time: 6213-0865 PT Time Calculation (min): 28 min  PT Assessment / Plan / Recommendation Comments on Treatment Session  Pt pleased with step-through gait pattern today, also performed exercises.  Pt plans to d/c to SNF today.    Follow Up Recommendations  Post acute inpatient rehab    Barriers to Discharge        Equipment Recommendations  Rolling walker with 5" wheels;3 in 1 bedside comode    Recommendations for Other Services    Frequency     Plan Discharge plan remains appropriate    Precautions / Restrictions Precautions Precautions: None Restrictions Weight Bearing Restrictions: No Other Position/Activity Restrictions: WBAT   Pertinent Vitals/Pain 8/10 L hip pain during ambulation, premedicated, ice applied    Mobility  Bed Mobility Bed Mobility: Supine to Sit;Sit to Supine Supine to Sit: 5: Supervision Sit to Supine: 4: Min assist Details for Bed Mobility Assistance: pt able to assist L LE off bed with UEs however requires increased time, also increased time to attempt bringing onto bed and with pain so assisted a little onto bed Transfers Transfers: Sit to Stand;Stand to Sit Sit to Stand: 5: Supervision;With upper extremity assist;From bed Stand to Sit: 5: Supervision;With upper extremity assist;To bed Details for Transfer Assistance: verbal cues for safe technique Ambulation/Gait Ambulation/Gait Assistance: 5: Supervision Ambulation Distance (Feet): 120 Feet Assistive device: Rolling walker Ambulation/Gait Assistance Details: verbal cues for RW distance, rolling RW, pt able to step through gait pattern today Gait Pattern: Decreased stance time - left;Decreased step length - right;Step-through pattern;Antalgic Gait velocity: decreased    Exercises Total Joint Exercises Ankle Circles/Pumps: AROM;Both;20 reps Short Arc Quad: AROM;Left;10  reps;Strengthening Heel Slides: AAROM;Left;10 reps Hip ABduction/ADduction: AAROM;Left;10 reps   PT Diagnosis:    PT Problem List:   PT Treatment Interventions:     PT Goals Acute Rehab PT Goals PT Goal: Supine/Side to Sit - Progress: Partly met PT Goal: Sit to Supine/Side - Progress: Progressing toward goal Pt will go Sit to Stand: with modified independence PT Goal: Sit to Stand - Progress: Updated due to goal met Pt will go Stand to Sit: with modified independence PT Goal: Stand to Sit - Progress: Updated due to goals met Pt will Ambulate: >150 feet;with modified independence;with least restrictive assistive device PT Goal: Ambulate - Progress: Updated due to goal met PT Goal: Perform Home Exercise Program - Progress: Progressing toward goal  Visit Information  Last PT Received On: 06/17/12 Assistance Needed: +1    Subjective Data  Subjective: I'm gonna do it on my own today.  (moving LE with bed mobility)   Cognition  Overall Cognitive Status: Appears within functional limits for tasks assessed/performed    Balance     End of Session PT - End of Session Activity Tolerance: Patient tolerated treatment well Patient left: in bed;with call bell/phone within reach;with family/visitor present   GP     Taila Basinski,KATHrine E 06/17/2012, 11:30 AM Pager: [phone]

## 2012-06-18 NOTE — Progress Notes (Signed)
**Note De-Identified Dana Kennedy Obfuscation** Clinical Social Work Department CLINICAL SOCIAL WORK PLACEMENT NOTE 06/18/2012  Patient:  Dana Kennedy, Dana Kennedy  Account Number:  [id number] Admit date:  06/14/2012  Clinical Social Worker:  Leron Croak, CLINICAL SOCIAL WORKER  Date/time:  06/16/2012 03:03 PM  Clinical Social Work is seeking post-discharge placement for this patient at the following level of care:   SKILLED NURSING   (*CSW will update this form in Epic as items are completed)   06/15/2012  Patient/family provided with [facility] Department of Clinical Social Work's list of facilities offering this level of care within the geographic area requested by the patient (or if unable, by the patient's family).  06/15/2012  Patient/family informed of their freedom to choose among providers that offer the needed level of care, that participate in Medicare, Medicaid or managed care program needed by the patient, have an available bed and are willing to accept the patient.  06/15/2012  Patient/family informed of [facility]' ownership interest in [facility], as well as of the fact that they are under no obligation to receive care at this facility.  PASARR submitted to EDS on  PASARR number received from EDS on   FL2 transmitted to all facilities in geographic area requested by pt/family on  06/16/2012 FL2 transmitted to all facilities within larger geographic area on 06/16/2012  Patient informed that his/her managed care company has contracts with or will negotiate with  certain facilities, including the following:     Patient/family informed of bed offers received:  06/17/2012 Patient chooses bed at [facility] Physician recommends and patient chooses bed at    Patient to be transferred to [facility] on  06/17/2012 Patient to be transferred to facility by [facility]  The following physician request were entered in Epic:   Additional Comments:  Cori Razor LCSW [phone]

## 2012-07-09 ENCOUNTER — Ambulatory Visit: Payer: Medicaid Other | Admitting: Oncology

## 2012-07-09 ENCOUNTER — Other Ambulatory Visit: Payer: Medicaid Other | Admitting: Lab

## 2012-07-10 ENCOUNTER — Telehealth: Payer: Self-pay | Admitting: Oncology

## 2012-07-10 ENCOUNTER — Other Ambulatory Visit: Payer: Self-pay | Admitting: Oncology

## 2012-07-10 DIAGNOSIS — D693 Immune thrombocytopenic purpura: Secondary | ICD-10-CM

## 2012-07-10 NOTE — Telephone Encounter (Signed)
per pof from 10/23 please r/s missed appt,l/m with new appt    aom

## 2012-09-04 ENCOUNTER — Other Ambulatory Visit (HOSPITAL_BASED_OUTPATIENT_CLINIC_OR_DEPARTMENT_OTHER): Payer: Medicaid Other | Admitting: Lab

## 2012-09-04 ENCOUNTER — Telehealth: Payer: Self-pay | Admitting: Oncology

## 2012-09-04 ENCOUNTER — Encounter: Payer: Self-pay | Admitting: Oncology

## 2012-09-04 ENCOUNTER — Ambulatory Visit (HOSPITAL_BASED_OUTPATIENT_CLINIC_OR_DEPARTMENT_OTHER): Payer: Medicaid Other | Admitting: Oncology

## 2012-09-04 ENCOUNTER — Other Ambulatory Visit: Payer: Self-pay

## 2012-09-04 VITALS — BP 129/82 | HR 83 | Temp 97.8°F | Resp 18 | Ht 65.0 in | Wt 173.2 lb

## 2012-09-04 DIAGNOSIS — D693 Immune thrombocytopenic purpura: Secondary | ICD-10-CM

## 2012-09-04 DIAGNOSIS — D509 Iron deficiency anemia, unspecified: Secondary | ICD-10-CM

## 2012-09-04 DIAGNOSIS — J329 Chronic sinusitis, unspecified: Secondary | ICD-10-CM

## 2012-09-04 DIAGNOSIS — M329 Systemic lupus erythematosus, unspecified: Secondary | ICD-10-CM

## 2012-09-04 DIAGNOSIS — J019 Acute sinusitis, unspecified: Secondary | ICD-10-CM

## 2012-09-04 LAB — CBC WITH DIFFERENTIAL/PLATELET
Basophils Absolute: 0 10*3/uL (ref 0.0–0.1)
HCT: 37.2 % (ref 34.8–46.6)
HGB: 11.8 g/dL (ref 11.6–15.9)
MCH: 24.7 pg — ABNORMAL LOW (ref 25.1–34.0)
MONO#: 0.5 10*3/uL (ref 0.1–0.9)
NEUT%: 48.5 % (ref 38.4–76.8)
WBC: 5.3 10*3/uL (ref 3.9–10.3)
lymph#: 2.1 10*3/uL (ref 0.9–3.3)

## 2012-09-04 MED ORDER — GUAIFENESIN ER 1200 MG PO TB12: 1200.0000 mg | ORAL_TABLET | Freq: Two times a day (BID) | ORAL | Status: DC

## 2012-09-04 MED ORDER — AMOXICILLIN 500 MG PO CAPS: 500.0000 mg | ORAL_CAPSULE | Freq: Three times a day (TID) | ORAL | Status: DC

## 2012-09-04 NOTE — Telephone Encounter (Signed)
Gave patient appt for January 2014 lab and MD

## 2012-09-04 NOTE — Progress Notes (Signed)
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 She has no complaints that seem referable to the hematologic history, but has had upper respiratory infection for over 2 weeks. 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  Patient had presented in Dec 2011 with bleeding (vaginal, epistaxis, gums), with hemoglobin 5 and platelets <5K. This was determined to be blood loss anemia as well as AIHA, and ITP. She was diagnosed with lupus during this evaluation. She was treated with steroids and IVIG, then eventually resolved the thrombocytopenia with Rituxan, and has been followed with observation subsequently.   CBC Oct 2012 had Hgb 11.9, MCV 81 and plt 283k; CBC after hip surgery 06-17-12 had Hgb 8.2 and plt 149K. She missed appointment here in Oct of this year. 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   Patient has had sinus congestion x 2 weeks, with throat very sore and some cough. She was seen by PCP at onset of symptoms, not felt to need antibiotics then. She now has green, thick upper respiratory drainage. She had some fever initially. She is able to drink fluids but is having difficulty eating with the throat symptoms. She is not clearly short of breath, but generally feels badly. She has had no bleeding or unusual bruising, and has not had petechiae. No one at home is ill. She has done well since the hip surgery other than ongoing knee symptoms, but fell in the past week and has had more pain since then. Remainder of 10 point Review of Systems negative.  Objective:  Vital signs in last 24 hours:  BP 129/82  Pulse 83  Temp 97.8 F (36.6 C) (Oral)  Resp 18  Ht 5\' 5"  (1.651 m)  Wt 173 lb 3.2 oz  (78.563 kg)  BMI 28.82 kg/m2 Very congested, not coughing and not dyspneic during exam. Looks moderately ill, not acute distress.    HEENT:PERRLA and sclera clear, anicteric. Oral mucosa a little dry. Tonsillar areas bilaterally enlarged and erythematous without purulence. Nasal turbinates boggy. LymphaticsCervical, supraclavicular, and axillary nodes normal. Resp: clear to percussion, no wheezes or rales, no use of accessory muscles Cardio: regular rate and rhythm GI: soft, non-tender; bowel sounds normal; no masses,  no organomegaly Extremities: no edema, cords, tenderness. Skin without petechiae, ecchymoses or rash Neuro nonfocal  Lab Results:  Results for orders placed in visit on 09/04/12  CBC WITH DIFFERENTIAL      Component Value Range   WBC 5.3  3.9 - 10.3 10e3/uL   NEUT# 2.6  1.5 - 6.5 10e3/uL   HGB 11.8  11.6 - 15.9 g/dL   HCT 16.1  09.6 - 04.5 %   Platelets 71 Platelet count confirmed by slide estimate (*) 145 - 400 10e3/uL   MCV 78.0 (*) 79.5 - 101.0 fL   MCH 24.7 (*) 25.1 - 34.0 pg   MCHC 31.7  31.5 - 36.0 g/dL   RBC 4.09  8.11 - 9.14 10e6/uL   RDW 14.2  11.2 - 14.5 %   lymph# 2.1  0.9 - 3.3 10e3/uL   MONO# 0.5  0.1 - 0.9 10e3/uL   Eosinophils Absolute 0.1  0.0 - 0.5 10e3/uL  Basophils Absolute 0.0  0.0 - 0.1 10e3/uL   NEUT% 48.5  38.4 - 76.8 %   LYMPH% 39.8  14.0 - 49.7 %   MONO% 9.0  0.0 - 14.0 %   EOS% 2.3  0.0 - 7.0 %   BASO% 0.4  0.0 - 2.0 %   nRBC 0  0 - 0 %     Studies/Results:  No results found.  Medications: I have reviewed the patient's current medications. Will begin amoxicillin 250mg  tid x 10 days for acute sinusitis, and guaifenesin 1200 mg bid.  Assessment/Plan:  1.Hx ITP with platelets lower now, which may be due to recent viral URI as platelet count was normal with orthopedic surgery in Sept.  No bleeding or bruising. She will call if any other symptoms prior to repeating counts ~ 3 weeks 2.acute bacterial sinusitis: amoxicillin as  above, push fluids, follow up with PCP if symptoms do not improve. 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 Patient is aware that platelets will need to be improved before surgery 5.anemia: iron deficiency and possibly AIHA at presentation 6.hx CIN 1 and 2   LIVESAY,LENNIS P, MD   09/04/2012, 11:55 AM

## 2012-09-04 NOTE — Patient Instructions (Signed)
We will send antibiotic prescription to your pharmacy, to be started today. Push fluids (non caffeinated better). You should also use Mucinex (guaifenesin) 1200 mg twice daily to loosen secretions.

## 2012-09-05 DIAGNOSIS — D693 Immune thrombocytopenic purpura: Secondary | ICD-10-CM | POA: Insufficient documentation

## 2012-09-05 DIAGNOSIS — M329 Systemic lupus erythematosus, unspecified: Secondary | ICD-10-CM | POA: Insufficient documentation

## 2012-09-12 ENCOUNTER — Emergency Department (HOSPITAL_COMMUNITY)
Admission: EM | Admit: 2012-09-12 | Discharge: 2012-09-12 | Disposition: A | Discharge status: Home / Self Care | Payer: Medicaid Other | Attending: Emergency Medicine | Admitting: Emergency Medicine

## 2012-09-12 ENCOUNTER — Encounter (HOSPITAL_COMMUNITY): Payer: Self-pay | Admitting: Emergency Medicine

## 2012-09-12 DIAGNOSIS — IMO0002 Reserved for concepts with insufficient information to code with codable children: Secondary | ICD-10-CM | POA: Insufficient documentation

## 2012-09-12 DIAGNOSIS — R05 Cough: Secondary | ICD-10-CM | POA: Insufficient documentation

## 2012-09-12 DIAGNOSIS — J45909 Unspecified asthma, uncomplicated: Secondary | ICD-10-CM | POA: Insufficient documentation

## 2012-09-12 DIAGNOSIS — F17200 Nicotine dependence, unspecified, uncomplicated: Secondary | ICD-10-CM | POA: Insufficient documentation

## 2012-09-12 DIAGNOSIS — J029 Acute pharyngitis, unspecified: Secondary | ICD-10-CM | POA: Insufficient documentation

## 2012-09-12 DIAGNOSIS — Z8739 Personal history of other diseases of the musculoskeletal system and connective tissue: Secondary | ICD-10-CM | POA: Insufficient documentation

## 2012-09-12 DIAGNOSIS — R059 Cough, unspecified: Secondary | ICD-10-CM | POA: Insufficient documentation

## 2012-09-12 DIAGNOSIS — M129 Arthropathy, unspecified: Secondary | ICD-10-CM | POA: Insufficient documentation

## 2012-09-12 DIAGNOSIS — D649 Anemia, unspecified: Secondary | ICD-10-CM | POA: Insufficient documentation

## 2012-09-12 DIAGNOSIS — K219 Gastro-esophageal reflux disease without esophagitis: Secondary | ICD-10-CM | POA: Insufficient documentation

## 2012-09-12 DIAGNOSIS — Z79899 Other long term (current) drug therapy: Secondary | ICD-10-CM | POA: Insufficient documentation

## 2012-09-12 LAB — RAPID STREP SCREEN (MED CTR MEBANE ONLY): Streptococcus, Group A Screen (Direct): NEGATIVE

## 2012-09-12 MED ORDER — AMOXICILLIN-POT CLAVULANATE 875-125 MG PO TABS: 1.0000 | ORAL_TABLET | Freq: Two times a day (BID) | ORAL | Status: DC

## 2012-09-12 MED ORDER — ACETAMINOPHEN-CODEINE 120-12 MG/5ML PO SOLN: 10.0000 mL | ORAL | Status: DC | PRN

## 2012-09-12 NOTE — ED Provider Notes (Signed)
Medical screening examination/treatment/procedure(s) were performed by non-physician practitioner and as supervising physician I was immediately available for consultation/collaboration.   Lyanne Co, MD 09/12/12 [phone]

## 2012-09-12 NOTE — ED Provider Notes (Signed)
History   This chart was scribed for Ebbie Ridge, PA, by Leone Payor, ED Scribe. This patient was seen in room WTR6/WTR6 and the patient's care was started at 1220.   CSN: [id number]  Arrival date & time 09/12/12  1146   First MD Initiated Contact with Patient 09/12/12 1220      No chief complaint on file.    The history is provided by the patient. No language interpreter was used.    Dana Kennedy is a 46 y.o. female who presents to the Emergency Department complaining of an ongoing, unchanged, moderate sore throat starting over 1 week ago. Pt was seen on 09/04/12 and was prescribed amoxicillin and reports only taking 7/10 of the dose. Pt has associated cough, runny nose, fever,. ausea, vomiting, diarrhea. Pt denies headache, constipation, SOB. Pt denies HTN or DM  Pt has h/o asthma, lupus, GERD, stomach ulcer surgery.  Pt is a current everyday smoker and occasional alcohol user. Past Medical History  Diagnosis Date  . Lupus   . Asthma   . GERD (gastroesophageal reflux disease)   . Arthritis   . Anemia     Past Surgical History  Procedure Date  . Stomach ulcer surgery 2008  . Total hip arthroplasty 06/14/2012    Procedure: TOTAL HIP ARTHROPLASTY ANTERIOR APPROACH;  Surgeon: Kathryne Hitch, MD;  Location: [facility];  Service: Orthopedics;  Laterality: Left;  Left Total Hip Arthroplasty, Anterior Approach C-Arm    No family history on file.  History  Substance Use Topics  . Smoking status: Current Some Day Smoker -- 0.5 packs/day for 15 years    Types: Cigarettes  . Smokeless tobacco: Never Used  . Alcohol Use: Yes    No OB history provided.   Review of Systems  All other systems negative except as documented in the HPI. All pertinent positives and negatives as reviewed in the HPI.   Allergies  Eszopiclone  Home Medications   Current Outpatient Rx  Name  Route  Sig  Dispense  Refill  . ALBUTEROL SULFATE HFA 108 (90 BASE) MCG/ACT IN AERS  Inhalation   Inhale 2 puffs into the lungs every 6 (six) hours as needed. For shortness of breath         . AMOXICILLIN 500 MG PO CAPS   Oral   Take 500 mg by mouth 3 (three) times daily. Pt has been taking twice daily. Not 3 times daily like prescribed. For 10 therapy. Started on 09-04-12         . CETIRIZINE HCL 10 MG PO TABS   Oral   Take 10 mg by mouth every evening.          Marland Kitchen FERROUS SULFATE 325 (65 FE) MG PO TABS   Oral   Take 325 mg by mouth daily with breakfast.         . FOLIC ACID 1 MG PO TABS   Oral   Take 1 mg by mouth daily.         . GUAIFENESIN ER 1200 MG PO TB12   Oral   Take 1 tablet (1,200 mg total) by mouth 2 (two) times daily.   28 each   0   . MELOXICAM 15 MG PO TABS   Oral   Take 15 mg by mouth daily.         Marland Kitchen METHOCARBAMOL 500 MG PO TABS   Oral   Take 500 mg by mouth 4 (four) times daily.         Marland Kitchen  PANTOPRAZOLE SODIUM 40 MG PO TBEC   Oral   Take 40 mg by mouth every morning.          Marland Kitchen PREDNISONE 20 MG PO TABS   Oral   Take 20 mg by mouth 2 (two) times daily.         Marland Kitchen BISMUTH SUBSALICYLATE 262 MG PO CHEW   Oral   Chew 524 mg by mouth as needed. For upset stomach           There were no vitals taken for this visit.  Physical Exam  Nursing note and vitals reviewed. Constitutional: She is oriented to person, place, and time. She appears well-developed and well-nourished. No distress.  HENT:  Head: Normocephalic and atraumatic. No trismus in the jaw.  Right Ear: Tympanic membrane normal.  Left Ear: Tympanic membrane normal.  Nose: Mucosal edema present.  Mouth/Throat: Uvula is midline and mucous membranes are normal. No uvula swelling. Posterior oropharyngeal edema and posterior oropharyngeal erythema present. No oropharyngeal exudate or tonsillar abscesses.       3+ tonsils bilaterally. Uvula is midline. No peritonsillar abscess . Positive for cervical lymphadenopathy.   Eyes: EOM are normal. Pupils are equal,  round, and reactive to light. Right eye exhibits no discharge. Left eye exhibits no discharge.  Neck: Normal range of motion. Neck supple. No tracheal deviation present.  Cardiovascular: Normal rate, regular rhythm and normal heart sounds.   Pulmonary/Chest: Effort normal and breath sounds normal. No respiratory distress.  Musculoskeletal: Normal range of motion.  Neurological: She is alert and oriented to person, place, and time.  Skin: Skin is warm and dry.  Psychiatric: She has a normal mood and affect. Her behavior is normal.    ED Course  Procedures (including critical care time)  DIAGNOSTIC STUDIES: None performed.     COORDINATION OF CARE:  12:35 PM Discussed treatment plan which includes strep test with pt at bedside and pt agreed to plan.  The patient will be treated with pain control and will be treated with another antibiotic. The patient will receive steroids as well. The patient is handling her secretions at this time. The patient is breathing without difficulty. The patient is advised to return here as needed. Follow up with her PCP. Increase her fluid intake as well.   MDM    Carlyle Dolly, PA-C 09/12/12 1336

## 2012-09-12 NOTE — ED Notes (Signed)
Pt seen on 12/18 for same sx. Productive cough persists. C/o sore throat

## 2012-09-18 DIAGNOSIS — Z862 Personal history of diseases of the blood and blood-forming organs and certain disorders involving the immune mechanism: Secondary | ICD-10-CM

## 2012-09-18 HISTORY — DX: Personal history of diseases of the blood and blood-forming organs and certain disorders involving the immune mechanism: Z86.2

## 2012-09-30 ENCOUNTER — Telehealth: Payer: Self-pay | Admitting: Oncology

## 2012-09-30 ENCOUNTER — Ambulatory Visit (HOSPITAL_BASED_OUTPATIENT_CLINIC_OR_DEPARTMENT_OTHER): Payer: Medicaid Other | Admitting: Lab

## 2012-09-30 ENCOUNTER — Encounter: Payer: Self-pay | Admitting: Oncology

## 2012-09-30 ENCOUNTER — Ambulatory Visit (HOSPITAL_BASED_OUTPATIENT_CLINIC_OR_DEPARTMENT_OTHER): Payer: Medicaid Other | Admitting: Oncology

## 2012-09-30 VITALS — BP 129/87 | HR 94 | Temp 97.4°F | Resp 18 | Ht 65.0 in | Wt 176.9 lb

## 2012-09-30 DIAGNOSIS — D693 Immune thrombocytopenic purpura: Secondary | ICD-10-CM

## 2012-09-30 DIAGNOSIS — M329 Systemic lupus erythematosus, unspecified: Secondary | ICD-10-CM

## 2012-09-30 DIAGNOSIS — F17200 Nicotine dependence, unspecified, uncomplicated: Secondary | ICD-10-CM

## 2012-09-30 DIAGNOSIS — J019 Acute sinusitis, unspecified: Secondary | ICD-10-CM

## 2012-09-30 LAB — CBC WITH DIFFERENTIAL/PLATELET
Basophils Absolute: 0 10*3/uL (ref 0.0–0.1)
EOS%: 5 % (ref 0.0–7.0)
HCT: 33.6 % — ABNORMAL LOW (ref 34.8–46.6)
HGB: 11 g/dL — ABNORMAL LOW (ref 11.6–15.9)
MCH: 24.6 pg — ABNORMAL LOW (ref 25.1–34.0)
MCV: 75.3 fL — ABNORMAL LOW (ref 79.5–101.0)
MONO%: 10.6 % (ref 0.0–14.0)
NEUT%: 45.3 % (ref 38.4–76.8)

## 2012-09-30 LAB — MORPHOLOGY

## 2012-09-30 NOTE — Progress Notes (Signed)
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 She has continued with symptoms of upper respiratory infection despite amoxicillin x 10 days begun Dec 18, Augmentin x 10 days begun Dec 26 and clindamycin begun Jan 8 planned for 10 days; she has been seen at least twice by Dr Tyson Dense, including last week,  and at ED on 09-12-12, at which time rapid strep was negative. She has had no bleeding or bruising.   Patient  presented in Dec 2011 with bleeding (vaginal, epistaxis, gums), with hemoglobin 5 and platelets <5K. This was determined to be blood loss anemia as well as AIHA, and ITP. She was diagnosed with lupus during this evaluation. She was treated with steroids and IVIG, then eventually resolved the thrombocytopenia with Rituxan, and has been followed with observation subsequently.  She has sinus congestion and drainage of yellow secretions, without bleeding. She has cough which seems from the nasal drainage, without lower chest symptoms. She has not had fever in past week. She is trying to push fluids, is not on mucinex now. She has had no steroids.She denies chest pain, diarrhea or other GI symptoms. Throat is still sore. Husband had short URI illness, no one else at home sick. Remainder of 10 point Review of Systems negative.  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 Objective:  Vital signs in last 24 hours:  BP 129/87  Pulse 94  Temp 97.4 F (36.3 C) (Oral)  Resp 18  Ht 5\' 5"  (1.651 m)  Wt 176 lb 14.4 oz (80.241 kg)  BMI 29.44 kg/m2  LMP 09/12/2012 Weight is up 3 lbs from 09-05-12. Looks uncomfortable, nasally congested, no cough, ambulatory without assistance.   HEENT:PERRLA and extra  ocular movement intact. Posterior pharynx with bilateral tonsillar enlargement and erythema, tho a little better than when I saw her in Dec, no exudate. Mucous membranes moist. TMs without erythema. LymphaticsCervical, supraclavicular, and axillary nodes normal. Resp: clear to auscultation bilaterally and normal percussion bilaterally Cardio: regular rate and rhythm GI: soft, non-tender; bowel sounds normal; no masses,  no organomegaly Extremities: no edema, cords, tenderness Skin without ecchymosis or petechiae  Lab Results:  Results for orders placed in visit on 09/30/12  CBC WITH DIFFERENTIAL      Component Value Range   WBC 5.9  3.9 - 10.3 10e3/uL   NEUT# 2.7  1.5 - 6.5 10e3/uL   HGB 11.0 (*) 11.6 - 15.9 g/dL   HCT 14.7 (*) 82.9 - 56.2 %   Platelets 58 (*) 145 - 400 10e3/uL   MCV 75.3 (*) 79.5 - 101.0 fL   MCH 24.6 (*) 25.1 - 34.0 pg   MCHC 32.7  31.5 - 36.0 g/dL   RBC 1.30  8.65 - 7.84 10e6/uL   RDW 15.0 (*) 11.2 - 14.5 %   lymph# 2.3  0.9 - 3.3 10e3/uL   MONO# 0.6  0.1 - 0.9 10e3/uL   Eosinophils Absolute 0.3  0.0 - 0.5 10e3/uL   Basophils Absolute 0.0  0.0 - 0.1 10e3/uL   NEUT% 45.3  38.4 - 76.8 %   LYMPH% 38.5  14.0 - 49.7 %   MONO% 10.6  0.0 - 14.0 %   EOS% 5.0  0.0 -  7.0 %   BASO% 0.6  0.0 - 2.0 %  MORPHOLOGY      Component Value Range   RBC Comments Within Normal Limits  Within Normal Limits   White Cell Comments C/W auto diff     Other Comments Few Variant Lymphs     PLT EST Decreased  Adequate   Platelet Morphology Large Plts, Occas Giant Plt  Within Normal Limits     Studies/Results:  No results found.  Medications: I have reviewed the patient's current medications. I have contacted [facility] and [facility] with information about antibiotics as above.  I have tried to refer to ENT now, however her [place] Access Medicaid requires referral by PCP.  Assessment/Plan: 1.Thrombocytopenia without bleeding, morphology consistent with recurrent ITP,  which could have been precipitated by this respiratory illness. As she is being treated for persistent acute sinusitis, I am reluctant to begin steroids unless that seems appropriate also from standpoint of the upper respiratory problems. I have requested follow up with PCP, and consideration of referral to ENT if that seems appropriate. 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    Kennedy,Dana P, MD   09/30/2012, 1:41 PM

## 2012-09-30 NOTE — Patient Instructions (Addendum)
STOP  SMOKING  Mucinex (guaifenesin) 600 mg: take 2 tablets twice daily to loosen secretions. Drink LOTS of fluids.  Dr Tyson Dense will need to refer you to ENT. I will ask him to see you back hopefully end of this week, which should be at finish of present antibiotics.

## 2012-09-30 NOTE — Telephone Encounter (Signed)
appts made and printed for pt,called dr Tyson Dense office and recd a vm,they advised not to leave a meassage but to call back in a few minutes,pt will call herself     Dana Kennedy

## 2012-10-14 ENCOUNTER — Other Ambulatory Visit: Payer: Medicaid Other | Admitting: Lab

## 2012-10-14 ENCOUNTER — Ambulatory Visit: Payer: Medicaid Other | Admitting: Oncology

## 2012-10-15 ENCOUNTER — Telehealth: Payer: Self-pay | Admitting: Oncology

## 2012-10-15 NOTE — Telephone Encounter (Signed)
Gave pt appt calendar today for February 2014 lab and MD

## 2012-10-15 NOTE — Telephone Encounter (Signed)
Gave pt appt for lab and MD r/s from 10/14/12 [facility]

## 2012-10-16 ENCOUNTER — Encounter: Payer: Self-pay | Admitting: Oncology

## 2012-10-16 ENCOUNTER — Telehealth: Payer: Self-pay | Admitting: Oncology

## 2012-10-16 ENCOUNTER — Other Ambulatory Visit (HOSPITAL_BASED_OUTPATIENT_CLINIC_OR_DEPARTMENT_OTHER): Payer: Medicaid Other

## 2012-10-16 ENCOUNTER — Ambulatory Visit (HOSPITAL_BASED_OUTPATIENT_CLINIC_OR_DEPARTMENT_OTHER): Payer: Medicaid Other | Admitting: Oncology

## 2012-10-16 ENCOUNTER — Other Ambulatory Visit: Payer: Self-pay

## 2012-10-16 VITALS — BP 134/89 | HR 88 | Temp 98.3°F | Resp 18 | Ht 64.0 in | Wt 175.1 lb

## 2012-10-16 DIAGNOSIS — D693 Immune thrombocytopenic purpura: Secondary | ICD-10-CM

## 2012-10-16 DIAGNOSIS — D591 Autoimmune hemolytic anemia, unspecified: Secondary | ICD-10-CM

## 2012-10-16 DIAGNOSIS — F17200 Nicotine dependence, unspecified, uncomplicated: Secondary | ICD-10-CM

## 2012-10-16 LAB — CBC WITH DIFFERENTIAL/PLATELET
Eosinophils Absolute: 0.1 10*3/uL (ref 0.0–0.5)
HCT: 34 % — ABNORMAL LOW (ref 34.8–46.6)
LYMPH%: 38.6 % (ref 14.0–49.7)
MONO#: 0.4 10*3/uL (ref 0.1–0.9)
NEUT#: 2.5 10*3/uL (ref 1.5–6.5)
Platelets: 33 10*3/uL — ABNORMAL LOW (ref 145–400)
RBC: 4.45 10*6/uL (ref 3.70–5.45)
WBC: 5 10*3/uL (ref 3.9–10.3)
lymph#: 1.9 10*3/uL (ref 0.9–3.3)
nRBC: 0 % (ref 0–0)

## 2012-10-16 LAB — RETICULOCYTES
Immature Retic Fract: 13.1 % — ABNORMAL HIGH (ref 1.60–10.00)
RBC: 4.45 10*6/uL (ref 3.70–5.45)

## 2012-10-16 LAB — COMPREHENSIVE METABOLIC PANEL (CC13)
ALT: 15 U/L (ref 0–55)
Albumin: 3.2 g/dL — ABNORMAL LOW (ref 3.5–5.0)
CO2: 24 mEq/L (ref 22–29)
Calcium: 8.9 mg/dL (ref 8.4–10.4)
Chloride: 108 mEq/L — ABNORMAL HIGH (ref 98–107)
Creatinine: 0.8 mg/dL (ref 0.6–1.1)
Potassium: 3.9 mEq/L (ref 3.5–5.1)
Total Protein: 6.8 g/dL (ref 6.4–8.3)

## 2012-10-16 MED ORDER — FOLIC ACID 1 MG PO TABS: 1.0000 mg | ORAL_TABLET | Freq: Every day | ORAL | Status: DC

## 2012-10-16 MED ORDER — NICOTINE 14 MG/24HR TD PT24: 1.0000 | MEDICATED_PATCH | TRANSDERMAL | Status: DC

## 2012-10-16 MED ORDER — PREDNISONE 20 MG PO TABS: ORAL_TABLET | ORAL | Status: DC

## 2012-10-16 NOTE — Telephone Encounter (Signed)
appts made and printed for pt aom °

## 2012-10-16 NOTE — Patient Instructions (Signed)
We will send prescriptions for prednisone, folate and nicotine patches to [facility]. We will check your blood counts weekly for now.

## 2012-10-16 NOTE — Progress Notes (Signed)
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  Patient presented in Dec 2011 with bleeding (vaginal, epistaxis, gums), with hemoglobin 5 and platelets <5K. This was determined to be blood loss anemia as well as AIHA, and ITP. She was diagnosed with lupus during this evaluation. She was treated with steroids and IVIG initially, then eventually resolved the thrombocytopenia with Rituxan. 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  She had acute sinusitis beginning in Dec 2013, treated with at least 3 different full rounds of antibiotics. At regularly scheduled visit at this office 1218-13 platelet count was 78k with Hgb 11.8. She continued with acute sinusitis symptoms at follow up visit 09-30-12, with platelets 58k and Hgb 11. She saw Dr Tyson Dense again since she was here last, referral to ENT planned but may not be set up yet.   Patient finally feels somewhat better, with throat not sore and no purulent sinus drainage tho she still has congestion. She has had no fever and much less cough, NP. She has had no bleeding or bruising, tho does recall menstrual period in Dec was prolonged and heavier than usual. Appetite is good. 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 She has decreased smoking down to 1-2 cigarettes/ 24 hours and I have strongly encouraged her to stop entirely. Remainder of 10 point Review of Systems negative.  Objective:  Vital signs in last 24 hours:  BP 134/89  Pulse 88  Temp 98.3 F (36.8 C) (Oral)  Resp 18  Ht 5\' 4"  (1.626 m)  Wt 175 lb 1.6 oz (79.425 kg)  BMI 30.06 kg/m2 Weight is down 1 lb.  Still sounds nasally congested but looks much more comfortable overall. Easily mobile, respirations otherwise not labored, occasional NP cough.   HEENT:PERRLA and sclera clear, anicteric Oral mucosa moist without thrush. Tonsils enlarged and slightly erythematous bilaterally, without purulent exudate, a little better than last exam. TMs with good light reflexes, very slight erythema distal canal on right.Nasal turbinates boggy with some clear rhinorrhea, but better. Lymphatics No supraclavicular or axillary adenopathy Resp: clear to auscultation bilaterally and normal percussion bilaterally Cardio: regular rate and rhythm GI: soft, non-tender; bowel sounds normal; no masses,  no organomegaly Extremities: no clubbing, cyanosis, edema, tenderness to palpation or cords Neuro:nonfocal Skin without ecchymosis or petechiae trunk, extremities, head/neck.  Lab Results:  Results for orders placed in visit on 10/16/12  CBC WITH DIFFERENTIAL      Component Value Range   WBC 5.0  3.9 - 10.3 10e3/uL   NEUT# 2.5  1.5 - 6.5 10e3/uL   HGB 10.7 (*) 11.6 - 15.9 g/dL   HCT 16.1 (*) 09.6 - 04.5 %   Platelets 33 (*) 145 - 400 10e3/uL   MCV 76.4 (*) 79.5 - 101.0 fL   MCH 24.0 (*) 25.1 - 34.0 pg   MCHC 31.5  31.5 - 36.0 g/dL   RBC 4.09  8.11 - 9.14 10e6/uL   RDW 15.6 (*) 11.2 - 14.5 %   lymph# 1.9  0.9 - 3.3 10e3/uL   MONO# 0.4  0.1 - 0.9 10e3/uL   Eosinophils Absolute 0.1  0.0 - 0.5 10e3/uL   Basophils Absolute 0.0  0.0 - 0.1 10e3/uL   NEUT% 50.8  38.4 - 76.8 %   LYMPH% 38.6  14.0 - 49.7 %   MONO% 7.6  0.0 - 14.0 %   EOS% 2.6  0.0 - 7.0 %   BASO% 0.4  0.0 - 2.0 %   nRBC 0  0 - 0 %  COMPREHENSIVE METABOLIC PANEL (CC13)      Component Value Range   Sodium 141  136 - 145 mEq/L   Potassium 3.9  3.5 - 5.1 mEq/L   Chloride 108 (*) 98 - 107 mEq/L   CO2 24  22 - 29 mEq/L   Glucose 109 (*) 70 - 99 mg/dl   BUN 08.6  7.0 - 57.8 mg/dL   Creatinine 0.8  0.6 - 1.1 mg/dL   Total Bilirubin <4.69 Repeated and  Verified  0.20 - 1.20 mg/dL   Alkaline Phosphatase 82  40 - 150 U/L   AST 12  5 - 34 U/L   ALT 15  0 - 55 U/L   Total Protein 6.8  6.4 - 8.3 g/dL   Albumin 3.2 (*) 3.5 - 5.0 g/dL   Calcium 8.9  8.4 - 62.9 mg/dL    Large platelets noted on morphology last visit  Studies/Results:  No results found.  Medications: I have reviewed the patient's current medications. She continues oral iron. She will resume oral folate. She will start prednisone 1 mg/kg as 60 mg with breakfast and 20 mg with lunch.  Assessment/Plan:  1.1.Thrombocytopenia without bleeding, morphology consistent with recurrent ITP, possibly exacerbated by recent respiratory illness.Sinus symptoms do not seem to be an acute infection now and I will begin prednisone as above. I have encouraged her to establish with ENT if possible.  2.previous AIHA and iron deficiency anemia: note heavier menses in Dec. Will check iron studies and retic count/ LDH, resume folate and follow on the prednisone. 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 Symptoms preceded any steroids for ITP in 2011. Arthritis knees, left knee surgery on hold with present problems 4.no flu vaccine documented in EMR  5.hx CIN 1 and 2, followed by gyn 6.long tobacco: previously stopped, then resumed. Has cut back significantly and will try to stop. Hopefully she can obtain nicotine patches as these helped before. 7.systemic lupus: I believe followed by PCP now  Patient and husband followed discussion well and had questions answered to their satisfaction. She will have CBC in one week and I will see her in two weeks, or sooner if any bleeding or other problems.  Dana Kennedy P, MD   10/16/2012, 2:56 PM

## 2012-10-23 ENCOUNTER — Other Ambulatory Visit: Payer: Medicaid Other | Admitting: Lab

## 2012-10-23 ENCOUNTER — Telehealth: Payer: Self-pay | Admitting: Oncology

## 2012-10-24 ENCOUNTER — Telehealth: Payer: Self-pay

## 2012-10-24 ENCOUNTER — Other Ambulatory Visit (HOSPITAL_BASED_OUTPATIENT_CLINIC_OR_DEPARTMENT_OTHER): Payer: Medicaid Other

## 2012-10-24 ENCOUNTER — Encounter: Payer: Self-pay | Admitting: Oncology

## 2012-10-24 DIAGNOSIS — D693 Immune thrombocytopenic purpura: Secondary | ICD-10-CM

## 2012-10-24 LAB — WHOLE BLOOD GLUCOSE
Glucose: 124 mg/dL — ABNORMAL HIGH (ref 70–100)
HRS PC: 1 Hours

## 2012-10-24 LAB — CBC & DIFF AND RETIC
EOS%: 1.4 % (ref 0.0–7.0)
LYMPH%: 42.5 % (ref 14.0–49.7)
MCH: 24.2 pg — ABNORMAL LOW (ref 25.1–34.0)
MCV: 77.2 fL — ABNORMAL LOW (ref 79.5–101.0)
MONO%: 8.4 % (ref 0.0–14.0)
Platelets: 199 10*3/uL (ref 145–400)
RBC: 4.51 10*6/uL (ref 3.70–5.45)
RDW: 16.9 % — ABNORMAL HIGH (ref 11.2–14.5)
Retic %: 2 % (ref 0.70–2.10)
nRBC: 0 % (ref 0–0)

## 2012-10-24 LAB — IRON AND TIBC
%SAT: 5 % — ABNORMAL LOW (ref 20–55)
TIBC: 426 ug/dL (ref 250–470)
UIBC: 404 ug/dL — ABNORMAL HIGH (ref 125–400)

## 2012-10-24 LAB — FERRITIN: Ferritin: 20 ng/mL (ref 10–291)

## 2012-10-24 NOTE — Progress Notes (Signed)
Checked in patient for lab. She wants to see Dr. Precious Reel nurse. I put note in system.

## 2012-10-24 NOTE — Telephone Encounter (Addendum)
Spoke with Dana Kennedy and told her that her platelet count was up to 199 K  today.  Dr. Darrold Span wants to decrease  her prednisone from 80 mg daily to 60 mg with breakfast until lab and visit on 10-30-12. Patient verbalized understanding.

## 2012-10-24 NOTE — Progress Notes (Signed)
Patient came in and said she still had Medicaid [place]. I called to verify and she had on file her son's card, not hers. She does have [place] [facility] and she is active till 07/18/13. She needs to bring her card, not her son Joshua's card. I will call and note in system for next visit.

## 2012-10-25 NOTE — Telephone Encounter (Signed)
Ms. Dana Kennedy came to office requestin prescription for percocet.  This med is not on her med list.  Dr. Kellie Simmering could not prescribed due to Dr. Darrold Span prescribing decadron per patient. Suggested that Dana Kennedy her PCP for medication.  If PCP has any questions about decadron dosing, he can Kennedy and speak with Dr. Darrold Span.  Pt. Verbalized understanding.

## 2012-10-30 ENCOUNTER — Ambulatory Visit: Payer: Medicaid Other | Admitting: Oncology

## 2012-10-30 ENCOUNTER — Other Ambulatory Visit: Payer: Medicaid Other

## 2012-11-01 ENCOUNTER — Telehealth: Payer: Self-pay

## 2012-11-01 NOTE — Telephone Encounter (Signed)
Left message for Ms. Fields stating that Dr. Lorra Hals like to have a repeat cbc as soon as possible due to being on steroids for her platelets, especially since she was not able to keep appointment 10-30-12 due to inclement weather. Requested that she call back to see if she could come for labs today or Monday 11-04-12.  She needs to keep appt. On 11-05-12 as scheduled but a cbc to evaluate the steroid dosing would be optimal.

## 2012-11-04 NOTE — Telephone Encounter (Signed)
No return call received  from Ms. Fields.

## 2012-11-05 ENCOUNTER — Ambulatory Visit (HOSPITAL_BASED_OUTPATIENT_CLINIC_OR_DEPARTMENT_OTHER): Payer: Medicaid Other | Admitting: Oncology

## 2012-11-05 ENCOUNTER — Encounter: Payer: Self-pay | Admitting: Oncology

## 2012-11-05 ENCOUNTER — Other Ambulatory Visit: Payer: Self-pay

## 2012-11-05 ENCOUNTER — Other Ambulatory Visit (HOSPITAL_BASED_OUTPATIENT_CLINIC_OR_DEPARTMENT_OTHER): Payer: Medicaid Other | Admitting: Lab

## 2012-11-05 VITALS — BP 121/78 | HR 104 | Temp 98.9°F | Resp 20 | Ht 64.0 in | Wt 178.1 lb

## 2012-11-05 DIAGNOSIS — J069 Acute upper respiratory infection, unspecified: Secondary | ICD-10-CM

## 2012-11-05 DIAGNOSIS — R197 Diarrhea, unspecified: Secondary | ICD-10-CM

## 2012-11-05 DIAGNOSIS — D693 Immune thrombocytopenic purpura: Secondary | ICD-10-CM

## 2012-11-05 DIAGNOSIS — D509 Iron deficiency anemia, unspecified: Secondary | ICD-10-CM

## 2012-11-05 LAB — WHOLE BLOOD GLUCOSE: Glucose: 112 mg/dL — ABNORMAL HIGH (ref 70–100)

## 2012-11-05 LAB — CBC & DIFF AND RETIC
Eosinophils Absolute: 0.2 10*3/uL (ref 0.0–0.5)
HCT: 35.2 % (ref 34.8–46.6)
Immature Retic Fract: 9.6 % (ref 1.60–10.00)
LYMPH%: 34 % (ref 14.0–49.7)
MCHC: 31 g/dL — ABNORMAL LOW (ref 31.5–36.0)
MCV: 77.9 fL — ABNORMAL LOW (ref 79.5–101.0)
MONO#: 0.5 10*3/uL (ref 0.1–0.9)
MONO%: 7.1 % (ref 0.0–14.0)
NEUT#: 4.2 10*3/uL (ref 1.5–6.5)
NEUT%: 56.6 % (ref 38.4–76.8)
Platelets: 233 10*3/uL (ref 145–400)
RBC: 4.52 10*6/uL (ref 3.70–5.45)
WBC: 7.5 10*3/uL (ref 3.9–10.3)
nRBC: 0 % (ref 0–0)

## 2012-11-05 MED ORDER — CETIRIZINE HCL 10 MG PO TABS: 10.0000 mg | ORAL_TABLET | Freq: Every day | ORAL | Status: DC

## 2012-11-05 MED ORDER — HYDROCODONE-HOMATROPINE 5-1.5 MG/5ML PO SYRP: 5.0000 mL | ORAL_SOLUTION | Freq: Four times a day (QID) | ORAL | Status: DC | PRN

## 2012-11-05 MED ORDER — AMOXICILLIN 500 MG PO TABS: 500.0000 mg | ORAL_TABLET | Freq: Three times a day (TID) | ORAL | Status: DC

## 2012-11-05 NOTE — Progress Notes (Signed)
OFFICE PROGRESS NOTE   11/05/2012   Physicians:E.Avbuerre, B.Marshall, D.Blackmon   INTERVAL HISTORY:   Patient is seen, together with family member and her 72 mo old granddaughter, in continuing attention to recurrent thrombocytopenia with history of ITP.  She has been on prednisone since 10-16-12, this at 1 mg/kg/day (80 mg/day) until 10-24-12 and on 60 mg/day since then. This appointment was rescheduled due to weather.  Patient presented in Dec 2011 with bleeding (vaginal, epistaxis, gums), with hemoglobin 5 and platelets <5K. This was determined to be blood loss anemia as well as AIHA, and ITP. She was diagnosed with lupus during this evaluation. She was treated with steroids and IVIG initially, then eventually resolved the thrombocytopenia with Rituxan. 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  She had acute sinusitis beginning in Dec 2013, treated with at least 3 different full rounds of antibiotics. At regularly scheduled visit at this office 1218-13 platelet count was 78k with Hgb 11.8. She continued with acute sinusitis symptoms at follow up visit 09-30-12, with platelets 58k and Hgb 11. Steroids were begun 10-16-12 with Hgb 10.7 and plt 33k; she did not seem to be hemolyzing then with normal LDH and bilirubin.  By 10-24-12 platelets were 199k and Hgb 10.9. She has had no bleeding or bruising. Sinus symptoms had improved to baseline until ~ 5 days ago when she again has had sinus congestion and drainage with enlarged and sore tonsils and some cough without shortness of breath or fever (on the steroids). She is scheduled to see ENT for new patient evaluation on 11-11-12.  Patient also complains of diarrhea intermittently for past 2 months. She was unable to get specimen for C diff while at office and will return stool specimen. We discussed pushing po fluids including gatorade. She has had no N/V.  She is having no problems with the left hip replacement (done fall 2013 for AVN), but more discomfort knees. Remainder of 10 point Review of Systems negative/ unchanged..  Objective:  Vital signs in last 24 hours:  BP 121/78  Pulse 104  Temp(Src) 98.9 F (37.2 C) (Oral)  Resp 20  Ht 5\' 4"  (1.626 m)  Wt 178 lb 1.6 oz (80.786 kg)  BMI 30.56 kg/m2  Weight is up 3 lbs since starting steroids. Sounds nasally congested. Some NP cough. Not dyspneic. Alert, talkative, appropriate.  HEENT:PERRLA, sclera clear, anicteric and tonsillar tissue bulky with some erythema bilaterally, no exudate. Nasal turbinates boggy, no bleeding. LymphaticsCervical, supraclavicular, and axillary nodes normal. Resp: clear to auscultation bilaterally and normal percussion bilaterally Cardio: regular rate and rhythm GI: soft, non-tender; bowel sounds normal; no masses,  no organomegaly Extremities: extremities normal, atraumatic, no cyanosis or edema Neuro:nonfocal Skin without rash or ecchymosis  Lab Results:  Results for orders placed in visit on 11/05/12  CBC & DIFF AND RETIC      Result Value Range   WBC 7.5  3.9 - 10.3 10e3/uL   NEUT# 4.2  1.5 - 6.5 10e3/uL   HGB 10.9 (*) 11.6 - 15.9 g/dL   HCT 45.4  09.8 - 11.9 %   Platelets 233  145 - 400 10e3/uL   MCV 77.9 (*) 79.5 - 101.0 fL   MCH 24.1 (*) 25.1 - 34.0 pg   MCHC 31.0 (*) 31.5 - 36.0 g/dL   RBC 1.47  8.29 - 5.62 10e6/uL   RDW 17.3 (*) 11.2 - 14.5 %   lymph#  2.6  0.9 - 3.3 10e3/uL   MONO# 0.5  0.1 - 0.9 10e3/uL   Eosinophils Absolute 0.2  0.0 - 0.5 10e3/uL   Basophils Absolute 0.0  0.0 - 0.1 10e3/uL   NEUT% 56.6  38.4 - 76.8 %   LYMPH% 34.0  14.0 - 49.7 %   MONO% 7.1  0.0 - 14.0 %   EOS% 2.0  0.0 - 7.0 %   BASO% 0.3  0.0 - 2.0 %   nRBC 0  0 - 0 %   Retic % 1.24  0.70 - 2.10 %   Retic Ct Abs 56.05  33.70 - 90.70 10e3/uL   Immature Retic Fract 9.60  1.60 - 10.00 %  WHOLE BLOOD GLUCOSE      Result Value Range   Glucose 112 (*) 70 - 100  mg/dL   HRS PC not fasting      Stool for C diff pending Studies/Results:  No results found.  Medications: I have reviewed the patient's current medications. She will decrease prednisone to 20 mg x 3 days beginning 11-06-12, then 10 mg daily until recheck CBC next week. Prescriptions for amoxicillin500 tid x 7 days, hycodan and zyrtec sent to pharmacy.  Assessment/Plan: 1.Thrombocytopenia without bleeding, morphology consistent with recurrent ITP, possibly exacerbated by recent respiratory illness(es). Platelets rapidly increased with steroids and I will try to taper off quickly if tolerates.  2.previous AIHA and iron deficiency anemia: note heavier menses in Dec and hip replacement in Nov.  Iron studies low on 10-24-12 ( iron 22. %sat 7 and ferritin 20). I have encouraged her to continue oral iron and may need to consider IV replacement. 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 Symptoms preceded any steroids for ITP in 2011. Arthritis knees, left knee surgery on hold with present problems  4.no flu vaccine due to all of the respiratory infections 5.hx CIN 1 and 2, followed by gyn  6.long tobacco: previously stopped, then resumed. Has cut back significantly and will try to stop. Hopefully she can obtain nicotine patches as these helped before.  7.systemic lupus: known to Dr Nickola Major 8. Recurrent upper respiratory infections, with bulky tonsils. For ENT evaluation next week.   We will recheck CBC ~ 2-25 and I will see her with CBC early March, or sooner if needed.     LIVESAY,LENNIS P, MD   11/05/2012, 5:30 PM

## 2012-11-05 NOTE — Patient Instructions (Addendum)
Prednisone 20 mg = 1 tablet in AM Feb 19, 20, 21 then 1/2 tablet daily starting Feb 22 until we recheck blood counts next week -- we will let you know what to do from there.  Other prescriptions called to your pharmacy.

## 2012-11-07 ENCOUNTER — Other Ambulatory Visit: Payer: Self-pay | Admitting: Oncology

## 2012-11-08 ENCOUNTER — Telehealth: Payer: Self-pay | Admitting: Oncology

## 2012-11-08 NOTE — Telephone Encounter (Signed)
LMONVM OF THE PT REGARDING HER LAB APPT ON 11/12/2012 AND TO PICK UP A SCHEDULE FOR HER MARCH APPTS WHILE SHE IS IN THE BLDG

## 2012-11-12 ENCOUNTER — Other Ambulatory Visit (HOSPITAL_BASED_OUTPATIENT_CLINIC_OR_DEPARTMENT_OTHER): Payer: Medicaid Other | Admitting: Lab

## 2012-11-12 ENCOUNTER — Telehealth: Payer: Self-pay

## 2012-11-12 ENCOUNTER — Telehealth: Payer: Self-pay | Admitting: Oncology

## 2012-11-12 DIAGNOSIS — D693 Immune thrombocytopenic purpura: Secondary | ICD-10-CM

## 2012-11-12 DIAGNOSIS — R197 Diarrhea, unspecified: Secondary | ICD-10-CM

## 2012-11-12 LAB — CBC WITH DIFFERENTIAL/PLATELET
Basophils Absolute: 0 10*3/uL (ref 0.0–0.1)
Eosinophils Absolute: 0.2 10*3/uL (ref 0.0–0.5)
HGB: 11.6 g/dL (ref 11.6–15.9)
MONO#: 0.5 10*3/uL (ref 0.1–0.9)
NEUT#: 2.2 10*3/uL (ref 1.5–6.5)
RBC: 4.62 10*6/uL (ref 3.70–5.45)
RDW: 17.8 % — ABNORMAL HIGH (ref 11.2–14.5)
WBC: 5 10*3/uL (ref 3.9–10.3)
lymph#: 2.1 10*3/uL (ref 0.9–3.3)

## 2012-11-12 LAB — CLOSTRIDIUM DIFFICILE BY PCR: Toxigenic C. Difficile by PCR: NEGATIVE

## 2012-11-12 NOTE — Telephone Encounter (Signed)
Left message for Ms Dana Kennedy to call about her lab results and prednisone dosing.

## 2012-11-12 NOTE — Telephone Encounter (Signed)
pt came in to get her appts printed.Marland Kitchenaptt printed for3/01/2013 for lab and md visit....td

## 2012-11-13 NOTE — Telephone Encounter (Addendum)
Spoke with Ms Dana Kennedy and told her that her plt. count from 11-12-12 was good at 276k.  Dr. Darrold Span said to stop the prednisone and keep appointment 11-20-12 as scheduled.  Tokd her that her stool was negative for c-diff as noted below by Dr. Darrold Span.  Labs seen and need follow up: please let her know C diff negative      Attached to

## 2012-11-20 ENCOUNTER — Telehealth: Payer: Self-pay | Admitting: Oncology

## 2012-11-20 ENCOUNTER — Other Ambulatory Visit: Payer: Medicaid Other | Admitting: Lab

## 2012-11-20 ENCOUNTER — Ambulatory Visit: Payer: Medicaid Other | Admitting: Oncology

## 2012-12-03 ENCOUNTER — Other Ambulatory Visit: Payer: Medicaid Other | Admitting: Lab

## 2012-12-03 ENCOUNTER — Ambulatory Visit: Payer: Medicaid Other | Admitting: Oncology

## 2012-12-06 ENCOUNTER — Telehealth: Payer: Self-pay

## 2012-12-06 NOTE — Telephone Encounter (Signed)
This is the second attempt to contact Ms. Fields to r/s missed appt. on 12-03-12 due to inclement weather.  Person not available on phone.. Mobile has voice mail but name is not patient's name or same last name. Will send a pof to scheduling to make appt. In the next 2-3 weeks if possible.

## 2012-12-09 ENCOUNTER — Telehealth: Payer: Self-pay | Admitting: Oncology

## 2012-12-09 NOTE — Telephone Encounter (Signed)
, °

## 2012-12-15 ENCOUNTER — Emergency Department (HOSPITAL_COMMUNITY): Payer: Medicaid Other

## 2012-12-15 ENCOUNTER — Encounter (HOSPITAL_COMMUNITY): Payer: Self-pay | Admitting: Emergency Medicine

## 2012-12-15 ENCOUNTER — Inpatient Hospital Stay (HOSPITAL_COMMUNITY)
Admission: EM | Admit: 2012-12-15 | Discharge: 2012-12-18 | DRG: 813 | Disposition: A | Discharge status: Home / Self Care | Payer: Medicaid Other | Attending: Internal Medicine | Admitting: Internal Medicine

## 2012-12-15 DIAGNOSIS — M129 Arthropathy, unspecified: Secondary | ICD-10-CM | POA: Diagnosis present

## 2012-12-15 DIAGNOSIS — D693 Immune thrombocytopenic purpura: Principal | ICD-10-CM | POA: Diagnosis present

## 2012-12-15 DIAGNOSIS — D6941 Evans syndrome: Secondary | ICD-10-CM | POA: Diagnosis present

## 2012-12-15 DIAGNOSIS — B370 Candidal stomatitis: Secondary | ICD-10-CM | POA: Diagnosis present

## 2012-12-15 DIAGNOSIS — D696 Thrombocytopenia, unspecified: Secondary | ICD-10-CM

## 2012-12-15 DIAGNOSIS — Z96649 Presence of unspecified artificial hip joint: Secondary | ICD-10-CM

## 2012-12-15 DIAGNOSIS — D509 Iron deficiency anemia, unspecified: Secondary | ICD-10-CM

## 2012-12-15 DIAGNOSIS — J45909 Unspecified asthma, uncomplicated: Secondary | ICD-10-CM | POA: Diagnosis present

## 2012-12-15 DIAGNOSIS — Z79899 Other long term (current) drug therapy: Secondary | ICD-10-CM

## 2012-12-15 DIAGNOSIS — M329 Systemic lupus erythematosus, unspecified: Secondary | ICD-10-CM | POA: Diagnosis present

## 2012-12-15 DIAGNOSIS — D500 Iron deficiency anemia secondary to blood loss (chronic): Secondary | ICD-10-CM | POA: Diagnosis present

## 2012-12-15 DIAGNOSIS — K219 Gastro-esophageal reflux disease without esophagitis: Secondary | ICD-10-CM | POA: Diagnosis present

## 2012-12-15 DIAGNOSIS — Z23 Encounter for immunization: Secondary | ICD-10-CM

## 2012-12-15 DIAGNOSIS — F17200 Nicotine dependence, unspecified, uncomplicated: Secondary | ICD-10-CM | POA: Diagnosis present

## 2012-12-15 LAB — CBC WITH DIFFERENTIAL/PLATELET
Basophils Absolute: 0 10*3/uL (ref 0.0–0.1)
HCT: 36.8 % (ref 36.0–46.0)
Hemoglobin: 12.2 g/dL (ref 12.0–15.0)
Lymphocytes Relative: 40 % (ref 12–46)
Lymphs Abs: 2 10*3/uL (ref 0.7–4.0)
MCV: 75.6 fL — ABNORMAL LOW (ref 78.0–100.0)
Monocytes Absolute: 0.5 10*3/uL (ref 0.1–1.0)
Neutro Abs: 2.4 10*3/uL (ref 1.7–7.7)
RBC: 4.87 MIL/uL (ref 3.87–5.11)
RDW: 16.1 % — ABNORMAL HIGH (ref 11.5–15.5)
WBC: 5 10*3/uL (ref 4.0–10.5)

## 2012-12-15 LAB — COMPREHENSIVE METABOLIC PANEL
ALT: 10 U/L (ref 0–35)
AST: 14 U/L (ref 0–37)
CO2: 28 mEq/L (ref 19–32)
Calcium: 9.4 mg/dL (ref 8.4–10.5)
Potassium: 3.7 mEq/L (ref 3.5–5.1)
Sodium: 137 mEq/L (ref 135–145)
Total Protein: 7 g/dL (ref 6.0–8.3)

## 2012-12-15 LAB — URINE MICROSCOPIC-ADD ON

## 2012-12-15 LAB — URINALYSIS, ROUTINE W REFLEX MICROSCOPIC
Bilirubin Urine: NEGATIVE
Specific Gravity, Urine: 1.022 (ref 1.005–1.030)
pH: 6 (ref 5.0–8.0)

## 2012-12-15 MED ORDER — LEVALBUTEROL HCL 1.25 MG/0.5ML IN NEBU
1.2500 mg | INHALATION_SOLUTION | Freq: Three times a day (TID) | RESPIRATORY_TRACT | Status: DC
  Administered 2012-12-16 – 2012-12-17 (×4): 1.25 mg via RESPIRATORY_TRACT
  Filled 2012-12-15 (×8): qty 0.5

## 2012-12-15 MED ORDER — HYDROXYCHLOROQUINE SULFATE 200 MG PO TABS: 600.0000 mg | ORAL_TABLET | Freq: Every day | ORAL | Status: DC

## 2012-12-15 MED ORDER — METHYLPREDNISOLONE SODIUM SUCC 125 MG IJ SOLR: 250.0000 mg | Freq: Every day | INTRAMUSCULAR | Status: DC

## 2012-12-15 NOTE — H&P (Addendum)
[facility] History and Physical  Dana Kennedy AVW:[id number] DOB: 1965-09-21 DOA: 12/15/2012  Referring physician Carleene Cooper III, MD  PCP: Dorrene German, MD   Chief Complaint:  Myalgias and brusing   HPI:  47 yr old with recurrent thrombocytopenia with history of ITP. She was  on prednisone since 10-16-12, this at 1 mg/kg/day (80 mg/day) until 10-24-12 and on 60 mg/day  And has been off steroids 4 weeks     Patient presented in Dec 2011 with bleeding (vaginal, epistaxis, gums), with hemoglobin 5 and platelets <5K. This was determined to be blood loss anemia as well as AIHA, and ITP. She was diagnosed with lupus during this evaluation. She was treated with steroids and IVIG initially, then eventually resolved the thrombocytopenia with Rituxan. 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   She had acute sinusitis beginning in Dec 2013, treated with at least 3 different full rounds of antibiotics. At regularly scheduled visit at this office 1218-13 platelet count was 78k with Hgb 11.8. She continued with acute sinusitis symptoms at follow up visit 09-30-12, with platelets 58k and Hgb 11.   Steroids were begun 10-16-12 with Hgb 10.7 and plt 33k; she did not seem to be hemolyzing then with normal LDH and bilirubin. By 10-24-12 platelets were 199k and Hgb 10.9. She has had no bleeding or bruising.    She missed appt. on 12-03-12 due to inclement weather, and has not been followed by hem/onc in more than a month .She called PCP and has an appt on 4/22 . She has an appt with Dr Darrold Span on 4/8. She denies any hematemesis, melena, vaginal bleeding ,but does complain of easy bleeding and bruising , and hematuria .She has had bruising in her mouth and has had bleeding gums .         Review of Systems: negative for the following  Constitutional: Denies fever, chills, diaphoresis, appetite change  and fatigue.  HEENT: Denies photophobia, eye pain, redness, hearing loss, ear pain, congestion, sore throat, rhinorrhea, sneezing, mouth sores, trouble swallowing, neck pain, neck stiffness and tinnitus.  Respiratory: Denies SOB, DOE, cough, chest tightness, and wheezing.  Cardiovascular: Denies chest pain, palpitations and leg swelling.  Gastrointestinal: Denies nausea, vomiting, abdominal pain, diarrhea, constipation, blood in stool and abdominal distention.  Genitourinary: Denies dysuria, urgency, frequency, hematuria, flank pain and difficulty urinating.  Musculoskeletal: Denies myalgias, back pain, joint swelling, arthralgias and gait problem.  Skin: Denies pallor, rash and wound.  Neurological: Denies dizziness, seizures, syncope, weakness, light-headedness, numbness and headaches.  Hematological: Denies adenopathy.positive for  Easy bruising, personal or family bleeding history  Psychiatric/Behavioral: Denies suicidal ideation, mood changes, confusion, nervousness, sleep disturbance and agitation       Past Medical History  Diagnosis Date  . Lupus   . Asthma   . GERD (gastroesophageal reflux disease)   . Arthritis   . Anemia      Past Surgical History  Procedure Laterality Date  . Stomach ulcer surgery  2008  . Total hip arthroplasty  06/14/2012    Procedure: TOTAL HIP ARTHROPLASTY ANTERIOR APPROACH;  Surgeon: Kathryne Hitch, MD;  Location: [facility];  Service: Orthopedics;  Laterality: Left;  Left Total Hip Arthroplasty, Anterior Approach C-Arm      Social History:  reports that she quit smoking about 6 months ago. Her smoking use included Cigarettes. She has a 7.5 pack-year smoking history. She has never used  smokeless tobacco. She reports that  drinks alcohol. She reports that she does not use illicit drugs.    Allergies  Allergen Reactions  . Eszopiclone Swelling    Felt like throat was closing. Not sure if it was related to this medication or not. lunesta  .  Restoril (Temazepam)     Headaches     Family History  Problem Relation Age of Onset  . Cancer Mother   . Hypertension Mother      Prior to Admission medications   Medication Sig Start Date End Date Taking? Authorizing Provider  albuterol (PROVENTIL HFA;VENTOLIN HFA) 108 (90 BASE) MCG/ACT inhaler Inhale 2 puffs into the lungs every 6 (six) hours as needed. For shortness of breath   Yes Historical Provider, MD  amoxicillin (AMOXIL) 500 MG tablet Take 1 tablet (500 mg total) by mouth 3 (three) times daily. 11/05/12  Yes Lennis Buzzy Han, MD  bismuth subsalicylate (PEPTO BISMOL) 262 MG chewable tablet Chew 524 mg by mouth as needed. For upset stomach   Yes Historical Provider, MD  cetirizine (ZYRTEC) 10 MG tablet Take 1 tablet (10 mg total) by mouth daily. 11/05/12  Yes Lennis Buzzy Han, MD  ferrous sulfate 325 (65 FE) MG tablet Take 325 mg by mouth daily with breakfast.   Yes Historical Provider, MD  folic acid (FOLVITE) 1 MG tablet Take 1 tablet (1 mg total) by mouth daily. 10/16/12  Yes Lennis Buzzy Han, MD  HYDROcodone-homatropine (HYCODAN) 5-1.5 MG/5ML syrup Take 5 mLs by mouth every 6 (six) hours as needed for cough. 11/05/12  Yes Lennis Buzzy Han, MD  hydroxychloroquine (PLAQUENIL) 200 MG tablet Take 600 mg by mouth daily. 10/23/12  Yes Donnal Moat, MD  meloxicam (MOBIC) 15 MG tablet Take 15 mg by mouth daily.   Yes Historical Provider, MD  methocarbamol (ROBAXIN) 500 MG tablet Take 500 mg by mouth 4 (four) times daily.   Yes Historical Provider, MD  nicotine (NICODERM CQ - DOSED IN MG/24 HOURS) 14 mg/24hr patch Place 1 patch onto the skin daily. 10/16/12  Yes Lennis Buzzy Han, MD  oxyCODONE-acetaminophen (PERCOCET) 5-325 MG per tablet Take 1 tablet by mouth every 8 (eight) hours as needed for pain. 10/29/12  Yes Dorrene German, MD  pantoprazole (PROTONIX) 40 MG tablet Take 40 mg by mouth every morning.    Yes Historical Provider, MD  predniSONE (DELTASONE) 20 MG tablet Take 20 mg by  mouth daily.   Yes Historical Provider, MD     Physical Exam: Filed Vitals:   12/15/12 1759 12/15/12 2226  BP: 131/85 131/82  Pulse: 97 77  Temp: 99.3 F (37.4 C) 97.8 F (36.6 C)  TempSrc: Oral Oral  Resp: 20   SpO2: 97% 97%     Constitutional: Vital signs reviewed. Patient is a well-developed and well-nourished in no acute distress and cooperative with exam. Alert and oriented x3.  Head: Normocephalic and atraumatic  Ear: TM normal bilaterally  Mouth: no erythema or exudates, MMM  Eyes: PERRL, EOMI, conjunctivae normal, No scleral icterus.  Neck: Supple, Trachea midline normal ROM, No JVD, mass, thyromegaly, or carotid bruit present.  Cardiovascular: RRR, S1 normal, S2 normal, no MRG, pulses symmetric and intact bilaterally  Pulmonary/Chest: CTAB, no wheezes, rales, or rhonchi  Abdominal: Soft. Non-tender, non-distended, bowel sounds are normal, no masses, organomegaly, or guarding present.  GU: no CVA tenderness Musculoskeletal: No joint deformities, erythema, or stiffness, ROM full and no nontender Ext: no edema and no cyanosis, pulses palpable bilaterally (DP and  PT)  Hematology: no cervical, inginal, or axillary adenopathy.  Neurological: A&O x3, Strenght is normal and symmetric bilaterally, cranial nerve II-XII are grossly intact, no focal motor deficit, sensory intact to light touch bilaterally.  Skin: Warm, dry and intact. No rash, cyanosis, or clubbing.  Psychiatric: Normal mood and affect. speech and behavior is normal. Judgment and thought content normal. Cognition and memory are normal.       Labs on Admission:    Basic Metabolic Panel:  Recent Labs Lab 12/15/12 2113  NA 137  K 3.7  CL 99  CO2 28  GLUCOSE 90  BUN 16  CREATININE 0.76  CALCIUM 9.4   Liver Function Tests:  Recent Labs Lab 12/15/12 2113  AST 14  ALT 10  ALKPHOS 70  BILITOT 0.2*  PROT 7.0  ALBUMIN 3.7   No results found for this basename: LIPASE, AMYLASE,  in the last 168  hours No results found for this basename: AMMONIA,  in the last 168 hours CBC:  Recent Labs Lab 12/15/12 2113  WBC 5.0  NEUTROABS 2.4  HGB 12.2  HCT 36.8  MCV 75.6*  PLT <5*   Cardiac Enzymes: No results found for this basename: CKTOTAL, CKMB, CKMBINDEX, TROPONINI,  in the last 168 hours  BNP (last 3 results) No results found for this basename: PROBNP,  in the last 8760 hours    CBG: No results found for this basename: GLUCAP,  in the last 168 hours  Radiological Exams on Admission: Dg Chest 2 View  12/15/2012  *RADIOLOGY REPORT*  Clinical Data: Systemic lupus erythematosus flare.  Bruising with mouth sores, chest pain, shortness of breath and possible fever.  CHEST - 2 VIEW  Comparison: Radiographs 06/12/2012.  Findings: The heart size and mediastinal contours are stable. There is mild aortic tortuosity.  The lungs are clear.  There is no pleural effusion or pneumothorax.  IMPRESSION: Stable chest.  No acute cardiopulmonary process.   Original Report Authenticated By: Carey Bullocks, M.D.     EKG: Independently reviewed.    Assessment/Plan Principal Problem:   ITP (idiopathic thrombocytopenic purpura) Active Problems:   Lupus (systemic lupus erythematosus)   1. ITP will consult hem/onc tonight , order LDH  And haptoglobin, start high dose solumedrol and IVIG , she may need rituxan , splenectomy ? Will defer further recommendations to hem/onc,use PPI for prophylaxsis, Hem/onc paged 3 times already, waiting for a call back  2. Lupus, check ESR ,hold palquinel   As this can also casue thrombocytopenia  Addendum: discussed with pharmacy to dose prednisone to solumedrol , and order it   Code Status:   full Family Communication: bedside Disposition Plan: admit   Time spent: 70 mins   [facility] Pager [phone]  If 7PM-7AM, please contact night-coverage [email or web address] Password [facility] 12/15/2012, 11:41 PM

## 2012-12-15 NOTE — ED Notes (Signed)
Pt reports lupus flare up onset Tuesday. Pt symptoms include Bruising to B/L arms that started Tuesday then progressing to Sores in mouth and pain in legs onset yesterday. Pt reports symptoms same as her usual Lupus flare ups.

## 2012-12-15 NOTE — ED Provider Notes (Signed)
History     CSN: [id number]  Arrival date & time 12/15/12  1748   First MD Initiated Contact with Patient 12/15/12 1948      Chief Complaint  Patient presents with  . Lupus    (Consider location/radiation/quality/duration/timing/severity/associated sxs/prior treatment) HPI Comments:  Patient is a 47 year old woman with known lupus.in 2011 she had easy bruising and bleeding, was found to have a platelet count of about 5000. At that time she was treated with steroids, high the Ig, and right proximal and.Rituxan, with resolution of her symptoms. She has been out of her medications, which include steroids, for several months, probably for economic reasons. Now for the past week she has noted easy bruising and dark red urine. She therefore seeks evaluation.  Patient is a 47 y.o. female presenting with general illness. The history is provided by the patient and medical records. No language interpreter was used.  Illness  The current episode started 5 to 7 days ago. Episode frequency: Has noted frequent bruises and dark red urine for about a week. The problem has been gradually worsening. The problem is moderate. Nothing relieves the symptoms. Nothing aggravates the symptoms. Pertinent negatives include no fever and no abdominal pain. Rash: Easy bruising. She has been eating and drinking normally. She has received no recent medical care.    Past Medical History  Diagnosis Date  . Lupus   . Asthma   . GERD (gastroesophageal reflux disease)   . Arthritis   . Anemia     Past Surgical History  Procedure Laterality Date  . Stomach ulcer surgery  2008  . Total hip arthroplasty  06/14/2012    Procedure: TOTAL HIP ARTHROPLASTY ANTERIOR APPROACH;  Surgeon: Kathryne Hitch, MD;  Location: [facility];  Service: Orthopedics;  Laterality: Left;  Left Total Hip Arthroplasty, Anterior Approach C-Arm    Family History  Problem Relation Age of Onset  . Cancer Mother   . Hypertension Mother      History  Substance Use Topics  . Smoking status: Former Smoker -- 0.50 packs/day for 15 years    Types: Cigarettes    Quit date: 06/15/2012  . Smokeless tobacco: Never Used  . Alcohol Use: Yes     Comment: Quit 09/11/2012    OB History   Grav Para Term Preterm Abortions TAB SAB Ect Mult Living                  Review of Systems  Constitutional: Negative for fever and chills.  HENT: Negative.   Eyes: Negative.   Respiratory: Negative.   Cardiovascular: Negative.   Gastrointestinal: Negative.  Negative for abdominal pain.  Genitourinary: Positive for hematuria.  Skin: Rash: Easy bruising.  Neurological: Negative.   Hematological: Bruises/bleeds easily.  Psychiatric/Behavioral: Negative.     Allergies  Eszopiclone and Restoril  Home Medications   Current Outpatient Rx  Name  Route  Sig  Dispense  Refill  . albuterol (PROVENTIL HFA;VENTOLIN HFA) 108 (90 BASE) MCG/ACT inhaler   Inhalation   Inhale 2 puffs into the lungs every 6 (six) hours as needed. For shortness of breath         . amoxicillin (AMOXIL) 500 MG tablet   Oral   Take 1 tablet (500 mg total) by mouth 3 (three) times daily.   21 tablet   0   . bismuth subsalicylate (PEPTO BISMOL) 262 MG chewable tablet   Oral   Chew 524 mg by mouth as needed. For upset stomach         .  cetirizine (ZYRTEC) 10 MG tablet   Oral   Take 1 tablet (10 mg total) by mouth daily.   30 tablet   5   . ferrous sulfate 325 (65 FE) MG tablet   Oral   Take 325 mg by mouth daily with breakfast.         . folic acid (FOLVITE) 1 MG tablet   Oral   Take 1 tablet (1 mg total) by mouth daily.   30 tablet   2   . HYDROcodone-homatropine (HYCODAN) 5-1.5 MG/5ML syrup   Oral   Take 5 mLs by mouth every 6 (six) hours as needed for cough.   100 mL   0   . hydroxychloroquine (PLAQUENIL) 200 MG tablet   Oral   Take 600 mg by mouth daily.         . meloxicam (MOBIC) 15 MG tablet   Oral   Take 15 mg by mouth  daily.         . methocarbamol (ROBAXIN) 500 MG tablet   Oral   Take 500 mg by mouth 4 (four) times daily.         . nicotine (NICODERM CQ - DOSED IN MG/24 HOURS) 14 mg/24hr patch   Transdermal   Place 1 patch onto the skin daily.   30 patch   0   . oxyCODONE-acetaminophen (PERCOCET) 5-325 MG per tablet   Oral   Take 1 tablet by mouth every 8 (eight) hours as needed for pain.         . pantoprazole (PROTONIX) 40 MG tablet   Oral   Take 40 mg by mouth every morning.          . predniSONE (DELTASONE) 20 MG tablet   Oral   Take 20 mg by mouth daily.           BP 131/82  Pulse 77  Temp(Src) 97.8 F (36.6 C) (Oral)  Resp 20  SpO2 97%  LMP 10/24/2012  Physical Exam  Nursing note and vitals reviewed. Constitutional: She is oriented to person, place, and time. She appears well-developed and well-nourished. No distress.  HENT:  Head: Normocephalic and atraumatic.  Right Ear: External ear normal.  Left Ear: External ear normal.  Mouth/Throat: Oropharynx is clear and moist.  Eyes: Conjunctivae and EOM are normal. Pupils are equal, round, and reactive to light. No scleral icterus.  Neck: Normal range of motion. Neck supple.  Cardiovascular: Normal rate, regular rhythm and normal heart sounds.   Pulmonary/Chest: Effort normal and breath sounds normal.  Abdominal: Soft. Bowel sounds are normal.  Musculoskeletal: Normal range of motion. She exhibits no edema and no tenderness.  Neurological: She is oriented to person, place, and time.  No sensory or motor deficits.  Skin:  Bruises on the right upper arm, left forearm.  Psychiatric: She has a normal mood and affect. Her behavior is normal.    ED Course  Procedures (including critical care time)  Labs Reviewed  CBC WITH DIFFERENTIAL - Abnormal; Notable for the following:    MCV 75.6 (*)    MCH 25.1 (*)    RDW 16.1 (*)    Platelets <5 (*)    All other components within normal limits  COMPREHENSIVE METABOLIC  PANEL - Abnormal; Notable for the following:    Total Bilirubin 0.2 (*)    All other components within normal limits  URINALYSIS, ROUTINE W REFLEX MICROSCOPIC - Abnormal; Notable for the following:    APPearance CLOUDY (*)  Hgb urine dipstick LARGE (*)    Leukocytes, UA MODERATE (*)    All other components within normal limits  URINE MICROSCOPIC-ADD ON - Abnormal; Notable for the following:    Squamous Epithelial / LPF FEW (*)    Bacteria, UA FEW (*)    All other components within normal limits  URINE CULTURE   Dg Chest 2 View  12/15/2012  *RADIOLOGY REPORT*  Clinical Data: Systemic lupus erythematosus flare.  Bruising with mouth sores, chest pain, shortness of breath and possible fever.  CHEST - 2 VIEW  Comparison: Radiographs 06/12/2012.  Findings: The heart size and mediastinal contours are stable. There is mild aortic tortuosity.  The lungs are clear.  There is no pleural effusion or pneumothorax.  IMPRESSION: Stable chest.  No acute cardiopulmonary process.   Original Report Authenticated By: Carey Bullocks, M.D.     11:25 PM Lab tests showed platelet count of less than 5000.  She has hematuria.  Call to Dr. Susie Cassette to admit her, Rx with IV Solumedrol, Type and screen in case transfusion should be needed.   1. Thrombocytopenia   2. Lupus            Carleene Cooper III, MD 12/16/12 [phone]

## 2012-12-16 ENCOUNTER — Encounter (HOSPITAL_COMMUNITY): Payer: Self-pay | Admitting: *Deleted

## 2012-12-16 DIAGNOSIS — M329 Systemic lupus erythematosus, unspecified: Secondary | ICD-10-CM

## 2012-12-16 DIAGNOSIS — D693 Immune thrombocytopenic purpura: Principal | ICD-10-CM

## 2012-12-16 DIAGNOSIS — D509 Iron deficiency anemia, unspecified: Secondary | ICD-10-CM

## 2012-12-16 LAB — CBC
HCT: 35.1 % — ABNORMAL LOW (ref 36.0–46.0)
HCT: 36.8 % (ref 36.0–46.0)
Hemoglobin: 11.7 g/dL — ABNORMAL LOW (ref 12.0–15.0)
Hemoglobin: 12.5 g/dL (ref 12.0–15.0)
MCHC: 33.3 g/dL (ref 30.0–36.0)
MCV: 75.5 fL — ABNORMAL LOW (ref 78.0–100.0)
RBC: 4.9 MIL/uL (ref 3.87–5.11)
RDW: 16.1 % — ABNORMAL HIGH (ref 11.5–15.5)
WBC: 4.8 10*3/uL (ref 4.0–10.5)
WBC: 3.2 10*3/uL — ABNORMAL LOW (ref 4.0–10.5)

## 2012-12-16 LAB — TYPE AND SCREEN
ABO/RH(D): A POS
Antibody Screen: NEGATIVE

## 2012-12-16 LAB — BASIC METABOLIC PANEL
BUN: 13 mg/dL (ref 6–23)
Chloride: 99 mEq/L (ref 96–112)
Creatinine, Ser: 0.74 mg/dL (ref 0.50–1.10)
GFR calc Af Amer: >90 mL/min (ref >90)
Glucose, Bld: 158 mg/dL — ABNORMAL HIGH (ref 70–99)

## 2012-12-16 LAB — LACTATE DEHYDROGENASE: LDH: 219 U/L (ref 94–250)

## 2012-12-16 LAB — HAPTOGLOBIN: Haptoglobin: 201 mg/dL — ABNORMAL HIGH (ref 45–215)

## 2012-12-16 MED ORDER — PANTOPRAZOLE SODIUM 40 MG PO TBEC
40.0000 mg | DELAYED_RELEASE_TABLET | Freq: Every morning | ORAL | Status: DC
  Administered 2012-12-16 – 2012-12-18 (×3): 40 mg via ORAL
  Filled 2012-12-16 (×3): qty 1

## 2012-12-16 MED ORDER — SODIUM CHLORIDE 0.9 % IJ SOLN
3.0000 mL | Freq: Two times a day (BID) | INTRAMUSCULAR | Status: DC
  Administered 2012-12-16 – 2012-12-17 (×3): 3 mL via INTRAVENOUS

## 2012-12-16 MED ORDER — OXYCODONE-ACETAMINOPHEN 5-325 MG PO TABS
2.0000 | ORAL_TABLET | Freq: Four times a day (QID) | ORAL | Status: DC | PRN
  Administered 2012-12-16 – 2012-12-18 (×7): 2 via ORAL
  Filled 2012-12-16 (×7): qty 2

## 2012-12-16 MED ORDER — ALBUTEROL SULFATE HFA 108 (90 BASE) MCG/ACT IN AERS
2.0000 | INHALATION_SPRAY | Freq: Four times a day (QID) | RESPIRATORY_TRACT | Status: DC | PRN
  Filled 2012-12-16: qty 6.7

## 2012-12-16 MED ORDER — HYDROCODONE-HOMATROPINE 5-1.5 MG/5ML PO SYRP: 5.0000 mL | ORAL_SOLUTION | Freq: Four times a day (QID) | ORAL | Status: DC | PRN

## 2012-12-16 MED ORDER — METHYLPREDNISOLONE SODIUM SUCC 125 MG IJ SOLR
60.0000 mg | Freq: Every day | INTRAMUSCULAR | Status: DC
  Filled 2012-12-16: qty 0.96

## 2012-12-16 MED ORDER — LORATADINE 10 MG PO TABS
10.0000 mg | ORAL_TABLET | Freq: Every day | ORAL | Status: DC
  Administered 2012-12-16 – 2012-12-18 (×3): 10 mg via ORAL
  Filled 2012-12-16 (×3): qty 1

## 2012-12-16 MED ORDER — ACETAMINOPHEN 325 MG PO TABS
650.0000 mg | ORAL_TABLET | ORAL | Status: DC
  Administered 2012-12-16: 650 mg via ORAL
  Filled 2012-12-16: qty 2

## 2012-12-16 MED ORDER — NICOTINE 14 MG/24HR TD PT24: 14.0000 mg | MEDICATED_PATCH | Freq: Every day | TRANSDERMAL | Status: DC

## 2012-12-16 MED ORDER — INFLUENZA VIRUS VACC SPLIT PF IM SUSP
0.5000 mL | INTRAMUSCULAR | Status: AC
  Administered 2012-12-17: 0.5 mL via INTRAMUSCULAR
  Filled 2012-12-16: qty 0.5

## 2012-12-16 MED ORDER — IMMUNE GLOBULIN (HUMAN) 20 GM/200ML IV SOLN
1.0000 g/kg | INTRAVENOUS | Status: DC
  Filled 2012-12-16: qty 800

## 2012-12-16 MED ORDER — LEVALBUTEROL HCL 0.63 MG/3ML IN NEBU
0.6300 mg | INHALATION_SOLUTION | Freq: Four times a day (QID) | RESPIRATORY_TRACT | Status: DC | PRN
  Filled 2012-12-16: qty 3

## 2012-12-16 MED ORDER — BIOTENE DRY MOUTH MT LIQD
15.0000 mL | Freq: Four times a day (QID) | OROMUCOSAL | Status: DC
Start: 2012-12-16 — End: 2012-12-18
  Administered 2012-12-16 – 2012-12-18 (×8): 15 mL via OROMUCOSAL

## 2012-12-16 MED ORDER — SODIUM CHLORIDE 0.9 % IJ SOLN: 3.0000 mL | INTRAMUSCULAR | Status: DC | PRN

## 2012-12-16 MED ORDER — FERROUS GLUCONATE 324 (38 FE) MG PO TABS
324.0000 mg | ORAL_TABLET | Freq: Two times a day (BID) | ORAL | Status: DC
  Administered 2012-12-16 (×2): 324 mg via ORAL
  Filled 2012-12-16 (×4): qty 1

## 2012-12-16 MED ORDER — DIPHENHYDRAMINE HCL 50 MG PO CAPS
50.0000 mg | ORAL_CAPSULE | ORAL | Status: DC
  Administered 2012-12-16: 50 mg via ORAL
  Filled 2012-12-16: qty 1

## 2012-12-16 MED ORDER — SODIUM CHLORIDE 0.9 % IJ SOLN
3.0000 mL | Freq: Two times a day (BID) | INTRAMUSCULAR | Status: DC
  Administered 2012-12-16: 3 mL via INTRAVENOUS

## 2012-12-16 MED ORDER — ACETAMINOPHEN 325 MG PO TABS
650.0000 mg | ORAL_TABLET | Freq: Four times a day (QID) | ORAL | Status: DC | PRN
  Administered 2012-12-17: 650 mg via ORAL
  Filled 2012-12-16: qty 2

## 2012-12-16 MED ORDER — SODIUM CHLORIDE 0.9 % IV SOLN: 250.0000 mL | INTRAVENOUS | Status: DC | PRN

## 2012-12-16 MED ORDER — METHYLPREDNISOLONE SODIUM SUCC 125 MG IJ SOLR
125.0000 mg | Freq: Once | INTRAMUSCULAR | Status: AC
  Administered 2012-12-16: 125 mg via INTRAVENOUS
  Filled 2012-12-16: qty 2

## 2012-12-16 MED ORDER — FOLIC ACID 1 MG PO TABS
1.0000 mg | ORAL_TABLET | Freq: Every day | ORAL | Status: DC
  Administered 2012-12-16 – 2012-12-18 (×3): 1 mg via ORAL
  Filled 2012-12-16 (×3): qty 1

## 2012-12-16 MED ORDER — METHOCARBAMOL 500 MG PO TABS
500.0000 mg | ORAL_TABLET | Freq: Four times a day (QID) | ORAL | Status: DC
  Administered 2012-12-16 – 2012-12-18 (×9): 500 mg via ORAL
  Filled 2012-12-16 (×14): qty 1

## 2012-12-16 MED ORDER — AMINOCAPROIC ACID SOLUTION 5% (50 MG/ML)
5.0000 mL | Freq: Four times a day (QID) | ORAL | Status: DC
  Administered 2012-12-16 – 2012-12-18 (×7): 5 mL via ORAL
  Filled 2012-12-16: qty 100

## 2012-12-16 MED ORDER — PREDNISONE 50 MG PO TABS
60.0000 mg | ORAL_TABLET | Freq: Every day | ORAL | Status: DC
  Administered 2012-12-16 – 2012-12-18 (×3): 60 mg via ORAL
  Filled 2012-12-16 (×4): qty 1

## 2012-12-16 MED ORDER — ACETAMINOPHEN 650 MG RE SUPP: 650.0000 mg | Freq: Four times a day (QID) | RECTAL | Status: DC | PRN

## 2012-12-16 MED ORDER — NICOTINE 14 MG/24HR TD PT24
14.0000 mg | MEDICATED_PATCH | TRANSDERMAL | Status: DC
  Administered 2012-12-16 – 2012-12-17 (×2): 14 mg via TRANSDERMAL
  Filled 2012-12-16 (×4): qty 1

## 2012-12-16 MED ORDER — FLUCONAZOLE 100 MG PO TABS
100.0000 mg | ORAL_TABLET | Freq: Every day | ORAL | Status: DC
  Administered 2012-12-16 – 2012-12-18 (×3): 100 mg via ORAL
  Filled 2012-12-16 (×3): qty 1

## 2012-12-16 MED ORDER — SODIUM CHLORIDE 0.9 % IV SOLN
INTRAVENOUS | Status: AC
  Administered 2012-12-16: 02:00:00 via INTRAVENOUS

## 2012-12-16 MED ORDER — IMMUNE GLOBULIN (HUMAN) 10 GM/200ML IV SOLN
1.0000 g/kg | INTRAVENOUS | Status: DC
  Administered 2012-12-16: 80 g via INTRAVENOUS
  Filled 2012-12-16 (×2): qty 1600

## 2012-12-16 MED ORDER — PREDNISONE 20 MG PO TABS
20.0000 mg | ORAL_TABLET | Freq: Every day | ORAL | Status: DC
  Administered 2012-12-16 – 2012-12-18 (×3): 20 mg via ORAL
  Filled 2012-12-16 (×3): qty 1

## 2012-12-16 NOTE — Consult Note (Signed)
Medical Oncology 12-16-2012   Reason for Consult: thrombocytopenia Referring Physician: hospitalist  DEATRA MCMAHEN is a 47 y.o. 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 She was treated then with steroids/ IVIG and eventually resolved with Rituxan. 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 She was off treatment until very prolonged sinusitis problems Dec - Jan and with this had drop in platelets consistent with ITP flare. Platelet count improved rapidly with prednisone in late Jan, which was tapered down and off by 11-13-12. I saw her last in office 11-05-12 after which she missed at least 2 appointments with me and we were unable to reach her by phone. Last CBC at my office was 11-12-12 with WBC 5.0, Hgb 11.6 and plt 276. She had iron deficiency documented in Feb. Patient presented to ED last pm with complaints of 5-7 days of unexplained bruising and some oral bleeding. CBC had WBC 5.0, Hgb 12.2, plt <5. At request of Dr Susie Cassette, as there was some difficulty reaching on call MD, [facility] answering service called me at home at midnight (I was not on call) at which time I returned Dr Antionette Poles call and discussed with her by phone. Unfortunately, despite this conversation, patient has received no steroids as of the time of my visit this am (confirmed with pharmacy, the IV solumedrol order placed to begin this AM instead of on admission). She was not febrile, did not appear infected, PT INR ok, renal function good.  ROS: no noted bleeding since admission last pm, tho multiple bruises on extremities, SQ bleeds in mouth and dark urine. No HA or other neurologic symptoms. Has had sinus congestion just today, this having been much better in past few weeks. Denies lower respiratory symptoms. Appetite good, bowels ok without bleeding. No dysuria. No fever PTA. Knees painful as they have been,  hip replacement great, new pain left elbow just today without swelling or trauma known.   Allergies:  Allergies  Allergen Reactions  . Eszopiclone Swelling    Felt like throat was closing. Not sure if it was related to this medication or not. lunesta  . Restoril (Temazepam)     Headaches    Past Medical History  Diagnosis Date  . Lupus   . Asthma   . GERD (gastroesophageal reflux disease)   . Arthritis   . Anemia    Patient saw Dr Narda Bonds ENT since she was at my office last, tells me that he recommended tonsillectomy but that she is afraid of complications and has not decided to do that Past Surgical History  Procedure Laterality Date  . Stomach ulcer surgery  2008  . Total hip arthroplasty  06/14/2012    Procedure: TOTAL HIP ARTHROPLASTY ANTERIOR APPROACH;  Surgeon: Kathryne Hitch, MD;  Location: [facility];  Service: Orthopedics;  Laterality: Left;  Left Total Hip Arthroplasty, Anterior Approach C-Arm    Family History  Problem Relation Age of Onset  . Cancer Mother   . Hypertension Mother     Social History:  reports that she has been smoking Cigarettes.  She has a 14.5 pack-year smoking history. She has never used smokeless tobacco. She reports that  drinks alcohol. She reports that she does not use illicit drugs. She had previously stopped smoking after Dec 2011.  Medications: reviewed in EMR and confirmed with [facility] pharmacist now that she has NOT had any  steroids since presenting to ED yesterday.  Blood pressure 128/96, pulse 86, temperature 97.9 F (36.6 C), temperature source Oral, resp. rate 16, height 5\' 5"  (1.651 m), weight 175 lb 14.8 oz (79.8 kg), last menstrual period 10/24/2012, SpO2 100.00%.  Awake, alert, NAD seated in bed on RA. No obvious bleeding. HEENT: PERRL, not clearly icteric. EOMI. Oral mucosa moist with SQ bleed 4 mm lower lip and bleed around ulcerated area left inferior tongue. No thrush. Tonsillar tissue prominent bilaterally with some SQ  blood trace on left, no exudate and not as inflamed as previously. Lymphatics: no cervical, supraclavicular, axillary adenopathy. Lungs clear to A&P anteriorly and posteriorly. Heart RRR no gallop. Abdomen soft, normal bowel sounds, not tender, no appreciable HSM.LE no edema, cords, tenderness. Can extend right elbow completely, no swelling, heat or erythema. Skin with ecchymoses scattered upper arms and thighs up to 3-4 cm, and scattered petechiae on lower legs bilaterally. Neuro: nonfocal CN, motor, sensory, cerebellar on exam in bed.   Results for orders placed during the hospital encounter of 12/15/12 (from the past 48 hour(s))  CBC WITH DIFFERENTIAL     Status: Abnormal   Collection Time    12/15/12  9:13 PM      Result Value Range   WBC 5.0  4.0 - 10.5 K/uL   RBC 4.87  3.87 - 5.11 MIL/uL   Hemoglobin 12.2  12.0 - 15.0 g/dL   HCT 40.9  81.1 - 91.4 %   MCV 75.6 (*) 78.0 - 100.0 fL   MCH 25.1 (*) 26.0 - 34.0 pg   MCHC 33.2  30.0 - 36.0 g/dL   RDW 78.2 (*) 95.6 - 21.3 %   Platelets <5 (*) 150 - 400 K/uL   Comment: CRITICAL RESULT CALLED TO, READ BACK BY AND VERIFIED WITH:     VANCE,M RN 12/15/2012 2216 JORDANS     SPECIMEN CHECKED FOR CLOTS     PLATELET COUNT CONFIRMED BY SMEAR   Neutrophils Relative 47  43 - 77 %   Neutro Abs 2.4  1.7 - 7.7 K/uL   Lymphocytes Relative 40  12 - 46 %   Lymphs Abs 2.0  0.7 - 4.0 K/uL   Monocytes Relative 10  3 - 12 %   Monocytes Absolute 0.5  0.1 - 1.0 K/uL   Eosinophils Relative 3  0 - 5 %   Eosinophils Absolute 0.1  0.0 - 0.7 K/uL   Basophils Relative 1  0 - 1 %   Basophils Absolute 0.0  0.0 - 0.1 K/uL   Smear Review PLATELETS APPEAR DECREASED    COMPREHENSIVE METABOLIC PANEL     Status: Abnormal   Collection Time    12/15/12  9:13 PM      Result Value Range   Sodium 137  135 - 145 mEq/L   Potassium 3.7  3.5 - 5.1 mEq/L   Chloride 99  96 - 112 mEq/L   CO2 28  19 - 32 mEq/L   Glucose, Bld 90  70 - 99 mg/dL   BUN 16  6 - 23 mg/dL    Creatinine, Ser 0.86  0.50 - 1.10 mg/dL   Calcium 9.4  8.4 - 57.8 mg/dL   Total Protein 7.0  6.0 - 8.3 g/dL   Albumin 3.7  3.5 - 5.2 g/dL   AST 14  0 - 37 U/L   ALT 10  0 - 35 U/L   Alkaline Phosphatase 70  39 - 117 U/L   Total Bilirubin 0.2 (*)  0.3 - 1.2 mg/dL   GFR calc non Af Amer >90  >90 mL/min   GFR calc Af Amer >90  >90 mL/min   Comment:            The eGFR has been calculated     using the CKD EPI equation.     This calculation has not been     validated in all clinical     situations.     eGFR's persistently     <90 mL/min signify     possible Chronic Kidney Disease.  URINALYSIS, ROUTINE W REFLEX MICROSCOPIC     Status: Abnormal   Collection Time    12/15/12 10:30 PM      Result Value Range   Color, Urine YELLOW  YELLOW   APPearance CLOUDY (*) CLEAR   Specific Gravity, Urine 1.022  1.005 - 1.030   pH 6.0  5.0 - 8.0   Glucose, UA NEGATIVE  NEGATIVE mg/dL   Hgb urine dipstick LARGE (*) NEGATIVE   Bilirubin Urine NEGATIVE  NEGATIVE   Ketones, ur NEGATIVE  NEGATIVE mg/dL   Protein, ur NEGATIVE  NEGATIVE mg/dL   Urobilinogen, UA 0.2  0.0 - 1.0 mg/dL   Nitrite NEGATIVE  NEGATIVE   Leukocytes, UA MODERATE (*) NEGATIVE  URINE MICROSCOPIC-ADD ON     Status: Abnormal   Collection Time    12/15/12 10:30 PM      Result Value Range   Squamous Epithelial / LPF FEW (*) RARE   WBC, UA 3-6  <3 WBC/hpf   RBC / HPF 7-10  <3 RBC/hpf   Bacteria, UA FEW (*) RARE   Urine-Other MUCOUS PRESENT    LACTATE DEHYDROGENASE     Status: None   Collection Time    12/15/12 11:31 PM      Result Value Range   LDH 220  94 - 250 U/L  TYPE AND SCREEN     Status: None   Collection Time    12/15/12 11:45 PM      Result Value Range   ABO/RH(D) A POS     Antibody Screen NEG     Sample Expiration 12/18/2012    PROTIME-INR     Status: None   Collection Time    12/16/12  2:00 AM      Result Value Range   Prothrombin Time 12.7  11.6 - 15.2 seconds   INR 0.96  0.00 - 1.49  CBC     Status:  Abnormal   Collection Time    12/16/12  2:00 AM      Result Value Range   WBC 4.8  4.0 - 10.5 K/uL   RBC 4.65  3.87 - 5.11 MIL/uL   Hemoglobin 11.7 (*) 12.0 - 15.0 g/dL   HCT 96.0 (*) 45.4 - 09.8 %   MCV 75.5 (*) 78.0 - 100.0 fL   MCH 25.2 (*) 26.0 - 34.0 pg   MCHC 33.3  30.0 - 36.0 g/dL   RDW 11.9 (*) 14.7 - 82.9 %   Platelets <5 (*) 150 - 400 K/uL   Comment: SPECIMEN CHECKED FOR CLOTS     CRITICAL VALUE NOTED.  VALUE IS CONSISTENT WITH PREVIOUSLY REPORTED AND CALLED VALUE.  BASIC METABOLIC PANEL     Status: Abnormal   Collection Time    12/16/12  2:00 AM      Result Value Range   Sodium 136  135 - 145 mEq/L   Potassium 3.5  3.5 - 5.1 mEq/L   Chloride  99  96 - 112 mEq/L   CO2 26  19 - 32 mEq/L   Glucose, Bld 158 (*) 70 - 99 mg/dL   BUN 13  6 - 23 mg/dL   Creatinine, Ser 1.91  0.50 - 1.10 mg/dL   Calcium 9.3  8.4 - 47.8 mg/dL   GFR calc non Af Amer >90  >90 mL/min   GFR calc Af Amer >90  >90 mL/min   Comment:            The eGFR has been calculated     using the CKD EPI equation.     This calculation has not been     validated in all clinical     situations.     eGFR's persistently     <90 mL/min signify     possible Chronic Kidney Disease.    Peripheral blood smear from ED last pm reviewed by this MD now. A single platelet seen on multiple fields reviewed. RBCs microcytic with spherocytes, no fragmented cells. Some atypical lymphs (?) but no clear immature cells seen and I have asked pathologist to review also.  Dg Chest 2 View  12/15/2012  *RADIOLOGY REPORT*  Clinical Data: Systemic lupus erythematosus flare.  Bruising with mouth sores, chest pain, shortness of breath and possible fever.  CHEST - 2 VIEW  Comparison: Radiographs 06/12/2012.  Findings: The heart size and mediastinal contours are stable. There is mild aortic tortuosity.  The lungs are clear.  There is no pleural effusion or pneumothorax.  IMPRESSION: Stable chest.  No acute cardiopulmonary process.    Original Report Authenticated By: Carey Bullocks, M.D.         Assessment/Plan: 1. 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 I will add IVIG x 2 days. Bleeding precautions. Do not transfuse unless significant active bleeding. Daily CBC. She is not in DIC, not clinically septic, no neurologic or renal findings to suggest HUS/TTP. HIV negative 08-2010. 2.previous AIHA in 2011: Hgb dropping now with urine as above, tho other labs including Tbili, LDH, haptoglobin, DAT not showing much from this standpoint at least as yet. Steroids as above, continue folate 1 mg daily and follow 3.iron deficiency apparently from various blood loss previously: I would prefer not to give IV iron until other situation is more stable. I will resume her po iron 4.hx recurrent sinusitis and tonsillitis: known to Dr Narda Bonds 5. 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 Degenerative knee problems and new pain left elbow 7. Tobacco abuse: may need nicotine patch  Will follow with you. Please call between my rounds if needed. LIVESAY,LENNIS P 12/16/2012, 8:31 AM  [phone]

## 2012-12-16 NOTE — Progress Notes (Signed)
[facility] PROGRESS NOTE  CASH MEADOW ONG:[id number] DOB: [birth year] DOA: 12/15/2012 PCP: Dorrene German, MD  Brief Narrative: 47 year old woman with known lupus.in 2011 she had easy bruising and bleeding, was found to have a platelet count of about 5000. At that time she was treated with steroids, high the Ig, and right proximal and.Rituxan, with resolution of her symptoms. She has been out of her medications, which include steroids, for several months, probably for economic reasons. Now for the past week she has noted easy bruising and dark red urine.  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 Patient also has Lupus. Appreciate oncology recommendations. Start IVIG/steroids.  - no signs of bleeding  Code Status: Full Family Communication: none  Disposition Plan: pending hemeonc eval  Consultants:  Oncology  Procedures:  none  Antibiotics:  none  HPI/Subjective: - complains of lower lip "dark vesicle", otherwise no complaints. Denies any bleeding.   Objective: Filed Vitals:   12/15/12 2226 12/16/12 0018 12/16/12 0143 12/16/12 0511  BP: 131/82 117/77 126/76 128/96  Pulse: 77 72 94 86  Temp: 97.8 F (36.6 C)  99.1 F (37.3 C) 97.9 F (36.6 C)  TempSrc: Oral  Oral Oral  Resp:  18 20 16   Height:   5\' 5"  (1.651 m)   Weight:   79.8 kg (175 lb 14.8 oz)   SpO2: 97% 100% 96% 100%   No intake or output data in the 24 hours ending 12/16/12 0812 Filed Weights   12/16/12 0143  Weight: 79.8 kg (175 lb 14.8 oz)    Exam:   General:  NAD  Cardiovascular: regular rate and rhythm, without MRG  Respiratory: good air movement, clear to auscultation throughout, no wheezing, ronchi or rales  Abdomen: soft, not tender to palpation, positive bowel sounds  MSK: no peripheral edema  Neuro: CN 2-12 grossly intact, MS 5/5 in all 4  Data Reviewed: Basic Metabolic Panel:  Recent Labs Lab 12/15/12 2113 12/16/12 0200  NA 137 136  K 3.7 3.5  CL 99 99  CO2  28 26  GLUCOSE 90 158*  BUN 16 13  CREATININE 0.76 0.74  CALCIUM 9.4 9.3   Liver Function Tests:  Recent Labs Lab 12/15/12 2113  AST 14  ALT 10  ALKPHOS 70  BILITOT 0.2*  PROT 7.0  ALBUMIN 3.7   CBC:  Recent Labs Lab 12/15/12 2113 12/16/12 0200  WBC 5.0 4.8  NEUTROABS 2.4  --   HGB 12.2 11.7*  HCT 36.8 35.1*  MCV 75.6* 75.5*  PLT <5* <5*    Studies: Dg Chest 2 View  12/15/2012  *RADIOLOGY REPORT*  Clinical Data: Systemic lupus erythematosus flare.  Bruising with mouth sores, chest pain, shortness of breath and possible fever.  CHEST - 2 VIEW  Comparison: Radiographs 06/12/2012.  Findings: The heart size and mediastinal contours are stable. There is mild aortic tortuosity.  The lungs are clear.  There is no pleural effusion or pneumothorax.  IMPRESSION: Stable chest.  No acute cardiopulmonary process.   Original Report Authenticated By: Carey Bullocks, M.D.     Scheduled Meds: . sodium chloride   Intravenous STAT  . folic acid  1 mg Oral Daily  . [START ON 12/17/2012] influenza  inactive virus vaccine  0.5 mL Intramuscular Tomorrow-1000  . levalbuterol  1.25 mg Nebulization Q8H  . loratadine  10 mg Oral Daily  . methocarbamol  500 mg Oral QID  . methylPREDNISolone (SOLU-MEDROL) injection  60 mg Intravenous Daily  . nicotine  14 mg Transdermal  Q24H  . pantoprazole  40 mg Oral q morning - 10a  . sodium chloride  3 mL Intravenous Q12H  . sodium chloride  3 mL Intravenous Q12H   Continuous Infusions:   Principal Problem:   ITP (idiopathic thrombocytopenic purpura) Active Problems:   Lupus (systemic lupus erythematosus)   Pamella Pert, MD [facility] Pager [phone]. If 7 PM - 7 AM, please contact night-coverage at [email or web address], password [facility] 12/16/2012, 8:12 AM  LOS: 1 day

## 2012-12-16 NOTE — Progress Notes (Signed)
Pt transferred from the ED, admitted to Rm [id number]. Pt comes from home with husband. She is alert and oriented. Ambulatory with standby assist. No skin breakdown noted however she does have several sores/blisters in her mouth and joint pain(MD made aware). Placed on telemetry, running NSR. Oriented to room, instructed to call for assistance before getting out of bed. Resting comfortably at this time, will continue to monitor

## 2012-12-17 DIAGNOSIS — F17200 Nicotine dependence, unspecified, uncomplicated: Secondary | ICD-10-CM

## 2012-12-17 LAB — URINALYSIS, ROUTINE W REFLEX MICROSCOPIC
Ketones, ur: NEGATIVE mg/dL
Nitrite: NEGATIVE
Urobilinogen, UA: 1 mg/dL (ref 0.0–1.0)
pH: 6 (ref 5.0–8.0)

## 2012-12-17 LAB — COMPREHENSIVE METABOLIC PANEL
ALT: 9 U/L (ref 0–35)
Alkaline Phosphatase: 65 U/L (ref 39–117)
BUN: 9 mg/dL (ref 6–23)
Calcium: 9.5 mg/dL (ref 8.4–10.5)
Chloride: 105 mEq/L (ref 96–112)
Creatinine, Ser: 0.67 mg/dL (ref 0.50–1.10)
GFR calc Af Amer: >90 mL/min (ref >90)
GFR calc non Af Amer: >90 mL/min (ref >90)

## 2012-12-17 LAB — HAPTOGLOBIN: Haptoglobin: 191 mg/dL (ref 45–215)

## 2012-12-17 LAB — RETICULOCYTES
Retic Count, Absolute: 41.1 10*3/uL (ref 19.0–186.0)
Retic Ct Pct: 0.9 % (ref 0.4–3.1)

## 2012-12-17 LAB — URINE CULTURE

## 2012-12-17 LAB — CBC WITH DIFFERENTIAL/PLATELET
Eosinophils Absolute: 0 10*3/uL (ref 0.0–0.7)
Eosinophils Relative: 0 % (ref 0–5)
Hemoglobin: 11.3 g/dL — ABNORMAL LOW (ref 12.0–15.0)
Lymphocytes Relative: 20 % (ref 12–46)
Lymphs Abs: 1.8 10*3/uL (ref 0.7–4.0)
MCH: 25.2 pg — ABNORMAL LOW (ref 26.0–34.0)
MCV: 76.2 fL — ABNORMAL LOW (ref 78.0–100.0)
Monocytes Relative: 10 % (ref 3–12)
Neutrophils Relative %: 70 % (ref 43–77)
RBC: 4.49 MIL/uL (ref 3.87–5.11)

## 2012-12-17 LAB — URINE MICROSCOPIC-ADD ON

## 2012-12-17 MED ORDER — ALBUTEROL SULFATE HFA 108 (90 BASE) MCG/ACT IN AERS: 2.0000 | INHALATION_SPRAY | RESPIRATORY_TRACT | Status: DC | PRN

## 2012-12-17 MED ORDER — SODIUM CHLORIDE 0.9 % IV SOLN
1020.0000 mg | Freq: Once | INTRAVENOUS | Status: AC
  Administered 2012-12-17: 1020 mg via INTRAVENOUS
  Filled 2012-12-17: qty 34

## 2012-12-17 MED ORDER — WHITE PETROLATUM GEL
Status: AC
  Administered 2012-12-17: 0.2
  Filled 2012-12-17: qty 5

## 2012-12-17 MED ORDER — LEVALBUTEROL TARTRATE 45 MCG/ACT IN AERO
2.0000 | INHALATION_SPRAY | Freq: Four times a day (QID) | RESPIRATORY_TRACT | Status: DC | PRN
  Administered 2012-12-18: 2 via RESPIRATORY_TRACT
  Filled 2012-12-17: qty 15

## 2012-12-17 NOTE — Progress Notes (Signed)
[facility] PROGRESS NOTE  Dana Kennedy:[id number] DOB: 1966/03/21 DOA: 12/15/2012 PCP: Dorrene German, MD  Brief Narrative: 47 year old woman with known lupus.in 2011 she had easy bruising and bleeding, was found to have a platelet count of about 5000. At that time she was treated with steroids, high the Ig, and right proximal and.Rituxan, with resolution of her symptoms. She has been out of her medications, which include steroids, for several months, probably for economic reasons. Now for the past week she has noted easy bruising and dark red urine.  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 Patient also has Lupus. Appreciate oncology recommendations. Start IVIG/steroids. - no signs of bleeding - significant improvement today in platelets.  - may go home 4/2 if platelets continue to improve and no bleeding.   Code Status: Full Family Communication: none  Disposition Plan: home 4/2  Consultants:  Oncology  Procedures:  none  Antibiotics:  none  HPI/Subjective: - no complaints  Objective: Filed Vitals:   12/16/12 2237 12/16/12 2337 12/17/12 0116 12/17/12 0539  BP: 145/83 153/85 141/104 132/93  Pulse: 114 112 110 102  Temp: 97.9 F (36.6 C) 97.7 F (36.5 C) 98.6 F (37 C) 98.7 F (37.1 C)  TempSrc: Oral Oral Oral Oral  Resp: 18 18 18 18   Height:      Weight:      SpO2: 99% 97% 93% 100%    Intake/Output Summary (Last 24 hours) at 12/17/12 0753 Last data filed at 12/17/12 0539  Gross per 24 hour  Intake   1080 ml  Output      0 ml  Net   1080 ml   Filed Weights   12/16/12 0143 12/16/12 1618 12/16/12 2030  Weight: 79.8 kg (175 lb 14.8 oz) 79.3 kg (174 lb 13.2 oz) 79.379 kg (175 lb)    Exam:   General:  NAD  Cardiovascular: regular rate and rhythm, without MRG  Respiratory: good air movement, clear to auscultation throughout, no wheezing, ronchi or rales  Abdomen: soft, not tender to palpation, positive bowel sounds  MSK: no  peripheral edema  Neuro: CN 2-12 grossly intact, MS 5/5 in all 4  Data Reviewed: Basic Metabolic Panel:  Recent Labs Lab 12/15/12 2113 12/16/12 0200  NA 137 136  K 3.7 3.5  CL 99 99  CO2 28 26  GLUCOSE 90 158*  BUN 16 13  CREATININE 0.76 0.74  CALCIUM 9.4 9.3   Liver Function Tests:  Recent Labs Lab 12/15/12 2113  AST 14  ALT 10  ALKPHOS 70  BILITOT 0.2*  PROT 7.0  ALBUMIN 3.7   CBC:  Recent Labs Lab 12/15/12 2113 12/16/12 0200 12/16/12 1554 12/17/12 0540  WBC 5.0 4.8 3.2* 9.0  NEUTROABS 2.4  --   --  6.3  HGB 12.2 11.7* 12.5 11.3*  HCT 36.8 35.1* 36.8 34.2*  MCV 75.6* 75.5* 75.1* 76.2*  PLT <5* <5* <5* 20*    Studies: Dg Chest 2 View  12/15/2012  *RADIOLOGY REPORT*  Clinical Data: Systemic lupus erythematosus flare.  Bruising with mouth sores, chest pain, shortness of breath and possible fever.  CHEST - 2 VIEW  Comparison: Radiographs 06/12/2012.  Findings: The heart size and mediastinal contours are stable. There is mild aortic tortuosity.  The lungs are clear.  There is no pleural effusion or pneumothorax.  IMPRESSION: Stable chest.  No acute cardiopulmonary process.   Original Report Authenticated By: Carey Bullocks, M.D.     Scheduled Meds: . acetaminophen  650 mg Oral  UD  . aminocaproic acid  5 mL Oral QID  . antiseptic oral rinse  15 mL Mouth Rinse QID  . diphenhydrAMINE  50 mg Oral UD  . ferrous gluconate  324 mg Oral BID  . fluconazole  100 mg Oral Daily  . folic acid  1 mg Oral Daily  . Immune Globulin 5%  1 g/kg Intravenous Q24 Hr x 2  . influenza  inactive virus vaccine  0.5 mL Intramuscular Tomorrow-1000  . levalbuterol  1.25 mg Nebulization Q8H  . loratadine  10 mg Oral Daily  . methocarbamol  500 mg Oral QID  . nicotine  14 mg Transdermal Q24H  . pantoprazole  40 mg Oral q morning - 10a  . predniSONE  20 mg Oral Q lunch  . predniSONE  60 mg Oral Q breakfast  . sodium chloride  3 mL Intravenous Q12H  . sodium chloride  3 mL  Intravenous Q12H   Continuous Infusions:   Principal Problem:   ITP (idiopathic thrombocytopenic purpura) Active Problems:   Lupus (systemic lupus erythematosus)   Pamella Pert, MD [facility] Pager [phone]. If 7 PM - 7 AM, please contact night-coverage at [email or web address], password [facility] 12/17/2012, 7:53 AM  LOS: 2 days

## 2012-12-17 NOTE — Progress Notes (Signed)
  12/17/2012, 8:04 AM  Hospital day: 3 Antibiotics: none  Patient seen, husband at bedside. I have also discussed with Dr Lafe Garin on unit.  Subjective: Slept poorly with steroids and as IVIG did not complete until late. No noted bleeding. Denies shortness of breath when up to BR. Hungry from steroids. Bowels have not moved today. Would like regular inhaler instead of nebs and she will discuss with primary service.  No problems with IVIG given last pm.  Objective: Vital signs in last 24 hours: Blood pressure 132/93, pulse 102, temperature 98.7 F (37.1 C), temperature source Oral, resp. rate 18, height 5\' 5"  (1.651 m), weight 175 lb (79.379 kg), last menstrual period 10/24/2012, SpO2 100.00%.   Intake/Output from previous day: 03/31 0701 - 04/01 0700 In: 1080 [P.O.:1080] Out: -  Intake/Output this shift:    Physical exam: Alert and appropriate, looks comfortable supine on RA. No new areas of submucosal bleeding oropharynx, with improvement in areas on buccal mucosa. No thrush, oral mucosa moist. Tonsils prominent no exudate and not erythematous. Petechiae on LE improved, no new ecchymoses. Lungs clear anteriorly. Heart RRR. Abdomen soft, quiet, not tender including epigastrium. LE no edema, cords, tenderness. No focal neurologic symptoms.  Lab Results:  Recent Labs  12/16/12 1554 12/17/12 0540  WBC 3.2* 9.0  HGB 12.5 11.3*  HCT 36.8 34.2*  PLT <5* 20*  Path review still pending BMET  Recent Labs  12/15/12 2113 12/16/12 0200  NA 137 136  K 3.7 3.5  CL 99 99  CO2 28 26  GLUCOSE 90 158*  BUN 16 13  CREATININE 0.76 0.74  CALCIUM 9.4 9.3  I have ordered chemistries, LDH, retic, another haptoglobin and repeat UA  Now  She was iron deficient in Feb and I do not find that she had IV iron since then. Full dose IV feraheme ordered now.  Studies/Results: Dg Chest 2 View  12/15/2012  *RADIOLOGY REPORT*  Clinical Data: Systemic lupus erythematosus flare.  Bruising with mouth  sores, chest pain, shortness of breath and possible fever.  CHEST - 2 VIEW  Comparison: Radiographs 06/12/2012.  Findings: The heart size and mediastinal contours are stable. There is mild aortic tortuosity.  The lungs are clear.  There is no pleural effusion or pneumothorax.  IMPRESSION: Stable chest.  No acute cardiopulmonary process.   Original Report Authenticated By: Carey Bullocks, M.D.      Assessment/Plan: 1.ITP: platelets increasing quickly with one dose prednisone at 1 mg/kg and IVIG x1.  I do not think a second dose of IVIG is needed today with this and have DCd planned dose today; continue prednisone at 60 mg with breakfast and 20 mg with lunch.  2.iron deficiency by most recent labs Feb 2014: IV feraheme if she has not had this since the Feb labs. Progressive anemia today without IVF. Will recheck labs for hemolysis now. 3.lupus 4.recurrent upper respiratory infections including sinusitis and tonsillitis in Dec/Jan. Some stuffiness now 5.asthma 6.tobacco abuse: had stopped previously, then restarted. 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  May be ok for DC in next 24 hours depending on repeat labs now and in AM.    LIVESAY,LENNIS P

## 2012-12-17 NOTE — Progress Notes (Signed)
12/17/12 patient stated she felt dizzy when taking xopenex nebs. She ask the MD this morning if they would change her back to Albuterol inhaler. Patient has refuse to take 1400 treatment. RT will continue to monitor. Pt is stable.

## 2012-12-17 NOTE — Progress Notes (Signed)
12/17/2012 infuenza vaccine given at 1026 in left deltoid. Lot #: [id number] and expire at 03/17/2013. [facility] RN.

## 2012-12-17 NOTE — Progress Notes (Signed)
FYI - Per MAR report solu-medrol 125 mg IV was given on 3/31 at 0304 AM by Kerby Moors.  Reece Leader, Pharm D, BCPS 12/17/2012 10:17 PM

## 2012-12-18 ENCOUNTER — Other Ambulatory Visit: Payer: Self-pay | Admitting: Oncology

## 2012-12-18 DIAGNOSIS — D693 Immune thrombocytopenic purpura: Secondary | ICD-10-CM

## 2012-12-18 LAB — CBC WITH DIFFERENTIAL/PLATELET
Basophils Absolute: 0 10*3/uL (ref 0.0–0.1)
Eosinophils Absolute: 0 10*3/uL (ref 0.0–0.7)
Eosinophils Relative: 0 % (ref 0–5)
Lymphs Abs: 4.1 10*3/uL — ABNORMAL HIGH (ref 0.7–4.0)
MCH: 25.4 pg — ABNORMAL LOW (ref 26.0–34.0)
MCV: 77 fL — ABNORMAL LOW (ref 78.0–100.0)
Platelets: 47 10*3/uL — ABNORMAL LOW (ref 150–400)
RDW: 16.5 % — ABNORMAL HIGH (ref 11.5–15.5)

## 2012-12-18 LAB — URINE CULTURE: Colony Count: 9000

## 2012-12-18 MED ORDER — FLUCONAZOLE 100 MG PO TABS: 100.0000 mg | ORAL_TABLET | Freq: Every day | ORAL | Status: AC

## 2012-12-18 MED ORDER — PREDNISONE 20 MG PO TABS: 20.0000 mg | ORAL_TABLET | Freq: Every day | ORAL | Status: DC

## 2012-12-18 MED ORDER — HYDROCODONE-HOMATROPINE 5-1.5 MG/5ML PO SYRP: 5.0000 mL | ORAL_SOLUTION | Freq: Four times a day (QID) | ORAL | Status: DC | PRN

## 2012-12-18 MED ORDER — NICOTINE 14 MG/24HR TD PT24: 1.0000 | MEDICATED_PATCH | TRANSDERMAL | Status: DC

## 2012-12-18 MED ORDER — ALBUTEROL SULFATE HFA 108 (90 BASE) MCG/ACT IN AERS: 2.0000 | INHALATION_SPRAY | Freq: Four times a day (QID) | RESPIRATORY_TRACT | Status: DC | PRN

## 2012-12-18 MED ORDER — PREDNISONE 20 MG PO TABS: 60.0000 mg | ORAL_TABLET | Freq: Every day | ORAL | Status: DC

## 2012-12-18 NOTE — Discharge Summary (Signed)
Physician Discharge Summary  Dana Kennedy MRN: [id number] DOB/AGE: Feb 08, 1966 47 y.o.  PCP: Dorrene German, MD   Admit date: 12/15/2012 Discharge date: 12/18/2012  Discharge Diagnoses:      ITP (idiopathic thrombocytopenic purpura) Active Problems:   Lupus (systemic lupus erythematosus)     Medication List    STOP taking these medications       amoxicillin 500 MG tablet  Commonly known as:  AMOXIL     hydroxychloroquine 200 MG tablet  Commonly known as:  PLAQUENIL     meloxicam 15 MG tablet  Commonly known as:  MOBIC      TAKE these medications       albuterol 108 (90 BASE) MCG/ACT inhaler  Commonly known as:  PROVENTIL HFA;VENTOLIN HFA  Inhale 2 puffs into the lungs every 6 (six) hours as needed. For shortness of breath     bismuth subsalicylate 262 MG chewable tablet  Commonly known as:  PEPTO BISMOL  Chew 524 mg by mouth as needed. For upset stomach     cetirizine 10 MG tablet  Commonly known as:  ZYRTEC  Take 1 tablet (10 mg total) by mouth daily.     ferrous sulfate 325 (65 FE) MG tablet  Take 325 mg by mouth daily with breakfast.     fluconazole 100 MG tablet  Commonly known as:  DIFLUCAN  Take 1 tablet (100 mg total) by mouth daily.     folic acid 1 MG tablet  Commonly known as:  FOLVITE  Take 1 tablet (1 mg total) by mouth daily.     HYDROcodone-homatropine 5-1.5 MG/5ML syrup  Commonly known as:  HYCODAN  Take 5 mLs by mouth every 6 (six) hours as needed for cough.     methocarbamol 500 MG tablet  Commonly known as:  ROBAXIN  Take 500 mg by mouth 4 (four) times daily.     nicotine 14 mg/24hr patch  Commonly known as:  NICODERM CQ - dosed in mg/24 hours  Place 1 patch onto the skin daily.     pantoprazole 40 MG tablet  Commonly known as:  PROTONIX  Take 40 mg by mouth every morning.     PERCOCET 5-325 MG per tablet  Generic drug:  oxyCODONE-acetaminophen  Take 1 tablet by mouth every 8 (eight) hours as needed for pain.      predniSONE 20 MG tablet  Commonly known as:  DELTASONE  Take 1 tablet (20 mg total) by mouth daily with lunch.     predniSONE 20 MG tablet  Commonly known as:  DELTASONE  Take 3 tablets (60 mg total) by mouth daily with breakfast.        Discharge Condition: Stable  Disposition: 01-Home or Self Care   Consults:  Hematology oncology  Significant Diagnostic Studies: Dg Chest 2 View  12/15/2012  *RADIOLOGY REPORT*  Clinical Data: Systemic lupus erythematosus flare.  Bruising with mouth sores, chest pain, shortness of breath and possible fever.  CHEST - 2 VIEW  Comparison: Radiographs 06/12/2012.  Findings: The heart size and mediastinal contours are stable. There is mild aortic tortuosity.  The lungs are clear.  There is no pleural effusion or pneumothorax.  IMPRESSION: Stable chest.  No acute cardiopulmonary process.   Original Report Authenticated By: Carey Bullocks, M.D.        Microbiology: Recent Results (from the past 240 hour(s))  URINE CULTURE     Status: None   Collection Time    12/15/12 10:30 PM  Result Value Range Status   Specimen Description URINE, CLEAN CATCH   Final   Special Requests CX ADDED AT 2248 ON [id number]   Final   Culture  Setup Time 12/16/2012 02:06   Final   Colony Count 45,000 COLONIES/ML   Final   Culture     Final   Value: Multiple bacterial morphotypes present, none predominant. Suggest appropriate recollection if clinically indicated.   Report Status 12/17/2012 FINAL   Final  MRSA PCR SCREENING     Status: None   Collection Time    12/16/12  9:36 AM      Result Value Range Status   MRSA by PCR NEGATIVE  NEGATIVE Final   Comment:            The GeneXpert MRSA Assay (FDA     approved for NASAL specimens     only), is one component of a     comprehensive MRSA colonization     surveillance program. It is not     intended to diagnose MRSA     infection nor to guide or     monitor treatment for     MRSA infections.     Labs: Results  for orders placed during the hospital encounter of 12/15/12 (from the past 48 hour(s))  CBC     Status: Abnormal   Collection Time    12/16/12  3:54 PM      Result Value Range   WBC 3.2 (*) 4.0 - 10.5 K/uL   RBC 4.90  3.87 - 5.11 MIL/uL   Hemoglobin 12.5  12.0 - 15.0 g/dL   HCT 04.5  40.9 - 81.1 %   MCV 75.1 (*) 78.0 - 100.0 fL   MCH 25.5 (*) 26.0 - 34.0 pg   MCHC 34.0  30.0 - 36.0 g/dL   RDW 91.4 (*) 78.2 - 95.6 %   Platelets <5 (*) 150 - 400 K/uL   Comment: REPEATED TO VERIFY     SPECIMEN CHECKED FOR CLOTS     CRITICAL VALUE NOTED.  VALUE IS CONSISTENT WITH PREVIOUSLY REPORTED AND CALLED VALUE.  CBC WITH DIFFERENTIAL     Status: Abnormal   Collection Time    12/17/12  5:40 AM      Result Value Range   WBC 9.0  4.0 - 10.5 K/uL   RBC 4.49  3.87 - 5.11 MIL/uL   Hemoglobin 11.3 (*) 12.0 - 15.0 g/dL   HCT 21.3 (*) 08.6 - 57.8 %   MCV 76.2 (*) 78.0 - 100.0 fL   MCH 25.2 (*) 26.0 - 34.0 pg   MCHC 33.0  30.0 - 36.0 g/dL   RDW 46.9 (*) 62.9 - 52.8 %   Platelets 20 (*) 150 - 400 K/uL   Comment: CRITICAL VALUE NOTED.  VALUE IS CONSISTENT WITH PREVIOUSLY REPORTED AND CALLED VALUE.   Neutrophils Relative 70  43 - 77 %   Neutro Abs 6.3  1.7 - 7.7 K/uL   Lymphocytes Relative 20  12 - 46 %   Lymphs Abs 1.8  0.7 - 4.0 K/uL   Monocytes Relative 10  3 - 12 %   Monocytes Absolute 0.9  0.1 - 1.0 K/uL   Eosinophils Relative 0  0 - 5 %   Eosinophils Absolute 0.0  0.0 - 0.7 K/uL   Basophils Relative 0  0 - 1 %   Basophils Absolute 0.0  0.0 - 0.1 K/uL  LACTATE DEHYDROGENASE     Status: None   Collection Time  12/17/12  8:38 AM      Result Value Range   LDH 206  94 - 250 U/L  RETICULOCYTES     Status: None   Collection Time    12/17/12  8:38 AM      Result Value Range   Retic Ct Pct 0.9  0.4 - 3.1 %   RBC. 4.57  3.87 - 5.11 MIL/uL   Retic Count, Manual 41.1  19.0 - 186.0 K/uL  HAPTOGLOBIN     Status: None   Collection Time    12/17/12  8:38 AM      Result Value Range   Haptoglobin  191  45 - 215 mg/dL  COMPREHENSIVE METABOLIC PANEL     Status: Abnormal   Collection Time    12/17/12  8:38 AM      Result Value Range   Sodium 140  135 - 145 mEq/L   Potassium 3.6  3.5 - 5.1 mEq/L   Chloride 105  96 - 112 mEq/L   CO2 24  19 - 32 mEq/L   Glucose, Bld 116 (*) 70 - 99 mg/dL   BUN 9  6 - 23 mg/dL   Creatinine, Ser 4.09  0.50 - 1.10 mg/dL   Calcium 9.5  8.4 - 81.1 mg/dL   Total Protein 9.0 (*) 6.0 - 8.3 g/dL   Albumin 3.5  3.5 - 5.2 g/dL   AST 10  0 - 37 U/L   ALT 9  0 - 35 U/L   Alkaline Phosphatase 65  39 - 117 U/L   Total Bilirubin 0.2 (*) 0.3 - 1.2 mg/dL   GFR calc non Af Amer >90  >90 mL/min   GFR calc Af Amer >90  >90 mL/min   Comment:            The eGFR has been calculated     using the CKD EPI equation.     This calculation has not been     validated in all clinical     situations.     eGFR's persistently     <90 mL/min signify     possible Chronic Kidney Disease.  URINALYSIS, ROUTINE W REFLEX MICROSCOPIC     Status: Abnormal   Collection Time    12/17/12 12:31 PM      Result Value Range   Color, Urine YELLOW  YELLOW   APPearance CLOUDY (*) CLEAR   Specific Gravity, Urine 1.029  1.005 - 1.030   pH 6.0  5.0 - 8.0   Glucose, UA NEGATIVE  NEGATIVE mg/dL   Hgb urine dipstick NEGATIVE  NEGATIVE   Bilirubin Urine NEGATIVE  NEGATIVE   Ketones, ur NEGATIVE  NEGATIVE mg/dL   Protein, ur NEGATIVE  NEGATIVE mg/dL   Urobilinogen, UA 1.0  0.0 - 1.0 mg/dL   Nitrite NEGATIVE  NEGATIVE   Leukocytes, UA SMALL (*) NEGATIVE  URINE MICROSCOPIC-ADD ON     Status: Abnormal   Collection Time    12/17/12 12:31 PM      Result Value Range   Squamous Epithelial / LPF FEW (*) RARE   WBC, UA 7-10  <3 WBC/hpf   RBC / HPF 0-2  <3 RBC/hpf   Bacteria, UA FEW (*) RARE   Casts HYALINE CASTS (*) NEGATIVE   Crystals CA OXALATE CRYSTALS (*) NEGATIVE   Urine-Other MUCOUS PRESENT    CBC WITH DIFFERENTIAL     Status: Abnormal   Collection Time    12/18/12  6:00 AM  Result  Value Range   WBC 10.5  4.0 - 10.5 K/uL   RBC 4.53  3.87 - 5.11 MIL/uL   Hemoglobin 11.5 (*) 12.0 - 15.0 g/dL   HCT 16.1 (*) 09.6 - 04.5 %   MCV 77.0 (*) 78.0 - 100.0 fL   MCH 25.4 (*) 26.0 - 34.0 pg   MCHC 33.0  30.0 - 36.0 g/dL   RDW 40.9 (*) 81.1 - 91.4 %   Platelets 47 (*) 150 - 400 K/uL   Comment: PLATELET COUNT CONFIRMED BY SMEAR     REPEATED TO VERIFY     SPECIMEN CHECKED FOR CLOTS   Neutrophils Relative 52  43 - 77 %   Neutro Abs 5.5  1.7 - 7.7 K/uL   Lymphocytes Relative 39  12 - 46 %   Lymphs Abs 4.1 (*) 0.7 - 4.0 K/uL   Monocytes Relative 9  3 - 12 %   Monocytes Absolute 0.9  0.1 - 1.0 K/uL   Eosinophils Relative 0  0 - 5 %   Eosinophils Absolute 0.0  0.0 - 0.7 K/uL   Basophils Relative 0  0 - 1 %   Basophils Absolute 0.0  0.0 - 0.1 K/uL     HPI :* 47 y.o. 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 She was treated then with steroids/ IVIG and eventually resolved with Rituxan. 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 She was off treatment until very prolonged sinusitis problems Dec - Jan and with this had drop in platelets consistent with ITP flare. Platelet count improved rapidly with prednisone in late Jan, which was tapered down and off by 11-13-12. I saw her last in office 11-05-12 after which she missed at least 2 appointments with me and we were unable to reach her by phone. Last CBC at my office was 11-12-12 with WBC 5.0, Hgb 11.6 and plt 276. She had iron deficiency documented in Feb.  Patient presented to ED last pm with complaints of 5-7 days of unexplained bruising and some oral bleeding. CBC had WBC 5.0, Hgb 12.2, plt <5.   noted bleeding since admission last pm, tho multiple bruises on extremities, SQ bleeds in mouth and dark urine. No HA or other neurologic symptoms. Has had sinus congestion just today, this having been much better in  past few weeks. Denies lower respiratory symptoms.     HOSPITAL COURSE: * 1. Recurrent severe thrombocytopenia in patient with history of ITP in setting of lupus: Treated with prednisone 1 mg/kg/day now. Also given IVIG x 2 days. Bleeding precautions. According to Dr. Darrold Span , she recommends to not transfuse unless significant active bleeding. Daily CBC. She is not in DIC, not clinically septic, no neurologic or renal findings to suggest HUS/TTP. HIV negative 08-2010. Patient leaned close outpatient followup  2.previous AIHA in 2011: Hgb dropping now with urine as above, tho other labs including Tbili, LDH, haptoglobin, DAT not showing much from this standpoint at least as yet. Steroids as above, continue folate 1 mg daily and follow   3.iron deficiency apparently from various blood loss previously:IV feraheme given 12-17-12 . I will resume her po iron . The patient did not require any transfusions  4.hx recurrent sinusitis and tonsillitis: known to Dr Narda Bonds   5. 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 Degenerative knee problems and new pain left elbow  7. Tobacco abuse: may need nicotine patch      Discharge Exam:  Blood pressure 144/95, pulse 88, temperature 98 F (36.7 C), temperature source Oral, resp. rate 18, height 5\' 5"  (1.651 m), weight 79.243 kg (174 lb 11.2 oz), last menstrual period 10/24/2012, SpO2 98.00%.          Future Appointments Provider Department Dept Phone   12/24/2012 2:00 PM Mauri Brooklyn [facility] MEDICAL ONCOLOGY [phone]   12/24/2012 2:30 PM Reece Packer, MD [facility] MEDICAL ONCOLOGY [phone]        Signed: Richarda Overlie 12/18/2012, 12:02 PM

## 2012-12-18 NOTE — Progress Notes (Signed)
  12/18/2012, 8:14 AM  Hospital day: 4 Steroids day 3; IVIG given 12-16-12 Diflucan day 2   Subjective: Awake, alert and feeling well this am, with no bleeding. Was able to sleep last pm, walked in hall yesterday. Tolerated IV iron full dose on 4-1 without difficulty. Bowels moved. Was to have seen Dr Kellie Simmering today at office.  AM platelet count pending at time of visit now.  Objective: Vital signs in last 24 hours: Blood pressure 132/84, pulse 58, temperature 98.6 F (37 C), temperature source Oral, resp. rate 17, height 5\' 5"  (1.651 m), weight 174 lb 11.2 oz (79.243 kg), last menstrual period 10/24/2012, SpO2 98.00%.   Intake/Output from previous day: 04/01 0701 - 04/02 0700 In: 1080 [P.O.:1080] Out: -  Intake/Output this shift:    Physical exam: Comfortable seated in bed on RA. Still nasally congested. PERRL, not icteric. Oral mucosa moist with areas of submucosal bleeding improved and ulcerated area on left side of tongue only ~ 2 mm now, oral candida improved. Lungs without wheezes or rales. Heart RRR. Abdomen soft, not tender including episgastrium. LE no edema, cords, tenderness. IV NSL in UE site ok. No increase in petechiae or ecchymoses.  Lab Results:  Recent Labs  12/17/12 0540 12/18/12 0600  WBC 9.0 10.5  HGB 11.3* 11.5*  HCT 34.2* 34.9*  PLT 20* PENDING   BMET  Recent Labs  12/16/12 0200 12/17/12 0838  NA 136 140  K 3.5 3.6  CL 99 105  CO2 26 24  GLUCOSE 158* 116*  BUN 13 9  CREATININE 0.74 0.67  CALCIUM 9.3 9.5   Spoke with lab as path smear review STILL has not happened from 12-16-12, which is due to some problem with order in EMR, possibly corrected now.  Studies/Results: No results found.   Assessment/Plan: 1.ITP: platelets pending now, on  prednisone at 1 mg/kg and IVIG x1 on 12-16-12. Hopefully can DC home today on prednisone, with close outpatient follow up at my office. 2.iron deficiency by most recent labs Feb 2014: IV feraheme given  12-17-12 3.lupus: now followed by Dr Kellie Simmering 4.recurrent upper respiratory infections including sinusitis and tonsillitis in Dec/Jan. Some stuffiness now  5.asthma  6.tobacco abuse: had stopped previously, then restarted. 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  Please call if need to discuss when hospitalist rounds, [phone]  Discussed with RN at bedside  Brynlei Klausner P

## 2012-12-19 LAB — PATHOLOGIST SMEAR REVIEW

## 2012-12-20 ENCOUNTER — Other Ambulatory Visit: Payer: Medicaid Other | Admitting: Lab

## 2012-12-20 ENCOUNTER — Ambulatory Visit: Payer: Medicaid Other | Admitting: Physician Assistant

## 2012-12-22 ENCOUNTER — Other Ambulatory Visit: Payer: Self-pay | Admitting: Oncology

## 2012-12-24 ENCOUNTER — Other Ambulatory Visit (HOSPITAL_BASED_OUTPATIENT_CLINIC_OR_DEPARTMENT_OTHER): Payer: Medicaid Other | Admitting: Lab

## 2012-12-24 ENCOUNTER — Ambulatory Visit (HOSPITAL_BASED_OUTPATIENT_CLINIC_OR_DEPARTMENT_OTHER): Payer: Medicaid Other | Admitting: Physician Assistant

## 2012-12-24 VITALS — BP 143/87 | HR 102 | Temp 97.3°F | Resp 18 | Ht 65.0 in | Wt 179.2 lb

## 2012-12-24 DIAGNOSIS — D693 Immune thrombocytopenic purpura: Secondary | ICD-10-CM

## 2012-12-24 LAB — CBC WITH DIFFERENTIAL/PLATELET
Basophils Absolute: 0 10*3/uL (ref 0.0–0.1)
Eosinophils Absolute: 0.1 10*3/uL (ref 0.0–0.5)
HCT: 36.1 % (ref 34.8–46.6)
HGB: 11.6 g/dL (ref 11.6–15.9)
LYMPH%: 36.9 % (ref 14.0–49.7)
MCV: 79.7 fL (ref 79.5–101.0)
MONO%: 5.8 % (ref 0.0–14.0)
NEUT#: 7.1 10*3/uL — ABNORMAL HIGH (ref 1.5–6.5)
NEUT%: 56 % (ref 38.4–76.8)
Platelets: 164 10*3/uL (ref 145–400)
RBC: 4.53 10*6/uL (ref 3.70–5.45)

## 2012-12-24 LAB — COMPREHENSIVE METABOLIC PANEL
Alkaline Phosphatase: 62 U/L (ref 39–117)
BUN: 14 mg/dL (ref 6–23)
CO2: 29 mEq/L (ref 19–32)
Creatinine, Ser: 1.07 mg/dL (ref 0.50–1.10)
Glucose, Bld: 134 mg/dL — ABNORMAL HIGH (ref 70–99)
Total Bilirubin: 0.4 mg/dL (ref 0.3–1.2)

## 2012-12-24 LAB — TECHNOLOGIST REVIEW

## 2012-12-24 NOTE — Patient Instructions (Addendum)
Decrease your prednisone to 2 tablets ( 40 mg) by mouth every morning with breakfast Follow up as previously scheduled on 12/31/12

## 2012-12-29 NOTE — Progress Notes (Signed)
OFFICE PROGRESS NOTE   12/24/2012  Physicians:E.Avbuerre, B.Marshall, D.Blackmon   INTERVAL HISTORY:   Patient is seen, together with her husband , in continuing attention to recurrent thrombocytopenia with history of ITP.  She has been on prednisone since 10-16-12, this at 1 mg/kg/day (80 mg/day) until 10-24-12 and on 60 mg/day since then. This appointment was rescheduled due to weather. Her current total dose of prednisone is 80 mg, taken 60 mg at 5:30 am and 20 mg at 12:30 pm.   Patient presented in Dec 2011 with bleeding (vaginal, epistaxis, gums), with hemoglobin 5 and platelets <5K. This was determined to be blood loss anemia as well as AIHA, and ITP. She was diagnosed with lupus during this evaluation. She was treated with steroids and IVIG initially, then eventually resolved the thrombocytopenia with Rituxan. 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  She had acute sinusitis beginning in Dec 2013, treated with at least 3 different full rounds of antibiotics. At regularly scheduled visit at this office 1218-13 platelet count was 78k with Hgb 11.8. She continued with acute sinusitis symptoms at follow up visit 09-30-12, with platelets 58k and Hgb 11. Steroids were begun 10-16-12 with Hgb 10.7 and plt 33k; she did not seem to be hemolyzing then with normal LDH and bilirubin.  By 10-24-12 platelets were 199k and Hgb 10.9. She has had no bleeding or bruising. Sinus symptoms had improved to baseline until ~ 5 days ago when she again has had sinus congestion and drainage with enlarged and sore tonsils and some cough without shortness of breath or fever (on the steroids). She is scheduled to see ENT for new patient evaluation on 11-11-12.  Her primary complaint is not being able to sleep. This is most likely related to the prednisone.She has had no N/V. She is having no problems with the left hip replacement  (done fall 2013 for AVN),  Knees still hurt. Remainder of 10 point Review of Systems negative/ unchanged..  Objective:  Vital signs in last 24 hours:  BP 143/87  Pulse 102  Temp(Src) 97.3 F (36.3 C) (Oral)  Resp 18  Ht 5\' 5"  (1.651 m)  Wt 179 lb 3.2 oz (81.285 kg)  BMI 29.82 kg/m2  LMP 10/24/2012  Weight is up 4.5 lbs since 12/17/2012.  Alert, talkative, appropriate.  HEENT:PERRLA, sclera clear, anicteric and tonsillar tissue bulky with some erythema bilaterally, no exudate. Nasal turbinates boggy, no bleeding. LymphaticsCervical, supraclavicular, and axillary nodes normal. Resp: clear to auscultation bilaterally and normal percussion bilaterally Cardio: regular rate and rhythm GI: soft, non-tender; bowel sounds normal; no masses,  no organomegaly Extremities: extremities normal, atraumatic, no cyanosis or edema Neuro:nonfocal Skin without rash or ecchymosis  Lab Results:  Results for orders placed in visit on 12/24/12  TECHNOLOGIST REVIEW      Result Value Range   Technologist Review Variant lymphs present    CBC WITH DIFFERENTIAL      Result Value Range   WBC 12.8 (*) 3.9 - 10.3 10e3/uL   NEUT# 7.1 (*) 1.5 - 6.5 10e3/uL   HGB 11.6  11.6 - 15.9 g/dL   HCT 78.2  95.6 - 21.3 %   Platelets 164  145 - 400 10e3/uL   MCV 79.7  79.5 - 101.0 fL   MCH 25.6  25.1 - 34.0 pg   MCHC 32.1  31.5 - 36.0 g/dL   RBC 0.86  5.78 - 4.69 10e6/uL  RDW 16.5 (*) 11.2 - 14.5 %   lymph# 4.7 (*) 0.9 - 3.3 10e3/uL   MONO# 0.7  0.1 - 0.9 10e3/uL   Eosinophils Absolute 0.1  0.0 - 0.5 10e3/uL   Basophils Absolute 0.0  0.0 - 0.1 10e3/uL   NEUT% 56.0  38.4 - 76.8 %   LYMPH% 36.9  14.0 - 49.7 %   MONO% 5.8  0.0 - 14.0 %   EOS% 1.1  0.0 - 7.0 %   BASO% 0.2  0.0 - 2.0 %  COMPREHENSIVE METABOLIC PANEL      Result Value Range   Sodium 144  135 - 145 mEq/L   Potassium 3.9  3.5 - 5.3 mEq/L   Chloride 107  96 - 112 mEq/L   CO2 29  19 - 32 mEq/L   Glucose, Bld 134 (*) 70 - 99 mg/dL   BUN 14  6 -  23 mg/dL   Creatinine, Ser 4.78  0.50 - 1.10 mg/dL   Total Bilirubin 0.4  0.3 - 1.2 mg/dL   Alkaline Phosphatase 62  39 - 117 U/L   AST 14  0 - 37 U/L   ALT 14  0 - 35 U/L   Total Protein 6.9  6.0 - 8.3 g/dL   Albumin 4.2  3.5 - 5.2 g/dL   Calcium 9.6  8.4 - 29.5 mg/dL     Studies/Results:  No results found.  Medications: I have reviewed the patient's current medications. She will decrease prednisone to 40 mg a day  Patient discussed with Dr. Darrold Span  Assessment/Plan: 1.Thrombocytopenia without bleeding, morphology consistent with recurrent ITP, possibly exacerbated by recent respiratory illness(es). Platelets rapidly increased with steroids and will continue to try to taper off quickly if tolerates.  2.previous AIHA and iron deficiency anemia: note heavier menses in Dec and hip replacement in Nov.  Iron studies low on 10-24-12 ( iron 22. %sat 7 and ferritin 20). I have encouraged her to continue oral iron and may need to consider IV replacement. 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 Symptoms preceded any steroids for ITP in 2011. Arthritis knees, left knee surgery on hold with present problems  4.no flu vaccine due to all of the respiratory infections 5.hx CIN 1 and 2, followed by gyn  6.long tobacco: previously stopped, then resumed. Has cut back significantly and will try to stop. Hopefully she can obtain nicotine patches as these helped before.  7.systemic lupus: known to Dr Nickola Major 8. Recurrent upper respiratory infections, with bulky tonsils. For ENT evaluation    Follow up as previously scheduled 12/31/12 with repeat CBC.   Tiana Loft E,PA-C

## 2012-12-31 ENCOUNTER — Encounter: Payer: Self-pay | Admitting: Physician Assistant

## 2012-12-31 ENCOUNTER — Other Ambulatory Visit: Payer: Self-pay | Admitting: Oncology

## 2012-12-31 ENCOUNTER — Ambulatory Visit (HOSPITAL_BASED_OUTPATIENT_CLINIC_OR_DEPARTMENT_OTHER): Payer: Medicaid Other | Admitting: Physician Assistant

## 2012-12-31 ENCOUNTER — Other Ambulatory Visit (HOSPITAL_BASED_OUTPATIENT_CLINIC_OR_DEPARTMENT_OTHER): Payer: Medicaid Other | Admitting: Lab

## 2012-12-31 VITALS — BP 117/69 | HR 88 | Temp 98.5°F | Resp 20 | Ht 65.0 in | Wt 178.8 lb

## 2012-12-31 DIAGNOSIS — D693 Immune thrombocytopenic purpura: Secondary | ICD-10-CM

## 2012-12-31 LAB — CBC WITH DIFFERENTIAL/PLATELET
BASO%: 0.3 % (ref 0.0–2.0)
Eosinophils Absolute: 0.1 10*3/uL (ref 0.0–0.5)
HCT: 38.3 % (ref 34.8–46.6)
LYMPH%: 36.6 % (ref 14.0–49.7)
MONO#: 0.7 10*3/uL (ref 0.1–0.9)
NEUT#: 4 10*3/uL (ref 1.5–6.5)
NEUT%: 52.3 % (ref 38.4–76.8)
Platelets: 7 10*3/uL — CL (ref 145–400)
WBC: 7.6 10*3/uL (ref 3.9–10.3)
lymph#: 2.8 10*3/uL (ref 0.9–3.3)
nRBC: 0 % (ref 0–0)

## 2012-12-31 NOTE — Patient Instructions (Addendum)
Your platelet count has decreased dramatically Take another 40 mg prednisone today Increase your prednisone back to 80 mg daily You'll start chemotherapy with Rituxan given weekly for 4 weeks starting on 01/03/2013 at 9 AM

## 2013-01-02 ENCOUNTER — Telehealth: Payer: Self-pay | Admitting: *Deleted

## 2013-01-02 ENCOUNTER — Telehealth: Payer: Self-pay | Admitting: Oncology

## 2013-01-02 ENCOUNTER — Other Ambulatory Visit: Payer: Self-pay | Admitting: Oncology

## 2013-01-02 DIAGNOSIS — D693 Immune thrombocytopenic purpura: Secondary | ICD-10-CM

## 2013-01-02 NOTE — Telephone Encounter (Signed)
s.w. pt and advised on 4.18.14 appt....pt aware to come by scheduling to get new schedule

## 2013-01-02 NOTE — Telephone Encounter (Signed)
Per staff message and POF I have scheduled appts.  JMW  

## 2013-01-03 ENCOUNTER — Ambulatory Visit (HOSPITAL_BASED_OUTPATIENT_CLINIC_OR_DEPARTMENT_OTHER): Payer: Medicaid Other

## 2013-01-03 ENCOUNTER — Ambulatory Visit (HOSPITAL_BASED_OUTPATIENT_CLINIC_OR_DEPARTMENT_OTHER): Payer: Medicaid Other | Admitting: Oncology

## 2013-01-03 ENCOUNTER — Telehealth: Payer: Self-pay | Admitting: Oncology

## 2013-01-03 ENCOUNTER — Encounter: Payer: Self-pay | Admitting: Oncology

## 2013-01-03 ENCOUNTER — Other Ambulatory Visit: Payer: Self-pay

## 2013-01-03 ENCOUNTER — Other Ambulatory Visit: Payer: Self-pay | Admitting: Oncology

## 2013-01-03 ENCOUNTER — Other Ambulatory Visit (HOSPITAL_BASED_OUTPATIENT_CLINIC_OR_DEPARTMENT_OTHER): Payer: Medicaid Other | Admitting: Lab

## 2013-01-03 VITALS — BP 139/80 | HR 94 | Temp 98.4°F | Resp 20

## 2013-01-03 DIAGNOSIS — Z5112 Encounter for antineoplastic immunotherapy: Secondary | ICD-10-CM

## 2013-01-03 DIAGNOSIS — D693 Immune thrombocytopenic purpura: Secondary | ICD-10-CM

## 2013-01-03 DIAGNOSIS — R0989 Other specified symptoms and signs involving the circulatory and respiratory systems: Secondary | ICD-10-CM

## 2013-01-03 DIAGNOSIS — R6889 Other general symptoms and signs: Secondary | ICD-10-CM

## 2013-01-03 DIAGNOSIS — D509 Iron deficiency anemia, unspecified: Secondary | ICD-10-CM

## 2013-01-03 LAB — CBC WITH DIFFERENTIAL/PLATELET
Basophils Absolute: 0 10*3/uL (ref 0.0–0.1)
EOS%: 0.6 % (ref 0.0–7.0)
Eosinophils Absolute: 0.1 10*3/uL (ref 0.0–0.5)
HCT: 35.8 % (ref 34.8–46.6)
HGB: 11.6 g/dL (ref 11.6–15.9)
MCH: 26.4 pg (ref 25.1–34.0)
NEUT#: 5.6 10*3/uL (ref 1.5–6.5)
NEUT%: 56.9 % (ref 38.4–76.8)
RDW: 17.6 % — ABNORMAL HIGH (ref 11.2–14.5)
lymph#: 3.5 10*3/uL — ABNORMAL HIGH (ref 0.9–3.3)

## 2013-01-03 MED ORDER — DIPHENHYDRAMINE HCL 25 MG PO CAPS
50.0000 mg | ORAL_CAPSULE | Freq: Once | ORAL | Status: AC
  Administered 2013-01-03: 50 mg via ORAL

## 2013-01-03 MED ORDER — SODIUM CHLORIDE 0.9 % IV SOLN
375.0000 mg/m2 | Freq: Once | INTRAVENOUS | Status: AC
  Administered 2013-01-03: 700 mg via INTRAVENOUS
  Filled 2013-01-03: qty 70

## 2013-01-03 MED ORDER — NICOTINE 14 MG/24HR TD PT24: 1.0000 | MEDICATED_PATCH | TRANSDERMAL | Status: DC

## 2013-01-03 MED ORDER — ACETAMINOPHEN 325 MG PO TABS
650.0000 mg | ORAL_TABLET | Freq: Once | ORAL | Status: AC
  Administered 2013-01-03: 650 mg via ORAL

## 2013-01-03 MED ORDER — AMOXICILLIN 500 MG PO CAPS: 500.0000 mg | ORAL_CAPSULE | Freq: Three times a day (TID) | ORAL | Status: DC

## 2013-01-03 MED ORDER — SODIUM CHLORIDE 0.9 % IV SOLN
Freq: Once | INTRAVENOUS | Status: AC
  Administered 2013-01-03: 09:00:00 via INTRAVENOUS

## 2013-01-03 MED ORDER — DIPHENHYDRAMINE HCL 25 MG PO TABS
25.0000 mg | ORAL_TABLET | Freq: Once | ORAL | Status: AC
  Administered 2013-01-03: 25 mg via ORAL
  Filled 2013-01-03: qty 1

## 2013-01-03 NOTE — Progress Notes (Signed)
OFFICE PROGRESS NOTE   01/03/2013   Physicians: W.Truslow, E.Avbuerre  INTERVAL HISTORY:   Patient is seen in infusion area for unscheduled visit, due to Rituxan reaction as this is being given for recurrent ITP. Husband and RNs present. Patient had complained of respiratory symptoms as Rituxan rate was increased per protocol; these resolved promptly with holding Rituxan such that premeds will be repeated and she will be rechallenged now. 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 She was treated then with steroids/ IVIG and eventually resolved with Rituxan. 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 She was off treatment until very prolonged sinusitis problems Dec - Jan and with this had drop in platelets consistent with ITP flare. Platelet count improved rapidly with prednisone in late Jan, which was tapered down and off by 11-13-12. I saw her last in office 11-05-12 after which she missed at least 2 appointments with me and we were unable to reach her by phone. Last CBC at my office was 11-12-12 with WBC 5.0, Hgb 11.6 and plt 276. She then presented to [facility] ED on 12-15-12 with 5-7 days of bruising and oral bleeding, with platelets <5k, hgb 12.2 and WBC 5.0. She had not been compliant with Plaquenil by Dr Kellie Simmering for last 3 month prescription. She was begun on steroids with improvement in platelets up to 47k by 12-18-12. She had iron deficiency documented in Feb.and did receive IV feraheme during that hospitalization. She was DC home on 12-18-12 with plans for close follow up at [facility], tho I believe she still missed lab and visit shortly after the DC. CBC on 12-24-12 had WBC 12.8 on steroids, Hgb 11.6 and plt up to 164k. 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 By 12-31-12, platelets were back to 7k, without overt bleeding. With previous good response to Rituxan after inadequate response to  initial prednisone and IVIG in 2011, plan now is to treat with Rituxan weekly x 4.   No obvious problems from the prednisone, which was again increased on 12-31-12 pending start of Rituxan today. Sore throat just since coming to infusion area today. No fever on the steroids. No overt bleeding. No cough or chest pain. No sinus congestion now. Eating and drinking fluids well. No increased hip pain. She has been able to stop smoking using Nicoderm patch 14 mg, which is covered on her medicaid. Remainder of 10 point Review of Systems negative/ unchanged..  Objective:  Vital signs in last 24 hours: BP 131/88, temp 97.7, HR 90 regular, respirations not labored on [place] O2 following reaction, with O2 sat 100%.  Alert, appropriate, respirations not labored, looks entirely stable.   HEENT:PERRLA and sclera clear, anicteric Oral mucosa moist and clear. Tonsils prominent bilaterally with some exudate in patches. Lymphaticsno cervical or supraclavicular adenopathy Resp: clear to auscultation bilaterally Cardio: regular rate and rhythm GI: soft, non-tender; bowel sounds normal; no masses,  no organomegaly Extremities: extremities normal, atraumatic, no cyanosis or edema Neuro:nonfocal on exam in recliner in infusion Skin without petechiae or ecchymoses. No rash Peripheral IV site ok  Lab Results:  Results for orders placed in visit on 01/03/13  CBC WITH DIFFERENTIAL      Result Value Range   WBC 9.9  3.9 - 10.3 10e3/uL   NEUT# 5.6  1.5 - 6.5 10e3/uL   HGB 11.6  11.6 - 15.9 g/dL   HCT 16.1  09.6 -  46.6 %   Platelets 8 Large platelets present (*) 145 - 400 10e3/uL   MCV 81.5  79.5 - 101.0 fL   MCH 26.4  25.1 - 34.0 pg   MCHC 32.4  31.5 - 36.0 g/dL   RBC 1.61  0.96 - 0.45 10e6/uL   RDW 17.6 (*) 11.2 - 14.5 %   lymph# 3.5 (*) 0.9 - 3.3 10e3/uL   MONO# 0.7  0.1 - 0.9 10e3/uL   Eosinophils Absolute 0.1  0.0 - 0.5 10e3/uL   Basophils Absolute 0.0  0.0 - 0.1 10e3/uL   NEUT% 56.9  38.4 - 76.8 %    LYMPH% 35.1  14.0 - 49.7 %   MONO% 7.2  0.0 - 14.0 %   EOS% 0.6  0.0 - 7.0 %   BASO% 0.2  0.0 - 2.0 %   nRBC 0  0 - 0 %    Note she had negative hepatitis testing Dec 2011 prior to Rituxan then.  Studies/Results:  No results found.  Medications: I have reviewed the patient's current medications. She has been given written and oral instructions to decrease prednisone to 20 mg on 4-19 AM then stop. She will begin amoxicillin 50 mg tid today and should use salt water or baking soda gargles several times daily. Note Dr Kellie Simmering kindly called and spoke with me directly after this visit, to let me know that patient has not been compliant with plaquenil in past 3 months.  Patient is unable to recall which ENT physician she saw after extended problems with tonsillar infection in past few months, tho that MD had recommended tonsillectomy. The ENT office and office of her PCP are closed for holiday today.   Assessment/Plan:  1.Recurrent ITP in setting of lupus. Initial fair response to prednisone 1 mg/kg/day then rapid decrease again. Rituxan begun today, planning weekly x 4. May be related to more poorly controlled lupus, as she has not been compliant with plaquenil by Dr Kellie Simmering in past 90 days per my conversation with him after this visit. If Rituxan does not resolve thrombocytopenia, may need splenectomy. Note she has not had pre-splenectomy vaccinations per this EMR information, tho may have been done in early 2012. 2.SLE now followed by Dr Kellie Simmering. Plaquenil as above. 3.prolonged, severe sinusitis, tonsillitis, bronchitis this past winter. Tonsils again appear infected and are symptomatic just today. Medications as above and will try to identify correct ENT for follow up 4. Long cigarette use, now discontinued. Refill nicotine patches. 5.post left hip replacement for avascular necrosis, fall 2013 6. Iron deficiency anemia by labs Feb 2014: post IV feraheme 12-17-12. 7.mild rituxan reaction rate  related today: able to complete treatment subsequently without problems  I will see her 4-23 with CBC and she will have second Rituxan on 01-10-13. Patient and husband understand instructions and agree with plan.  Reece Packer, MD   01/03/2013, 8:19 PM

## 2013-01-03 NOTE — Progress Notes (Signed)
Rituxan infusion completed without any further complaints of dyspnea.

## 2013-01-03 NOTE — Patient Instructions (Addendum)
Prednisone 20 mg on Sat 4-19 AM then stop.  Amoxicillin 500 mg three times daily for tonsils. Use salt water or baking soda in water gargles several times daily also.

## 2013-01-03 NOTE — Progress Notes (Signed)
OFFICE PROGRESS NOTE   12/31/2012  Physicians:E.Avbuerre, B.Marshall, D.Blackmon   INTERVAL HISTORY:   Patient is seen, together with her husband , in continuing attention to recurrent thrombocytopenia with history of ITP.  She has been on prednisone since 10-16-12, this at 1 mg/kg/day (80 mg/day) until 10-24-12 and on 60 mg/day since then. This appointment was rescheduled due to weather. Her current total dose of prednisone was 80 mg, taken 60 mg at 5:30 am and 20 mg at 12:30 pm. And she had improvement in her platelet count 264,000 at her visit on 12/24/2012 in her prednisone total dose was decreased to 40 mg a day. She presents today for reevaluation with a repeat CBC differential.   Patient presented in Dec 2011 with bleeding (vaginal, epistaxis, gums), with hemoglobin 5 and platelets <5K. This was determined to be blood loss anemia as well as AIHA, and ITP. She was diagnosed with lupus during this evaluation. She was treated with steroids and IVIG initially, then eventually resolved the thrombocytopenia with Rituxan. 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  She had acute sinusitis beginning in Dec 2013, treated with at least 3 different full rounds of antibiotics. At regularly scheduled visit at this office 1218-13 platelet count was 78k with Hgb 11.8. She continued with acute sinusitis symptoms at follow up visit 09-30-12, with platelets 58k and Hgb 11. Steroids were begun 10-16-12 with Hgb 10.7 and plt 33k; she did not seem to be hemolyzing then with normal LDH and bilirubin.  By 10-24-12 platelets were 199k and Hgb 10.9. She has had no bleeding or bruising. Sinus symptoms had improved to baseline until ~ 5 days ago when she again has had sinus congestion and drainage with enlarged and sore tonsils and some cough without shortness of breath or fever (on the steroids). She is scheduled to see ENT for  new patient evaluation on 11-11-12.  Her primary complaint continues to be not being able to sleep. This is most likely related to the prednisone.She has had no N/V. She notes feeling "weak" and feels as though her platelet count has dropped. She remained on the 40 mg of prednisone and she was instructed. She did notice "blood blisters" in her mouth. She denied any overt bleeding such as nosebleeds or blood in her urine or stool. Remainder of 10 point Review of Systems negative/ unchanged..  Objective:  Vital signs in last 24 hours:  BP 117/69  Pulse 88  Temp(Src) 98.5 F (36.9 C) (Oral)  Resp 20  Ht 5\' 5"  (1.651 m)  Wt 178 lb 12.8 oz (81.103 kg)  BMI 29.75 kg/m2  LMP 10/24/2012  Weight is up 4.5 lbs since 12/17/2012.  Alert, talkative, appropriate.  HEENT:PERRLA, sclera clear, anicteric and tonsillar tissue bulky with some erythema bilaterally, no exudate. Oral mucosa reveal petechiae with a few tiny blood filled blisters primarily involving the right cheek LymphaticsCervical, supraclavicular, and axillary nodes normal. Resp: clear to auscultation bilaterally and normal percussion bilaterally Cardio: regular rate and rhythm GI: soft, non-tender; bowel sounds normal; no masses,  no organomegaly Extremities: extremities normal, atraumatic, no cyanosis or edema Neuro:nonfocal Skin without rash or ecchymosis  Lab Results:  Results for orders placed in visit on 12/31/12  CBC WITH DIFFERENTIAL      Result Value Range   WBC 7.6  3.9 - 10.3 10e3/uL   NEUT# 4.0  1.5 - 6.5 10e3/uL   HGB 12.7  11.6 -  15.9 g/dL   HCT 16.1  09.6 - 04.5 %   Platelets 7 Platelet count confirmed by slide estimate (*) 145 - 400 10e3/uL   MCV 80.5  79.5 - 101.0 fL   MCH 26.7  25.1 - 34.0 pg   MCHC 33.2  31.5 - 36.0 g/dL   RBC 4.09  8.11 - 9.14 10e6/uL   RDW 17.3 (*) 11.2 - 14.5 %   lymph# 2.8  0.9 - 3.3 10e3/uL   MONO# 0.7  0.1 - 0.9 10e3/uL   Eosinophils Absolute 0.1  0.0 - 0.5 10e3/uL   Basophils  Absolute 0.0  0.0 - 0.1 10e3/uL   NEUT% 52.3  38.4 - 76.8 %   LYMPH% 36.6  14.0 - 49.7 %   MONO% 8.9  0.0 - 14.0 %   EOS% 1.9  0.0 - 7.0 %   BASO% 0.3  0.0 - 2.0 %   nRBC 0  0 - 0 %     Studies/Results:  No results found.  Medications: I have reviewed the patient's current medications. She will increase prednisone back to 80 mg a day at the previous 60 mg at 5:30 in the morning and 20 mg at midday  Patient discussed with Dr. Darrold Span. The patient previously responded well to Rituxan. We will arrange to treat her with Rituxan weekly for 4 consecutive weeks. First scheduled Rituxan will be 01/03/2013 She will followup with Dr. Darrold Span on 01/08/2013 with a repeat CBC differential.  Assessment/Plan: 1.Thrombocytopenia without bleeding, morphology consistent with recurrent ITP, possibly exacerbated by recent respiratory illness(es). Platelets rapidly increased with steroids however they decreased dramatically as we tried to taper the patient off of steroids. Treatment as above to include increase in the steroids back to 80 mg by mouth daily and to begin treatment with Rituxan weekly for 4 weeks. An 2.previous AIHA and iron deficiency anemia: note heavier menses in Dec and hip replacement in Nov.  Iron studies low on 10-24-12 ( iron 22. %sat 7 and ferritin 20). I have encouraged her to continue oral iron and may need to consider IV replacement. 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 Symptoms preceded any steroids for ITP in 2011. Arthritis knees, left knee surgery on hold with present problems  4.no flu vaccine due to all of the respiratory infections 5.hx CIN 1 and 2, followed by gyn  6.long tobacco: previously stopped, then resumed. Has cut back significantly and will try to stop. Hopefully she can obtain nicotine patches as these helped before.  7.systemic lupus: known to Dr Nickola Major 8. Recurrent upper respiratory infections, with bulky tonsils. For ENT evaluation    Follow up as  previously scheduled 12/31/12 with repeat CBC.   Tiana Loft E,PA-C

## 2013-01-03 NOTE — Progress Notes (Signed)
12:15pm  Patient stated, " I'm wheezing more than normal, and my throat is scratchy." Rituxan stopped. Normal saline line infusing. Oxygen at 2L/[place] started. O2 sat is 100%. Dr. Darrold Span made aware and she came to the infusion room. Orders received. Repeat Tylenol 650 mg and Benadryl 25 mg po. Wait 30 minutes before rechallenging Rituxan at the previous dose, which is 200 mg. Continue to monitor. Lenn Sink, RN

## 2013-01-03 NOTE — Patient Instructions (Addendum)
[facility] Discharge Instructions for Patients Receiving Chemotherapy  Today you received the following antibody agent: Rituxan.   You should not experience any adverse event from this treatment at home, but if you develop any of the following call or go to the emergency room.   BELOW ARE SYMPTOMS THAT SHOULD BE REPORTED IMMEDIATELY:  *FEVER GREATER THAN 100.5 F  *CHILLS WITH OR WITHOUT FEVER  NAUSEA AND VOMITING THAT IS NOT CONTROLLED WITH YOUR NAUSEA MEDICATION  *UNUSUAL SHORTNESS OF BREATH  *UNUSUAL BRUISING OR BLEEDING  TENDERNESS IN MOUTH AND THROAT WITH OR WITHOUT PRESENCE OF ULCERS  *URINARY PROBLEMS  *BOWEL PROBLEMS  UNUSUAL RASH Items with * indicate a potential emergency and should be followed up as soon as possible.  One of the nurses will contact you 24 hours after your treatment. Please let the nurse know about any problems that you may have experienced. Feel free to call the clinic you have any questions or concerns. The clinic phone number is [phone].   I have been informed and understand all the instructions given to me. I know to contact the clinic, my physician, or go to the Emergency Department if any problems should occur. I do not have any questions at this time, but understand that I may call the clinic during office hours   should I have any questions or need assistance in obtaining follow up care.    __________________________________________  _____________  __________ Signature of Patient or Authorized Representative            Date                   Time    __________________________________________ Nurse's Signature

## 2013-01-03 NOTE — Progress Notes (Signed)
Medical Oncology  Multiple communications with [facility] financial staff re treating with Rituxan today, with Medicaid. Patient did sign forms for replacement drug. This is urgent situation with platelets <10K from recurrent ITP, in patient who had response only to Rituxan on intital presentation. Per financial staff ok to do treatment today.  Ila Mcgill, MD

## 2013-01-05 ENCOUNTER — Other Ambulatory Visit: Payer: Self-pay | Admitting: Oncology

## 2013-01-07 ENCOUNTER — Telehealth: Payer: Self-pay | Admitting: *Deleted

## 2013-01-07 NOTE — Telephone Encounter (Signed)
Message copied by Augusto Garbe on Tue Jan 07, 2013 12:00 PM ------      Message from: Wandalee Ferdinand      Created: Fri Jan 03, 2013  9:57 AM      Regarding: Chemo Follow Up Call       1st Rituxan on 01/03/13--for ITP--Dr. Darrold Span ------

## 2013-01-07 NOTE — Telephone Encounter (Signed)
Spoke with patient who says she is doing well.  Asked about her breathing and she reports having asthma and "breathing like that all the time".  Assured her the nurses were being safe due to the true allergic reactions we see with swelling of the tongue blocking a patient's airway.  Denies any complications or side effects since receiving this treatment.  Denies questions at this time.  Next f/u tomorrow with MD.

## 2013-01-08 ENCOUNTER — Other Ambulatory Visit: Payer: Medicaid Other | Admitting: Lab

## 2013-01-08 ENCOUNTER — Other Ambulatory Visit (HOSPITAL_BASED_OUTPATIENT_CLINIC_OR_DEPARTMENT_OTHER): Payer: Medicaid Other | Admitting: Lab

## 2013-01-08 ENCOUNTER — Ambulatory Visit: Payer: Medicaid Other | Admitting: Oncology

## 2013-01-08 ENCOUNTER — Encounter: Payer: Self-pay | Admitting: Oncology

## 2013-01-08 ENCOUNTER — Ambulatory Visit (HOSPITAL_BASED_OUTPATIENT_CLINIC_OR_DEPARTMENT_OTHER): Payer: Medicaid Other | Admitting: Oncology

## 2013-01-08 VITALS — BP 125/79 | HR 90 | Temp 97.8°F | Resp 20 | Ht 65.0 in | Wt 184.0 lb

## 2013-01-08 DIAGNOSIS — D693 Immune thrombocytopenic purpura: Secondary | ICD-10-CM

## 2013-01-08 LAB — CBC WITH DIFFERENTIAL/PLATELET
Basophils Absolute: 0 10*3/uL (ref 0.0–0.1)
Eosinophils Absolute: 0.1 10*3/uL (ref 0.0–0.5)
HCT: 36.5 % (ref 34.8–46.6)
HGB: 11.9 g/dL (ref 11.6–15.9)
LYMPH%: 27.1 % (ref 14.0–49.7)
MCV: 82 fL (ref 79.5–101.0)
MONO#: 0.7 10*3/uL (ref 0.1–0.9)
MONO%: 10.4 % (ref 0.0–14.0)
NEUT#: 4 10*3/uL (ref 1.5–6.5)
NEUT%: 60.2 % (ref 38.4–76.8)
Platelets: 49 10*3/uL — ABNORMAL LOW (ref 145–400)
WBC: 6.7 10*3/uL (ref 3.9–10.3)
nRBC: 0 % (ref 0–0)

## 2013-01-08 MED ORDER — PANTOPRAZOLE SODIUM 40 MG PO TBEC: 40.0000 mg | DELAYED_RELEASE_TABLET | Freq: Every morning | ORAL | Status: DC

## 2013-01-08 NOTE — Progress Notes (Signed)
OFFICE PROGRESS NOTE   01/08/2013   Physicians:W.Truslow, E.Avbuerre. 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 She has continued prednisone 20 mg daily as she forgot that she was instructed to stop this on 01-03-13. Throat is better on amoxicillin, which she is taking as directed, and she is also back on plaquenil (which she had not taken as instructed from early Feb until visit back to Dr Kellie Simmering in past week).  She began menses yesterday with heavy bleeding then, no other bleeding. ENT referral is in process by PCP, as required by her insurance.  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 She was treated then with steroids/ IVIG and eventually resolved with Rituxan. 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 She was off treatment until very prolonged sinusitis problems Dec - Jan and with this had drop in platelets consistent with ITP flare. Platelet count improved rapidly with prednisone in late Jan, which was tapered down and off by 11-13-12. I saw her last in office 11-05-12 after which she missed at least 2 appointments with me and we were unable to reach her by phone. Last CBC at my office was 11-12-12 with WBC 5.0, Hgb 11.6 and plt 276. She then presented to [facility] ED on 12-15-12 with 5-7 days of bruising and oral bleeding, with platelets <5k, hgb 12.2 and WBC 5.0. She had not been compliant with Plaquenil by Dr Kellie Simmering for last 3 month prescription. She was begun on steroids with improvement in platelets up to 47k by 12-18-12. She had iron deficiency documented in Feb.and did receive IV feraheme during that hospitalization. She was DC home on 12-18-12 with plans for close follow up at [facility], tho I believe she still missed lab and visit shortly after the DC. CBC on  12-24-12 had WBC 12.8 on steroids, Hgb 11.6 and plt up to 164k. 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 By 12-31-12, platelets were back to 7k, without overt bleeding. With previous good response to Rituxan after inadequate response to initial prednisone and IVIG in 2011, plan now is to treat with Rituxan weekly x 4.   No fever. Continued discomfort right hip: we have discussed AVN concerns as related to steroids, including my conversation with Dr Kellie Simmering. Minimal sinus congestion, no lower respiratory symptoms. Bowels and bladder ok, no symptoms yeast. Appetite great on steroids. No desire to smoke since using nicoderm patches begun in hospital. Chronic GERD symptoms more bothersome recently and will begin regular H2 blocker. Remainder of 10 point Review of Systems negative.  She is to see Dr Francoise Ceo on 01-13-13, last seen ~ 6 mo ago with (?) cryoablation to cervix.  Objective:  Vital signs in last 24 hours:  BP 125/79  Pulse 90  Temp(Src) 97.8 F (36.6 C) (Oral)  Resp 20  Ht 5\' 5"  (1.651 m)  Wt 184 lb (83.462 kg)  BMI 30.62 kg/m2  LMP 10/24/2012 Weight is up 5 lbs with steroids. Alert, appropriate conversation, pleasant and cooperative. Sounds somewhat nasally congested    HEENT:PERRLA and sclera clear, anicteric Oral mucosa moist and clear. Tonsils bulky but less erythema and the ulcerated areas and exudate seen 01-03-13 have resolved. LymphaticsCervical, supraclavicular, and axillary nodes normal. Resp: clear to auscultation bilaterally and normal percussion bilaterally Cardio: regular rate  and rhythm GI: soft, non-tender; bowel sounds normal; no masses,  no organomegaly, spleen not palpable Extremities: extremities normal, atraumatic, no cyanosis or edema Neuro:no sensory deficits noted Breast:normal without suspicious masses, skin or nipple changes or axillary nodes No central catheter Skin without rash, petechiae or ecchymoses  Lab Results:  Results  for orders placed in visit on 01/08/13  CBC WITH DIFFERENTIAL      Result Value Range   WBC 6.7  3.9 - 10.3 10e3/uL   NEUT# 4.0  1.5 - 6.5 10e3/uL   HGB 11.9  11.6 - 15.9 g/dL   HCT 32.4  40.1 - 02.7 %   Platelets 49 (*) 145 - 400 10e3/uL   MCV 82.0  79.5 - 101.0 fL   MCH 26.7  25.1 - 34.0 pg   MCHC 32.6  31.5 - 36.0 g/dL   RBC 2.53  6.64 - 4.03 10e6/uL   RDW 17.6 (*) 11.2 - 14.5 %   lymph# 1.8  0.9 - 3.3 10e3/uL   MONO# 0.7  0.1 - 0.9 10e3/uL   Eosinophils Absolute 0.1  0.0 - 0.5 10e3/uL   Basophils Absolute 0.0  0.0 - 0.1 10e3/uL   NEUT% 60.2  38.4 - 76.8 %   LYMPH% 27.1  14.0 - 49.7 %   MONO% 10.4  0.0 - 14.0 %   EOS% 2.0  0.0 - 7.0 %   BASO% 0.3  0.0 - 2.0 %   nRBC 0  0 - 0 %    Platelets up from 8K on 01-03-13.  Studies/Results:  No results found.  Medications: I have reviewed the patient's current medications. She will stop prednisone, continue amoxicillin every 8 hrs to complete course, continue plaquenil per Dr Kellie Simmering. Will begin protonix or equivalent daily. Continue nicoderm patches.    Assessment/Plan:  1.relapsed ITP: transient improvement with initial prednisone but again dropped as taper begun, better today after first of four planned weekly Rituxan given 01-03-13. Stop prednisone. No apparent hemolysis now. 2.systemic lupus: noncompliant with plaquenil from early Feb until recent problems per Dr Ines Bloomer recent conversation with me. 3. Hx AVN left hip post replacement fall 2013 by Dr Allie Bossier. Similar pain right hip recently. 4.recurrent tonsillitis and respiratory infections: we have not been able to identify ENT physician who saw patient previously (?). Referral in process by PCP now. Back on amoxicillin for symptomatic clinical tonsillar infection again last week, improving. Patient had understood from ? Previous ENT that she needed tonsillectomy 5.long tobacco, now DCd. 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 Follow up with Dr Gaynell Face next week.   She will have second rituxan on 4-25, will see provider 4-30, rituxan on 5-2, I will see her on 5-7 and #4 rituxan 01-24-13.    LIVESAY,LENNIS P, MD   01/08/2013, 10:12 AM

## 2013-01-08 NOTE — Patient Instructions (Addendum)
We will send prescription for reflux medicine to [facility] [address]  Continue plaquenil as instructed by Dr Kellie Simmering. Continue amoxicillin 3x daily.   Stop prednisone  Call Dr Kellie Simmering if the right hip gets worse

## 2013-01-10 ENCOUNTER — Other Ambulatory Visit: Payer: Medicaid Other | Admitting: Lab

## 2013-01-10 ENCOUNTER — Ambulatory Visit: Payer: Medicaid Other

## 2013-01-15 ENCOUNTER — Telehealth: Payer: Self-pay | Admitting: Oncology

## 2013-01-15 ENCOUNTER — Other Ambulatory Visit (HOSPITAL_BASED_OUTPATIENT_CLINIC_OR_DEPARTMENT_OTHER): Payer: Medicaid Other | Admitting: Lab

## 2013-01-15 ENCOUNTER — Ambulatory Visit (HOSPITAL_BASED_OUTPATIENT_CLINIC_OR_DEPARTMENT_OTHER): Payer: Medicaid Other | Admitting: Physician Assistant

## 2013-01-15 VITALS — BP 124/84 | HR 82 | Temp 97.5°F | Resp 18 | Ht 65.0 in | Wt 180.2 lb

## 2013-01-15 DIAGNOSIS — D693 Immune thrombocytopenic purpura: Secondary | ICD-10-CM

## 2013-01-15 LAB — CBC WITH DIFFERENTIAL/PLATELET
BASO%: 0.2 % (ref 0.0–2.0)
Basophils Absolute: 0 10*3/uL (ref 0.0–0.1)
EOS%: 1.1 % (ref 0.0–7.0)
HCT: 38.5 % (ref 34.8–46.6)
HGB: 12.4 g/dL (ref 11.6–15.9)
LYMPH%: 23.2 % (ref 14.0–49.7)
MCH: 27.1 pg (ref 25.1–34.0)
MCHC: 32.2 g/dL (ref 31.5–36.0)
MCV: 84.1 fL (ref 79.5–101.0)
NEUT%: 65.1 % (ref 38.4–76.8)
Platelets: 75 10*3/uL — ABNORMAL LOW (ref 145–400)
lymph#: 1.3 10*3/uL (ref 0.9–3.3)

## 2013-01-15 NOTE — Patient Instructions (Addendum)
Keep scheduled appointments for Rituxan Follow up with Dr. Darrold Span as scheduled on 01/22/2013

## 2013-01-15 NOTE — Telephone Encounter (Signed)
gv and printed pt appt and avs for May

## 2013-01-16 ENCOUNTER — Other Ambulatory Visit: Payer: Self-pay | Admitting: *Deleted

## 2013-01-16 DIAGNOSIS — D693 Immune thrombocytopenic purpura: Secondary | ICD-10-CM

## 2013-01-16 NOTE — Progress Notes (Signed)
OFFICE PROGRESS NOTE   01/15/2013  Physicians:W.Truslow, E.Avbuerre. 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 She he is now totally off of prednisone. She reports that she has been back on her Plaquenil for the past 2 weeks as instructed by Dr. Kellie Simmering. She recently had hernia on her coffee table and did not develop any bruising.  ENT referral is in process by PCP, as required by her insurance.  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 She was treated then with steroids/ IVIG and eventually resolved with Rituxan. 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 She was off treatment until very prolonged sinusitis problems Dec - Jan and with this had drop in platelets consistent with ITP flare. Platelet count improved rapidly with prednisone in late Jan, which was tapered down and off by 11-13-12. I saw her last in office 11-05-12 after which she missed at least 2 appointments with me and we were unable to reach her by phone. Last CBC at my office was 11-12-12 with WBC 5.0, Hgb 11.6 and plt 276. She then presented to [facility] ED on 12-15-12 with 5-7 days of bruising and oral bleeding, with platelets <5k, hgb 12.2 and WBC 5.0. She had not been compliant with Plaquenil by Dr Kellie Simmering for last 3 month prescription. She was begun on steroids with improvement in platelets up to 47k by 12-18-12. She had iron deficiency documented in Feb.and did receive IV feraheme during that hospitalization. She was DC home on 12-18-12 with plans for close follow up at [facility], tho I believe she still missed lab and visit shortly after the DC. CBC on 12-24-12 had WBC 12.8 on steroids, Hgb 11.6 and plt up to 164k. 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 By 12-31-12, platelets were back to  7k, without overt bleeding. With previous good response to Rituxan after inadequate response to initial prednisone and IVIG in 2011, plan now is to treat with Rituxan weekly x 4.   No fever. She continues to have some right hip discomfort. Throat symptoms and sinus congestion are better.  Bowels moving regularly and no bladder symptomatology. Appetite remains good even off steroids.  Chronic GERD symptoms more bothersome recently better with H2 blocker. Remainder of 10 point Review of Systems negative.    Objective:  Vital signs in last 24 hours:  BP 124/84  Pulse 82  Temp(Src) 97.5 F (36.4 C) (Oral)  Resp 18  Ht 5\' 5"  (1.651 m)  Wt 180 lb 3.2 oz (81.738 kg)  BMI 29.99 kg/m2 Weight is up 5 lbs with steroids. Alert, appropriate conversation, pleasant and cooperative. Sounds somewhat nasally congested    HEENT:PERRLA and sclera clear, anicteric Oral mucosa moist and clear. Tonsils bulky but without erythema or exudate  LymphaticsCervical, supraclavicular, and axillary nodes normal. Resp: clear to auscultation bilaterally and normal percussion bilaterally Cardio: regular rate and rhythm GI: soft, non-tender; bowel sounds normal; no masses,  no organomegaly, spleen not palpable Extremities: extremities normal, atraumatic, no cyanosis or edema Neuro:no sensory deficits noted Breast:normal without suspicious masses, skin or nipple changes or axillary nodes No central catheter Skin without rash, petechiae or ecchymoses  Lab Results:  Results for orders placed in visit on 01/15/13  CBC WITH DIFFERENTIAL      Result Value Range  WBC 5.6  3.9 - 10.3 10e3/uL   NEUT# 3.7  1.5 - 6.5 10e3/uL   HGB 12.4  11.6 - 15.9 g/dL   HCT 47.8  29.5 - 62.1 %   Platelets 75 (*) 145 - 400 10e3/uL   MCV 84.1  79.5 - 101.0 fL   MCH 27.1  25.1 - 34.0 pg   MCHC 32.2  31.5 - 36.0 g/dL   RBC 3.08  6.57 - 8.46 10e6/uL   RDW 17.1 (*) 11.2 - 14.5 %   lymph# 1.3  0.9 - 3.3 10e3/uL   MONO# 0.6  0.1 - 0.9  10e3/uL   Eosinophils Absolute 0.1  0.0 - 0.5 10e3/uL   Basophils Absolute 0.0  0.0 - 0.1 10e3/uL   NEUT% 65.1  38.4 - 76.8 %   LYMPH% 23.2  14.0 - 49.7 %   MONO% 10.4  0.0 - 14.0 %   EOS% 1.1  0.0 - 7.0 %   BASO% 0.2  0.0 - 2.0 %    Platelets up from 8K on 01-03-13 to 75,000 today Studies/Results:  No results found.  Medications: I have reviewed the patient's current medications.   Patient discussed with Dr. Darrold Span. She will proceed with cycle #3 of her Rituxan as scheduled on 01/17/2013. She'll followup with Dr. Darrold Span on 01/22/2013 as previously scheduled and will proceed with cycle #4 of her Rituxan on 01/24/2013.  Assessment/Plan:  1.relapsed ITP: transient improvement with initial prednisone but again dropped as taper begun, better today after two of four planned weekly Rituxan given 01-10-13. Prednisone has been discontinued. Platelet count rising nicely now. No current problems with bleeding or bruising No apparent hemolysis now. 2.systemic lupus: noncompliant with plaquenil from early Feb until recent problems per Dr Ines Bloomer recent conversation with me. 3. Hx AVN left hip post replacement fall 2013 by Dr Allie Bossier. Similar pain right hip recently. 4.recurrent tonsillitis and respiratory infections: we have not been able to identify ENT physician who saw patient previously (?). Referral in process by PCP now. Course of amoxicillin completed. Patient to see ENT specialist on 01/21/2013, she is expecting she will require a tonsillectomy.  5.long tobacco, now DCd. 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 Follow up with Dr Gaynell Face next week.       Laural Benes, Marrah Vanevery E, PA-C

## 2013-01-17 ENCOUNTER — Ambulatory Visit (HOSPITAL_BASED_OUTPATIENT_CLINIC_OR_DEPARTMENT_OTHER): Payer: Medicaid Other

## 2013-01-17 ENCOUNTER — Other Ambulatory Visit (HOSPITAL_BASED_OUTPATIENT_CLINIC_OR_DEPARTMENT_OTHER): Payer: Medicaid Other | Admitting: Lab

## 2013-01-17 ENCOUNTER — Other Ambulatory Visit: Payer: Self-pay | Admitting: Oncology

## 2013-01-17 VITALS — BP 129/82 | HR 89 | Temp 97.8°F | Resp 20

## 2013-01-17 DIAGNOSIS — Z5112 Encounter for antineoplastic immunotherapy: Secondary | ICD-10-CM

## 2013-01-17 DIAGNOSIS — D693 Immune thrombocytopenic purpura: Secondary | ICD-10-CM

## 2013-01-17 LAB — CBC WITH DIFFERENTIAL/PLATELET
BASO%: 0.2 % (ref 0.0–2.0)
Basophils Absolute: 0 10*3/uL (ref 0.0–0.1)
EOS%: 1.8 % (ref 0.0–7.0)
HGB: 13.7 g/dL (ref 11.6–15.9)
MCH: 26.9 pg (ref 25.1–34.0)
MCHC: 32.5 g/dL (ref 31.5–36.0)
MCV: 82.7 fL (ref 79.5–101.0)
MONO%: 9.5 % (ref 0.0–14.0)
NEUT%: 56.1 % (ref 38.4–76.8)
RDW: 16.5 % — ABNORMAL HIGH (ref 11.2–14.5)

## 2013-01-17 MED ORDER — ACETAMINOPHEN 325 MG PO TABS
650.0000 mg | ORAL_TABLET | Freq: Once | ORAL | Status: AC
  Administered 2013-01-17: 650 mg via ORAL

## 2013-01-17 MED ORDER — SODIUM CHLORIDE 0.9 % IV SOLN
Freq: Once | INTRAVENOUS | Status: AC
  Administered 2013-01-17: 10:00:00 via INTRAVENOUS

## 2013-01-17 MED ORDER — DIPHENHYDRAMINE HCL 25 MG PO CAPS
50.0000 mg | ORAL_CAPSULE | Freq: Once | ORAL | Status: AC
  Administered 2013-01-17: 50 mg via ORAL

## 2013-01-17 MED ORDER — SODIUM CHLORIDE 0.9 % IV SOLN
375.0000 mg/m2 | Freq: Once | INTRAVENOUS | Status: AC
  Administered 2013-01-17: 700 mg via INTRAVENOUS
  Filled 2013-01-17: qty 70

## 2013-01-17 NOTE — Progress Notes (Signed)
q 

## 2013-01-17 NOTE — Patient Instructions (Signed)
[facility] Discharge Instructions for Patients Receiving Chemotherapy  Today you received the following chemotherapy agents Rituxan.  To help prevent nausea and vomiting after your treatment, we encourage you to take your nausea medication as prescribed.    If you develop nausea and vomiting that is not controlled by your nausea medication, call the clinic. If it is after clinic hours your family physician or the after hours number for the clinic or go to the Emergency Department.   BELOW ARE SYMPTOMS THAT SHOULD BE REPORTED IMMEDIATELY:  *FEVER GREATER THAN 100.5 F  *CHILLS WITH OR WITHOUT FEVER  NAUSEA AND VOMITING THAT IS NOT CONTROLLED WITH YOUR NAUSEA MEDICATION  *UNUSUAL SHORTNESS OF BREATH  *UNUSUAL BRUISING OR BLEEDING  TENDERNESS IN MOUTH AND THROAT WITH OR WITHOUT PRESENCE OF ULCERS  *URINARY PROBLEMS  *BOWEL PROBLEMS  UNUSUAL RASH   Please let the nurse know about any problems that you may have experienced. Feel free to call the clinic you have any questions or concerns. The clinic phone number is [phone].   I have been informed and understand all the instructions given to me. I know to contact the clinic, my physician, or go to the Emergency Department if any problems should occur. I do not have any questions at this time, but understand that I may call the clinic during office hours   should I have any questions or need assistance in obtaining follow up care.    __________________________________________  _____________  __________ Signature of Patient or Authorized Representative            Date                   Time    __________________________________________ Nurse's Signature

## 2013-01-17 NOTE — Progress Notes (Signed)
Per Dr. Darrold Span, OK to treat despite platelet count of 88 today.

## 2013-01-22 ENCOUNTER — Other Ambulatory Visit (HOSPITAL_BASED_OUTPATIENT_CLINIC_OR_DEPARTMENT_OTHER): Payer: Medicaid Other | Admitting: Lab

## 2013-01-22 ENCOUNTER — Other Ambulatory Visit: Payer: Medicaid Other | Admitting: Lab

## 2013-01-22 ENCOUNTER — Telehealth: Payer: Self-pay | Admitting: Oncology

## 2013-01-22 ENCOUNTER — Ambulatory Visit: Payer: Medicaid Other | Admitting: Oncology

## 2013-01-22 ENCOUNTER — Ambulatory Visit (HOSPITAL_BASED_OUTPATIENT_CLINIC_OR_DEPARTMENT_OTHER): Payer: Medicaid Other | Admitting: Physician Assistant

## 2013-01-22 ENCOUNTER — Encounter: Payer: Self-pay | Admitting: Physician Assistant

## 2013-01-22 VITALS — BP 97/75 | HR 111 | Temp 97.9°F | Resp 18 | Ht 65.0 in | Wt 173.7 lb

## 2013-01-22 DIAGNOSIS — D693 Immune thrombocytopenic purpura: Secondary | ICD-10-CM

## 2013-01-22 LAB — CBC WITH DIFFERENTIAL/PLATELET
BASO%: 0.6 % (ref 0.0–2.0)
EOS%: 1.3 % (ref 0.0–7.0)
LYMPH%: 38.4 % (ref 14.0–49.7)
MCHC: 33.5 g/dL (ref 31.5–36.0)
MCV: 81.2 fL (ref 79.5–101.0)
MONO%: 10.4 % (ref 0.0–14.0)
Platelets: 35 10*3/uL — ABNORMAL LOW (ref 145–400)
RBC: 5.22 10*6/uL (ref 3.70–5.45)
RDW: 15.9 % — ABNORMAL HIGH (ref 11.2–14.5)
nRBC: 0 % (ref 0–0)

## 2013-01-22 NOTE — Patient Instructions (Addendum)
Return on Friday, May 9 4 lab and your next Rituxan Followup on Wednesday, 01/29/2013 for another symptom management visit prior to your next scheduled cycle of Rituxan on 01/31/2013 Followup with Dr. Melvyn Neth A. on Feb 05 2013 for another symptom management visit

## 2013-01-22 NOTE — Telephone Encounter (Signed)
poer Sallye Ober the desk nurse pt needed to be r/s today with Forde Radon

## 2013-01-23 NOTE — Progress Notes (Signed)
OFFICE PROGRESS NOTE   01/15/2013  Physicians:W.Truslow, E.Avbuerre. 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 She he is now totally off of prednisone. She reports that she has been back on her Plaquenil for the past 2 weeks as instructed by Dr. Kellie Simmering. She recently hit her knee on her coffee table and did not develop any bruising.  She was seen by the ear nose and throat specialist, Dr. Jearld Fenton, earlier this morning and states that indeed her tonsils do need to be removed. This procedure we'll have to wait until her platelet issue has been proved/resolved.   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 She was treated then with steroids/ IVIG and eventually resolved with Rituxan. 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 She was off treatment until very prolonged sinusitis problems Dec - Jan and with this had drop in platelets consistent with ITP flare. Platelet count improved rapidly with prednisone in late Jan, which was tapered down and off by 11-13-12. I saw her last in office 11-05-12 after which she missed at least 2 appointments with me and we were unable to reach her by phone. Last CBC at my office was 11-12-12 with WBC 5.0, Hgb 11.6 and plt 276. She then presented to [facility] ED on 12-15-12 with 5-7 days of bruising and oral bleeding, with platelets <5k, hgb 12.2 and WBC 5.0. She had not been compliant with Plaquenil by Dr Kellie Simmering for last 3 month prescription. She was begun on steroids with improvement in platelets up to 47k by 12-18-12. She had iron deficiency documented in Feb.and did receive IV feraheme during that hospitalization. She was DC home on 12-18-12 with plans for close follow up at [facility], tho I believe she still missed lab and visit shortly after the  DC. CBC on 12-24-12 had WBC 12.8 on steroids, Hgb 11.6 and plt up to 164k. 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 By 12-31-12, platelets were back to 7k, without overt bleeding. With previous good response to Rituxan after inadequate response to initial prednisone and IVIG in 2011, plan now is to treat with Rituxan weekly x 4.   She presents today accompanied by her brother. She complains of some bruising on the back of her arms as well as some soreness of her gums. She did note some blood on her pillow cases in the morning. She did not have any issues with blood in her urine or stool. She does note some intermittent and sharp pain in the left side of her head that lasted approximately 5 minutes. Is not associated with any blurred or double vision, nausea or vomiting. Upon extensive questioning of over-the-counter medications patient has been taking, she did admit to taking a lot of Alka Seltzer in the past 3 weeks. She did however deny taking any aspirin, NSAIDs, BC powders or Goody powders. Denied fever. She continues to have some right hip discomfort.   Bowels moving regularly and no bladder symptomatology. Appetite remains good even off steroids.  Chronic GERD symptoms better with H2 blocker. Remainder of 10 point Review of Systems negative.    Objective:  Vital signs in last 24 hours:  BP 97/75  Pulse 111  Temp(Src) 97.9 F (36.6 C) (Oral)  Resp 18  Ht 5\' 5"  (1.651 m)  Wt 173 lb 11.2 oz (78.79 kg)  BMI 28.91 kg/m2 Weight is up 5 lbs with steroids. Alert, appropriate conversation, pleasant and cooperative.    HEENT:PERRLA and sclera clear, anicteric Oral mucosa moist and clear. Tonsils bulky but without erythema or exudate  LymphaticsCervical, supraclavicular, and axillary nodes normal. Resp: clear to auscultation bilaterally and normal percussion bilaterally Cardio: regular rate and rhythm GI: soft, non-tender; bowel sounds normal; no masses,  no organomegaly, spleen not  palpable Extremities: extremities normal, atraumatic, no cyanosis or edema Neuro:no sensory deficits noted Breast:normal without suspicious masses, skin or nipple changes or axillary nodes No central catheter Skin  no petechiae but there are areas of ecchymoses on the triceps areas bilaterally  Lab Results:  Results for orders placed in visit on 01/22/13  CBC WITH DIFFERENTIAL      Result Value Range   WBC 5.2  3.9 - 10.3 10e3/uL   NEUT# 2.6  1.5 - 6.5 10e3/uL   HGB 14.2  11.6 - 15.9 g/dL   HCT 40.9  81.1 - 91.4 %   Platelets 35 (*) 145 - 400 10e3/uL   MCV 81.2  79.5 - 101.0 fL   MCH 27.2  25.1 - 34.0 pg   MCHC 33.5  31.5 - 36.0 g/dL   RBC 7.82  9.56 - 2.13 10e6/uL   RDW 15.9 (*) 11.2 - 14.5 %   lymph# 2.0  0.9 - 3.3 10e3/uL   MONO# 0.5  0.1 - 0.9 10e3/uL   Eosinophils Absolute 0.1  0.0 - 0.5 10e3/uL   Basophils Absolute 0.0  0.0 - 0.1 10e3/uL   NEUT% 49.3  38.4 - 76.8 %   LYMPH% 38.4  14.0 - 49.7 %   MONO% 10.4  0.0 - 14.0 %   EOS% 1.3  0.0 - 7.0 %   BASO% 0.6  0.0 - 2.0 %   nRBC 0  0 - 0 %    Platelets down from 88 K on 01-17-13 to 35,000 today Studies/Results:  No results found.  Medications: I have reviewed the patient's current medications.   Patient discussed with Dr. Darrold Span. I suspect part of her decrease in her platelet count is related to the ingestion of the [employer], as this does contain a fair amount of aspirin. For some reason she did not receive cycle #3 of her Rituxan as scheduled on 01/17/2013. She will proceed with cycle #3 of her Rituxan today. She'll receive another symptom management visit on Jan 29 2013 with repeat CBC differential at that visit and cycle #4 of her Rituxan on 01/31/2013 with another CBC differential. She'll followup with Dr. Darrold Span for symptom management visit on 02/05/2013.   Patient was advised to discontinue all aspirin-containing products or any similar products including Alka-Seltzer. She voiced understanding. She'll followup  with her primary care physician regarding her head pains.  Assessment/Plan:  1.relapsed ITP: transient improvement with initial prednisone but again dropped as taper begun, better today after two of four planned weekly Rituxan given 01-10-13. Prednisone has been discontinued. Platelet count rising nicely now. No current problems with bleeding and only mild bruising No apparent hemolysis now. 2.systemic lupus: noncompliant with plaquenil from early Feb until recent problems per Dr Ines Bloomer recent conversation with me. 3. Hx AVN left hip post replacement fall 2013 by Dr Allie Bossier. Similar pain right hip recently. 4.recurrent tonsillitis and respiratory infections: Patient seen by Dr. Jearld Fenton, ENT, earlier this morning,she will require a tonsillectomy. This will have to be scheduled after her platelets have sufficiently  recovered. 5.long tobacco, now DCd. 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 Follow up with Dr Gaynell Face next week.       Laural Benes, Jahmier Willadsen E, PA-C

## 2013-01-24 ENCOUNTER — Other Ambulatory Visit (HOSPITAL_BASED_OUTPATIENT_CLINIC_OR_DEPARTMENT_OTHER): Payer: Medicaid Other | Admitting: Lab

## 2013-01-24 ENCOUNTER — Ambulatory Visit (HOSPITAL_BASED_OUTPATIENT_CLINIC_OR_DEPARTMENT_OTHER): Payer: Medicaid Other

## 2013-01-24 VITALS — BP 123/78 | HR 82 | Temp 98.3°F | Resp 18

## 2013-01-24 DIAGNOSIS — Z5112 Encounter for antineoplastic immunotherapy: Secondary | ICD-10-CM

## 2013-01-24 DIAGNOSIS — D693 Immune thrombocytopenic purpura: Secondary | ICD-10-CM

## 2013-01-24 LAB — CBC WITH DIFFERENTIAL/PLATELET
BASO%: 0.5 % (ref 0.0–2.0)
EOS%: 1.5 % (ref 0.0–7.0)
MCH: 27.6 pg (ref 25.1–34.0)
MCHC: 33.8 g/dL (ref 31.5–36.0)
NEUT%: 46 % (ref 38.4–76.8)
RBC: 4.78 10*6/uL (ref 3.70–5.45)
RDW: 15.8 % — ABNORMAL HIGH (ref 11.2–14.5)
lymph#: 1.6 10*3/uL (ref 0.9–3.3)
nRBC: 0 % (ref 0–0)

## 2013-01-24 MED ORDER — SODIUM CHLORIDE 0.9 % IV SOLN
375.0000 mg/m2 | Freq: Once | INTRAVENOUS | Status: AC
  Administered 2013-01-24: 700 mg via INTRAVENOUS
  Filled 2013-01-24: qty 70

## 2013-01-24 MED ORDER — ACETAMINOPHEN 325 MG PO TABS
650.0000 mg | ORAL_TABLET | Freq: Once | ORAL | Status: AC
  Administered 2013-01-24: 650 mg via ORAL

## 2013-01-24 MED ORDER — SODIUM CHLORIDE 0.9 % IV SOLN
Freq: Once | INTRAVENOUS | Status: AC
  Administered 2013-01-24: 10:00:00 via INTRAVENOUS

## 2013-01-24 MED ORDER — DIPHENHYDRAMINE HCL 25 MG PO CAPS
50.0000 mg | ORAL_CAPSULE | Freq: Once | ORAL | Status: AC
Start: 2013-01-24 — End: 2013-01-24
  Administered 2013-01-24: 50 mg via ORAL

## 2013-01-24 NOTE — Patient Instructions (Addendum)
[facility] Discharge Instructions for Patients Receiving Chemotherapy  Today you received the following chemotherapy agents Rituxan.  To help prevent nausea and vomiting after your treatment, we encourage you to take your nausea medication as prescribed.    If you develop nausea and vomiting that is not controlled by your nausea medication, call the clinic. If it is after clinic hours your family physician or the after hours number for the clinic or go to the Emergency Department.   BELOW ARE SYMPTOMS THAT SHOULD BE REPORTED IMMEDIATELY:  *FEVER GREATER THAN 100.5 F  *CHILLS WITH OR WITHOUT FEVER  NAUSEA AND VOMITING THAT IS NOT CONTROLLED WITH YOUR NAUSEA MEDICATION  *UNUSUAL SHORTNESS OF BREATH  *UNUSUAL BRUISING OR BLEEDING  TENDERNESS IN MOUTH AND THROAT WITH OR WITHOUT PRESENCE OF ULCERS  *URINARY PROBLEMS  *BOWEL PROBLEMS  UNUSUAL RASH Items with * indicate a potential emergency and should be followed up as soon as possible.  Please let the nurse know about any problems that you may have experienced. Feel free to call the clinic you have any questions or concerns. The clinic phone number is [phone].   I have been informed and understand all the instructions given to me. I know to contact the clinic, my physician, or go to the Emergency Department if any problems should occur. I do not have any questions at this time, but understand that I may call the clinic during office hours   should I have any questions or need assistance in obtaining follow up care.    __________________________________________  _____________  __________ Signature of Patient or Authorized Representative            Date                   Time    __________________________________________ Nurse's Signature    

## 2013-01-24 NOTE — Progress Notes (Signed)
z

## 2013-01-29 ENCOUNTER — Ambulatory Visit: Payer: Medicaid Other | Admitting: Physician Assistant

## 2013-01-29 ENCOUNTER — Other Ambulatory Visit: Payer: Medicaid Other | Admitting: Lab

## 2013-01-29 ENCOUNTER — Telehealth: Payer: Self-pay | Admitting: Oncology

## 2013-01-29 NOTE — Telephone Encounter (Signed)
returned pt msg and lvm for pt regarding to 5.16.14 appts

## 2013-01-31 ENCOUNTER — Other Ambulatory Visit: Payer: Medicaid Other | Admitting: Lab

## 2013-01-31 ENCOUNTER — Other Ambulatory Visit: Payer: Self-pay | Admitting: Physician Assistant

## 2013-01-31 ENCOUNTER — Ambulatory Visit: Payer: Medicaid Other

## 2013-01-31 ENCOUNTER — Other Ambulatory Visit: Payer: Medicaid Other

## 2013-01-31 NOTE — Progress Notes (Signed)
Pt did not show up for her Rituxan treatment today . She is scheduled to see Dr. Darrold Span on 02/05/13. This message routed to Dr Darrold Span.

## 2013-02-05 ENCOUNTER — Ambulatory Visit: Payer: Medicaid Other | Admitting: Oncology

## 2013-02-05 ENCOUNTER — Other Ambulatory Visit: Payer: Medicaid Other | Admitting: Lab

## 2013-02-05 NOTE — Progress Notes (Signed)
Pt. [facility] appointment today, treatment 01-31-13, and f/u visit with PA-C Tiana Loft on 01-29-13.

## 2013-02-11 ENCOUNTER — Telehealth: Payer: Self-pay | Admitting: Oncology

## 2013-02-11 ENCOUNTER — Telehealth: Payer: Self-pay | Admitting: *Deleted

## 2013-02-11 NOTE — Telephone Encounter (Signed)
gv and printed appt sched and avs for pt  °

## 2013-02-11 NOTE — Telephone Encounter (Signed)
Pt rescheduled appointment with Dr Darrold Span and labs on 02/18/13

## 2013-02-13 ENCOUNTER — Other Ambulatory Visit: Payer: Self-pay | Admitting: Rheumatology

## 2013-02-13 ENCOUNTER — Ambulatory Visit
Admission: RE | Admit: 2013-02-13 | Discharge: 2013-02-13 | Disposition: A | Discharge status: Home / Self Care | Payer: Medicaid Other | Source: Ambulatory Visit | Attending: Rheumatology | Admitting: Rheumatology

## 2013-02-13 DIAGNOSIS — R1031 Right lower quadrant pain: Secondary | ICD-10-CM

## 2013-02-18 ENCOUNTER — Ambulatory Visit: Payer: Medicaid Other | Admitting: Oncology

## 2013-02-18 ENCOUNTER — Other Ambulatory Visit: Payer: Medicaid Other | Admitting: Lab

## 2013-02-18 NOTE — Progress Notes (Unsigned)
Ms. Darrick Penna did not show today for her third consecutive  doctor's visit.  This visit was rescheduled with patient over the phone on 02-11-13. She missed follow up appointments on  01-22-13;02-05-13; 02-18-13  Ms. Fields also missed an appointment on 01-31-13 for Rituxan treatment.

## 2013-02-24 ENCOUNTER — Telehealth: Payer: Self-pay | Admitting: Oncology

## 2013-02-24 NOTE — Telephone Encounter (Signed)
pt called to r/s missed appt....Done °

## 2013-03-03 ENCOUNTER — Other Ambulatory Visit: Payer: Self-pay

## 2013-03-03 ENCOUNTER — Ambulatory Visit: Payer: Self-pay | Admitting: Oncology

## 2013-03-03 ENCOUNTER — Other Ambulatory Visit: Payer: Medicaid Other | Admitting: Lab

## 2013-03-12 ENCOUNTER — Encounter: Payer: Self-pay | Admitting: Oncology

## 2013-03-12 NOTE — Progress Notes (Signed)
Medical Oncology   Note of Information  Patient has missed MD appointments on 01-22-13, 02-05-13, 02-18-13 and 03-03-13 as well as missing Rituxan treatment on 01-31-13.  Dana Mcgill, MD

## 2013-03-24 ENCOUNTER — Telehealth: Payer: Self-pay | Admitting: Oncology

## 2013-03-24 NOTE — Telephone Encounter (Signed)
pt called to r/s missed appt....Done °

## 2013-04-10 ENCOUNTER — Telehealth: Payer: Self-pay | Admitting: *Deleted

## 2013-04-10 NOTE — Telephone Encounter (Signed)
I have called and left voicemail message for the patient to call the office. Need to move appts from [phone] to [phone].  Dana Kennedy

## 2013-04-11 ENCOUNTER — Ambulatory Visit: Payer: Self-pay

## 2013-04-11 ENCOUNTER — Other Ambulatory Visit: Payer: Self-pay | Admitting: Lab

## 2013-04-11 NOTE — Progress Notes (Signed)
Ms. Darrick Penna did not show today for MD follow up.  She has consecutively missed several MD appointments in the last ~months including a Rituxan treatment. Information given to Medical director Jule Ser.  A letter will be sent to patient expressing concern for follow up care.

## 2013-04-14 ENCOUNTER — Telehealth: Payer: Self-pay | Admitting: Oncology

## 2013-04-14 NOTE — Telephone Encounter (Signed)
pt called to r/s missed appt....Done °

## 2013-04-15 ENCOUNTER — Other Ambulatory Visit (HOSPITAL_BASED_OUTPATIENT_CLINIC_OR_DEPARTMENT_OTHER): Payer: Medicaid Other | Admitting: Lab

## 2013-04-15 ENCOUNTER — Ambulatory Visit (HOSPITAL_BASED_OUTPATIENT_CLINIC_OR_DEPARTMENT_OTHER): Payer: Medicaid Other | Admitting: Hematology and Oncology

## 2013-04-15 ENCOUNTER — Telehealth: Payer: Self-pay | Admitting: Hematology and Oncology

## 2013-04-15 VITALS — BP 123/85 | HR 78 | Temp 96.8°F | Resp 19 | Ht 65.0 in | Wt 186.1 lb

## 2013-04-15 DIAGNOSIS — D693 Immune thrombocytopenic purpura: Secondary | ICD-10-CM

## 2013-04-15 LAB — CBC WITH DIFFERENTIAL/PLATELET
Eosinophils Absolute: 0.1 10*3/uL (ref 0.0–0.5)
MCV: 85.3 fL (ref 79.5–101.0)
MONO%: 12.9 % (ref 0.0–14.0)
NEUT#: 2.4 10*3/uL (ref 1.5–6.5)
RBC: 4.8 10*6/uL (ref 3.70–5.45)
RDW: 13.6 % (ref 11.2–14.5)
WBC: 5.4 10*3/uL (ref 3.9–10.3)

## 2013-04-15 NOTE — Progress Notes (Signed)
OFFICE PROGRESS NOTE   01/15/2013  Physicians:W.Truslow, E.Avbuerre. 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 She he is now totally off of prednisone. She reports that she has been back on her Plaquenil for the past 2 weeks as instructed by Dr. Kellie Simmering. She recently hit her knee on her coffee table and did not develop any bruising.  She was seen by the ear nose and throat specialist, Dr. Jearld Fenton, earlier this morning and states that indeed her tonsils do need to be removed. This procedure we'll have to wait until her platelet issue has been proved/resolved.   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 She was treated then with steroids/ IVIG and eventually resolved with Rituxan. 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 She was off treatment until very prolonged sinusitis problems Dec - Jan and with this had drop in platelets consistent with ITP flare. Platelet count improved rapidly with prednisone in late Jan, which was tapered down and off by 11-13-12. I saw her last in office 11-05-12 after which she missed at least 2 appointments with me and we were unable to reach her by phone. Last CBC at my office was 11-12-12 with WBC 5.0, Hgb 11.6 and plt 276. She then presented to [facility] ED on 12-15-12 with 5-7 days of bruising and oral bleeding, with platelets <5k, hgb 12.2 and WBC 5.0. She had not been compliant with Plaquenil by Dr Kellie Simmering for last 3 month prescription. She was begun on steroids with improvement in platelets up to 47k by 12-18-12. She had iron deficiency documented in Feb.and did receive IV feraheme during that hospitalization. She was DC home on 12-18-12 with plans for close follow up at [facility], tho I believe she still missed lab and visit shortly after the  DC. CBC on 12-24-12 had WBC 12.8 on steroids, Hgb 11.6 and plt up to 164k. 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 By 12-31-12, platelets were back to 7k, without overt bleeding. With previous good response to Rituxan after inadequate response to initial prednisone and IVIG in 2011, plan now is to treat with Rituxan weekly x 4.   She presents today accompanied by her brother. No bleeding episodes.  She did not have any issues with blood in her urine or stool. Denied nausea or vomiting. Denied fever. She continues to have some right hip discomfort.   Bowels moving regularly and no bladder symptomatology. Appetite remains good even off steroids.  Chronic GERD symptoms better with H2 blocker. Remainder of 10 point Review of Systems negative.    Objective:  Vital signs in last 24 hours:  BP 123/85  Pulse 78  Temp(Src) 96.8 F (36 C) (Oral)  Resp 19  Ht 5\' 5"  (1.651 m)  Wt 186 lb 1.6 oz (84.414 kg)  BMI 30.97 kg/m2 Weight is up 5 lbs with steroids. Alert, appropriate conversation, pleasant and cooperative.    HEENT:PERRLA and sclera clear, anicteric Oral mucosa moist and clear. Tonsils bulky but without erythema or exudate  LymphaticsCervical, supraclavicular, and axillary nodes normal. Resp: clear to auscultation bilaterally and normal percussion bilaterally Cardio: regular rate and rhythm GI: soft, non-tender; bowel sounds normal; no masses,  no organomegaly, spleen not palpable Extremities: extremities normal, atraumatic, no cyanosis or edema Neuro:no sensory deficits noted Breast:normal  without suspicious masses, skin or nipple changes or axillary nodes No central catheter Skin  no petechiae but there are areas of ecchymoses on the triceps areas bilaterally  Lab Results:  Results for orders placed in visit on 04/15/13  CBC WITH DIFFERENTIAL      Result Value Range   WBC 5.4  3.9 - 10.3 10e3/uL   NEUT# 2.4  1.5 - 6.5 10e3/uL   HGB 13.6  11.6 - 15.9 g/dL   HCT 16.1   09.6 - 04.5 %   Platelets 302  145 - 400 10e3/uL   MCV 85.3  79.5 - 101.0 fL   MCH 28.4  25.1 - 34.0 pg   MCHC 33.3  31.5 - 36.0 g/dL   RBC 4.09  8.11 - 9.14 10e6/uL   RDW 13.6  11.2 - 14.5 %   lymph# 2.1  0.9 - 3.3 10e3/uL   MONO# 0.7  0.1 - 0.9 10e3/uL   Eosinophils Absolute 0.1  0.0 - 0.5 10e3/uL   Basophils Absolute 0.0  0.0 - 0.1 10e3/uL   NEUT% 44.3  38.4 - 76.8 %   LYMPH% 39.5  14.0 - 49.7 %   MONO% 12.9  0.0 - 14.0 %   EOS% 2.6  0.0 - 7.0 %   BASO% 0.7  0.0 - 2.0 %    Platelets down from 88 K on 01-17-13 to 35,000 today Studies/Results:  No results found.  Medications: I have reviewed the patient's current medications.   Patient discussed with Dr. Darrold Span. I suspect part of her decrease in her platelet count is related to the ingestion of the [employer], as this does contain a fair amount of aspirin. For some reason she did not receive cycle #3 of her Rituxan as scheduled on 01/17/2013. She will proceed with cycle #3 of her Rituxan today. She'll receive another symptom management visit on Jan 29 2013 with repeat CBC differential at that visit and cycle #4 of her Rituxan on 01/31/2013 with another CBC differential. She'll followup with Dr. Darrold Span for symptom management visit on 02/05/2013.   Patient was advised to discontinue all aspirin-containing products or any similar products including Alka-Seltzer. She voiced understanding. She'll followup with her primary care physician regarding her head pains.  Assessment/Plan:  1.relapsed ITP: Patient doesn't show up to her clinic appointment neither treatment appointment most of the times,  but her Plts count is 302k today. Last Rituxan given on 01/24/2013. Prednisone has been discontinued. Platelet count rising nicely now. No current problems with bleeding and only mild bruising. Will return to clinic in 4 weeks with CBC. Will resume Rituxan if needed. 2.systemic lupus: noncompliant with plaquenil from early Feb until recent problems  per Dr Ines Bloomer recent conversation with me. 3. Hx AVN left hip post replacement fall 2013 by Dr Allie Bossier. Similar pain right hip recently. 4.recurrent tonsillitis and respiratory infections: Patient seen by Dr. Jearld Fenton, ENT, earlier this morning,she will require a tonsillectomy. This will have to be scheduled after her platelets have sufficiently recovered. 5.long tobacco, now DCd. Continuing nicoderm patches 14 mg daily 6.iron deficiency anemia Feb 2014 post IV feraheme 12-17-12  Zachery Dakins, MD 04/15/2013 11:27 AM

## 2013-04-15 NOTE — Telephone Encounter (Signed)
gv and printed appt sched and avs for pt  °

## 2013-04-17 ENCOUNTER — Encounter: Payer: Self-pay | Admitting: Hematology and Oncology

## 2013-04-17 NOTE — Progress Notes (Signed)
Per Dawn give patient a call for possible asst. I called and left a message at [phone].

## 2013-04-21 ENCOUNTER — Encounter: Payer: Self-pay | Admitting: Hematology and Oncology

## 2013-04-21 NOTE — Progress Notes (Signed)
Patient had left a message wanting assistance. Her power was turned off. She said to get back on she must pay 423.00. She is not working- her dad helps with her bills and she has applied for disability. She will bring letter of support from dad, letter about her getting disability and a note she will pay the 23.00(difference from bill and [facility] grant). I advised her she must call and let them know we will pay, but could be about 3weeks before they will get check.

## 2013-04-23 ENCOUNTER — Encounter: Payer: Self-pay | Admitting: Hematology and Oncology

## 2013-04-23 NOTE — Progress Notes (Signed)
Patient had letter from dad--he helps with bills. She had [employer] bill to pay 400.00 she will pay the difference 44.59 to get her power back on. I advised her of time for processing of check. She letter of one time grant that is now 0.00 balance. She also bought in copies of her proof of trying to get disability.

## 2013-04-23 NOTE — Progress Notes (Signed)
Disability filed and case mgr giselle lopez ext [phone]- it will take 3-6 months    Case [id number]  [phone] DDS in [place], [place]

## 2013-04-25 ENCOUNTER — Encounter: Payer: Self-pay | Admitting: Hematology and Oncology

## 2013-04-25 NOTE — Progress Notes (Signed)
The patient left a message to see if pmt for [employer] can be stopped so she can use on a place to live? I called her back and left a message that it is too late to stop pmt request.

## 2013-05-06 ENCOUNTER — Telehealth: Payer: Self-pay | Admitting: Oncology

## 2013-05-08 ENCOUNTER — Ambulatory Visit: Payer: Medicaid Other | Attending: Oncology | Admitting: Oncology

## 2013-05-08 ENCOUNTER — Telehealth: Payer: Self-pay | Admitting: Oncology

## 2013-05-08 ENCOUNTER — Other Ambulatory Visit: Payer: Self-pay | Admitting: Oncology

## 2013-05-08 ENCOUNTER — Encounter: Payer: Self-pay | Admitting: Oncology

## 2013-05-08 ENCOUNTER — Other Ambulatory Visit (HOSPITAL_BASED_OUTPATIENT_CLINIC_OR_DEPARTMENT_OTHER): Payer: Medicaid Other | Admitting: Lab

## 2013-05-08 VITALS — BP 129/83 | HR 82 | Temp 98.7°F | Resp 20 | Ht 65.0 in | Wt 186.5 lb

## 2013-05-08 DIAGNOSIS — R0781 Pleurodynia: Secondary | ICD-10-CM

## 2013-05-08 DIAGNOSIS — R079 Chest pain, unspecified: Secondary | ICD-10-CM

## 2013-05-08 DIAGNOSIS — D693 Immune thrombocytopenic purpura: Secondary | ICD-10-CM

## 2013-05-08 LAB — CBC WITH DIFFERENTIAL/PLATELET
Eosinophils Absolute: 0.2 10*3/uL (ref 0.0–0.5)
LYMPH%: 35.6 % (ref 14.0–49.7)
MONO#: 0.7 10*3/uL (ref 0.1–0.9)
NEUT#: 2.9 10*3/uL (ref 1.5–6.5)
Platelets: 311 10*3/uL (ref 145–400)
RBC: 4.74 10*6/uL (ref 3.70–5.45)
RDW: 13.8 % (ref 11.2–14.5)
WBC: 6.1 10*3/uL (ref 3.9–10.3)

## 2013-05-08 LAB — COMPREHENSIVE METABOLIC PANEL (CC13)
Albumin: 3.5 g/dL (ref 3.5–5.0)
CO2: 21 mEq/L — ABNORMAL LOW (ref 22–29)
Chloride: 111 mEq/L — ABNORMAL HIGH (ref 98–109)
Glucose: 96 mg/dl (ref 70–140)
Potassium: 4.3 mEq/L (ref 3.5–5.1)
Sodium: 142 mEq/L (ref 136–145)
Total Protein: 7 g/dL (ref 6.4–8.3)

## 2013-05-08 MED ORDER — OXYCODONE-ACETAMINOPHEN 5-325 MG PO TABS: 1.0000 | ORAL_TABLET | Freq: Three times a day (TID) | ORAL | Status: DC | PRN

## 2013-05-08 NOTE — Telephone Encounter (Signed)
, °

## 2013-05-08 NOTE — Progress Notes (Signed)
OFFICE PROGRESS NOTE   05/08/2013   Physicians:W.Truslow, E.Avbuerre. 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 She is now on plaquenil by Dr Kellie Simmering; she has not needed treatment for the ITP since Rituxan  x2 in April and May 2014. She has had multiple episodes of severe tonsillitis, with tonsillectomy recommended by ENT; she has follow up scheduled with Dr Melvenia Beam 05-15-13 and hopefully can proceed with this as platelets are in excellent range now. Patient's 52 yo son was killed in MVA on March 06, 2013. This was reason that she missed some scheduled medical appointments, and she is still acutely grieving. I have offered counseling support if she would like, and my condolences.   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 She was treated then with steroids/ IVIG and eventually resolved the thrombocytopenia with Rituxan. 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 Platelet count improved rapidly with prednisone in late Jan, which was tapered down and off by 11-13-12. I saw her last in office 11-05-12 after which she missed at least 2 appointments with me and we were unable to reach her by phone. Last CBC 11-12-12 with WBC 5.0, Hgb 11.6 and plt 276. She then presented to [facility] ED on 12-15-12 with 5-7 days of bruising and oral bleeding, with platelets <5k, hgb 12.2 and WBC 5.0. She had not been compliant with Plaquenil by Dr Kellie Simmering for last 3 month  prescription. She was begun on steroids with improvement in platelets up to 47k by 12-18-12. She had iron deficiency documented in Feb 2014.and received IV feraheme during that hospitalization. She was DC home on 12-18-12 with plans for close follow up at [facility], tho I believe she still missed lab and visit shortly after the DC. CBC on 12-24-12 had WBC 12.8 on steroids, Hgb 11.6 and plt up to 164k. 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 By 12-31-12, platelets were back doen to 7k, without overt bleeding. With previous good response to Rituxan after inadequate response to initial prednisone and IVIG in 2011, Rituxan was resumed April 2014 with 2 doses given and improvement in platelet count. She missed appointments in June/July due to death of young son. Counts were in good range at [facility]  04-15-13.   Review of systems as above, also: no bleeding or bruising. No recent fever, no symptoms of acute tonsillitis now tho chronic mild sore throat and posterior nasal drainage, no symptoms of acute sinusitis now, no lower respiratory symptoms. Joint symptoms are better on plaquenil other than wrists. Painful area of swelling left lateral mid ribs x ~ 3 weeks, no known trauma; increased pain with pressure or deep breath. Not able to sleep since son's death, not sure what dose of (?) restoril was helpful previously and she will contact Dr Avbuerre's office. Is able to eat and bowels moving normally.  Remainder of 10 point Review of Systems negative.  Objective:  Vital signs in last 24 hours:  BP 129/83  Pulse 82  Temp(Src) 98.7 F (37.1 C) (Oral)  Resp 20  Ht 5\' 5"  (1.651 m)  Wt 186 lb 8 oz (84.596 kg)  BMI 31.04 kg/m2  Alert, crying re son's death, easily mobile, respirations not labored and sounds less congested that oftentimes.  HEENT:PERRL, sclera clear, anicteric and oral mucosa clear, no lesions. Prominent tonsillar tissue L>R without erythema or exudate now, actually the best that I have  seen her. Lymphatics cervical,suraclavicular, axillary or inguinal adenopathy Resp: clear to auscultation bilaterally and normal percussion bilaterally Cardio: regular rate and rhythm Chest wall: tender swelling ~ 5 x 6 cm left mid ribs laterally, no erythema or heat GI: soft, nontender, not distended, no mass or organomegaly Extremities: without pitting edema, cords, tenderness Neuro: nonfocal Skin without ecchymosis, petechiae, rash No central catheter  Lab Results:  Results for orders placed in visit on 05/08/13  CBC WITH DIFFERENTIAL      Result Value Range   WBC 6.1  3.9 - 10.3 10e3/uL   NEUT# 2.9  1.5 - 6.5 10e3/uL   HGB 13.5  11.6 - 15.9 g/dL   HCT 40.9  81.1 - 91.4 %   Platelets 311  145 - 400 10e3/uL   MCV 85.1  79.5 - 101.0 fL   MCH 28.5  25.1 - 34.0 pg   MCHC 33.5  31.5 - 36.0 g/dL   RBC 7.82  9.56 - 2.13 10e6/uL   RDW 13.8  11.2 - 14.5 %   lymph# 2.2  0.9 - 3.3 10e3/uL   MONO# 0.7  0.1 - 0.9 10e3/uL   Eosinophils Absolute 0.2  0.0 - 0.5 10e3/uL   Basophils Absolute 0.0  0.0 - 0.1 10e3/uL   NEUT% 48.7  38.4 - 76.8 %   LYMPH% 35.6  14.0 - 49.7 %   MONO% 11.9  0.0 - 14.0 %   EOS% 3.1  0.0 - 7.0 %   BASO% 0.7  0.0 - 2.0 %  COMPREHENSIVE METABOLIC PANEL (CC13)      Result Value Range   Sodium 142  136 - 145 mEq/L   Potassium 4.3  3.5 - 5.1 mEq/L   Chloride 111 (*) 98 - 109 mEq/L   CO2 21 (*) 22 - 29 mEq/L   Glucose 96  70 - 140 mg/dl   BUN 08.6  7.0 - 57.8 mg/dL   Creatinine 0.8  0.6 - 1.1 mg/dL   Total Bilirubin 4.69  0.20 - 1.20 mg/dL   Alkaline Phosphatase 71  40 - 150 U/L   AST 12  5 - 34 U/L   ALT 11  0 - 55 U/L   Total Protein 7.0  6.4 - 8.3 g/dL   Albumin 3.5  3.5 - 5.0 g/dL   Calcium 9.0  8.4 - 62.9 mg/dL    I have given her copy of CBC to take to Dr Ellyn Hack appointment on 05-15-13  Studies/Results:  Xray left rib details ordered now  Medications: I have reviewed the patient's current medications. She can stop oral iron, will continue oral folate  as she tolerates this well and it will not cause problems. Percocet #10 prescription given today for left rib problem, this prescription regularly filled by PCP rather than thru this office.  Assessment/Plan: 1. 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 Counts stable in good range today, will follow. Good response to brief course Rituxan this spring. Mild rituxan reaction rate related 01-03-13. I will see her in Oct, or sooner if needed. Marland Kitchen  2.SLE followed by Dr Kellie Simmering, continuing Plaquenil.  3.prolonged, severe issues with recurrent sinusitis, tonsillitis, bronchitis: agree with ENT recommendation for tonsillectomy. OK for procedure from hematologic standpoint now. Cc this note Dr Emeline Darling for visit to him 05-15-13 and I am glad to discuss with him if needed. 4. Long cigarette use, now discontinued.  nicotine patches helpful  5.post left hip replacement for avascular necrosis, fall 2013. She is at risk for similar problems right hip if steroids required in future  6. Iron deficiency anemia by labs Feb 2014: post IV feraheme 12-17-12.Hgb and MCV normal now. 7. Death of 65 yo son in MVA 03/26/2013 8.tender swelling left lateral ribs x 3 weeks: Possible rib fracture tho history not clear for this, will get xray today.   Time spent 25 min including >50% discussion and coordination of care.  LIVESAY,LENNIS P, MD   05/08/2013, 10:56 AM

## 2013-05-15 ENCOUNTER — Ambulatory Visit: Payer: Medicaid Other

## 2013-05-15 ENCOUNTER — Other Ambulatory Visit: Payer: Medicaid Other | Admitting: Lab

## 2013-05-28 ENCOUNTER — Other Ambulatory Visit: Payer: Self-pay | Admitting: Internal Medicine

## 2013-05-28 DIAGNOSIS — D179 Benign lipomatous neoplasm, unspecified: Secondary | ICD-10-CM

## 2013-05-30 ENCOUNTER — Ambulatory Visit
Admission: RE | Admit: 2013-05-30 | Discharge: 2013-05-30 | Disposition: A | Discharge status: Home / Self Care | Payer: Medicaid Other | Source: Ambulatory Visit | Attending: Internal Medicine | Admitting: Internal Medicine

## 2013-05-30 DIAGNOSIS — D179 Benign lipomatous neoplasm, unspecified: Secondary | ICD-10-CM

## 2013-06-18 ENCOUNTER — Ambulatory Visit: Payer: Medicaid Other | Admitting: Oncology

## 2013-06-18 ENCOUNTER — Other Ambulatory Visit: Payer: Medicaid Other | Admitting: Lab

## 2013-06-19 ENCOUNTER — Telehealth: Payer: Self-pay | Admitting: Oncology

## 2013-06-19 NOTE — Telephone Encounter (Signed)
pt called to r/s missed appt...done... °

## 2013-06-20 ENCOUNTER — Ambulatory Visit: Payer: Medicaid Other | Admitting: Oncology

## 2013-06-24 ENCOUNTER — Telehealth: Payer: Self-pay

## 2013-06-24 NOTE — Telephone Encounter (Signed)
Ms. Darrick Penna stated that her PCP Dr. Concepcion Elk did an [place] of Abd on 05-30-13 and the area was fatty tissue as Dr. Darrold Span suspected.  Will see notify Dr. Darrold Span of [place]. Report. Pt. R/s her appointment to 06-30-13 with Dr. Darrold Span.

## 2013-06-24 NOTE — Telephone Encounter (Signed)
Message copied by Lorine Bears on Tue Jun 24, 2013 12:54 PM ------      Message from: Reece Packer      Created: Sat May 10, 2013  9:36 AM       Need to f/u with patient re left rib xray results if this gets done in next week, otherwise please follow up with patient if she does not have xray    thanks ------

## 2013-06-30 ENCOUNTER — Encounter: Payer: Self-pay | Admitting: Oncology

## 2013-06-30 ENCOUNTER — Other Ambulatory Visit: Payer: Self-pay

## 2013-06-30 ENCOUNTER — Ambulatory Visit (HOSPITAL_BASED_OUTPATIENT_CLINIC_OR_DEPARTMENT_OTHER): Payer: Medicaid Other | Admitting: Oncology

## 2013-06-30 ENCOUNTER — Other Ambulatory Visit (HOSPITAL_BASED_OUTPATIENT_CLINIC_OR_DEPARTMENT_OTHER): Payer: Medicaid Other | Admitting: Lab

## 2013-06-30 ENCOUNTER — Telehealth: Payer: Self-pay | Admitting: *Deleted

## 2013-06-30 ENCOUNTER — Telehealth: Payer: Self-pay | Admitting: Oncology

## 2013-06-30 VITALS — BP 116/76 | HR 78 | Temp 99.0°F | Resp 18 | Ht 65.0 in | Wt 188.2 lb

## 2013-06-30 DIAGNOSIS — Z23 Encounter for immunization: Secondary | ICD-10-CM

## 2013-06-30 DIAGNOSIS — D509 Iron deficiency anemia, unspecified: Secondary | ICD-10-CM

## 2013-06-30 DIAGNOSIS — D693 Immune thrombocytopenic purpura: Secondary | ICD-10-CM

## 2013-06-30 DIAGNOSIS — Z87891 Personal history of nicotine dependence: Secondary | ICD-10-CM

## 2013-06-30 DIAGNOSIS — M329 Systemic lupus erythematosus, unspecified: Secondary | ICD-10-CM

## 2013-06-30 LAB — CBC WITH DIFFERENTIAL/PLATELET
Basophils Absolute: 0.1 10*3/uL (ref 0.0–0.1)
HCT: 40 % (ref 34.8–46.6)
HGB: 13.1 g/dL (ref 11.6–15.9)
LYMPH%: 38.8 % (ref 14.0–49.7)
MCH: 27.7 pg (ref 25.1–34.0)
MCHC: 32.8 g/dL (ref 31.5–36.0)
MONO#: 0.7 10*3/uL (ref 0.1–0.9)
NEUT%: 43.6 % (ref 38.4–76.8)
Platelets: 321 10*3/uL (ref 145–400)
WBC: 5.7 10*3/uL (ref 3.9–10.3)
lymph#: 2.2 10*3/uL (ref 0.9–3.3)

## 2013-06-30 MED ORDER — ALBUTEROL SULFATE HFA 108 (90 BASE) MCG/ACT IN AERS: 2.0000 | INHALATION_SPRAY | Freq: Four times a day (QID) | RESPIRATORY_TRACT | Status: DC | PRN

## 2013-06-30 MED ORDER — INFLUENZA VAC SPLIT QUAD 0.5 ML IM SUSP
0.5000 mL | Freq: Once | INTRAMUSCULAR | Status: AC
  Administered 2013-06-30: 0.5 mL via INTRAMUSCULAR
  Filled 2013-06-30: qty 0.5

## 2013-06-30 MED ORDER — NICOTINE 14 MG/24HR TD PT24: 1.0000 | MEDICATED_PATCH | TRANSDERMAL | Status: DC

## 2013-06-30 NOTE — Patient Instructions (Signed)
I have asked staff to call you about stop smoking programs and assistance, but you may want to call them yourself  Dana Kennedy [phone] Dana Kennedy [phone] Dana Kennedy [phone]   We will do one refill on your inhaler, then you should get other refills from primary MD  Try benadryl (diphenhydramine) over the counter   25 mg at bedtime and repeat in 4-6 hours if you wake during night. This will make you sleepy + will help some of the sinus drainage   Try over the counter children's decongestant between today and appointment back to ENT later this week, for pressure in left ear.

## 2013-06-30 NOTE — Telephone Encounter (Signed)
Called pt but unable to leave a message, will call back about smoking cessation.

## 2013-06-30 NOTE — Progress Notes (Signed)
OFFICE PROGRESS NOTE   06/30/2013   Physicians:W.Truslow, E.Avbuerre. B.Marshall, C.Blackman, Melvenia Beam (ENT)   INTERVAL HISTORY:   Patient is seen, alone for this rescheduled visit, in follow up of history of ITP/ Evans syndrome. She has had no problems that seem related to the hematologic problems since she was here last. She completed another course of antibiotics from Dr Emeline Darling ~ 4 days ago and hopes to have tonsillectomy scheduled when she sees him again in next couple of weeks. Throat is improved, not too much sinus drainage, some left ear pain today. She stopped smoking 3 weeks ago (1.5 ppd) and requests nicotine patches. She had abdominal US 05-30-13 confirming lipoma LUQ laterally. Arthritis symptoms are stable.  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 She was treated then with steroids/ IVIG and eventually resolved the thrombocytopenia with Rituxan. 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 Platelet count improved rapidly with prednisone in late Jan, which was tapered down and off by 11-13-12. I saw her 11-05-12 after which she missed at least 2 appointments with me and we were unable to reach her by phone. Last CBC 11-12-12 with WBC 5.0, Hgb 11.6 and plt 276. She then presented to [facility] ED on 12-15-12 with 5-7 days of bruising and oral bleeding, with platelets <5k, hgb 12.2 and WBC 5.0. She had not been compliant with Plaquenil by Dr Kellie Simmering for last 3 month prescription. She was begun on steroids with improvement in platelets up to 47k by 12-18-12. She had iron deficiency documented in Feb 2014.and received IV feraheme during that hospitalization. She was DC home on 12-18-12 with plans for close follow up at  [facility], tho I believe she still missed lab and visit shortly after the DC. CBC on 12-24-12 had WBC 12.8 on steroids, Hgb 11.6 and plt up to 164k. 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 By 12-31-12, platelets were back doen to 7k, without overt bleeding. With previous good response to Rituxan after inadequate response to initial prednisone and IVIG in 2011, Rituxan was resumed April 2014 with 2 doses given and improvement in platelet count. She missed appointments in June/July due to death of teenage son in MVA. Counts were in good range at [facility] 04-15-13.   Review of systems as above, also: No fever. No productive cough. No bruising or bleeding. Arthritis symptoms are in knees and hands now, not too bothersome. She had HA with restoril, does get drowsy with benadryl and will try that at hs. Inhaler from PCP helpful, needs refill on that to get to next appt with PCP in Nov. Appetite good. Remainder of 10 point Review of Systems negative.  Objective:  Vital signs in last 24 hours:  BP 116/76  Pulse 78  Temp(Src) 99 F (37.2 C) (Oral)  Resp 18  Ht 5\' 5"  (1.651 m)  Wt 188 lb 3.2 oz (85.367 kg)  BMI 31.32 kg/m2  SpO2 98% She looks better overall today, more talkative and brighter. Alert, oriented and appropriate. Ambulatory without difficulty.   HEENT:PERRL, sclerae not icteric. Oral mucosa moist without lesions, posterior pharynx with tonsillar prominence R>L and more erythema on right, no exudate and overall better than I have often seen. Left TM no erythema,  good light reflex. Right TM partially obscured with cerumen, dull, not erythematous. Neck supple. No JVD.  Lymphatics:no cervical,suraclavicular, axillary or inguinal adenopathy Resp: clear to auscultation bilaterally and normal percussion bilaterally Cardio: regular rate and rhythm. No gallop. GI: soft, nontender, not distended, no mass or organomegaly. Normally active bowel sounds. Soft lipoma lateral left not  tender Musculoskeletal/ Extremities: without pitting edema, cords, tenderness Neuro: no peripheral neuropathy. Otherwise nonfocal. Psych as above Skin without rash, ecchymosis, petechiae   Lab Results:  Results for orders placed in visit on 06/30/13  CBC WITH DIFFERENTIAL      Result Value Range   WBC 5.7  3.9 - 10.3 10e3/uL   NEUT# 2.5  1.5 - 6.5 10e3/uL   HGB 13.1  11.6 - 15.9 g/dL   HCT 78.2  95.6 - 21.3 %   Platelets 321  145 - 400 10e3/uL   MCV 84.6  79.5 - 101.0 fL   MCH 27.7  25.1 - 34.0 pg   MCHC 32.8  31.5 - 36.0 g/dL   RBC 0.86  5.78 - 4.69 10e6/uL   RDW 13.4  11.2 - 14.5 %   lymph# 2.2  0.9 - 3.3 10e3/uL   MONO# 0.7  0.1 - 0.9 10e3/uL   Eosinophils Absolute 0.2  0.0 - 0.5 10e3/uL   Basophils Absolute 0.1  0.0 - 0.1 10e3/uL   NEUT% 43.6  38.4 - 76.8 %   LYMPH% 38.8  14.0 - 49.7 %   MONO% 13.1  0.0 - 14.0 %   EOS% 3.4  0.0 - 7.0 %   BASO% 1.1  0.0 - 2.0 %    Copy of labs to patient to take to Dr Emeline Darling  Studies/Results: US ABDOMEN LIMITED - RIGHT UPPER QUADRANT  COMPARISON: None.  FINDINGS:  Gallbladder:  8 x 9 mm gallbladder polyp. No gallstones, gallbladder wall  thickening, or pericholecystic fluid. Negative sonographic Murphy's  sign.  Common bile duct  Diameter: Measures 5 mm.  Liver:  No focal lesion identified. Within normal limits in parenchymal  echogenicity.  Additional comments: 8.2 x 3.7 x 5.3 cm subcutaneous left abdominal  wall mass, corresponding to the benign lipoma on prior 2011 CT,  without vascularity.  IMPRESSION:  8.2 cm subcutaneous left abdominal wall mass, corresponding to the  benign lipoma on prior CT, increased from 2011.  9 mm gallbladder polyp. Follow-up ultrasound is suggested in 1 year.   Medications: I have reviewed the patient's current medications. Flu vaccine given today.  She will use benadryl 25 mg q 4-6 hrs to sleep at hs.  We will give 1 refill on albuterol inhaler then further per PCP. She will try OTC children's  decongestant starting today for right ear discomfort.  Assessment/Plan: 1. 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 Counts stable in good range today, will follow. Good response to brief course Rituxan this spring. Mild rituxan reaction rate related 01-03-13. I will see her in 4 months, or sooner if needed.  Marland Kitchen2.SLE followed by Dr Kellie Simmering, continuing Plaquenil.  3.prolonged, severe issues with recurrent sinusitis, tonsillitis, bronchitis since I have known her: agree with ENT recommendation for tonsillectomy. OK for procedure from hematologic standpoint now.  4. Long cigarette use, now discontinued. nicotine patches helpful  5.post left hip replacement for avascular necrosis, fall 2013. She is at risk for similar problems right hip if steroids required in future  6. Iron deficiency anemia by labs Feb 2014: post IV feraheme 12-17-12.Hgb and MCV normal now.  7. Death of 26 yo son in MVA 03-06-13 8.flu vaccine given today  Patient has had questions answered to her satisfaction and agrees with plan above.  Tilla Wilborn P, MD   06/30/2013, 10:44 AM

## 2013-07-01 ENCOUNTER — Telehealth: Payer: Self-pay | Admitting: *Deleted

## 2013-07-01 NOTE — Telephone Encounter (Signed)
Called pt regarding smoking cessation.  Unable to leave message.  I will send smoking cessation information via mail today.  I will call pt back at a later time.

## 2013-07-21 ENCOUNTER — Encounter: Payer: Self-pay | Admitting: Oncology

## 2013-07-21 NOTE — Progress Notes (Signed)
Patient had left message to see if any funds left. She used all of hers for [employer]. I called to advise she has zero balance. No more funds available.

## 2013-08-21 ENCOUNTER — Telehealth: Payer: Self-pay

## 2013-08-21 DIAGNOSIS — D693 Immune thrombocytopenic purpura: Secondary | ICD-10-CM

## 2013-08-21 NOTE — Telephone Encounter (Signed)
Ms. Dana Kennedy called stating that she is having her tonsils out 09-03-13.  She would like to come in to have CBC done prior to this to see what her platlet count is prior to the surgery.

## 2013-08-21 NOTE — Telephone Encounter (Signed)
Told Dana Kennedy that Dr. Darrold Span said that it would be fine to get labs prior to surgery.  Dana Kennedy set up for lab on 08-29-13 at 1130.  She will wait for the results.

## 2013-08-29 ENCOUNTER — Other Ambulatory Visit: Payer: Self-pay

## 2013-08-29 ENCOUNTER — Other Ambulatory Visit (HOSPITAL_BASED_OUTPATIENT_CLINIC_OR_DEPARTMENT_OTHER): Payer: Medicaid Other

## 2013-08-29 ENCOUNTER — Telehealth: Payer: Self-pay | Admitting: *Deleted

## 2013-08-29 ENCOUNTER — Encounter: Payer: Self-pay | Admitting: *Deleted

## 2013-08-29 DIAGNOSIS — D693 Immune thrombocytopenic purpura: Secondary | ICD-10-CM

## 2013-08-29 DIAGNOSIS — Z1231 Encounter for screening mammogram for malignant neoplasm of breast: Secondary | ICD-10-CM

## 2013-08-29 LAB — CBC WITH DIFFERENTIAL/PLATELET
BASO%: 0.3 % (ref 0.0–2.0)
Basophils Absolute: 0 10*3/uL (ref 0.0–0.1)
EOS%: 1.8 % (ref 0.0–7.0)
HCT: 40.8 % (ref 34.8–46.6)
HGB: 13.5 g/dL (ref 11.6–15.9)
LYMPH%: 31.7 % (ref 14.0–49.7)
MCH: 27.6 pg (ref 25.1–34.0)
MCHC: 33.1 g/dL (ref 31.5–36.0)
MCV: 83.4 fL (ref 79.5–101.0)
MONO%: 8.2 % (ref 0.0–14.0)
NEUT%: 58 % (ref 38.4–76.8)
Platelets: 266 10*3/uL (ref 145–400)

## 2013-08-29 NOTE — Telephone Encounter (Signed)
Message copied by Phillis Knack on Fri Aug 29, 2013  1:08 PM ------      Message from: Reece Packer      Created: Fri Aug 29, 2013 12:29 PM       Labs seen and need follow up: please let her know counts look great including platelets 266, I think for tonsillectomy upcoming. Please send results to surgeon.      Cc TH, AL ------

## 2013-08-29 NOTE — Telephone Encounter (Signed)
Left message for pt with note below. Asked her to call with name of surgeon

## 2013-08-29 NOTE — Progress Notes (Signed)
[facility] Clinical Social Work  Clinical Social Work was referred by patient for assessment of psychosocial needs due to self reported food insecurity.  Clinical Social Worker met with patient at [facility]  to offer support and assess for needs.  Pt reports she missed her food stamp recert appointment and won't get recertified until Jan. CSW provided Pt with list of food resources and assistance programs. Pt very appreciative and denies other concerns.     Doreen Salvage, LCSW Clinical Social Worker [facility] Wednesday, Thursday and Friday Phone: [phone] Fax: [phone]

## 2013-08-31 ENCOUNTER — Telehealth: Payer: Self-pay

## 2013-08-31 NOTE — Telephone Encounter (Signed)
Message copied by Lorine Bears on Sun Aug 31, 2013  9:07 PM ------      Message from: Reece Packer      Created: Fri Aug 29, 2013 12:29 PM       Labs seen and need follow up: please let her know counts look great including platelets 266, I think for tonsillectomy upcoming. Please send results to surgeon.      Cc TH, AL ------

## 2013-08-31 NOTE — Telephone Encounter (Signed)
Faxed labs as noted below to Surgeon ENT Dr. Rosary Lively @ [phone].

## 2013-09-01 ENCOUNTER — Telehealth: Payer: Self-pay | Admitting: Oncology

## 2013-10-06 ENCOUNTER — Ambulatory Visit: Payer: Medicaid Other

## 2013-10-08 ENCOUNTER — Other Ambulatory Visit: Payer: Self-pay | Admitting: Otolaryngology

## 2013-10-12 ENCOUNTER — Encounter (HOSPITAL_COMMUNITY): Payer: Self-pay | Admitting: Emergency Medicine

## 2013-10-12 ENCOUNTER — Emergency Department (HOSPITAL_COMMUNITY): Payer: Medicaid Other

## 2013-10-12 ENCOUNTER — Observation Stay (HOSPITAL_COMMUNITY)
Admission: EM | Admit: 2013-10-12 | Discharge: 2013-10-13 | Disposition: A | Discharge status: Home / Self Care | Payer: Medicaid Other | Attending: Otolaryngology | Admitting: Otolaryngology

## 2013-10-12 DIAGNOSIS — E860 Dehydration: Secondary | ICD-10-CM | POA: Insufficient documentation

## 2013-10-12 DIAGNOSIS — K219 Gastro-esophageal reflux disease without esophagitis: Secondary | ICD-10-CM | POA: Insufficient documentation

## 2013-10-12 DIAGNOSIS — G8918 Other acute postprocedural pain: Principal | ICD-10-CM | POA: Insufficient documentation

## 2013-10-12 DIAGNOSIS — J320 Chronic maxillary sinusitis: Secondary | ICD-10-CM | POA: Insufficient documentation

## 2013-10-12 DIAGNOSIS — J322 Chronic ethmoidal sinusitis: Secondary | ICD-10-CM | POA: Insufficient documentation

## 2013-10-12 DIAGNOSIS — Z87891 Personal history of nicotine dependence: Secondary | ICD-10-CM | POA: Insufficient documentation

## 2013-10-12 DIAGNOSIS — J45909 Unspecified asthma, uncomplicated: Secondary | ICD-10-CM | POA: Insufficient documentation

## 2013-10-12 DIAGNOSIS — R070 Pain in throat: Secondary | ICD-10-CM | POA: Insufficient documentation

## 2013-10-12 DIAGNOSIS — M329 Systemic lupus erythematosus, unspecified: Secondary | ICD-10-CM | POA: Insufficient documentation

## 2013-10-12 LAB — CBC WITH DIFFERENTIAL/PLATELET
Basophils Absolute: 0 10*3/uL (ref 0.0–0.1)
Basophils Relative: 0 % (ref 0–1)
Eosinophils Absolute: 0.5 10*3/uL (ref 0.0–0.7)
Eosinophils Relative: 6 % — ABNORMAL HIGH (ref 0–5)
HCT: 41 % (ref 36.0–46.0)
HEMOGLOBIN: 14 g/dL (ref 12.0–15.0)
Lymphocytes Relative: 25 % (ref 12–46)
Lymphs Abs: 2 10*3/uL (ref 0.7–4.0)
MCH: 28.4 pg (ref 26.0–34.0)
MCHC: 34.1 g/dL (ref 30.0–36.0)
MCV: 83.2 fL (ref 78.0–100.0)
MONOS PCT: 11 % (ref 3–12)
Monocytes Absolute: 0.9 10*3/uL (ref 0.1–1.0)
Neutro Abs: 4.8 10*3/uL (ref 1.7–7.7)
Neutrophils Relative %: 59 % (ref 43–77)
Platelets: 428 10*3/uL — ABNORMAL HIGH (ref 150–400)
RBC: 4.93 MIL/uL (ref 3.87–5.11)
RDW: 14.1 % (ref 11.5–15.5)
WBC: 8.2 10*3/uL (ref 4.0–10.5)

## 2013-10-12 LAB — BASIC METABOLIC PANEL
BUN: 5 mg/dL — AB (ref 6–23)
CHLORIDE: 101 meq/L (ref 96–112)
CO2: 24 mEq/L (ref 19–32)
Calcium: 9.2 mg/dL (ref 8.4–10.5)
Creatinine, Ser: 0.7 mg/dL (ref 0.50–1.10)
GFR calc Af Amer: >90 mL/min (ref >90)
GFR calc non Af Amer: >90 mL/min (ref >90)
Glucose, Bld: 105 mg/dL — ABNORMAL HIGH (ref 70–99)
Potassium: 3.9 mEq/L (ref 3.7–5.3)
Sodium: 140 mEq/L (ref 137–147)

## 2013-10-12 MED ORDER — AMINOCAPROIC ACID SOLUTION 5% (50 MG/ML)
5.0000 mL | Freq: Four times a day (QID) | ORAL | Status: DC
  Administered 2013-10-12: 5 mL via ORAL
  Filled 2013-10-12 (×2): qty 100

## 2013-10-12 MED ORDER — PANTOPRAZOLE SODIUM 40 MG IV SOLR
40.0000 mg | Freq: Every day | INTRAVENOUS | Status: DC
  Administered 2013-10-12: 40 mg via INTRAVENOUS
  Filled 2013-10-12 (×2): qty 40

## 2013-10-12 MED ORDER — DEXAMETHASONE SODIUM PHOSPHATE 10 MG/ML IJ SOLN
10.0000 mg | Freq: Three times a day (TID) | INTRAMUSCULAR | Status: DC
  Administered 2013-10-12 – 2013-10-13 (×2): 10 mg via INTRAVENOUS
  Filled 2013-10-12 (×5): qty 1

## 2013-10-12 MED ORDER — KCL IN DEXTROSE-NACL 10-5-0.45 MEQ/L-%-% IV SOLN
INTRAVENOUS | Status: DC
  Administered 2013-10-12 – 2013-10-13 (×2): via INTRAVENOUS
  Filled 2013-10-12 (×3): qty 1000

## 2013-10-12 MED ORDER — DOCUSATE SODIUM 100 MG PO CAPS: 100.0000 mg | ORAL_CAPSULE | Freq: Two times a day (BID) | ORAL | Status: DC | PRN

## 2013-10-12 MED ORDER — SODIUM CHLORIDE 0.9 % IV BOLUS (SEPSIS)
1000.0000 mL | Freq: Once | INTRAVENOUS | Status: AC
  Administered 2013-10-12: 1000 mL via INTRAVENOUS

## 2013-10-12 MED ORDER — ACETAMINOPHEN 160 MG/5ML PO SOLN: 325.0000 mg | ORAL | Status: DC | PRN

## 2013-10-12 MED ORDER — ONDANSETRON HCL 4 MG/2ML IJ SOLN: 4.0000 mg | Freq: Four times a day (QID) | INTRAMUSCULAR | Status: DC | PRN

## 2013-10-12 MED ORDER — NICOTINE 14 MG/24HR TD PT24
14.0000 mg | MEDICATED_PATCH | TRANSDERMAL | Status: DC
  Administered 2013-10-12: 14 mg via TRANSDERMAL
  Filled 2013-10-12 (×2): qty 1

## 2013-10-12 MED ORDER — DIPHENHYDRAMINE HCL 50 MG/ML IJ SOLN: 12.5000 mg | Freq: Four times a day (QID) | INTRAMUSCULAR | Status: DC | PRN

## 2013-10-12 MED ORDER — FOLIC ACID 1 MG PO TABS
1.0000 mg | ORAL_TABLET | Freq: Every day | ORAL | Status: DC
  Administered 2013-10-13: 1 mg via ORAL
  Filled 2013-10-12: qty 1

## 2013-10-12 MED ORDER — FERROUS SULFATE 325 (65 FE) MG PO TABS
325.0000 mg | ORAL_TABLET | Freq: Every day | ORAL | Status: DC
  Administered 2013-10-13: 325 mg via ORAL
  Filled 2013-10-12 (×2): qty 1

## 2013-10-12 MED ORDER — METHOCARBAMOL 500 MG PO TABS
500.0000 mg | ORAL_TABLET | Freq: Four times a day (QID) | ORAL | Status: DC
  Administered 2013-10-12 – 2013-10-13 (×2): 500 mg via ORAL
  Filled 2013-10-12 (×6): qty 1

## 2013-10-12 MED ORDER — OSELTAMIVIR PHOSPHATE 6 MG/ML PO SUSR
75.0000 mg | Freq: Two times a day (BID) | ORAL | Status: DC
  Administered 2013-10-12 – 2013-10-13 (×2): 75 mg via ORAL
  Filled 2013-10-12 (×3): qty 12.5

## 2013-10-12 MED ORDER — ONDANSETRON HCL 4 MG/2ML IJ SOLN
4.0000 mg | Freq: Once | INTRAMUSCULAR | Status: AC
  Administered 2013-10-12: 4 mg via INTRAVENOUS
  Filled 2013-10-12: qty 2

## 2013-10-12 MED ORDER — MORPHINE SULFATE 2 MG/ML IJ SOLN
2.0000 mg | INTRAMUSCULAR | Status: DC | PRN
  Administered 2013-10-12 – 2013-10-13 (×2): 2 mg via INTRAVENOUS
  Filled 2013-10-12 (×2): qty 1

## 2013-10-12 MED ORDER — MORPHINE SULFATE 4 MG/ML IJ SOLN
4.0000 mg | Freq: Once | INTRAMUSCULAR | Status: AC
  Administered 2013-10-12: 4 mg via INTRAVENOUS
  Filled 2013-10-12: qty 1

## 2013-10-12 MED ORDER — DIPHENHYDRAMINE HCL 12.5 MG/5ML PO ELIX: 12.5000 mg | ORAL_SOLUTION | Freq: Four times a day (QID) | ORAL | Status: DC | PRN

## 2013-10-12 MED ORDER — CLINDAMYCIN PHOSPHATE 600 MG/50ML IV SOLN
600.0000 mg | Freq: Four times a day (QID) | INTRAVENOUS | Status: DC
  Administered 2013-10-12 – 2013-10-13 (×3): 600 mg via INTRAVENOUS
  Filled 2013-10-12 (×5): qty 50

## 2013-10-12 MED ORDER — ALBUTEROL SULFATE HFA 108 (90 BASE) MCG/ACT IN AERS: 2.0000 | INHALATION_SPRAY | RESPIRATORY_TRACT | Status: DC | PRN

## 2013-10-12 MED ORDER — HYDROCODONE-ACETAMINOPHEN 7.5-325 MG/15ML PO SOLN
15.0000 mL | ORAL | Status: DC | PRN
  Administered 2013-10-13: 15 mL via ORAL
  Filled 2013-10-12: qty 15

## 2013-10-12 MED ORDER — ALBUTEROL SULFATE (2.5 MG/3ML) 0.083% IN NEBU: 2.5000 mg | INHALATION_SOLUTION | RESPIRATORY_TRACT | Status: DC | PRN

## 2013-10-12 MED ORDER — HYDROCODONE-ACETAMINOPHEN 5-325 MG PO TABS: 1.0000 | ORAL_TABLET | Freq: Four times a day (QID) | ORAL | Status: DC | PRN

## 2013-10-12 MED ORDER — HYDROXYCHLOROQUINE SULFATE 200 MG PO TABS
600.0000 mg | ORAL_TABLET | Freq: Every day | ORAL | Status: DC
  Administered 2013-10-13: 600 mg via ORAL
  Filled 2013-10-12: qty 3

## 2013-10-12 MED ORDER — LORATADINE 10 MG PO TABS
10.0000 mg | ORAL_TABLET | Freq: Every day | ORAL | Status: DC
  Administered 2013-10-13: 10 mg via ORAL
  Filled 2013-10-12: qty 1

## 2013-10-12 MED ORDER — NYSTATIN 100000 UNIT/ML MT SUSP
5.0000 mL | Freq: Four times a day (QID) | OROMUCOSAL | Status: DC
  Administered 2013-10-12 – 2013-10-13 (×2): 500000 [IU] via OROMUCOSAL
  Filled 2013-10-12 (×6): qty 5

## 2013-10-12 MED ORDER — IOHEXOL 300 MG/ML  SOLN
75.0000 mL | Freq: Once | INTRAMUSCULAR | Status: AC | PRN
  Administered 2013-10-12: 75 mL via INTRAVENOUS

## 2013-10-12 MED ORDER — LACTINEX PO CHEW
1.0000 | CHEWABLE_TABLET | Freq: Three times a day (TID) | ORAL | Status: DC
  Administered 2013-10-12 – 2013-10-13 (×2): 1 via ORAL
  Filled 2013-10-12 (×6): qty 1

## 2013-10-12 NOTE — H&P (Signed)
10/12/2013  Dana Kennedy  HISTORY AND PHYSICAL  CHIEF COMPLAINT: sore throat and odynophagia/dysphagia post-tonsillectomy  HISTORY: This is a 48 year old who presents POD#4 from uncomplicated adenotonsillectomy with sore throat and difficulty taking PO. CT neck showed normal post-tonsillectomy eschar and no abscess or masses. CT also shows chronic maxillary and ethmoid sinusitis with absent frontal sinuses. Patient wants to be admitted for her pain and sore throat.  PAST MEDICAL HISTORY: Past Medical History  Diagnosis Date  . Lupus   . Asthma   . GERD (gastroesophageal reflux disease)   . Arthritis   . Anemia     PAST SURGICAL HISTORY: Past Surgical History  Procedure Laterality Date  . Stomach ulcer surgery  2008  . Total hip arthroplasty  06/14/2012    Procedure: TOTAL HIP ARTHROPLASTY ANTERIOR APPROACH;  Surgeon: Mcarthur Rossetti, MD;  Location: [facility]S;  Service: Orthopedics;  Laterality: Left;  Left Total Hip Arthroplasty, Anterior Approach C-Arm  . Tonsillectomy      MEDICATIONS: No current facility-administered medications on file prior to encounter.   Current Outpatient Prescriptions on File Prior to Encounter  Medication Sig Dispense Refill  . albuterol (PROVENTIL HFA;VENTOLIN HFA) 108 (90 BASE) MCG/ACT inhaler Inhale 2 puffs into the lungs every 6 (six) hours as needed. For shortness of breath  1 Inhaler  0  . bismuth subsalicylate (PEPTO BISMOL) 262 MG chewable tablet Chew 524 mg by mouth as needed. For upset stomach      . cetirizine (ZYRTEC) 10 MG tablet Take 1 tablet (10 mg total) by mouth daily.  30 tablet  5  . ferrous sulfate 325 (65 FE) MG tablet Take 325 mg by mouth daily with breakfast.      . folic acid (FOLVITE) 1 MG tablet Take 1 tablet (1 mg total) by mouth daily.  30 tablet  2  . hydroxychloroquine (PLAQUENIL) 200 MG tablet Take 600 mg by mouth daily.      . methocarbamol (ROBAXIN) 500 MG tablet Take 500 mg by mouth 4 (four) times daily.      .  nicotine (NICODERM CQ - DOSED IN MG/24 HOURS) 14 mg/24hr patch Place 1 patch onto the skin daily.  30 patch  1  . pantoprazole (PROTONIX) 40 MG tablet Take 1 tablet (40 mg total) by mouth every morning.  30 tablet  2    ALLERGIES: Allergies  Allergen Reactions  . Aspirin     Causes bleeding  . Eszopiclone Swelling    Felt like throat was closing. Not sure if it was related to this medication or not. lunesta  . Restoril [Temazepam]     Headaches      SOCIAL HISTORY: History   Social History  . Marital Status: Married    Spouse Name: N/A    Number of Children: N/A  . Years of Education: N/A   Occupational History  . Not on file.   Social History Main Topics  . Smoking status: Former Smoker -- 0.50 packs/day for 29 years    Types: Cigarettes    Quit date: 06/09/2013  . Smokeless tobacco: Never Used  . Alcohol Use: Yes     Comment: occasionally  . Drug Use: No  . Sexual Activity: Yes   Other Topics Concern  . Not on file   Social History Narrative  . No narrative on file    FAMILY HISTORY: Family History  Problem Relation Age of Onset  . Cancer Mother   . Hypertension Mother     REVIEW OF  SYSTEMS:  HEENT: sore throat and dysphagia/odynophagia, otherwise negative x 12 systems except per HPI  PHYSICAL EXAM:  GENERAL:  NAD VITAL SIGNS:   Filed Vitals:   10/12/13 1830  BP: 140/92  Pulse: 93  Temp:   Resp:    SKIN:  Warm, dry HEENT:  Mallampati class (II / III, tonsils surgically absent with normal eschars, hemostatic fossae, normal appearing expected post-tonsillectomy uvular edema. NECK:  Supple, trachea midline LUNGS:  Grossly clear CARDIOVASCULAR:  RRR ABDOMEN:  soft MUSCULOSKELETAL: normal strength  PSYCH:  Normal affect NEUROLOGIC:  CN 2-12 intact and symmetric  DIAGNOSTIC STUDIES: CT shows normal post adenotonsillectomy changes and chronic maxillary and ethmoid sinusitis  ASSESSMENT AND PLAN: Will admit for tamiflu, clindamycin, decadron, IV  fluids, IV pain medications, nystatin swish and spit, the antibiotics and steroids should help with her sinusitis as well. Will discharge when patient feels her swallowing/pain improved. 10/12/2013  7:25 PM Dana Kennedy

## 2013-10-12 NOTE — ED Provider Notes (Signed)
CSN: [id number]     Arrival date & time 10/12/13  1646 History   First MD Initiated Contact with Patient 10/12/13 1745     Chief Complaint  Patient presents with  . Post-op Problem   (Consider location/radiation/quality/duration/timing/severity/associated sxs/prior Treatment) HPI Comments: Patient had flu-like symptoms prior to tonsillectomy, worsening since her tonsillectomy. Fevers up to 101, trismus, throat pain. Odynophagia. No difficulty breathing. Hoarse voice, worsening.   Patient is a 49 y.o. female presenting with URI. The history is provided by the patient.  URI Presenting symptoms: fever and sore throat   Sore throat:    Severity:  Moderate   Onset quality:  Gradual   Duration:  5 days   Timing:  Constant   Progression:  Worsening Severity:  Severe Onset quality:  Gradual Duration:  5 days Timing:  Constant Progression:  Worsening Chronicity:  New Relieved by:  Nothing Ineffective treatments:  None tried Associated symptoms: neck pain and sinus pain   Associated symptoms: no wheezing   Risk factors: recent illness (recent tonsillectomy 5 days ago)     Past Medical History  Diagnosis Date  . Lupus   . Asthma   . GERD (gastroesophageal reflux disease)   . Arthritis   . Anemia    Past Surgical History  Procedure Laterality Date  . Stomach ulcer surgery  2008  . Total hip arthroplasty  06/14/2012    Procedure: TOTAL HIP ARTHROPLASTY ANTERIOR APPROACH;  Surgeon: Mcarthur Rossetti, MD;  Location: [facility];  Service: Orthopedics;  Laterality: Left;  Left Total Hip Arthroplasty, Anterior Approach C-Arm  . Tonsillectomy     Family History  Problem Relation Age of Onset  . Cancer Mother   . Hypertension Mother    History  Substance Use Topics  . Smoking status: Former Smoker -- 0.50 packs/day for 29 years    Types: Cigarettes    Quit date: 06/09/2013  . Smokeless tobacco: Never Used  . Alcohol Use: Yes     Comment: occasionally   OB History   Grav Para  Term Preterm Abortions TAB SAB Ect Mult Living                 Review of Systems  Constitutional: Positive for fever. Negative for chills.  HENT: Positive for sore throat.   Respiratory: Negative for wheezing.   Musculoskeletal: Positive for neck pain.  All other systems reviewed and are negative.    Allergies  Aspirin; Eszopiclone; and Restoril  Home Medications   Current Outpatient Rx  Name  Route  Sig  Dispense  Refill  . albuterol (PROVENTIL HFA;VENTOLIN HFA) 108 (90 BASE) MCG/ACT inhaler   Inhalation   Inhale 2 puffs into the lungs every 6 (six) hours as needed. For shortness of breath   1 Inhaler   0     Refills  By PCP   . bismuth subsalicylate (PEPTO BISMOL) 262 MG chewable tablet   Oral   Chew 524 mg by mouth as needed. For upset stomach         . cetirizine (ZYRTEC) 10 MG tablet   Oral   Take 1 tablet (10 mg total) by mouth daily.   30 tablet   5   . ferrous sulfate 325 (65 FE) MG tablet   Oral   Take 325 mg by mouth daily with breakfast.         . folic acid (FOLVITE) 1 MG tablet   Oral   Take 1 tablet (1 mg total) by mouth  daily.   30 tablet   2   . hydroxychloroquine (PLAQUENIL) 200 MG tablet   Oral   Take 600 mg by mouth daily.         . methocarbamol (ROBAXIN) 500 MG tablet   Oral   Take 500 mg by mouth 4 (four) times daily.         . nicotine (NICODERM CQ - DOSED IN MG/24 HOURS) 14 mg/24hr patch   Transdermal   Place 1 patch onto the skin daily.   30 patch   1   . pantoprazole (PROTONIX) 40 MG tablet   Oral   Take 1 tablet (40 mg total) by mouth every morning.   30 tablet   2   . HYDROcodone-acetaminophen (NORCO/VICODIN) 5-325 MG per tablet   Oral   Take 1 tablet by mouth every 6 (six) hours as needed for moderate pain.   10 tablet   0    BP 140/92  Pulse 93  Temp(Src) 99.3 F (37.4 C) (Oral)  Resp 16  Ht 5\' 4"  (1.626 m)  Wt 190 lb (86.183 kg)  BMI 32.60 kg/m2  SpO2 97% Physical Exam  Nursing note and vitals  reviewed. Constitutional: She is oriented to person, place, and time. She appears well-developed and well-nourished. No distress.  HENT:  Head: Normocephalic and atraumatic.  Mouth/Throat: Normal dentition. Uvula swelling present. No dental caries. Posterior oropharyngeal edema (mild) present. No oropharyngeal exudate, posterior oropharyngeal erythema or tonsillar abscesses.  Plaques along posterior pharynx c/w recent tonsillectomy. No bleeding.  Eyes: EOM are normal. Pupils are equal, round, and reactive to light.  Neck: Normal range of motion. Neck supple. No JVD present. No tracheal deviation present. No thyromegaly present.  Cardiovascular: Normal rate and regular rhythm.  Exam reveals no friction rub.   No murmur heard. Pulmonary/Chest: Effort normal and breath sounds normal. No stridor. No respiratory distress. She has no wheezes. She has no rales.  Abdominal: Soft. She exhibits no distension. There is no tenderness. There is no rebound.  Musculoskeletal: Normal range of motion. She exhibits no edema.  Neurological: She is alert and oriented to person, place, and time.  Skin: She is not diaphoretic.    ED Course  Procedures (including critical care time) Labs Review Labs Reviewed  CBC WITH DIFFERENTIAL - Abnormal; Notable for the following:    Platelets 428 (*)    Eosinophils Relative 6 (*)    All other components within normal limits  BASIC METABOLIC PANEL - Abnormal; Notable for the following:    Glucose, Bld 105 (*)    BUN 5 (*)    All other components within normal limits   Imaging Review No results found.  EKG Interpretation   None       MDM   1. Throat pain in adult    70F 5 days post-op from tonsillectomy. Had flu-like illness prior to surgery, worsening since. Now has odynophagia, hoarse voice, trismus. Fevers up to 101. Patient here with stable vitals, airway intact. Posterior pharynx with plaques c/w recent surgery, no bleeding. Belly benign. TMs clear.  Uvula swollen, no stridor, no airway compromise. Patient admitted by Dr. Simeon Craft.     Osvaldo Shipper, MD 10/12/13 [phone]

## 2013-10-12 NOTE — ED Notes (Addendum)
Presents post tonsillectomy Weds, 2 days ago began having sore throat and last night throat with swelling, inability to swallow without severe pain, mucous and fever of 101. White exudate noted to back of throat. Pt is so much pain unable to open mouth well.  Pt is handling secretions, airway intact.  Throat swollen, secretions noted.

## 2013-10-13 LAB — RESPIRATORY VIRUS PANEL
ADENOVIRUS: NOT DETECTED
INFLUENZA A H1: NOT DETECTED
INFLUENZA A H3: NOT DETECTED
INFLUENZA B 1: NOT DETECTED
Influenza A: NOT DETECTED
Metapneumovirus: NOT DETECTED
Parainfluenza 1: NOT DETECTED
Parainfluenza 2: NOT DETECTED
Parainfluenza 3: NOT DETECTED
RESPIRATORY SYNCYTIAL VIRUS B: NOT DETECTED
Respiratory Syncytial Virus A: NOT DETECTED
Rhinovirus: NOT DETECTED

## 2013-10-13 MED ORDER — PANTOPRAZOLE SODIUM 40 MG PO TBEC
40.0000 mg | DELAYED_RELEASE_TABLET | Freq: Every day | ORAL | Status: DC
  Administered 2013-10-13: 40 mg via ORAL
  Filled 2013-10-13: qty 1

## 2013-10-13 NOTE — Progress Notes (Signed)
Subjective: HD#2 after being admitted for sore throat and dysphagia/odynophagia.  Objective: Vital signs in last 24 hours: Temp:  [97.5 F (36.4 C)-99.3 F (37.4 C)] 97.5 F (36.4 C) (01/26 0500) Pulse Rate:  [86-100] 88 (01/26 0500) Resp:  [16-20] 16 (01/26 0500) BP: (118-140)/(69-96) 124/81 mmHg (01/26 0500) SpO2:  [95 %-97 %] 95 % (01/26 0500) Weight:  [84.006 kg (185 lb 3.2 oz)-86.183 kg (190 lb)] 84.006 kg (185 lb 3.2 oz) (01/25 2110)  Oral cavity shows normal post-op tonsillar fossae eachar with normal mild uvular edema and no signs of infection. Neck supple, trachea midline, normal voice. Took >1255mL of PO  @LABLAST2 (wbc:2,hgb:2,hct:2,plt:2)  Recent Labs  10/12/13 1711  NA 140  K 3.9  CL 101  CO2 24  GLUCOSE 105*  BUN 5*  CREATININE 0.70  CALCIUM 9.2    Medications:  Scheduled Meds: . aminocaproic acid  5 mL Oral QID  . clindamycin (CLEOCIN) IV  600 mg Intravenous Q6H  . dexamethasone  10 mg Intravenous Q8H  . ferrous sulfate  325 mg Oral Q breakfast  . folic acid  1 mg Oral Daily  . hydroxychloroquine  600 mg Oral Daily  . lactobacillus acidophilus & bulgar  1 tablet Oral TID WC  . loratadine  10 mg Oral Daily  . methocarbamol  500 mg Oral QID  . nicotine  14 mg Transdermal Q24H  . nystatin  5 mL Mouth/Throat QID  . oseltamivir  75 mg Oral BID  . pantoprazole (PROTONIX) IV  40 mg Intravenous QHS   Continuous Infusions: . dextrose 5 % and 0.45 % NaCl with KCl 10 mEq/L 100 mL/hr at 10/12/13 2253   PRN Meds:.acetaminophen (TYLENOL) oral liquid 160 mg/5 mL, albuterol, diphenhydrAMINE, diphenhydrAMINE, docusate sodium, HYDROcodone-acetaminophen, morphine injection, ondansetron  Assessment/Plan: HD#2 admitted for post-tonsillectomy pain. Feels better and taking excellent PO. Rx on chart for hydrocodone/acetaminophen and the remaining 4 days of tamiflu liquid. Continue IV clindamycin and Iv decadron for now and can go home when she feels she is ready.   LOS:  1 day   Ruby Cola 10/13/2013, 6:51 AM

## 2013-10-13 NOTE — Discharge Planning (Signed)
Patient discharged home in stable condition. Verbalizes understanding of all discharge instructions, including home medications and follow up appointments. 

## 2013-10-13 NOTE — Discharge Instructions (Signed)
°  Discharge Instructions: No heavy lifting >25lbs for one week, post-tonsillectomy/soft diet as tolerated, Rx on chart for hydrocodone/acetaminophen and the last 4 days of the 5 day course of tamiflu liquid. Resume your PRN zofran and antibiotic that you were already prescribed. follow up with  Dr. Simeon Craft At [facility] in 3-4 weeks

## 2013-10-13 NOTE — Discharge Summary (Signed)
10/13/2013  9:54 AM  Date of Admission:10/12/2013 Date of Discharge:10/13/2013  Discharge KW:IOXB, Alroy Dust, MD  Admitting DZ:HGDJ, Alroy Dust, MD  Reason for admission/final discharge diagnosis:dehydration/throat pain after tonsillectomy  Labs:see EPIC  Procedure(s) performed:none during this admission  Discharge Condition:improved  Discharge Exam:oral cavity with midline uvula with mild edema and tonsillectomy fossae hemostatic with normal eschar, neck supple, voice normal and took >1228mL of PO  Discharge Instructions: No heavy lifting >25lbs for one week, post-tonsillectomy/soft diet as tolerated, Rx on chart for hydrocodone/acetaminophen and the last 4 days of the 5 day course of tamiflu liquid. Resume your PRN zofran and antibiotic that you were already prescribed. follow up with  Dr. Simeon Craft At [facility] in 3-4 weeks  Hospital Course: admitted for dehydration and throat pain after tonsillectomy, improved on IV clindamycin and decadron and PO tamiflu and was able to be discharged in improved condition on HD#2. Her tonsil pathology showed benign lymphoid hyperplasia.  Ruby Cola 9:54 AM 10/13/2013

## 2013-10-25 ENCOUNTER — Other Ambulatory Visit: Payer: Self-pay | Admitting: Oncology

## 2013-10-29 ENCOUNTER — Other Ambulatory Visit (HOSPITAL_BASED_OUTPATIENT_CLINIC_OR_DEPARTMENT_OTHER): Payer: Medicaid Other

## 2013-10-29 ENCOUNTER — Other Ambulatory Visit: Payer: Medicaid Other

## 2013-10-29 ENCOUNTER — Ambulatory Visit (HOSPITAL_BASED_OUTPATIENT_CLINIC_OR_DEPARTMENT_OTHER): Payer: Medicaid Other | Admitting: Oncology

## 2013-10-29 ENCOUNTER — Encounter: Payer: Self-pay | Admitting: Oncology

## 2013-10-29 ENCOUNTER — Ambulatory Visit: Payer: Medicaid Other | Admitting: Oncology

## 2013-10-29 ENCOUNTER — Telehealth: Payer: Self-pay | Admitting: *Deleted

## 2013-10-29 VITALS — BP 136/88 | HR 87 | Temp 98.8°F | Resp 18 | Ht 64.0 in | Wt 192.9 lb

## 2013-10-29 DIAGNOSIS — D693 Immune thrombocytopenic purpura: Secondary | ICD-10-CM

## 2013-10-29 DIAGNOSIS — Z1231 Encounter for screening mammogram for malignant neoplasm of breast: Secondary | ICD-10-CM

## 2013-10-29 DIAGNOSIS — F17200 Nicotine dependence, unspecified, uncomplicated: Secondary | ICD-10-CM

## 2013-10-29 LAB — CBC WITH DIFFERENTIAL/PLATELET
BASO%: 0.4 % (ref 0.0–2.0)
Basophils Absolute: 0 10*3/uL (ref 0.0–0.1)
EOS%: 2.8 % (ref 0.0–7.0)
Eosinophils Absolute: 0.2 10*3/uL (ref 0.0–0.5)
HCT: 37.9 % (ref 34.8–46.6)
HGB: 12.5 g/dL (ref 11.6–15.9)
LYMPH#: 2.7 10*3/uL (ref 0.9–3.3)
LYMPH%: 33.5 % (ref 14.0–49.7)
MCH: 27.2 pg (ref 25.1–34.0)
MCHC: 33 g/dL (ref 31.5–36.0)
MCV: 82.6 fL (ref 79.5–101.0)
MONO#: 0.8 10*3/uL (ref 0.1–0.9)
MONO%: 10.1 % (ref 0.0–14.0)
NEUT#: 4.3 10*3/uL (ref 1.5–6.5)
NEUT%: 53.2 % (ref 38.4–76.8)
PLATELETS: 421 10*3/uL — AB (ref 145–400)
RBC: 4.59 10*6/uL (ref 3.70–5.45)
RDW: 15 % — ABNORMAL HIGH (ref 11.2–14.5)
WBC: 8.1 10*3/uL (ref 3.9–10.3)

## 2013-10-29 MED ORDER — ALBUTEROL SULFATE HFA 108 (90 BASE) MCG/ACT IN AERS: 2.0000 | INHALATION_SPRAY | Freq: Four times a day (QID) | RESPIRATORY_TRACT | Status: DC | PRN

## 2013-10-29 MED ORDER — NICOTINE 14 MG/24HR TD PT24: 14.0000 mg | MEDICATED_PATCH | TRANSDERMAL | Status: DC

## 2013-10-29 NOTE — Patient Instructions (Addendum)
Start nicotine patch and STOP SMOKING today  Dr Marko Plume is glad to see you back any time if you or other doctors have concerns about blood counts

## 2013-10-29 NOTE — Progress Notes (Signed)
OFFICE PROGRESS NOTE   10/29/2013   Physicians:W.Truslow, E.Avbuerre. 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   She had outpatient tonsillectomy by Dr Simeon Craft on 10-08-13, with no bleeding complications, but was hospitalized overnight day 4 for pain and inadequate po intake. She has improved since then, and can already tell improvement in previous tonsillar symptoms. Platelets were 266k in Dec 2014 and 425k on 10-12-13; hemoglobin 13.5 in Dec and 14 on 10-12-13.    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 She was treated then with steroids/ IVIG without improvement, and eventually resolved the thrombocytopenia with Rituxan. 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 Platelet count improved rapidly with short course prednisone Jan 2014. She presented to [facility] ED on 12-15-12 with 5-7 days of bruising and oral bleeding, with platelets <5k, hgb 12.2 and WBC 5.0. She had not been compliant with Plaquenil by Dr Charlestine Night for last 3 month prescription. She was begun on steroids with improvement in platelets up to 47k by 12-18-12. She had iron deficiency documented in Feb 2014.and received IV feraheme during that hospitalization. CBC on 12-24-12 had WBC 12.8 on steroids, Hgb 11.6 and plt up to 164k. With history of AVN on left, steroids were tapered quickly, with prompt drop in platelets. Rituxan was resumed April 2014 with 2 treatments thru 01-24-2013 (mild rate-related rituxan reaction x1) and improvement in platelet count, which has maintained since then. She did not have bleeding complications with tonsillectomy 10-08-13. She  has had no further hemolytic anemia since initial presentation.     Review of systems as above, also: Slight sinus congestion without fever or other symptoms. No bleeding or bruising. No new or different pain. She has resumed smoking ~ 1/2 ppd - discussed, she would like to quit, will use nicotine patches as these allowed her to quit previously. Remainder of 10 point Review of Systems negative.  Objective:  Vital signs in last 24 hours:  BP 136/88  Pulse 87  Temp(Src) 98.8 F (37.1 C) (Oral)  Resp 18  Ht 5\' 4"  (1.626 m)  Wt 192 lb 14.4 oz (87.499 kg)  BMI 33.09 kg/m2  LMP 10/10/2013  Alert, oriented and appropriate. Ambulatory without difficulty.  Weight is up 6 lbs.  HEENT:PERRL, sclerae not icteric. Oral mucosa moist without lesions, posterior pharynx clear, surgical areas seem to be healing. Neck supple. No JVD.  Lymphatics:no cervical,suraclavicular, axillary or inguinal adenopathy Resp: clear to auscultation bilaterally and normal percussion bilaterally Cardio: regular rate and rhythm. No gallop. GI: soft, nontender, not distended, no mass or organomegaly. Normally active bowel sounds.  Musculoskeletal/ Extremities: without pitting edema, cords, tenderness Skin without rash, ecchymosis, petechiae Breasts: without dominant mass, skin or nipple findings. Axillae benign. Psych: appropriate mood and affect. Neuro nonfocal.  Lab Results:  Results for orders placed in visit on 10/29/13  CBC WITH DIFFERENTIAL      Result Value Ref Range   WBC 8.1  3.9 - 10.3 10e3/uL   NEUT# 4.3  1.5 - 6.5 10e3/uL   HGB 12.5  11.6 - 15.9 g/dL   HCT 37.9  34.8 - 46.6 %   Platelets 421 (*)  145 - 400 10e3/uL   MCV 82.6  79.5 - 101.0 fL   MCH 27.2  25.1 - 34.0 pg   MCHC 33.0  31.5 - 36.0 g/dL   RBC 4.59  3.70 - 5.45 10e6/uL   RDW 15.0 (*) 11.2 - 14.5 %   lymph# 2.7  0.9 - 3.3 10e3/uL   MONO# 0.8  0.1 - 0.9 10e3/uL   Eosinophils Absolute 0.2  0.0 - 0.5 10e3/uL   Basophils Absolute 0.0   0.0 - 0.1 10e3/uL   NEUT% 53.2  38.4 - 76.8 %   LYMPH% 33.5  14.0 - 49.7 %   MONO% 10.1  0.0 - 14.0 %   EOS% 2.8  0.0 - 7.0 %   BASO% 0.4  0.0 - 2.0 %     Studies/Results:  PATHOLOGY Collected: 10/08/2013  Accession: [id number] Received: 10/08/2013 Thomes Lolling, MD RT OF SURGICAL PATHOLOGYNAL DIAGNOSIS Diagnosis 1. Tonsil, right - LYMPHOID HYPERPLASIA ASSOCIATED WITH ACUTE INFLAMMATION. 2. Tonsil, left - LYMPHOID HYPERPLASIA ASSOCIATED WITH ACUTE INFLAMMATION.    CT NECK WITH CONTRAST  TECHNIQUE:  Multidetector CT imaging of the neck was performed using the  standard protocol following the bolus administration of intravenous  contrast.  CONTRAST: 10mL OMNIPAQUE IOHEXOL 300 MG/ML SOLN  COMPARISON: None.  FINDINGS:  Post recent palatine had tonsillectomy. Gas and fluid with  surrounding hyperemia seen at the tonsillectomy site. It is possible  this represents expected postoperative changes although superimposed  infection not excluded given the patient's history.  Gas is seen medial to the left parotid gland extending into the left  temporomandibular joint. Small amount of gas posterior lateral to  the left maxillary sinus and trapped by the left parotid duct. The  origin of this gas is indeterminate. Although there is no evidence  of fluid in the parapharyngeal space, the presence of gas in recent  postoperative state raises possibility of a pinpoint leak with  escape of gas.  Prominence of lymphoid tissue of Waldeyer's ring. Adenopathy  throughout the neck largest in the level 2 region measuring up to  2.1 x 1.3 cm. Left lateral retropharyngeal noted is enlarged.  Inflamed appearing uvula. These findings are suggestive of  infection.  Small amount of fluid in the posterior superior nasopharynx may  represent pooled secretions without drainable abscess.  Cervical spondylotic changes most notable C5-6.  Lung apices clear.  Moderate mucosal thickening maxillary  sinuses. Almost complete  opacification visualized ethmoid sinus air cells. No obvious orbital  extension.  No evidence of septic thrombophlebitis.  Medial displacement common carotid arteries bilaterally.  IMPRESSION:  Post recent palatine had tonsillectomy. Gas and fluid with  surrounding hyperemia seen at the tonsillectomy site. It is possible  this represents expected postoperative changes although superimposed  infection not excluded given the patient's history.  Gas is seen medial to the left parotid gland extending into the left  temporomandibular joint. Small amount of gas posterior lateral to  the left maxillary sinus and trapped by the left parotid duct. The  origin of this gas is indeterminate. Although there is no evidence  of fluid in the parapharyngeal space, the presence of gas in recent  postoperative state raises possibility of a pinpoint leak with  escape of gas.  Prominence of lymphoid tissue of Waldeyer's ring. Adenopathy  throughout the neck largest in the level 2 region measuring up to  2.1 x 1.3 cm. Left lateral retropharyngeal noted is enlarged.  Inflamed appearing uvula. These findings are suggestive of  infection.  Moderate mucosal thickening maxillary sinuses. Almost complete  opacification visualized ethmoid sinus air cells.    Medications: I have reviewed the patient's current medications. We will do one refill on her Albuterol inhaler to get her to PCP appointment in late Feb. Will request 14 mg nicotine patches.  DISCUSSION: patient knows to contact physician if unusual bruising or bleeding. I will see her back at any time if she or other physicians request, but with counts stable in good range now, I have not made scheduled return appointment.  Assessment/Plan: 1. 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 Good response to brief course Rituxan spring 2014. I am glad to see her back at any time if concerns. .2.SLE  followed by Dr Charlestine Night, continuing Plaquenil.  3.prolonged, severe issues with recurrent sinusitis, tonsillitis, bronchitis since I have known her: now post tonsillectomy Jan 2015.  4. Long cigarette use, resumed but again wants to quit. Disucssed, encouraged cessation especially with respiratory issues. Nicotine patches. 5.post left hip replacement for avascular necrosis, fall 2013. She is at risk for similar problems right hip if steroids required in future  6. Iron deficiency anemia by labs Feb 2014: post IV feraheme 12-17-12.Hgb and MCV normal now.  69. Death of 35 yo son in [place] 2013-03-30  8.flu vaccine done   Patient is in agreement with discussion and plan.     Tramond Slinker P, MD   10/29/2013, 12:21 PM

## 2013-10-29 NOTE — Telephone Encounter (Signed)
appts made and printed...td 

## 2013-11-05 ENCOUNTER — Encounter (HOSPITAL_COMMUNITY): Payer: Self-pay | Admitting: Emergency Medicine

## 2013-11-05 ENCOUNTER — Emergency Department (HOSPITAL_COMMUNITY)
Admission: EM | Admit: 2013-11-05 | Discharge: 2013-11-05 | Disposition: A | Discharge status: Home / Self Care | Payer: Medicaid Other | Attending: Emergency Medicine | Admitting: Emergency Medicine

## 2013-11-05 ENCOUNTER — Emergency Department (HOSPITAL_COMMUNITY): Payer: Medicaid Other

## 2013-11-05 DIAGNOSIS — D649 Anemia, unspecified: Secondary | ICD-10-CM | POA: Insufficient documentation

## 2013-11-05 DIAGNOSIS — R142 Eructation: Secondary | ICD-10-CM | POA: Insufficient documentation

## 2013-11-05 DIAGNOSIS — Z79899 Other long term (current) drug therapy: Secondary | ICD-10-CM | POA: Insufficient documentation

## 2013-11-05 DIAGNOSIS — R143 Flatulence: Secondary | ICD-10-CM

## 2013-11-05 DIAGNOSIS — R3915 Urgency of urination: Secondary | ICD-10-CM | POA: Insufficient documentation

## 2013-11-05 DIAGNOSIS — Z87891 Personal history of nicotine dependence: Secondary | ICD-10-CM | POA: Insufficient documentation

## 2013-11-05 DIAGNOSIS — R350 Frequency of micturition: Secondary | ICD-10-CM | POA: Insufficient documentation

## 2013-11-05 DIAGNOSIS — Z3202 Encounter for pregnancy test, result negative: Secondary | ICD-10-CM | POA: Insufficient documentation

## 2013-11-05 DIAGNOSIS — R109 Unspecified abdominal pain: Secondary | ICD-10-CM

## 2013-11-05 DIAGNOSIS — R197 Diarrhea, unspecified: Secondary | ICD-10-CM | POA: Insufficient documentation

## 2013-11-05 DIAGNOSIS — R141 Gas pain: Secondary | ICD-10-CM | POA: Insufficient documentation

## 2013-11-05 DIAGNOSIS — R112 Nausea with vomiting, unspecified: Secondary | ICD-10-CM | POA: Insufficient documentation

## 2013-11-05 DIAGNOSIS — R300 Dysuria: Secondary | ICD-10-CM | POA: Insufficient documentation

## 2013-11-05 DIAGNOSIS — J45909 Unspecified asthma, uncomplicated: Secondary | ICD-10-CM | POA: Insufficient documentation

## 2013-11-05 DIAGNOSIS — M129 Arthropathy, unspecified: Secondary | ICD-10-CM | POA: Insufficient documentation

## 2013-11-05 DIAGNOSIS — Z87440 Personal history of urinary (tract) infections: Secondary | ICD-10-CM | POA: Insufficient documentation

## 2013-11-05 DIAGNOSIS — Z8739 Personal history of other diseases of the musculoskeletal system and connective tissue: Secondary | ICD-10-CM | POA: Insufficient documentation

## 2013-11-05 DIAGNOSIS — Z9889 Other specified postprocedural states: Secondary | ICD-10-CM | POA: Insufficient documentation

## 2013-11-05 DIAGNOSIS — D259 Leiomyoma of uterus, unspecified: Secondary | ICD-10-CM

## 2013-11-05 DIAGNOSIS — K219 Gastro-esophageal reflux disease without esophagitis: Secondary | ICD-10-CM | POA: Insufficient documentation

## 2013-11-05 LAB — COMPREHENSIVE METABOLIC PANEL
ALBUMIN: 3.2 g/dL — AB (ref 3.5–5.2)
ALT: 12 U/L (ref 0–35)
AST: 11 U/L (ref 0–37)
Alkaline Phosphatase: 71 U/L (ref 39–117)
BUN: 9 mg/dL (ref 6–23)
CO2: 23 mEq/L (ref 19–32)
Calcium: 9 mg/dL (ref 8.4–10.5)
Chloride: 100 mEq/L (ref 96–112)
Creatinine, Ser: 0.61 mg/dL (ref 0.50–1.10)
GFR calc Af Amer: >90 mL/min (ref >90)
GFR calc non Af Amer: >90 mL/min (ref >90)
Glucose, Bld: 99 mg/dL (ref 70–99)
POTASSIUM: 4.2 meq/L (ref 3.7–5.3)
Sodium: 137 mEq/L (ref 137–147)
Total Protein: 6.9 g/dL (ref 6.0–8.3)

## 2013-11-05 LAB — URINALYSIS, ROUTINE W REFLEX MICROSCOPIC
BILIRUBIN URINE: NEGATIVE
GLUCOSE, UA: NEGATIVE mg/dL
Ketones, ur: NEGATIVE mg/dL
Nitrite: NEGATIVE
PH: 7 (ref 5.0–8.0)
Protein, ur: NEGATIVE mg/dL
Specific Gravity, Urine: 1.019 (ref 1.005–1.030)
Urobilinogen, UA: 0.2 mg/dL (ref 0.0–1.0)

## 2013-11-05 LAB — CBC
HEMATOCRIT: 38.8 % (ref 36.0–46.0)
Hemoglobin: 13.1 g/dL (ref 12.0–15.0)
MCH: 28.2 pg (ref 26.0–34.0)
MCHC: 33.8 g/dL (ref 30.0–36.0)
MCV: 83.4 fL (ref 78.0–100.0)
Platelets: 405 10*3/uL — ABNORMAL HIGH (ref 150–400)
RBC: 4.65 MIL/uL (ref 3.87–5.11)
RDW: 15.5 % (ref 11.5–15.5)
WBC: 11.9 10*3/uL — ABNORMAL HIGH (ref 4.0–10.5)

## 2013-11-05 LAB — POCT PREGNANCY, URINE: Preg Test, Ur: NEGATIVE

## 2013-11-05 LAB — URINE MICROSCOPIC-ADD ON

## 2013-11-05 LAB — LIPASE, BLOOD: Lipase: 33 U/L (ref 11–59)

## 2013-11-05 MED ORDER — MORPHINE SULFATE 4 MG/ML IJ SOLN
4.0000 mg | Freq: Once | INTRAMUSCULAR | Status: AC
  Administered 2013-11-05: 4 mg via INTRAVENOUS
  Filled 2013-11-05: qty 1

## 2013-11-05 MED ORDER — HYDROCODONE-ACETAMINOPHEN 5-325 MG PO TABS: 1.0000 | ORAL_TABLET | ORAL | Status: DC | PRN

## 2013-11-05 MED ORDER — ONDANSETRON HCL 4 MG/2ML IJ SOLN
4.0000 mg | Freq: Once | INTRAMUSCULAR | Status: AC
  Administered 2013-11-05: 4 mg via INTRAVENOUS
  Filled 2013-11-05: qty 2

## 2013-11-05 MED ORDER — DICYCLOMINE HCL 10 MG/ML IM SOLN
20.0000 mg | Freq: Once | INTRAMUSCULAR | Status: DC
  Filled 2013-11-05: qty 2

## 2013-11-05 MED ORDER — DICYCLOMINE HCL 10 MG PO CAPS
10.0000 mg | ORAL_CAPSULE | Freq: Once | ORAL | Status: AC
  Administered 2013-11-05: 10 mg via ORAL
  Filled 2013-11-05: qty 1

## 2013-11-05 MED ORDER — SODIUM CHLORIDE 0.9 % IV BOLUS (SEPSIS)
1000.0000 mL | Freq: Once | INTRAVENOUS | Status: AC
  Administered 2013-11-05: 1000 mL via INTRAVENOUS

## 2013-11-05 MED ORDER — IOHEXOL 300 MG/ML  SOLN: 25.0000 mL | Freq: Once | INTRAMUSCULAR | Status: DC | PRN

## 2013-11-05 MED ORDER — HYDROMORPHONE HCL PF 1 MG/ML IJ SOLN
1.0000 mg | Freq: Once | INTRAMUSCULAR | Status: AC
  Administered 2013-11-05: 1 mg via INTRAVENOUS
  Filled 2013-11-05: qty 1

## 2013-11-05 NOTE — ED Provider Notes (Signed)
CSN: [id number]     Arrival date & time 11/05/13  7829 History   First MD Initiated Contact with Patient 11/05/13 [phone]     Chief Complaint  Patient presents with  . Abdominal Pain     (Consider location/radiation/quality/duration/timing/severity/associated sxs/prior Treatment) HPI Comments: Patient is a 48 year old female with a past medical history of lupus, asthma, GERD and anemia who presents to the emergency department complaining of abdominal pain x2 days. Patient states she was laying around and suddenly developed lower abdominal pain described as cramping and pressure, constant, severe. States her abdomen feels distended. She had one episode of nonbloody emesis and diarrhea this morning, she is very nauseated. States she feels as if she is "having a misscarriage" however knows she is not. Denies vaginal bleeding or d/c. Denies fever, chills, chest pain. Hx of hernia surgery in 2009. She was treated for a UTI 2 weeks ago and did not take prescribed antibiotic. Admits to increased urinary frequency, urgency and dysuria. No back pain.  Patient is a 48 y.o. female presenting with abdominal pain. The history is provided by the patient and the spouse.  Abdominal Pain Associated symptoms: diarrhea, dysuria, nausea and vomiting     Past Medical History  Diagnosis Date  . Lupus   . Asthma   . GERD (gastroesophageal reflux disease)   . Arthritis   . Anemia    Past Surgical History  Procedure Laterality Date  . Stomach ulcer surgery  2008  . Total hip arthroplasty  06/14/2012    Procedure: TOTAL HIP ARTHROPLASTY ANTERIOR APPROACH;  Surgeon: Mcarthur Rossetti, MD;  Location: [facility]S;  Service: Orthopedics;  Laterality: Left;  Left Total Hip Arthroplasty, Anterior Approach C-Arm  . Tonsillectomy    . Tonsillectomy     Family History  Problem Relation Age of Onset  . Cancer Mother   . Hypertension Mother    History  Substance Use Topics  . Smoking status: Former Smoker -- 0.50  packs/day for 29 years    Types: Cigarettes    Quit date: 10/08/2013  . Smokeless tobacco: Never Used  . Alcohol Use: No     Comment: occasionally   OB History   Grav Para Term Preterm Abortions TAB SAB Ect Mult Living                 Review of Systems  Gastrointestinal: Positive for nausea, vomiting, abdominal pain, diarrhea and abdominal distention.  Genitourinary: Positive for dysuria, urgency and frequency.  All other systems reviewed and are negative.      Allergies  Aspirin; Eszopiclone; and Restoril  Home Medications   Current Outpatient Rx  Name  Route  Sig  Dispense  Refill  . albuterol (PROVENTIL HFA;VENTOLIN HFA) 108 (90 BASE) MCG/ACT inhaler   Inhalation   Inhale 2 puffs into the lungs every 6 (six) hours as needed. For shortness of breath   1 Inhaler   0     Refills  By PCP   . bismuth subsalicylate (PEPTO BISMOL) 262 MG chewable tablet   Oral   Chew 524 mg by mouth as needed. For upset stomach         . cetirizine (ZYRTEC) 10 MG tablet   Oral   Take 1 tablet (10 mg total) by mouth daily.   30 tablet   5   . ferrous sulfate 325 (65 FE) MG tablet   Oral   Take 325 mg by mouth daily with breakfast.         .  folic acid (FOLVITE) 1 MG tablet   Oral   Take 1 tablet (1 mg total) by mouth daily.   30 tablet   2   . HYDROcodone-acetaminophen (NORCO/VICODIN) 5-325 MG per tablet   Oral   Take 1 tablet by mouth every 6 (six) hours as needed for moderate pain.   10 tablet   0   . hydroxychloroquine (PLAQUENIL) 200 MG tablet   Oral   Take 600 mg by mouth daily.         . methocarbamol (ROBAXIN) 500 MG tablet   Oral   Take 500 mg by mouth 4 (four) times daily.         . nicotine (NICODERM CQ - DOSED IN MG/24 HOURS) 14 mg/24hr patch   Transdermal   Place 1 patch (14 mg total) onto the skin daily.   30 patch   1   . pantoprazole (PROTONIX) 40 MG tablet   Oral   Take 1 tablet (40 mg total) by mouth every morning.   30 tablet   2     BP 121/79  Pulse 98  Temp(Src) 98.9 F (37.2 C) (Oral)  Resp 17  Wt 193 lb (87.544 kg)  SpO2 98%  LMP 10/10/2013 Physical Exam  Nursing note and vitals reviewed. Constitutional: She is oriented to person, place, and time. She appears well-developed and well-nourished. No distress.  HENT:  Head: Normocephalic and atraumatic.  Mouth/Throat: Oropharynx is clear and moist.  Eyes: Conjunctivae are normal. No scleral icterus.  Neck: Normal range of motion. Neck supple.  Cardiovascular: Normal rate, regular rhythm and normal heart sounds.   Pulmonary/Chest: Effort normal and breath sounds normal.  Abdominal: Soft. Normal appearance and bowel sounds are normal. She exhibits no distension. There is generalized tenderness. There is guarding. There is no rigidity, no rebound and no CVA tenderness.  Generalized tenderness, worse suprapubic with guarding. No peritoneal signs.  Genitourinary: Uterus is tender. Cervix exhibits no motion tenderness, no discharge and no friability. Right adnexum displays tenderness. Right adnexum displays no mass and no fullness. Left adnexum displays tenderness. Left adnexum displays no mass and no fullness. There is bleeding (dark red, consistent with menstrual blood) around the vagina. No tenderness around the vagina.  Musculoskeletal: Normal range of motion. She exhibits no edema.  Neurological: She is alert and oriented to person, place, and time.  Skin: Skin is warm and dry. She is not diaphoretic.  Psychiatric: She has a normal mood and affect. Her behavior is normal.    ED Course  Procedures (including critical care time) Labs Review Labs Reviewed  CBC - Abnormal; Notable for the following:    WBC 11.9 (*)    Platelets 405 (*)    All other components within normal limits  COMPREHENSIVE METABOLIC PANEL - Abnormal; Notable for the following:    Albumin 3.2 (*)    Total Bilirubin <0.2 (*)    All other components within normal limits  URINALYSIS,  ROUTINE W REFLEX MICROSCOPIC - Abnormal; Notable for the following:    APPearance HAZY (*)    Hgb urine dipstick MODERATE (*)    Leukocytes, UA SMALL (*)    All other components within normal limits  URINE MICROSCOPIC-ADD ON - Abnormal; Notable for the following:    Squamous Epithelial / LPF FEW (*)    Bacteria, UA FEW (*)    All other components within normal limits  LIPASE, BLOOD  POCT PREGNANCY, URINE   Imaging Review US Transvaginal Non-ob  11/05/2013  CLINICAL DATA:  Abdominal pain  EXAM: TRANSABDOMINAL AND TRANSVAGINAL ULTRASOUND OF PELVIS  TECHNIQUE: Both transabdominal and transvaginal ultrasound examinations of the pelvis were performed. Transabdominal technique was performed for global imaging of the pelvis including uterus, ovaries, adnexal regions, and pelvic cul-de-sac. It was necessary to proceed with endovaginal exam following the transabdominal exam to visualize the ovaries and adnexae.  COMPARISON:  09/03/2010 pelvic ultrasound and 08/26/2010 CT abdomen/pelvis  FINDINGS: Uterus  Measurements: 9.1 x 4.8 x 4.7 cm. Retroflexed. Small submucosal leiomyoma upper uterine segment posteriorly 13 x 12 x 14 mm.  Endometrium  Thickness: 11 mm thick, normal. No endometrial fluid or focal abnormality  Right ovary  Measurements: 2.0 x 1.3 x 1.1 cm. Normal morphology without mass.  Left ovary  Measurements: 2.1 x 0.9 x 1.8 cm. Normal morphology without mass.  Other findings  Fairly homogeneous left adnexal mass identified 3.4 x 1.9 x 2.8 cm, abutting the uterus, lying near but appearing separate from left ovary.  This could represent an exophytic uterine leiomyoma or a nonspecific solid left adnexal mass.  On prior CT, there is questionably an exophytic left uterine mass 2.7 x 2.4 cm series 3, image 65, though this is subserosal in not definitely exophytic/pedunculated.  Small amount of simple appearing free pelvic fluid.  IMPRESSION: Retroflexed uterus containing a small submucosal leiomyoma 14 mm  diameter at upper uterine segment posteriorly.  Solid appearing left adnexal mass 3.4 x 1.9 x 2.8 cm, potentially an exophytic leiomyoma arising from the left lateral aspect of the uterus though this is not definitive and other etiologies are not excluded ; questionable sub serosal left ureter mass on prior CT, somewhat different in appearance.  Tumor not excluded.  Recommend MR imaging of the pelvis to further characterize and localize the origin of this mass.   Electronically Signed   By: Lavonia Dana M.D.   On: 11/05/2013 12:05   US Pelvis Complete  11/05/2013   CLINICAL DATA:  Abdominal pain  EXAM: TRANSABDOMINAL AND TRANSVAGINAL ULTRASOUND OF PELVIS  TECHNIQUE: Both transabdominal and transvaginal ultrasound examinations of the pelvis were performed. Transabdominal technique was performed for global imaging of the pelvis including uterus, ovaries, adnexal regions, and pelvic cul-de-sac. It was necessary to proceed with endovaginal exam following the transabdominal exam to visualize the ovaries and adnexae.  COMPARISON:  09/03/2010 pelvic ultrasound and 08/26/2010 CT abdomen/pelvis  FINDINGS: Uterus  Measurements: 9.1 x 4.8 x 4.7 cm. Retroflexed. Small submucosal leiomyoma upper uterine segment posteriorly 13 x 12 x 14 mm.  Endometrium  Thickness: 11 mm thick, normal. No endometrial fluid or focal abnormality  Right ovary  Measurements: 2.0 x 1.3 x 1.1 cm. Normal morphology without mass.  Left ovary  Measurements: 2.1 x 0.9 x 1.8 cm. Normal morphology without mass.  Other findings  Fairly homogeneous left adnexal mass identified 3.4 x 1.9 x 2.8 cm, abutting the uterus, lying near but appearing separate from left ovary.  This could represent an exophytic uterine leiomyoma or a nonspecific solid left adnexal mass.  On prior CT, there is questionably an exophytic left uterine mass 2.7 x 2.4 cm series 3, image 65, though this is subserosal in not definitely exophytic/pedunculated.  Small amount of simple appearing  free pelvic fluid.  IMPRESSION: Retroflexed uterus containing a small submucosal leiomyoma 14 mm diameter at upper uterine segment posteriorly.  Solid appearing left adnexal mass 3.4 x 1.9 x 2.8 cm, potentially an exophytic leiomyoma arising from the left lateral aspect of the uterus though this is  not definitive and other etiologies are not excluded ; questionable sub serosal left ureter mass on prior CT, somewhat different in appearance.  Tumor not excluded.  Recommend MR imaging of the pelvis to further characterize and localize the origin of this mass.   Electronically Signed   By: Lavonia Dana M.D.   On: 11/05/2013 12:05    EKG Interpretation   None       MDM   Final diagnoses:  Leiomyoma of uterus  Abdominal pain   Pt presenting with abdominal pain, n/v/d, urinary symptoms. She appears in NAD, normal VS. Afebrile. Labs, urine pending. 10:40 AM Labs showing mild leukocytosis of 11.9, otherwise no significant finding. Urinalysis showing few squamous cells, 3-6 white blood cells, small leukocytes, few bacteria and nitrite negative. Doubt symptoms are from UTI. After receiving the Bentyl and Zofran, patient reports there is no change in her pain, abdomen still very tender with guarding. She reports she is now having vaginal bleeding, unsure if it is her period. LMP began Jan 13. Will do pelvic exam and obtain pelvic US. 12:26 PM Pelvic ultrasound showing retroflexed uterus containing a small submucosal leiomyoma, followed left appearing adnexal mass, exophytic leiomyoma arising from the left lateral aspect of the uterus, cannot exclude tumor. I discussed these findings with patient, she has a gynecologist Dr. Ruthann Cancer who she will call as soon as possible to schedule an appointment. Pain improved in the emergency department. On repeat abdominal exam, abdomen still tender, however improvement from initial exam. She is stable for discharge, will discharge with Vicodin, follow up with gynecologist.  Return precautions given. Patient states understanding of treatment care plan and is agreeable.   Illene Labrador, PA-C 11/05/13 1227

## 2013-11-05 NOTE — ED Notes (Signed)
Pt states abd pain started yesterday morning. Pt started having diarrhea, nausea, vomiting this morning. Pt states the pain feels like she is "having a miscarriage." Pt has hx of lupus.

## 2013-11-05 NOTE — ED Provider Notes (Signed)
Medical screening examination/treatment/procedure(s) were performed by non-physician practitioner and as supervising physician I was immediately available for consultation/collaboration.  EKG Interpretation   None         Hoy Morn, MD 11/05/13 1710

## 2013-11-05 NOTE — ED Notes (Signed)
PA at bedside with pelvic cart.

## 2013-11-05 NOTE — Discharge Instructions (Signed)
It is very important for you to followup with your gynecologist. Take Vicodin for severe pain only. No driving or operating heavy machinery while taking vicodin. This medication may cause drowsiness.  Uterine Fibroid A uterine fibroid is a growth (tumor) that occurs in your uterus. This type of tumor is not cancerous and does not spread out of the uterus. You can have one or many fibroids. Fibroids can vary in size, weight, and where they grow in the uterus. Some can become quite large. Most fibroids do not require medical treatment, but some can cause pain or heavy bleeding during and between periods. CAUSES  A fibroid is the result of a single uterine cell that keeps growing (unregulated), which is different than most cells in the human body. Most cells have a control mechanism that keeps them from reproducing without control.  SIGNS AND SYMPTOMS   Bleeding.  Pelvic pain and pressure.  Bladder problems due to the size of the fibroid.  Infertility and miscarriages depending on the size and location of the fibroid. DIAGNOSIS  Uterine fibroids are diagnosed through a physical exam. Your health care provider may feel the lumpy tumors during a pelvic exam. Ultrasonography may be done to get information regarding size, location, and number of tumors.  TREATMENT   Your health care provider may recommend watchful waiting. This involves getting the fibroid checked by your health care provider to see if it grows or shrinks.   Hormone treatment or an intrauterine device (IUD) may be prescribed.   Surgery may be needed to remove the fibroids (myomectomy) or the uterus (hysterectomy). This depends on your situation. When fibroids interfere with fertility and a woman wants to become pregnant, a health care provider may recommend having the fibroids removed.  [place] care depends on how you were treated. In general:   Keep all follow-up appointments with your health care  provider.   Only take over-the-counter or prescription medicines as directed by your health care provider. If you were prescribed a hormone treatment, take the hormone medicines exactly as directed. Do not take aspirin. It can cause bleeding.   Talk to your health care provider about taking iron pills.  If your periods are troublesome but not so heavy, lie down with your feet raised slightly above your heart. Place cold packs on your lower abdomen.   If your periods are heavy, write down the number of pads or tampons you use per month. Bring this information to your health care provider.   Include green vegetables in your diet.  SEEK IMMEDIATE MEDICAL CARE IF:  You have pelvic pain or cramps not controlled with medicines.   You have a sudden increase in pelvic pain.   You have an increase in bleeding between and during periods.   You have excessive periods and soak tampons or pads in a half hour or less.  You feel lightheaded or have fainting episodes. Document Released: 09/01/2000 Document Revised: 06/25/2013 Document Reviewed: 04/03/2013 [facility] Patient Information 2014 [place], [place].

## 2013-11-06 IMAGING — CR DG HIP COMPLETE 2+V*R*
2 series · 2 of 2 positions shown · non-contrast
Comparison: None.

CLINICAL DATA: Right groin pain.  Right hip pain.  Hip degeneration
associated with steroids.

RIGHT HIP - COMPLETE 2+ VIEW

[view not recorded (1 of 2)]
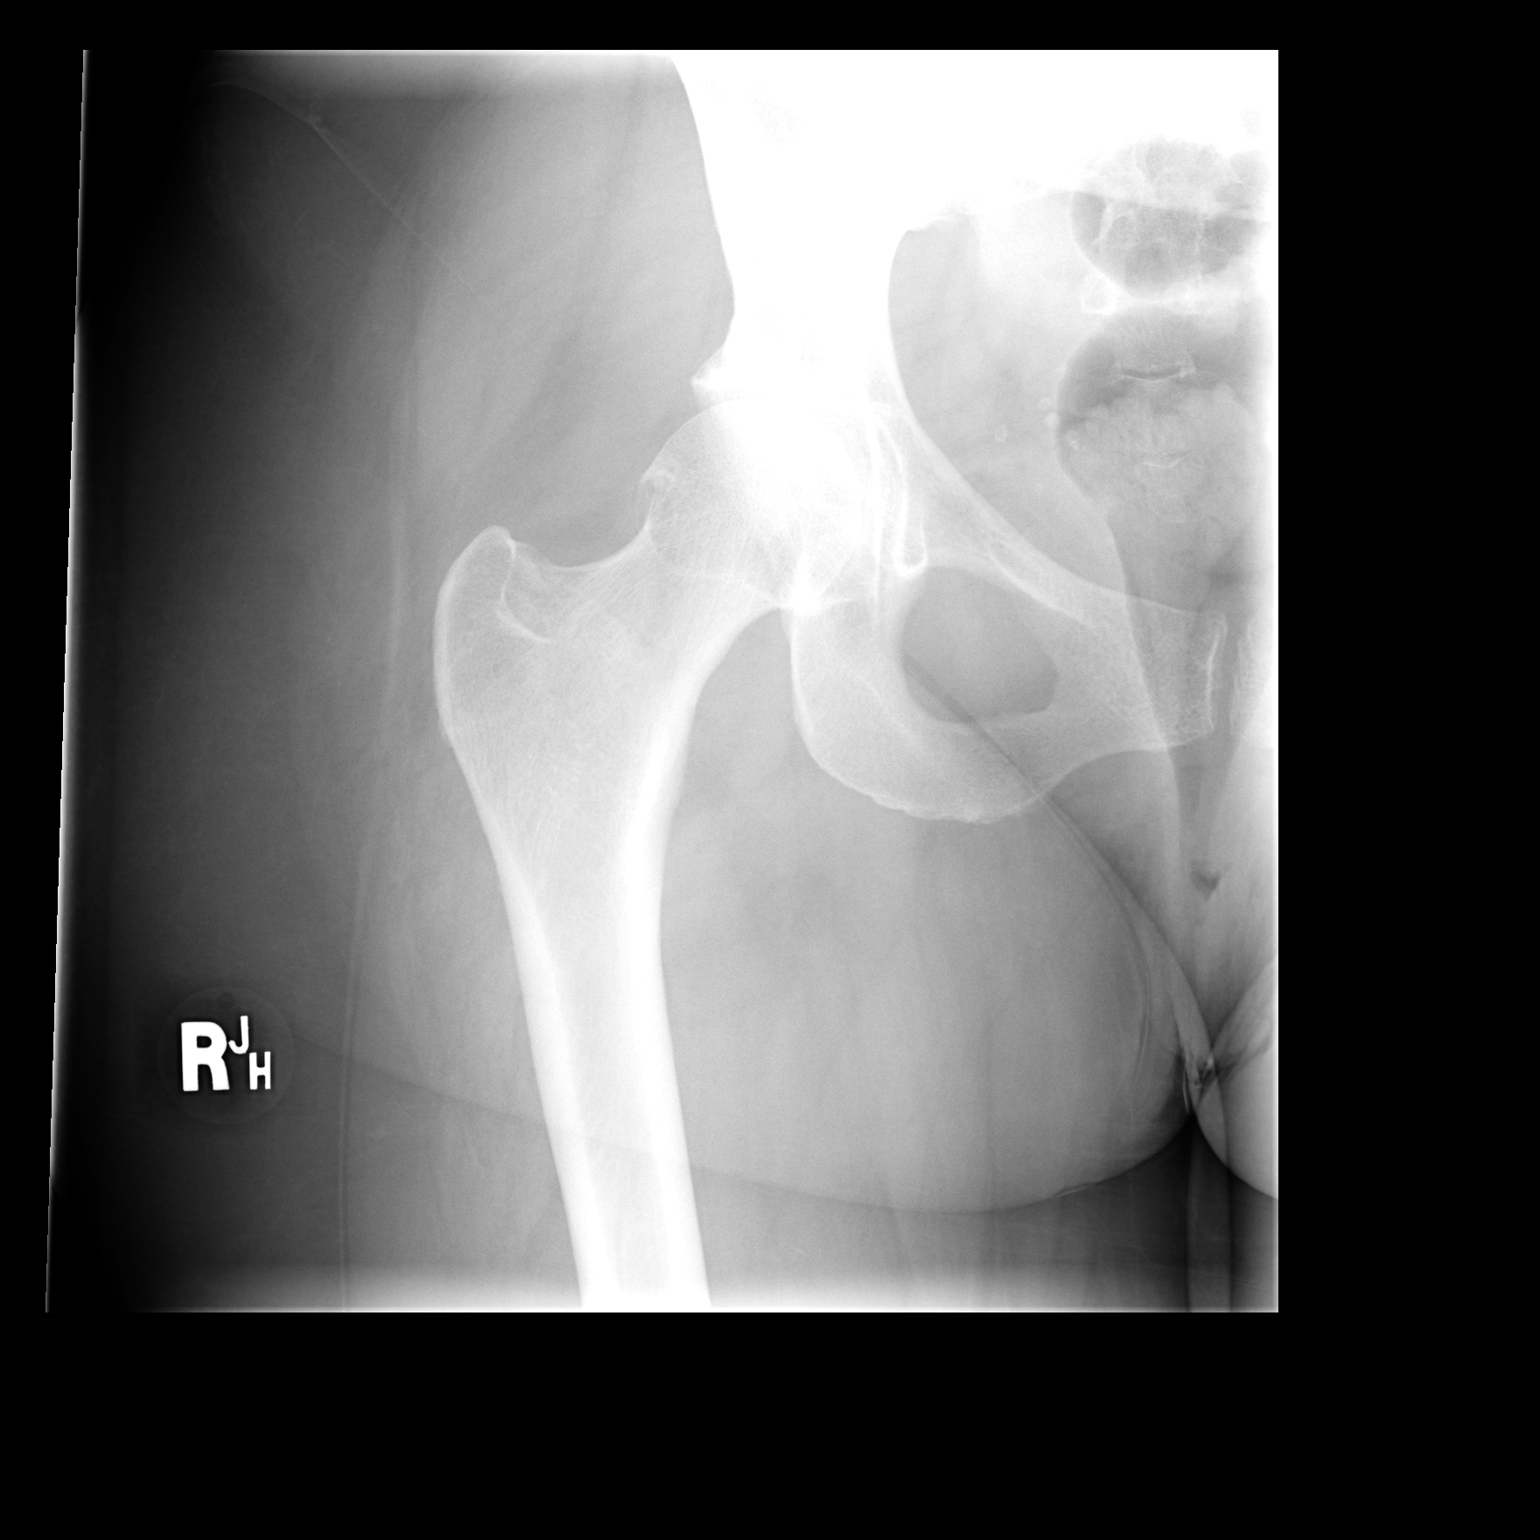

[view not recorded (2 of 2)]
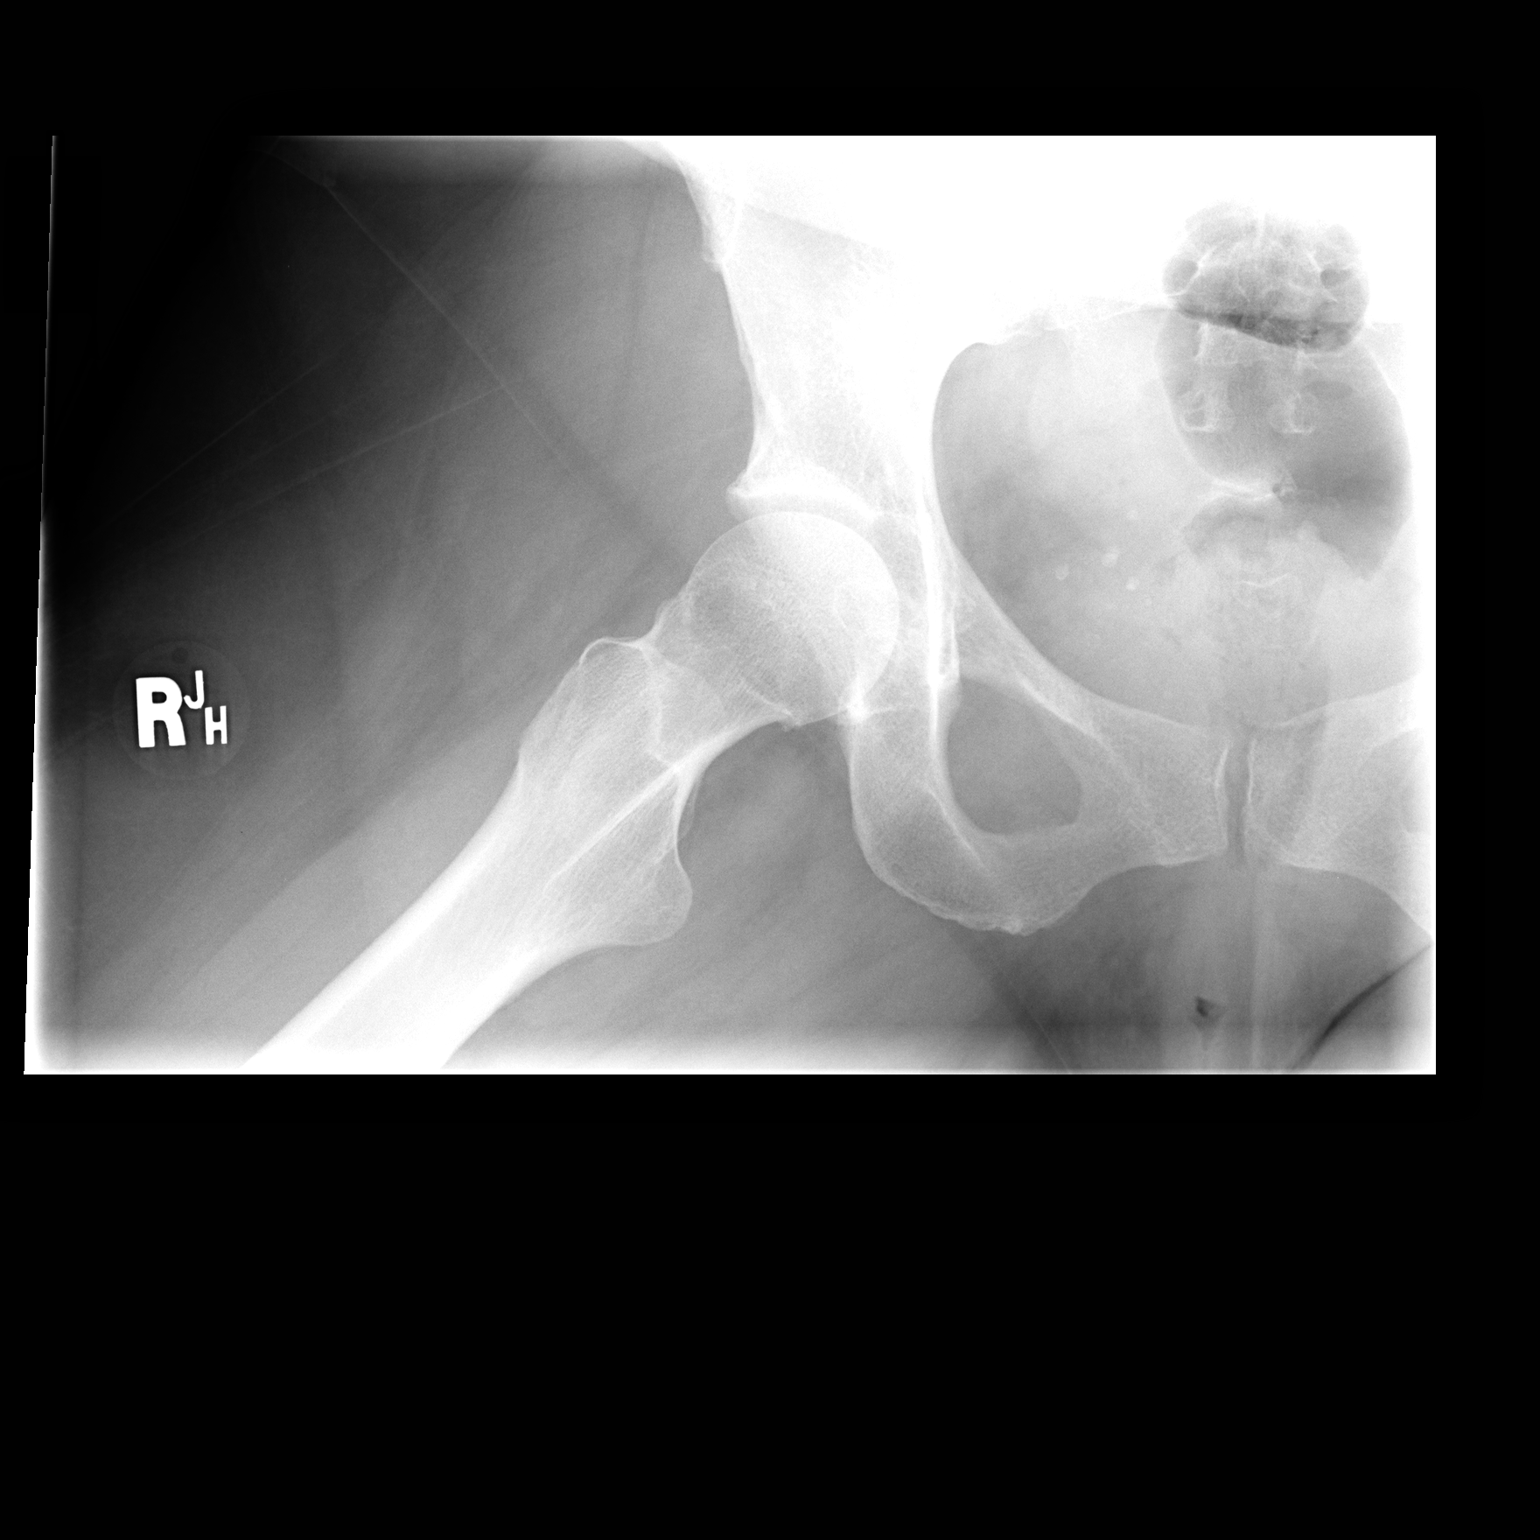

[2 of 2 positions shown; findings below may reference images not displayed]

FINDINGS: Age advanced mild to moderate right hip osteoarthritis is
present with femoral head osteophytes.  The superior joint space
appears preserved and there is no axial migration typically
associated with inflammatory arthropathy.  No AVN or femoral head
collapse.  The obturator rings appear within normal limits.
IMPRESSION: Age advanced right hip osteoarthritis.  No evidence of AVN.

## 2013-11-10 ENCOUNTER — Other Ambulatory Visit (HOSPITAL_COMMUNITY): Payer: Self-pay | Admitting: Obstetrics

## 2013-11-10 DIAGNOSIS — N9489 Other specified conditions associated with female genital organs and menstrual cycle: Secondary | ICD-10-CM

## 2013-11-18 ENCOUNTER — Ambulatory Visit: Payer: Medicaid Other

## 2013-11-24 ENCOUNTER — Ambulatory Visit (HOSPITAL_COMMUNITY)
Admission: RE | Admit: 2013-11-24 | Discharge: 2013-11-24 | Disposition: A | Discharge status: Home / Self Care | Payer: Medicaid Other | Source: Ambulatory Visit | Attending: Obstetrics | Admitting: Obstetrics

## 2013-12-10 ENCOUNTER — Ambulatory Visit (HOSPITAL_COMMUNITY): Payer: Medicaid Other | Attending: Obstetrics

## 2013-12-10 ENCOUNTER — Ambulatory Visit (HOSPITAL_COMMUNITY): Admission: RE | Admit: 2013-12-10 | Payer: Medicaid Other | Source: Ambulatory Visit

## 2014-03-18 ENCOUNTER — Ambulatory Visit: Payer: Medicaid Other

## 2014-04-30 ENCOUNTER — Ambulatory Visit: Payer: Medicaid Other

## 2014-05-06 ENCOUNTER — Ambulatory Visit: Payer: Medicaid Other | Attending: Anesthesiology

## 2014-05-06 DIAGNOSIS — IMO0001 Reserved for inherently not codable concepts without codable children: Secondary | ICD-10-CM | POA: Insufficient documentation

## 2014-05-06 DIAGNOSIS — M545 Low back pain, unspecified: Secondary | ICD-10-CM | POA: Insufficient documentation

## 2014-05-06 DIAGNOSIS — Z96649 Presence of unspecified artificial hip joint: Secondary | ICD-10-CM | POA: Insufficient documentation

## 2014-05-06 DIAGNOSIS — M25559 Pain in unspecified hip: Secondary | ICD-10-CM | POA: Diagnosis not present

## 2014-05-06 DIAGNOSIS — M329 Systemic lupus erythematosus, unspecified: Secondary | ICD-10-CM | POA: Insufficient documentation

## 2014-05-20 ENCOUNTER — Encounter: Payer: Medicaid Other | Admitting: Physical Therapy

## 2014-05-26 ENCOUNTER — Ambulatory Visit: Payer: Medicaid Other | Attending: Anesthesiology | Admitting: Physical Therapy

## 2014-05-26 DIAGNOSIS — M545 Low back pain, unspecified: Secondary | ICD-10-CM | POA: Insufficient documentation

## 2014-05-26 DIAGNOSIS — M329 Systemic lupus erythematosus, unspecified: Secondary | ICD-10-CM | POA: Diagnosis not present

## 2014-05-26 DIAGNOSIS — M25559 Pain in unspecified hip: Secondary | ICD-10-CM | POA: Diagnosis not present

## 2014-05-26 DIAGNOSIS — IMO0001 Reserved for inherently not codable concepts without codable children: Secondary | ICD-10-CM | POA: Diagnosis not present

## 2014-05-26 DIAGNOSIS — Z96649 Presence of unspecified artificial hip joint: Secondary | ICD-10-CM | POA: Insufficient documentation

## 2014-05-28 ENCOUNTER — Encounter: Payer: Medicaid Other | Admitting: Physical Therapy

## 2014-07-10 ENCOUNTER — Telehealth: Payer: Self-pay

## 2014-07-10 NOTE — Telephone Encounter (Signed)
Ms. Dana Kennedy stated that she called our office to make an appointment with Dr. Marko Plume with in the last few weeks and the schedulers would not schedule one because there were no orders.  She was feeling a little weak and wanted her labs to be checked.  She went to her PCP and her cbc was fine.  Suggested that she can have lab with PCP or call and speak with Dr. Mariana Kaufman nurse stating need and an appointment can be scheduled.  Dr. Mariana Kaufman note on 11-05-13 states that she would see Dana Kennedy on a prn bases.  Patient appreciated the call and information.

## 2014-07-29 IMAGING — US US TRANSVAGINAL NON-OB
1 series · 13 of 25 positions shown · non-contrast
Comparison: 09/03/2010 pelvic ultrasound and 08/26/2010 CT
abdomen/pelvis

CLINICAL DATA: Abdominal pain

EXAM:
TRANSABDOMINAL AND TRANSVAGINAL ULTRASOUND OF PELVIS
TECHNIQUE: Both transabdominal and transvaginal ultrasound examinations of the
pelvis were performed. Transabdominal technique was performed for
global imaging of the pelvis including uterus, ovaries, adnexal
regions, and pelvic cul-de-sac. It was necessary to proceed with
endovaginal exam following the transabdominal exam to visualize the
ovaries and adnexae.

[Series 1: us transvaginal non-ob · 0.24mm/px · 61 acquisitions, 13 frames shown]
[im 1/61]
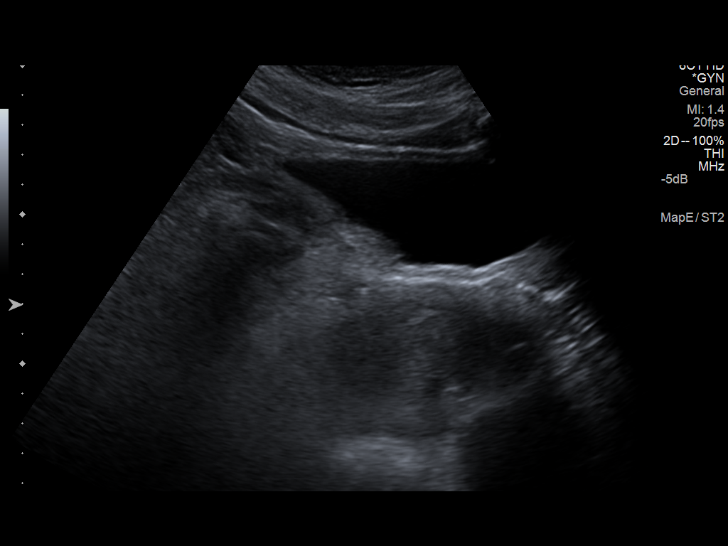
[im 6/61]
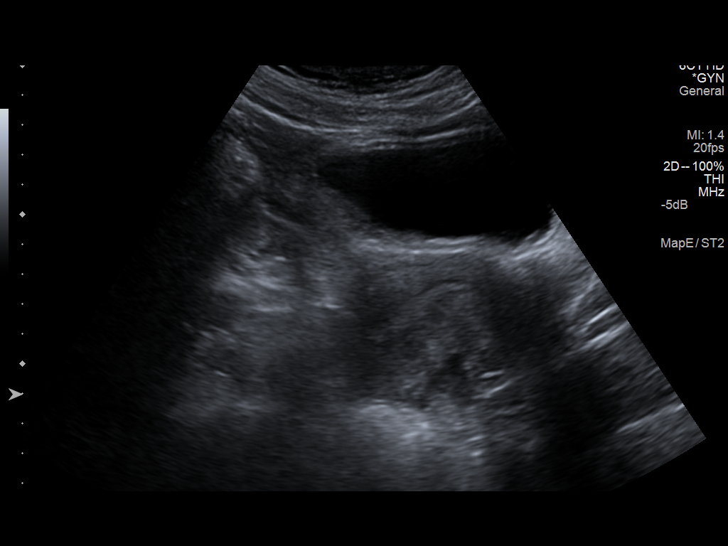
[im 11/61]
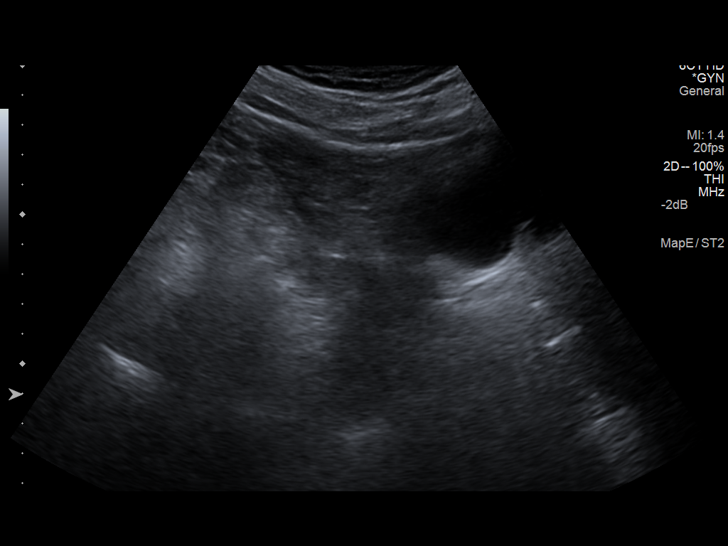
[im 16/61]
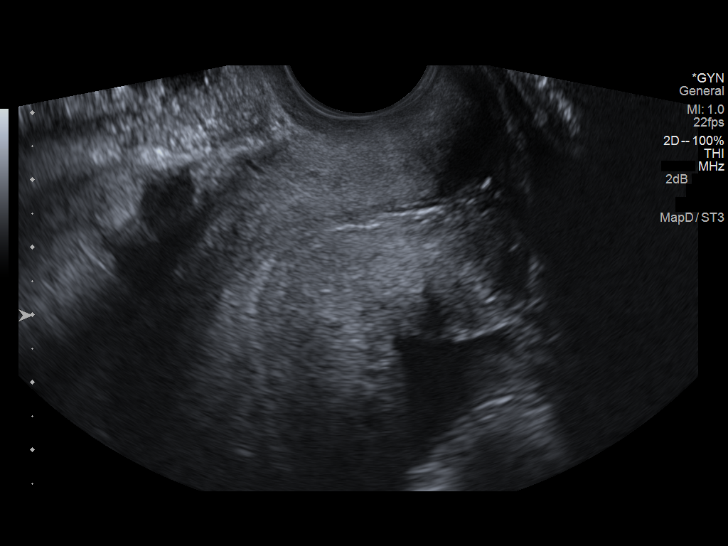
[im 21/61]
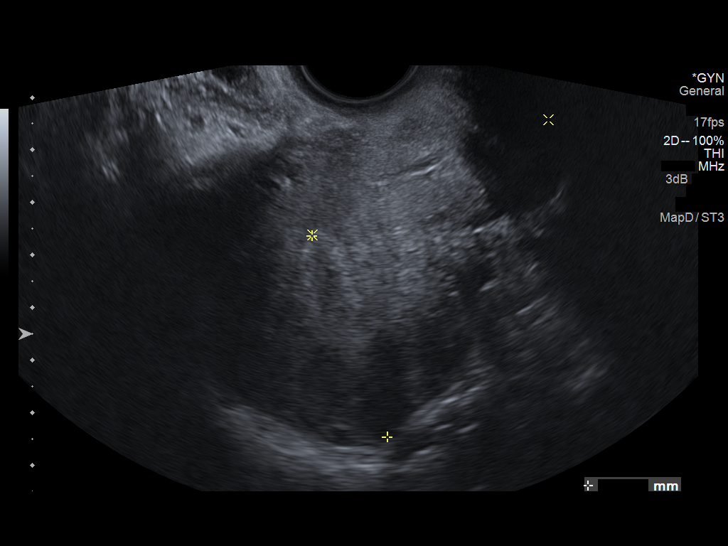
[im 26/61]
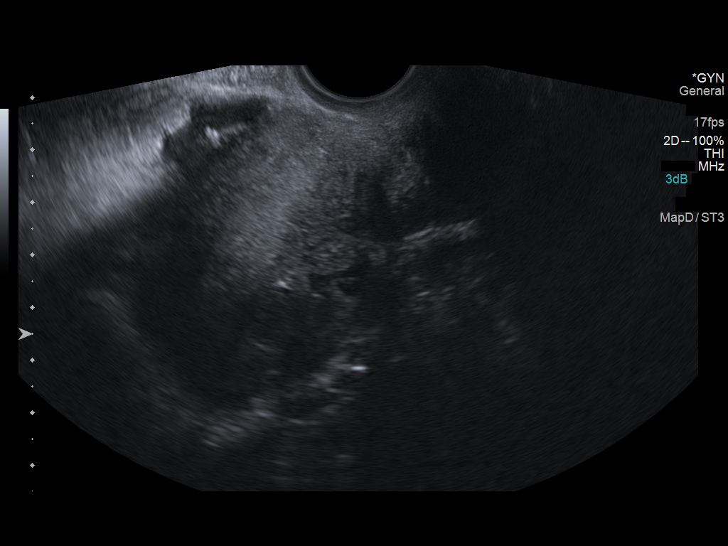
[im 31/61]
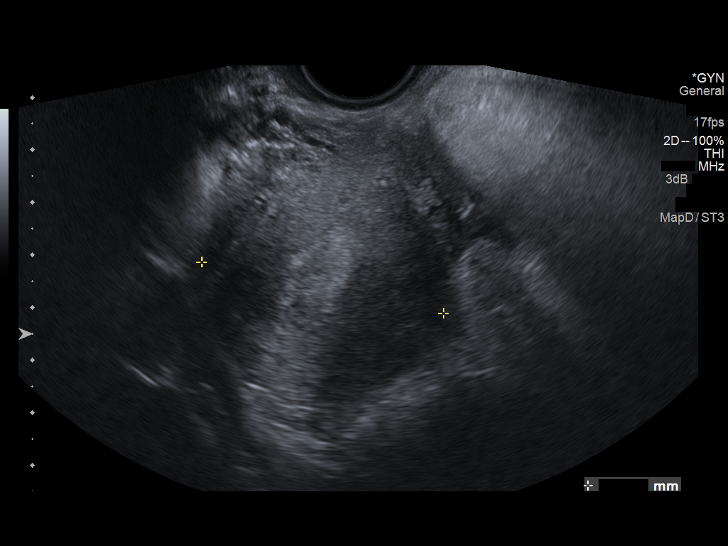
[im 36/61]
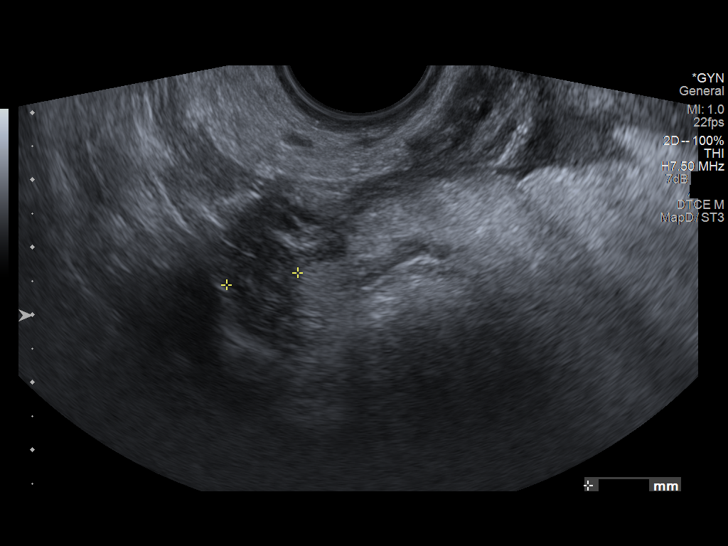
[im 41/61]
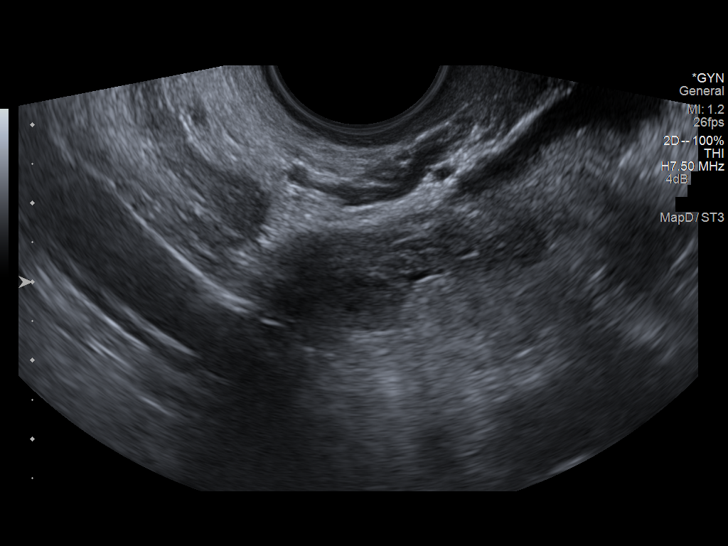
[im 46/61]
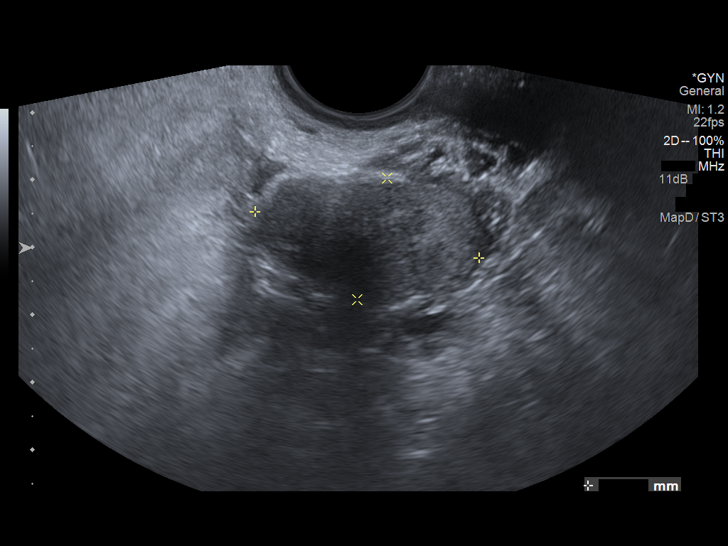
[im 51/61]
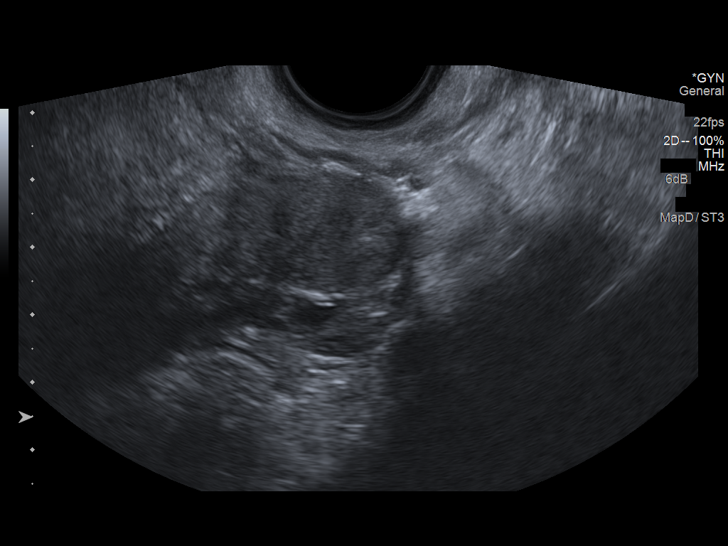
[im 56/61]
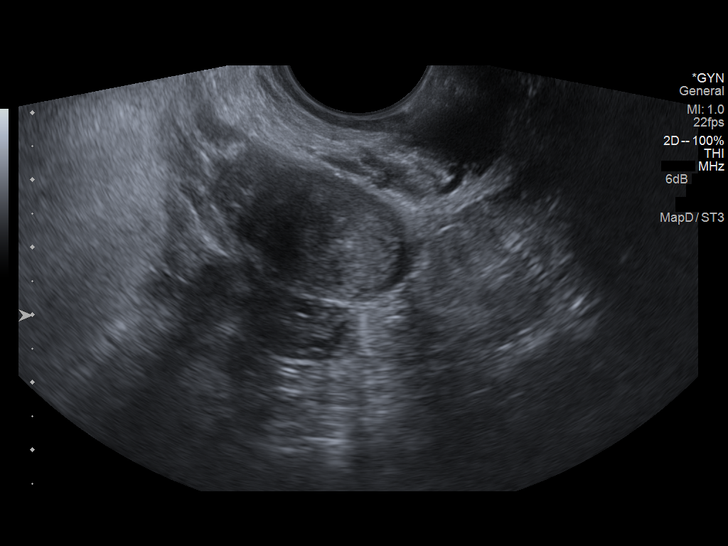
[im 61/61]
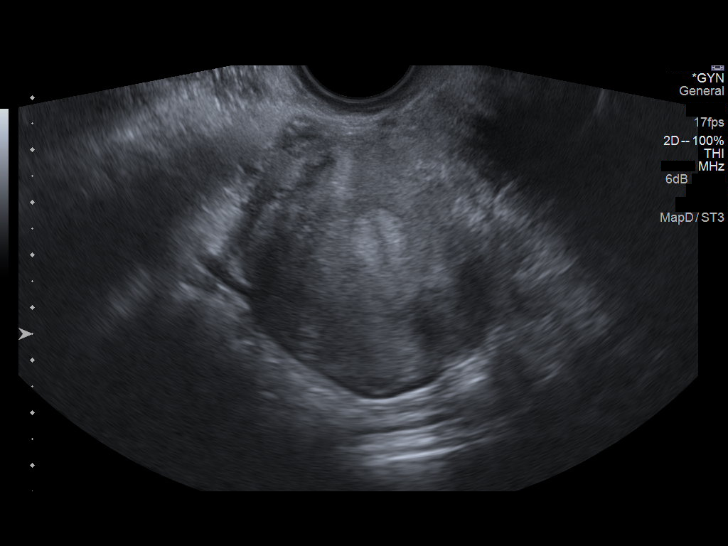

[13 of 25 positions shown; findings below may reference images not displayed]

FINDINGS: Uterus

Measurements: 9.1 x 4.8 x 4.7 cm. Retroflexed. Small submucosal
leiomyoma upper uterine segment posteriorly 13 x 12 x 14 mm.

Endometrium

Thickness: 11 mm thick, normal. No endometrial fluid or focal
abnormality

Right ovary

Measurements: 2.0 x 1.3 x 1.1 cm. Normal morphology without mass.

Left ovary

Measurements: 2.1 x 0.9 x 1.8 cm. Normal morphology without mass.

Other findings

Fairly homogeneous left adnexal mass identified 3.4 x 1.9 x 2.8 cm,
abutting the uterus, lying near but appearing separate from left
ovary.

This could represent an exophytic uterine leiomyoma or a nonspecific
solid left adnexal mass.

On prior CT, there is questionably an exophytic left uterine mass
2.7 x 2.4 cm series 3, image 65, though this is subserosal in not
definitely exophytic/pedunculated.

Small amount of simple appearing free pelvic fluid.
IMPRESSION: Retroflexed uterus containing a small submucosal leiomyoma 14 mm
diameter at upper uterine segment posteriorly.

Solid appearing left adnexal mass 3.4 x 1.9 x 2.8 cm, potentially an
exophytic leiomyoma arising from the left lateral aspect of the
uterus though this is not definitive and other etiologies are not
excluded ; questionable sub serosal left ureter mass on prior CT,
somewhat different in appearance.

Tumor not excluded.

Recommend MR imaging of the pelvis to further characterize and
localize the origin of this mass.

## 2014-08-19 ENCOUNTER — Other Ambulatory Visit: Payer: Self-pay

## 2014-08-19 ENCOUNTER — Ambulatory Visit: Payer: Medicaid Other | Attending: Internal Medicine | Admitting: Internal Medicine

## 2014-08-19 ENCOUNTER — Encounter: Payer: Self-pay | Admitting: Internal Medicine

## 2014-08-19 ENCOUNTER — Ambulatory Visit: Payer: Medicaid Other | Admitting: Internal Medicine

## 2014-08-19 VITALS — BP 111/79 | HR 84 | Temp 98.0°F | Resp 16 | Ht 65.0 in | Wt 196.0 lb

## 2014-08-19 DIAGNOSIS — M329 Systemic lupus erythematosus, unspecified: Secondary | ICD-10-CM | POA: Diagnosis not present

## 2014-08-19 DIAGNOSIS — Z87891 Personal history of nicotine dependence: Secondary | ICD-10-CM | POA: Insufficient documentation

## 2014-08-19 DIAGNOSIS — M25441 Effusion, right hand: Secondary | ICD-10-CM | POA: Diagnosis not present

## 2014-08-19 DIAGNOSIS — J45909 Unspecified asthma, uncomplicated: Secondary | ICD-10-CM | POA: Diagnosis not present

## 2014-08-19 DIAGNOSIS — Z1239 Encounter for other screening for malignant neoplasm of breast: Secondary | ICD-10-CM | POA: Diagnosis not present

## 2014-08-19 DIAGNOSIS — K219 Gastro-esophageal reflux disease without esophagitis: Secondary | ICD-10-CM | POA: Insufficient documentation

## 2014-08-19 DIAGNOSIS — M25529 Pain in unspecified elbow: Secondary | ICD-10-CM | POA: Insufficient documentation

## 2014-08-19 DIAGNOSIS — R079 Chest pain, unspecified: Secondary | ICD-10-CM | POA: Insufficient documentation

## 2014-08-19 DIAGNOSIS — Z79899 Other long term (current) drug therapy: Secondary | ICD-10-CM | POA: Diagnosis not present

## 2014-08-19 DIAGNOSIS — M7989 Other specified soft tissue disorders: Secondary | ICD-10-CM | POA: Insufficient documentation

## 2014-08-19 MED ORDER — TRAMADOL HCL 50 MG PO TABS: 50.0000 mg | ORAL_TABLET | Freq: Three times a day (TID) | ORAL | Status: DC | PRN

## 2014-08-19 MED ORDER — PREDNISONE 10 MG PO TABS: ORAL_TABLET | ORAL | Status: DC

## 2014-08-19 NOTE — Progress Notes (Signed)
Pt is here to establish care. Pt is c/o b/l arm pain that started in her elbows and is now radiating to her entire arm. Pt has a history of lupus. Pt states that she has occasional chest pain.

## 2014-08-19 NOTE — Patient Instructions (Signed)
Lupus Lupus (also called systemic lupus erythematosus, SLE) is a disorder of the body's natural defense system (immune system). In lupus, the immune system attacks various areas of the body (autoimmune disease). CAUSES The cause is unknown. However, lupus runs in families. Certain genes can make you more likely to develop lupus. It is 10 times more common in women than in men. Lupus is also more common in African Americans and Asians. Other factors also play a role, such as viruses (Epstein-Barr virus, EBV), stress, hormones, cigarette smoke, and certain drugs. SYMPTOMS Lupus can affect many parts of the body, including the joints, skin, kidneys, lungs, heart, nervous system, and blood vessels. The signs and symptoms of lupus differ from person to person. The disease can range from mild to life-threatening. Typical features of lupus include:  Butterfly-shaped rash over the face.  Arthritis involving one or more joints.  Kidney disease.  Fever, weight loss, hair loss, fatigue.  Poor circulation in the fingers and toes (Raynaud's disease).  Chest pain when taking deep breaths. Abdominal pain may also occur.  Skin rash in areas exposed to the sun.  Sores in the mouth and nose. DIAGNOSIS Diagnosing lupus can take a long time and is often difficult. An exam and an accurate account of your symptoms and health problems is very important. Blood tests are necessary, though no single test can confirm or rule out lupus. Most people with lupus test positive for antinuclear antibodies (ANA) on a blood test. Additional blood tests, a urine test (urinalysis), and sometimes a kidney or skin tissue sample (biopsy) can help to confirm or rule out lupus. TREATMENT There is no cure for lupus. Your caregiver will develop a treatment plan based on your age, sex, health, symptoms, and lifestyle. The goals are to prevent flares, to treat them when they do occur, and to minimize organ damage and complications. How  the disease may affect each person varies widely. Most people with lupus can live normal lives, but this disorder must be carefully monitored. Treatment must be adjusted as necessary to prevent serious complications. Medicines used for treatment:  Nonsteroidal anti-inflammatory drugs (NSAIDs) decrease inflammation and can help with chest pain, joint pain, and fevers. Examples include ibuprofen and naproxen.  Antimalarial drugs were designed to treat malaria. They also treat fatigue, joint pain, skin rashes, and inflammation of the lungs in patients with lupus.  Corticosteroids are powerful hormones that rapidly suppress inflammation. The lowest dose with the highest benefit will be chosen. They can be given by cream, pills, injections, and through the vein (intravenously).  Immunosuppressive drugs block the making of immune cells. They may be used for kidney or nerve disease. HOME CARE INSTRUCTIONS  Exercise. Low-impact activities can usually help keep joints flexible without being too strenuous.  Rest after periods of exercise.  Avoid excessive sun exposure.  Follow proper nutrition and take supplements as recommended by your caregiver.  Stress management can be helpful. SEEK MEDICAL CARE IF:  You have increased fatigue.  You develop pain.  You develop a rash.  You have an oral temperature above 102 F (38.9 C).  You develop abdominal discomfort.  You develop a headache.  You experience dizziness. FOR MORE INFORMATION National Institute of Neurological Disorders and Stroke: www.ninds.nih.gov American College of Rheumatology: www.rheumatology.org National Institute of Arthritis and Musculoskeletal and Skin Diseases: www.niams.nih.gov Document Released: 08/25/2002 Document Revised: 11/27/2011 Document Reviewed: 12/16/2009 Exit[employer] Patient Information 2015 [employer]. This information is not intended to replace advice given to you by your health   care provider. Make sure  you discuss any questions you have with your health care provider.  

## 2014-08-19 NOTE — Progress Notes (Signed)
Patient ID: Dana Kennedy, female   DOB: 02-Jan-1966, 48 y.o.   MRN: [id number]  KVQ:[id number]  IEP:[id number]  DOB - 19-Feb-1966  CC:  Chief Complaint  Patient presents with  . Establish Care       HPI: Dana Kennedy is a 48 y.o. female with a past medical history of SLE, asthma, and anemia here today to establish medical care.  Patient reports that she has been having symptoms of bilateral arm pain.  She states that the arm pain has been present for the past three weeks.  The pain begins in her elbows and radiates up her arms.  She reports that she has had swelling of BUE as well.  She states that she had a rheumatologist (Dr. Charlestine Night)  in the past but has not been to see him in the past year and has been off the plaquenil for 1.5 years.   She c/o of chest pain for one month.  The pain is described as a sharp pain that happens intermittently.  She states that the pain occasionally happens at rest and only last for a brief minute. Notes that she does have some nausea, but not described as indigestion.      Allergies  Allergen Reactions  . Aspirin     Causes bleeding  . Eszopiclone Swelling    Felt like throat was closing. Not sure if it was related to this medication or not. lunesta  . Restoril [Temazepam]     Headaches    Past Medical History  Diagnosis Date  . Lupus   . Asthma   . GERD (gastroesophageal reflux disease)   . Arthritis   . Anemia    Current Outpatient Prescriptions on File Prior to Visit  Medication Sig Dispense Refill  . albuterol (PROVENTIL HFA;VENTOLIN HFA) 108 (90 BASE) MCG/ACT inhaler Inhale 2 puffs into the lungs every 6 (six) hours as needed. For shortness of breath 1 Inhaler 0  . bismuth subsalicylate (PEPTO BISMOL) 262 MG chewable tablet Chew 524 mg by mouth as needed. For upset stomach    . cetirizine (ZYRTEC) 10 MG tablet Take 1 tablet (10 mg total) by mouth daily. 30 tablet 5  . ferrous sulfate 325 (65 FE) MG tablet Take 325 mg by mouth daily with  breakfast.    . folic acid (FOLVITE) 1 MG tablet Take 1 tablet (1 mg total) by mouth daily. 30 tablet 2  . hydroxychloroquine (PLAQUENIL) 200 MG tablet Take 600 mg by mouth daily.    . methocarbamol (ROBAXIN) 500 MG tablet Take 500 mg by mouth every 6 (six) hours as needed for muscle spasms.     . nicotine (NICODERM CQ - DOSED IN MG/24 HOURS) 14 mg/24hr patch Place 1 patch (14 mg total) onto the skin daily. 30 patch 1  . oxyCODONE-acetaminophen (PERCOCET/ROXICET) 5-325 MG per tablet Take 1-2 tablets by mouth every 4 (four) hours as needed for moderate pain or severe pain.    . pantoprazole (PROTONIX) 40 MG tablet Take 1 tablet (40 mg total) by mouth every morning. 30 tablet 2  . HYDROcodone-acetaminophen (NORCO/VICODIN) 5-325 MG per tablet Take 1-2 tablets by mouth every 4 (four) hours as needed. (Patient not taking: Reported on 08/19/2014) 15 tablet 0   No current facility-administered medications on file prior to visit.   Family History  Problem Relation Age of Onset  . Cancer Mother   . Hypertension Mother    History   Social History  . Marital Status: Married    Spouse  Name: N/A    Number of Children: N/A  . Years of Education: N/A   Occupational History  . Not on file.   Social History Main Topics  . Smoking status: Former Smoker -- 0.50 packs/day for 29 years    Types: Cigarettes    Quit date: 10/08/2013  . Smokeless tobacco: Never Used  . Alcohol Use: No     Comment: occasionally  . Drug Use: No  . Sexual Activity: Yes   Other Topics Concern  . Not on file   Social History Narrative    Review of Systems  HENT: Negative.   Respiratory: Negative.   Cardiovascular: Positive for chest pain. Negative for palpitations and leg swelling.  Musculoskeletal:       Swelling of the joints in the right hand and arm   Neurological: Positive for dizziness and tingling.  All other systems reviewed and are negative.  .    Objective:   Filed Vitals:   08/19/14 1054  BP:  111/79  Pulse: 84  Temp: 98 F (36.7 C)  Resp: 16    Physical Exam: Constitutional: Patient appears well-developed and well-nourished. No distress. HENT: Normocephalic, atraumatic, External right and left ear normal. Oropharynx is clear and moist.  Eyes: Conjunctivae and EOM are normal. PERRLA, no scleral icterus. Neck: Normal ROM. Neck supple. No JVD. No tracheal deviation. No thyromegaly. CVS: RRR, S1/S2 +, no murmurs, no gallops, no carotid bruit.  Pulmonary: Effort and breath sounds normal, no stridor, rhonchi, wheezes, rales.  Abdominal: Soft. BS +, no distension, tenderness, rebound or guarding.  Musculoskeletal: Normal range of motion. no tenderness. Swelling of the right hand and arm Lymphadenopathy: No lymphadenopathy noted, cervical Neuro: Alert. Normal reflexes, muscle tone coordination. No cranial nerve deficit. Skin: Skin is warm and dry. No rash noted. Not diaphoretic. No erythema. No pallor. Psychiatric: Normal mood and affect. Behavior, judgment, thought content normal.  Lab Results  Component Value Date   WBC 11.9* 11/05/2013   HGB 13.1 11/05/2013   HCT 38.8 11/05/2013   MCV 83.4 11/05/2013   PLT 405* 11/05/2013   Lab Results  Component Value Date   CREATININE 0.61 11/05/2013   BUN 9 11/05/2013   NA 137 11/05/2013   K 4.2 11/05/2013   CL 100 11/05/2013   CO2 23 11/05/2013    No results found for: HGBA1C Lipid Panel  No results found for: CHOL, TRIG, HDL, CHOLHDL, VLDL, LDLCALC     Assessment and plan:   Alejandro was seen today for establish care.  Diagnoses and associated orders for this visit:  Chest pain, unspecified chest pain typ - EKG 12-Lead - Ambulatory referral to Rheumatology  SLE (systemic lupus erythematosus) - predniSONE (DELTASONE) 10 MG tablet; 6-5-4-3-2-1 Decrease by one pill each day - traMADol (ULTRAM) 50 MG tablet; Take 1 tablet (50 mg total) by mouth every 8 (eight) hours as needed.  Breast cancer screening - MM Digital  Screening; Future  Schedule next appointment for physical and blood work     Chari Manning, [facility] [phone] 08/19/2014, 11:36 AM

## 2014-09-22 ENCOUNTER — Emergency Department (HOSPITAL_COMMUNITY)
Admission: EM | Admit: 2014-09-22 | Discharge: 2014-09-23 | Disposition: A | Discharge status: Home / Self Care | Payer: Medicaid Other | Attending: Emergency Medicine | Admitting: Emergency Medicine

## 2014-09-22 ENCOUNTER — Inpatient Hospital Stay: Admission: RE | Admit: 2014-09-22 | Payer: Self-pay | Source: Ambulatory Visit

## 2014-09-22 ENCOUNTER — Encounter (HOSPITAL_COMMUNITY): Payer: Self-pay | Admitting: Emergency Medicine

## 2014-09-22 DIAGNOSIS — K219 Gastro-esophageal reflux disease without esophagitis: Secondary | ICD-10-CM | POA: Insufficient documentation

## 2014-09-22 DIAGNOSIS — M329 Systemic lupus erythematosus, unspecified: Secondary | ICD-10-CM | POA: Diagnosis present

## 2014-09-22 DIAGNOSIS — Z87891 Personal history of nicotine dependence: Secondary | ICD-10-CM | POA: Insufficient documentation

## 2014-09-22 DIAGNOSIS — D649 Anemia, unspecified: Secondary | ICD-10-CM | POA: Insufficient documentation

## 2014-09-22 DIAGNOSIS — Z79899 Other long term (current) drug therapy: Secondary | ICD-10-CM | POA: Diagnosis not present

## 2014-09-22 DIAGNOSIS — J45909 Unspecified asthma, uncomplicated: Secondary | ICD-10-CM | POA: Diagnosis not present

## 2014-09-22 DIAGNOSIS — M1990 Unspecified osteoarthritis, unspecified site: Secondary | ICD-10-CM | POA: Diagnosis not present

## 2014-09-22 LAB — I-STAT CHEM 8, ED
BUN: 14 mg/dL (ref 6–23)
CREATININE: 1.1 mg/dL (ref 0.50–1.10)
Calcium, Ion: 1.21 mmol/L (ref 1.12–1.23)
Chloride: 104 mEq/L (ref 96–112)
Glucose, Bld: 107 mg/dL — ABNORMAL HIGH (ref 70–99)
HCT: 41 % (ref 36.0–46.0)
Hemoglobin: 13.9 g/dL (ref 12.0–15.0)
POTASSIUM: 4.2 mmol/L (ref 3.5–5.1)
SODIUM: 139 mmol/L (ref 135–145)
TCO2: 22 mmol/L (ref 0–100)

## 2014-09-22 LAB — COMPREHENSIVE METABOLIC PANEL
ALT: 16 U/L (ref 0–35)
AST: 22 U/L (ref 0–37)
Albumin: 2.9 g/dL — ABNORMAL LOW (ref 3.5–5.2)
Alkaline Phosphatase: 63 U/L (ref 39–117)
Anion gap: 10 (ref 5–15)
BUN: 13 mg/dL (ref 6–23)
CHLORIDE: 106 meq/L (ref 96–112)
CO2: 22 mmol/L (ref 19–32)
CREATININE: 1.1 mg/dL (ref 0.50–1.10)
Calcium: 9 mg/dL (ref 8.4–10.5)
GFR calc Af Amer: 68 mL/min — ABNORMAL LOW (ref >90)
GFR calc non Af Amer: 59 mL/min — ABNORMAL LOW (ref >90)
Glucose, Bld: 110 mg/dL — ABNORMAL HIGH (ref 70–99)
Potassium: 4.1 mmol/L (ref 3.5–5.1)
Sodium: 138 mmol/L (ref 135–145)
Total Bilirubin: <0.1 mg/dL — ABNORMAL LOW (ref 0.3–1.2)
Total Protein: 5.7 g/dL — ABNORMAL LOW (ref 6.0–8.3)

## 2014-09-22 LAB — CBC WITH DIFFERENTIAL/PLATELET
Basophils Absolute: 0 10*3/uL (ref 0.0–0.1)
Basophils Relative: 0 % (ref 0–1)
Eosinophils Absolute: 0.4 10*3/uL (ref 0.0–0.7)
Eosinophils Relative: 4 % (ref 0–5)
HEMATOCRIT: 39.2 % (ref 36.0–46.0)
Hemoglobin: 12.5 g/dL (ref 12.0–15.0)
Lymphocytes Relative: 30 % (ref 12–46)
Lymphs Abs: 2.5 10*3/uL (ref 0.7–4.0)
MCH: 25.8 pg — ABNORMAL LOW (ref 26.0–34.0)
MCHC: 31.9 g/dL (ref 30.0–36.0)
MCV: 80.8 fL (ref 78.0–100.0)
MONOS PCT: 11 % (ref 3–12)
Monocytes Absolute: 1 10*3/uL (ref 0.1–1.0)
Neutro Abs: 4.6 10*3/uL (ref 1.7–7.7)
Neutrophils Relative %: 55 % (ref 43–77)
Platelets: 360 10*3/uL (ref 150–400)
RBC: 4.85 MIL/uL (ref 3.87–5.11)
RDW: 15.2 % (ref 11.5–15.5)
WBC: 8.4 10*3/uL (ref 4.0–10.5)

## 2014-09-22 MED ORDER — SODIUM CHLORIDE 0.9 % IV SOLN
Freq: Once | INTRAVENOUS | Status: AC
  Administered 2014-09-22: 23:00:00 via INTRAVENOUS

## 2014-09-22 NOTE — ED Provider Notes (Signed)
CSN: [id number]     Arrival date & time 09/22/14  2208 History   First MD Initiated Contact with Patient 09/22/14 2225     Chief Complaint  Patient presents with  . Lupus     (Consider location/radiation/quality/duration/timing/severity/associated sxs/prior Treatment) HPI Comments: Patient has not had "Lupus Doctor" for a while and not had steroids in over a month nor her Plaquenil  She has an appointment at [facility] in February  Now with left side pain, cough, sore throat  Denies fever, CP  The history is provided by the patient.    Past Medical History  Diagnosis Date  . Lupus   . Asthma   . GERD (gastroesophageal reflux disease)   . Arthritis   . Anemia    Past Surgical History  Procedure Laterality Date  . Stomach ulcer surgery  2008  . Total hip arthroplasty  06/14/2012    Procedure: TOTAL HIP ARTHROPLASTY ANTERIOR APPROACH;  Surgeon: Mcarthur Rossetti, MD;  Location: [facility] ORS;  Service: Orthopedics;  Laterality: Left;  Left Total Hip Arthroplasty, Anterior Approach C-Arm  . Tonsillectomy    . Tonsillectomy     Family History  Problem Relation Age of Onset  . Cancer Mother   . Hypertension Mother    History  Substance Use Topics  . Smoking status: Former Smoker -- 0.50 packs/day for 29 years    Types: Cigarettes    Quit date: 10/08/2013  . Smokeless tobacco: Never Used  . Alcohol Use: No     Comment: occasionally   OB History    No data available     Review of Systems  Constitutional: Negative for fever and chills.  HENT: Positive for sore throat. Negative for congestion, rhinorrhea and trouble swallowing.   Respiratory: Positive for cough. Negative for shortness of breath.   Gastrointestinal: Negative for nausea and vomiting.  Genitourinary: Negative for dysuria.  Musculoskeletal: Negative for back pain.  Skin: Negative for rash.  Neurological: Negative for dizziness, weakness and headaches.  All other systems reviewed and are  negative.     Allergies  Aspirin; Eszopiclone; and Restoril  Home Medications   Prior to Admission medications   Medication Sig Start Date End Date Taking? Authorizing Provider  albuterol (PROVENTIL HFA;VENTOLIN HFA) 108 (90 BASE) MCG/ACT inhaler Inhale 2 puffs into the lungs every 6 (six) hours as needed. For shortness of breath 10/29/13  Yes Lennis Marion Downer, MD  cetirizine (ZYRTEC) 10 MG tablet Take 1 tablet (10 mg total) by mouth daily. 11/05/12  Yes Lennis Marion Downer, MD  ferrous sulfate 325 (65 FE) MG tablet Take 325 mg by mouth daily with breakfast.   Yes Historical Provider, MD  folic acid (FOLVITE) 1 MG tablet Take 1 tablet (1 mg total) by mouth daily. 10/16/12  Yes Lennis Marion Downer, MD  hydroxychloroquine (PLAQUENIL) 200 MG tablet Take 600 mg by mouth daily.   Yes Historical Provider, MD  oxyCODONE-acetaminophen (PERCOCET/ROXICET) 5-325 MG per tablet Take 1-2 tablets by mouth every 4 (four) hours as needed for moderate pain or severe pain.   Yes Historical Provider, MD  pantoprazole (PROTONIX) 40 MG tablet Take 1 tablet (40 mg total) by mouth every morning. 01/08/13  Yes Lennis Marion Downer, MD  bismuth subsalicylate (PEPTO BISMOL) 262 MG chewable tablet Chew 524 mg by mouth as needed. For upset stomach    Historical Provider, MD  HYDROcodone-acetaminophen (NORCO/VICODIN) 5-325 MG per tablet Take 1-2 tablets by mouth every 6 (six) hours as needed. 09/23/14   Lula Olszewski  Olean Ree, NP  methocarbamol (ROBAXIN) 500 MG tablet Take 500 mg by mouth every 6 (six) hours as needed for muscle spasms.     Historical Provider, MD  nicotine (NICODERM CQ - DOSED IN MG/24 HOURS) 14 mg/24hr patch Place 1 patch (14 mg total) onto the skin daily. Patient not taking: Reported on 09/22/2014 10/29/13   Gordy Levan, MD  predniSONE (DELTASONE) 10 MG tablet 6-5-4-3-2-1 Decrease by one pill each day 09/23/14   Garald Balding, NP  traMADol (ULTRAM) 50 MG tablet Take 1 tablet (50 mg total) by mouth every 8 (eight) hours as  needed. Patient not taking: Reported on 09/22/2014 08/19/14   Lance Bosch, NP   BP 122/82 mmHg  Pulse 90  Temp(Src) 98.1 F (36.7 C) (Oral)  Resp 20  Ht 5\' 5"  (1.651 m)  Wt 196 lb (88.905 kg)  BMI 32.62 kg/m2  SpO2 99%  LMP 08/24/2014 Physical Exam  Constitutional: She is oriented to person, place, and time. She appears well-developed and well-nourished.  HENT:  Head: Normocephalic.  Right Ear: External ear normal.  Left Ear: External ear normal.  Mouth/Throat: Oropharynx is clear and moist.  Eyes: Pupils are equal, round, and reactive to light.  Neck: Normal range of motion.  Cardiovascular: Normal rate and regular rhythm.   Pulmonary/Chest: Effort normal and breath sounds normal.  Abdominal: Soft. Bowel sounds are normal.  Musculoskeletal: Normal range of motion.  Neurological: She is alert and oriented to person, place, and time.  Skin: Skin is warm. No rash noted. No erythema.  Vitals reviewed.   ED Course  Procedures (including critical care time) Labs Review Labs Reviewed  CBC WITH DIFFERENTIAL - Abnormal; Notable for the following:    MCH 25.8 (*)    All other components within normal limits  COMPREHENSIVE METABOLIC PANEL - Abnormal; Notable for the following:    Glucose, Bld 110 (*)    Total Protein 5.7 (*)    Albumin 2.9 (*)    Total Bilirubin <0.1 (*)    GFR calc non Af Amer 59 (*)    GFR calc Af Amer 68 (*)    All other components within normal limits  URINALYSIS, ROUTINE W REFLEX MICROSCOPIC - Abnormal; Notable for the following:    APPearance CLOUDY (*)    Leukocytes, UA SMALL (*)    All other components within normal limits  URINE MICROSCOPIC-ADD ON - Abnormal; Notable for the following:    Squamous Epithelial / LPF FEW (*)    Bacteria, UA FEW (*)    All other components within normal limits  I-STAT CHEM 8, ED - Abnormal; Notable for the following:    Glucose, Bld 107 (*)    All other components within normal limits    Imaging Review No  results found.   EKG Interpretation None     Patient is having a lupus exacerbation.  Her labs are at her normal without any indication for decreased kidney function nor overwhelming infection.  She will be discharged him with Vicodin and a steroid taper.  Encouraged to keep her appointment with her new specialist at [facility] as scheduled for February MDM   Final diagnoses:  Lupus         Garald Balding, NP 09/23/14 [place], NP 09/23/14 [place], MD 09/25/14 1324

## 2014-09-22 NOTE — ED Notes (Signed)
Pt. reports lupus flare up onset 2 weeks ago worse today with left side body aches, headache and emesis yesterday . Denies fever or chills/respirations unlabored .  Marland Kitchen

## 2014-09-23 LAB — URINALYSIS, ROUTINE W REFLEX MICROSCOPIC
Bilirubin Urine: NEGATIVE
Glucose, UA: NEGATIVE mg/dL
HGB URINE DIPSTICK: NEGATIVE
Ketones, ur: NEGATIVE mg/dL
Nitrite: NEGATIVE
PROTEIN: NEGATIVE mg/dL
Specific Gravity, Urine: 1.018 (ref 1.005–1.030)
UROBILINOGEN UA: 0.2 mg/dL (ref 0.0–1.0)
pH: 6 (ref 5.0–8.0)

## 2014-09-23 LAB — URINE MICROSCOPIC-ADD ON

## 2014-09-23 MED ORDER — PREDNISONE 10 MG PO TABS: ORAL_TABLET | ORAL | Status: DC

## 2014-09-23 MED ORDER — PREDNISONE 20 MG PO TABS
40.0000 mg | ORAL_TABLET | ORAL | Status: AC
  Administered 2014-09-23: 40 mg via ORAL
  Filled 2014-09-23: qty 2

## 2014-09-23 MED ORDER — HYDROCODONE-ACETAMINOPHEN 5-325 MG PO TABS: 1.0000 | ORAL_TABLET | Freq: Four times a day (QID) | ORAL | Status: DC | PRN

## 2014-09-23 MED ORDER — HYDROCODONE-ACETAMINOPHEN 5-325 MG PO TABS
1.0000 | ORAL_TABLET | Freq: Once | ORAL | Status: AC
  Administered 2014-09-23: 1 via ORAL
  Filled 2014-09-23: qty 1

## 2014-09-23 NOTE — ED Notes (Signed)
Family at bedside. 

## 2014-09-23 NOTE — ED Notes (Signed)
ASSESSMENT: Pt reports left side pain extending from shoulder to foot, denies trauma. No neuro deficits noted, moves all extremities, +cms in all extremities with palpable pulses.  No respiratory difficulty noted. No complaints GI/GU Skin intact

## 2015-01-12 ENCOUNTER — Emergency Department (HOSPITAL_COMMUNITY)
Admission: EM | Admit: 2015-01-12 | Discharge: 2015-01-12 | Disposition: A | Discharge status: Home / Self Care | Payer: Medicaid Other | Attending: Emergency Medicine | Admitting: Emergency Medicine

## 2015-01-12 ENCOUNTER — Emergency Department (HOSPITAL_COMMUNITY): Payer: Medicaid Other

## 2015-01-12 ENCOUNTER — Encounter (HOSPITAL_COMMUNITY): Payer: Self-pay | Admitting: Emergency Medicine

## 2015-01-12 DIAGNOSIS — R05 Cough: Secondary | ICD-10-CM | POA: Diagnosis present

## 2015-01-12 DIAGNOSIS — M329 Systemic lupus erythematosus, unspecified: Secondary | ICD-10-CM | POA: Diagnosis not present

## 2015-01-12 DIAGNOSIS — J029 Acute pharyngitis, unspecified: Secondary | ICD-10-CM

## 2015-01-12 DIAGNOSIS — D649 Anemia, unspecified: Secondary | ICD-10-CM | POA: Insufficient documentation

## 2015-01-12 DIAGNOSIS — Z720 Tobacco use: Secondary | ICD-10-CM | POA: Diagnosis not present

## 2015-01-12 DIAGNOSIS — K219 Gastro-esophageal reflux disease without esophagitis: Secondary | ICD-10-CM | POA: Insufficient documentation

## 2015-01-12 DIAGNOSIS — Z7951 Long term (current) use of inhaled steroids: Secondary | ICD-10-CM | POA: Diagnosis not present

## 2015-01-12 DIAGNOSIS — R221 Localized swelling, mass and lump, neck: Secondary | ICD-10-CM | POA: Diagnosis not present

## 2015-01-12 DIAGNOSIS — J45909 Unspecified asthma, uncomplicated: Secondary | ICD-10-CM | POA: Insufficient documentation

## 2015-01-12 DIAGNOSIS — J390 Retropharyngeal and parapharyngeal abscess: Secondary | ICD-10-CM | POA: Diagnosis not present

## 2015-01-12 DIAGNOSIS — Z79899 Other long term (current) drug therapy: Secondary | ICD-10-CM | POA: Insufficient documentation

## 2015-01-12 DIAGNOSIS — M542 Cervicalgia: Secondary | ICD-10-CM | POA: Insufficient documentation

## 2015-01-12 DIAGNOSIS — M1990 Unspecified osteoarthritis, unspecified site: Secondary | ICD-10-CM | POA: Diagnosis not present

## 2015-01-12 LAB — CBC WITH DIFFERENTIAL/PLATELET
Basophils Absolute: 0 10*3/uL (ref 0.0–0.1)
Basophils Relative: 0 % (ref 0–1)
Eosinophils Absolute: 0.1 10*3/uL (ref 0.0–0.7)
Eosinophils Relative: 1 % (ref 0–5)
HEMATOCRIT: 35.9 % — AB (ref 36.0–46.0)
Hemoglobin: 11.5 g/dL — ABNORMAL LOW (ref 12.0–15.0)
Lymphocytes Relative: 14 % (ref 12–46)
Lymphs Abs: 1.6 10*3/uL (ref 0.7–4.0)
MCH: 25.8 pg — AB (ref 26.0–34.0)
MCHC: 32 g/dL (ref 30.0–36.0)
MCV: 80.5 fL (ref 78.0–100.0)
Monocytes Absolute: 0.8 10*3/uL (ref 0.1–1.0)
Monocytes Relative: 8 % (ref 3–12)
Neutro Abs: 8.6 10*3/uL — ABNORMAL HIGH (ref 1.7–7.7)
Neutrophils Relative %: 77 % (ref 43–77)
Platelets: 327 10*3/uL (ref 150–400)
RBC: 4.46 MIL/uL (ref 3.87–5.11)
RDW: 15.2 % (ref 11.5–15.5)
WBC: 11.2 10*3/uL — ABNORMAL HIGH (ref 4.0–10.5)

## 2015-01-12 LAB — BASIC METABOLIC PANEL
ANION GAP: 8 (ref 5–15)
BUN: <5 mg/dL — ABNORMAL LOW (ref 6–23)
CALCIUM: 8.5 mg/dL (ref 8.4–10.5)
CHLORIDE: 105 mmol/L (ref 96–112)
CO2: 25 mmol/L (ref 19–32)
Creatinine, Ser: 0.69 mg/dL (ref 0.50–1.10)
GFR calc Af Amer: >90 mL/min (ref >90)
GFR calc non Af Amer: >90 mL/min (ref >90)
Glucose, Bld: 108 mg/dL — ABNORMAL HIGH (ref 70–99)
POTASSIUM: 3.6 mmol/L (ref 3.5–5.1)
Sodium: 138 mmol/L (ref 135–145)

## 2015-01-12 LAB — SEDIMENTATION RATE: Sed Rate: 18 mm/hr (ref 0–22)

## 2015-01-12 MED ORDER — FENTANYL CITRATE (PF) 100 MCG/2ML IJ SOLN
50.0000 ug | Freq: Once | INTRAMUSCULAR | Status: AC
  Administered 2015-01-12: 50 ug via INTRAVENOUS
  Filled 2015-01-12: qty 2

## 2015-01-12 MED ORDER — SODIUM CHLORIDE 0.9 % IV BOLUS (SEPSIS)
1000.0000 mL | Freq: Once | INTRAVENOUS | Status: AC
  Administered 2015-01-12: 1000 mL via INTRAVENOUS

## 2015-01-12 MED ORDER — CLINDAMYCIN PHOSPHATE 900 MG/50ML IV SOLN
900.0000 mg | Freq: Once | INTRAVENOUS | Status: AC
  Administered 2015-01-12: 900 mg via INTRAVENOUS
  Filled 2015-01-12: qty 50

## 2015-01-12 MED ORDER — DEXAMETHASONE SODIUM PHOSPHATE 10 MG/ML IJ SOLN
10.0000 mg | Freq: Once | INTRAMUSCULAR | Status: AC
  Administered 2015-01-12: 10 mg via INTRAVENOUS
  Filled 2015-01-12: qty 1

## 2015-01-12 MED ORDER — IOHEXOL 300 MG/ML  SOLN
80.0000 mL | Freq: Once | INTRAMUSCULAR | Status: AC | PRN
  Administered 2015-01-12: 80 mL via INTRAVENOUS

## 2015-01-12 MED ORDER — CLINDAMYCIN HCL 150 MG PO CAPS: 300.0000 mg | ORAL_CAPSULE | Freq: Three times a day (TID) | ORAL | Status: AC

## 2015-01-12 NOTE — ED Notes (Signed)
Pt c/o pain with cough and inspiration; pt with nasal congestion; pt sts some facial swelling with hx of lupus and same

## 2015-01-12 NOTE — ED Provider Notes (Signed)
CSN: [id number]     Arrival date & time 01/12/15  4259 History   First MD Initiated Contact with Patient 01/12/15 [phone]     Chief Complaint  Patient presents with  . Cough  . Facial Swelling   (Consider location/radiation/quality/duration/timing/severity/associated sxs/prior Treatment)  Patient is a 49 y.o. female presenting with pharyngitis. The history is provided by the patient. No language interpreter was used.  Sore Throat This is a new problem. The current episode started yesterday. The problem occurs constantly. The problem has been gradually worsening. Associated symptoms include a sore throat. Pertinent negatives include no abdominal pain, chest pain, chills, coughing, fatigue, fever, headaches, nausea, vomiting or weakness. Nothing aggravates the symptoms. She has tried acetaminophen for the symptoms. The treatment provided no relief.    Past Medical History  Diagnosis Date  . Lupus   . Asthma   . GERD (gastroesophageal reflux disease)   . Arthritis   . Anemia    Past Surgical History  Procedure Laterality Date  . Stomach ulcer surgery  2008  . Total hip arthroplasty  06/14/2012    Procedure: TOTAL HIP ARTHROPLASTY ANTERIOR APPROACH;  Surgeon: Mcarthur Rossetti, MD;  Location: [facility];  Service: Orthopedics;  Laterality: Left;  Left Total Hip Arthroplasty, Anterior Approach C-Arm  . Tonsillectomy    . Tonsillectomy     Family History  Problem Relation Age of Onset  . Cancer Mother   . Hypertension Mother    History  Substance Use Topics  . Smoking status: Current Every Day Smoker -- 0.50 packs/day for 29 years    Types: Cigarettes    Last Attempt to Quit: 10/08/2013  . Smokeless tobacco: Never Used  . Alcohol Use: Yes     Comment: occasionally   OB History    No data available     Review of Systems  Constitutional: Negative for fever, chills and fatigue.  HENT: Positive for facial swelling and sore throat. Negative for tinnitus, trouble swallowing and  voice change.   Respiratory: Negative for cough, chest tightness and shortness of breath.   Cardiovascular: Negative for chest pain.  Gastrointestinal: Negative for nausea, vomiting and abdominal pain.  Neurological: Negative for weakness, light-headedness and headaches.  Psychiatric/Behavioral: Negative for confusion.  All other systems reviewed and are negative.   Allergies  Aspirin; Eszopiclone; and Restoril  Home Medications   Prior to Admission medications   Medication Sig Start Date End Date Taking? Authorizing Provider  acetaminophen (TYLENOL) 500 MG tablet Take 1,000 mg by mouth every 6 (six) hours as needed for mild pain or moderate pain.   Yes Historical Provider, MD  bismuth subsalicylate (PEPTO BISMOL) 262 MG chewable tablet Chew 524 mg by mouth as needed. For upset stomach   Yes Historical Provider, MD  cetirizine (ZYRTEC) 10 MG tablet Take 1 tablet (10 mg total) by mouth daily. 11/05/12  Yes Lennis Marion Downer, MD  ferrous sulfate 325 (65 FE) MG tablet Take 325 mg by mouth daily.   Yes Historical Provider, MD  fluticasone (FLONASE) 50 MCG/ACT nasal spray Place 1 spray into both nostrils daily.   Yes Historical Provider, MD  folic acid (FOLVITE) 1 MG tablet Take 1 tablet (1 mg total) by mouth daily. 10/16/12  Yes Lennis Marion Downer, MD  hydroxychloroquine (PLAQUENIL) 200 MG tablet Take 600 mg by mouth daily.   Yes Historical Provider, MD  ibuprofen (ADVIL,MOTRIN) 200 MG tablet Take 800 mg by mouth every 6 (six) hours as needed for mild pain or moderate pain.  Yes Historical Provider, MD  methocarbamol (ROBAXIN) 500 MG tablet Take 500 mg by mouth every 6 (six) hours as needed for muscle spasms.    Yes Historical Provider, MD  oxyCODONE-acetaminophen (PERCOCET/ROXICET) 5-325 MG per tablet Take 1-2 tablets by mouth every 4 (four) hours as needed for moderate pain or severe pain.   Yes Historical Provider, MD  pantoprazole (PROTONIX) 40 MG tablet Take 1 tablet (40 mg total) by mouth  every morning. 01/08/13  Yes Lennis Marion Downer, MD  albuterol (PROVENTIL HFA;VENTOLIN HFA) 108 (90 BASE) MCG/ACT inhaler Inhale 2 puffs into the lungs every 6 (six) hours as needed. For shortness of breath 10/29/13   Lennis Marion Downer, MD  HYDROcodone-acetaminophen (NORCO/VICODIN) 5-325 MG per tablet Take 1-2 tablets by mouth every 6 (six) hours as needed. Patient not taking: Reported on 01/12/2015 09/23/14   Junius Creamer, NP  nicotine (NICODERM CQ - DOSED IN MG/24 HOURS) 14 mg/24hr patch Place 1 patch (14 mg total) onto the skin daily. Patient not taking: Reported on 09/22/2014 10/29/13   Gordy Levan, MD  predniSONE (DELTASONE) 10 MG tablet 6-5-4-3-2-1 Decrease by one pill each day Patient not taking: Reported on 01/12/2015 09/23/14   Junius Creamer, NP  traMADol (ULTRAM) 50 MG tablet Take 1 tablet (50 mg total) by mouth every 8 (eight) hours as needed. Patient not taking: Reported on 09/22/2014 08/19/14   Lance Bosch, NP   BP 135/84 mmHg  Pulse 98  Temp(Src) 99.6 F (37.6 C) (Oral)  Resp 18  SpO2 99%   Physical Exam  Constitutional: She appears well-developed and well-nourished. No distress.  HENT:  Head: Normocephalic and atraumatic.    Nose: Nose normal.  Mouth/Throat: Oropharynx is clear and moist. Normal dentition. No dental abscesses, uvula swelling or dental caries. No oropharyngeal exudate.  Right anterior neck fullness compared to the left.  Minimal trismus.  + tenderness at right chin/anterior neck that extends to right lateral neck.  No crepitus and airway is midline.  Has full neck ROM.  Bilateral ear exam benign.  No dental pathology noted.  Right soft palate swelling compared to left.  Tonsils without exudate and unable to visualize an abscess from oral exam.    Eyes: EOM are normal. Pupils are equal, round, and reactive to light.  Neck: Normal range of motion. Neck supple.  Cardiovascular: Normal rate, regular rhythm, normal heart sounds and intact distal pulses.   No murmur  heard. Pulmonary/Chest: Effort normal and breath sounds normal. No respiratory distress. She has no wheezes. She exhibits no tenderness.  Abdominal: Soft. There is no tenderness. There is no rebound and no guarding.  Musculoskeletal: Normal range of motion. She exhibits no tenderness.  Lymphadenopathy:    She has no cervical adenopathy.  Neurological: She is alert. No cranial nerve deficit. Coordination normal.  Skin: Skin is warm and dry. She is not diaphoretic.  Psychiatric: She has a normal mood and affect. Her behavior is normal. Judgment and thought content normal.  Nursing note and vitals reviewed.   ED Course  Procedures (including critical care time) Labs Review Labs Reviewed  CBC WITH DIFFERENTIAL/PLATELET - Abnormal; Notable for the following:    WBC 11.2 (*)    Hemoglobin 11.5 (*)    HCT 35.9 (*)    MCH 25.8 (*)    Neutro Abs 8.6 (*)    All other components within normal limits  BASIC METABOLIC PANEL - Abnormal; Notable for the following:    Glucose, Bld 108 (*)  BUN <5 (*)    All other components within normal limits  SEDIMENTATION RATE    Imaging Review Ct Soft Tissue Neck W Contrast  01/12/2015   CLINICAL DATA:  Right-sided ear, throat, and upper chest pain. Difficulty swallowing.  EXAM: CT NECK WITH CONTRAST  TECHNIQUE: Multidetector CT imaging of the neck was performed using the standard protocol following the bolus administration of intravenous contrast.  CONTRAST:  23mL OMNIPAQUE IOHEXOL 300 MG/ML  SOLN  COMPARISON:  10/12/2013  FINDINGS: Pharynx and larynx: Chronic prominent enhancement of the adenoid which is symmetric and considered inflammatory. There is reactive submucosal edema in the right oropharynx and hypopharynx with distortion of the airway but no critical stenosis. There is diffuse retropharyngeal edema the from the level of the oral cavity to the upper esophagus. In the right lateral retropharynx is a 8 mm low-density collection with discontinuous  mural enhancement. Additional bilateral non suppurative enlarged retropharyngeal lymph nodes, which were also seen previously. There is bilateral cervical lymphadenopathy with mild haziness of the surrounding fat. The pattern is similar to previous; No progression of these findings as would be expected for a nasopharyngeal malignancy.  Salivary glands: Negative  Thyroid: Negative  Lymph nodes: As above  Vascular: The major vessels are patent.  Limited intracranial: Negative  Visualized orbits: Negative  Mastoids and visualized paranasal sinuses: Mild inflammatory mucosal thickening in the paranasal sinuses. No sinus or visible mastoid effusion.  Skeleton: No acute findings  Upper chest: No apical pneumonia.  IMPRESSION: 1. 8 mm right lateral retropharynx pre-abscess from suppurative lateral retropharyngeal adenopathy. Upper retropharyngeal phlegmon and edema with pharyngeal distortion and mild narrowing. 2. Chronic, symmetric adenoid thickening. 3. Reactive appearing lateral neck adenopathy.   Electronically Signed   By: Monte Fantasia M.D.   On: 01/12/2015 11:12     EKG Interpretation   Date/Time:  Tuesday January 12 2015 09:06:32 EDT Ventricular Rate:  92 PR Interval:  194 QRS Duration: 83 QT Interval:  358 QTC Calculation: 443 R Axis:   27 Text Interpretation:  Sinus rhythm Ventricular premature complex Confirmed  by Hazle Coca [phone]) on 01/12/2015 10:12:05 AM      MDM   Final diagnoses:  Neck pain  Neck swelling  Retropharyngeal abscess   Pt is a 49 yo F with hx of Lupus who presents with right neck pain.  Cough x 3 days then developed a sore throat that was worse on the anterior right side.   Swelling at the right neck increased, and now she has throat pain with swallowing and coughing.  Afebrile. Denies hoarse voice, difficulty breathing, or drooling.  She is able to swallow but it is painful.    Patient appears nontoxic.  She has right anterior neck fullness compared to the left.   Minimal trismus.  She has tenderness at right chin/anterior neck that extends to right lateral neck.  Right soft palate swelling compared to left.  Concerning for possible developing right sided abscess.    Labs ordered.  Given pain control.  Will obtain a CT neck with contrast to look for infection vs mass.     CT scan shows a right 8 mm retropharyngeal abscess.   Patient appears well, is hemodynamically stable and protecting her airway.  ENT called to discuss close follow up vs admission for obs.    Spoke with ENT Dr. Redmond Baseman at 1130 who looked at the imaging as well.   Suggested IV clinda and decadron then dc with PO clinda F/u Thursday with Dr.  Redmond Baseman.    Given one dose of clinda IV and decadron in the ED.  Patient remained stable and in NAD.  Tolerating PO intake.   Stressed the importance of Abx therapy and keeping her follow up appointment.  Advised tylenol and motrin for pain/swelling.  Given ED return precautions and all questions were answered prior to dc home in stable condition.   Patient was seen with ED Attending, Dr. Lucia Gaskins, MD   Tori Milks, MD 01/13/15 [id number]  Quintella Reichert, MD 01/14/15 1022

## 2015-01-12 NOTE — ED Notes (Signed)
Assisted patient to rest room. Urine sample collected and placed bedside.

## 2015-01-12 NOTE — Consult Note (Signed)
Telephone Note: Spoke with ER resident about patient and personally reviewed the CT scan.  Few days of right-sided sore throat and cough, no airway symptoms.  Has lupus but not on any immunosuppressive medications.  No diabetes.  In reviewing the scan, there is an 8 mm fluid collection in the right peritonsillar/retropharyngeal region with surrounding soft tissue swelling and other adenopathy.  The fluid pocket is probably too small to drain effectively.  I recommended treatment there with IV clindamycin and Decadron and discharge on oral clindamycin to follow-up with me in 2-3 days.

## 2015-02-18 ENCOUNTER — Ambulatory Visit: Payer: Medicaid Other

## 2015-02-26 ENCOUNTER — Other Ambulatory Visit: Payer: Self-pay | Admitting: Internal Medicine

## 2015-02-26 DIAGNOSIS — K219 Gastro-esophageal reflux disease without esophagitis: Principal | ICD-10-CM

## 2015-02-26 DIAGNOSIS — IMO0001 Reserved for inherently not codable concepts without codable children: Secondary | ICD-10-CM

## 2015-03-04 ENCOUNTER — Ambulatory Visit
Admission: RE | Admit: 2015-03-04 | Discharge: 2015-03-04 | Disposition: A | Discharge status: Home / Self Care | Payer: Medicaid Other | Source: Ambulatory Visit | Attending: Internal Medicine | Admitting: Internal Medicine

## 2015-03-04 DIAGNOSIS — IMO0001 Reserved for inherently not codable concepts without codable children: Secondary | ICD-10-CM

## 2015-03-04 DIAGNOSIS — K219 Gastro-esophageal reflux disease without esophagitis: Principal | ICD-10-CM

## 2015-04-27 ENCOUNTER — Telehealth: Payer: Self-pay

## 2015-04-27 NOTE — Telephone Encounter (Addendum)
Left a message requesting Dana Kennedy call back to Dana Kennedy office  To discuss symptoms she is having to see  About an appointment with Dana Kennedy or another physician in the practice as Dana Kennedy is working 2 days a week and she could be seen sooner if needed by another provider. Dana Kennedy states that she is bruising all over and feels that her platelets may be low.

## 2015-04-29 ENCOUNTER — Other Ambulatory Visit: Payer: Self-pay

## 2015-04-29 DIAGNOSIS — D693 Immune thrombocytopenic purpura: Secondary | ICD-10-CM

## 2015-04-29 NOTE — Telephone Encounter (Signed)
Left a message for Dana Kennedy to see if she could come in tomorrow 04-30-15 at 1145 for labs and wait for the results as Dr. Marko Plume will be seeing patients and will review the labs.

## 2015-04-29 NOTE — Telephone Encounter (Signed)
Ms. Dana Kennedy will come in for labs tomorrow 04-30-15 at 1145 and wait for results. She stated that she has small areas of bruising on her legs. Onset ~3 days ago. She is currently on Day 9 of her menstrual cycle. Her cycle is usually 7 days.  She is also passing some clots and bleeding is heavier then normal for her.

## 2015-04-30 ENCOUNTER — Other Ambulatory Visit (HOSPITAL_BASED_OUTPATIENT_CLINIC_OR_DEPARTMENT_OTHER): Payer: Medicaid Other

## 2015-04-30 ENCOUNTER — Other Ambulatory Visit: Payer: Self-pay | Admitting: Oncology

## 2015-04-30 ENCOUNTER — Telehealth: Payer: Self-pay

## 2015-04-30 DIAGNOSIS — D693 Immune thrombocytopenic purpura: Secondary | ICD-10-CM | POA: Diagnosis present

## 2015-04-30 LAB — CBC WITH DIFFERENTIAL/PLATELET
BASO%: 0.9 % (ref 0.0–2.0)
Basophils Absolute: 0.1 10*3/uL (ref 0.0–0.1)
EOS%: 2.7 % (ref 0.0–7.0)
Eosinophils Absolute: 0.2 10*3/uL (ref 0.0–0.5)
HEMATOCRIT: 40.5 % (ref 34.8–46.6)
HGB: 12.8 g/dL (ref 11.6–15.9)
LYMPH#: 1.7 10*3/uL (ref 0.9–3.3)
LYMPH%: 26.4 % (ref 14.0–49.7)
MCH: 25.6 pg (ref 25.1–34.0)
MCHC: 31.6 g/dL (ref 31.5–36.0)
MCV: 81 fL (ref 79.5–101.0)
MONO#: 0.6 10*3/uL (ref 0.1–0.9)
MONO%: 9.3 % (ref 0.0–14.0)
NEUT#: 3.9 10*3/uL (ref 1.5–6.5)
NEUT%: 60.7 % (ref 38.4–76.8)
PLATELETS: 346 10*3/uL (ref 145–400)
RBC: 4.99 10*6/uL (ref 3.70–5.45)
RDW: 16.7 % — ABNORMAL HIGH (ref 11.2–14.5)
WBC: 6.4 10*3/uL (ref 3.9–10.3)

## 2015-04-30 NOTE — Telephone Encounter (Signed)
Told Dana Kennedy that her counts were good.today per Dr. Marko Plume. Hgb = 12.8;HCT = 40.5; Platelets = 346K. Inquired if Dana Kennedy has been using ASA or NSAID. She stated that she has been using Alka seltzer and aleve daily.  Told her that the increased use of theses  Medications is contributing to her bruising.   She stated that she will not use these regularly.   Dr. Marko Plume that she call her GYN regarding heavier menstrual bleeding. Dana Kennedy verbalized understanding.   Told Dana Kennedy that Dr. Marko Plume would like to see her in 4-6 months.  Escorted Dana Kennedy to the scheduling department.

## 2015-06-11 ENCOUNTER — Ambulatory Visit: Payer: Medicaid Other

## 2015-07-05 ENCOUNTER — Other Ambulatory Visit: Payer: Self-pay | Admitting: Internal Medicine

## 2015-07-05 ENCOUNTER — Ambulatory Visit
Admission: RE | Admit: 2015-07-05 | Discharge: 2015-07-05 | Disposition: A | Discharge status: Home / Self Care | Payer: Medicaid Other | Source: Ambulatory Visit | Attending: Internal Medicine | Admitting: Internal Medicine

## 2015-07-05 DIAGNOSIS — J40 Bronchitis, not specified as acute or chronic: Secondary | ICD-10-CM

## 2015-08-13 ENCOUNTER — Telehealth: Payer: Self-pay | Admitting: Oncology

## 2015-08-13 NOTE — Telephone Encounter (Signed)
Mailed a letter and calendar to patient for 2017 appointments

## 2015-10-21 ENCOUNTER — Other Ambulatory Visit: Payer: Self-pay | Admitting: Oncology

## 2015-10-21 ENCOUNTER — Telehealth: Payer: Self-pay | Admitting: Oncology

## 2015-10-21 NOTE — Telephone Encounter (Signed)
Not a valid number,moved her appointment to [facility] and mailed a calendar to her

## 2015-11-01 ENCOUNTER — Encounter: Payer: Self-pay | Admitting: Oncology

## 2015-11-01 ENCOUNTER — Ambulatory Visit (HOSPITAL_BASED_OUTPATIENT_CLINIC_OR_DEPARTMENT_OTHER): Payer: Medicaid Other | Admitting: Oncology

## 2015-11-01 ENCOUNTER — Ambulatory Visit: Payer: Medicaid Other | Admitting: Oncology

## 2015-11-01 ENCOUNTER — Other Ambulatory Visit (HOSPITAL_BASED_OUTPATIENT_CLINIC_OR_DEPARTMENT_OTHER): Payer: Self-pay

## 2015-11-01 ENCOUNTER — Other Ambulatory Visit: Payer: Medicaid Other

## 2015-11-01 ENCOUNTER — Telehealth: Payer: Self-pay | Admitting: Oncology

## 2015-11-01 VITALS — BP 129/88 | HR 83 | Temp 98.2°F | Resp 18 | Ht 65.0 in | Wt 203.8 lb

## 2015-11-01 DIAGNOSIS — D693 Immune thrombocytopenic purpura: Secondary | ICD-10-CM

## 2015-11-01 DIAGNOSIS — M25642 Stiffness of left hand, not elsewhere classified: Secondary | ICD-10-CM

## 2015-11-01 DIAGNOSIS — Z720 Tobacco use: Secondary | ICD-10-CM

## 2015-11-01 DIAGNOSIS — M329 Systemic lupus erythematosus, unspecified: Secondary | ICD-10-CM

## 2015-11-01 DIAGNOSIS — M25461 Effusion, right knee: Secondary | ICD-10-CM

## 2015-11-01 LAB — CBC WITH DIFFERENTIAL/PLATELET
BASO%: 1.1 % (ref 0.0–2.0)
Basophils Absolute: 0.1 10*3/uL (ref 0.0–0.1)
EOS%: 2.6 % (ref 0.0–7.0)
Eosinophils Absolute: 0.2 10*3/uL (ref 0.0–0.5)
HCT: 40.6 % (ref 34.8–46.6)
HGB: 13 g/dL (ref 11.6–15.9)
LYMPH%: 31.8 % (ref 14.0–49.7)
MCH: 26.4 pg (ref 25.1–34.0)
MCHC: 32.1 g/dL (ref 31.5–36.0)
MCV: 82.4 fL (ref 79.5–101.0)
MONO#: 0.6 10*3/uL (ref 0.1–0.9)
MONO%: 9.8 % (ref 0.0–14.0)
NEUT#: 3.2 10*3/uL (ref 1.5–6.5)
NEUT%: 54.7 % (ref 38.4–76.8)
PLATELETS: 330 10*3/uL (ref 145–400)
RBC: 4.93 10*6/uL (ref 3.70–5.45)
RDW: 15.6 % — ABNORMAL HIGH (ref 11.2–14.5)
WBC: 5.9 10*3/uL (ref 3.9–10.3)
lymph#: 1.9 10*3/uL (ref 0.9–3.3)

## 2015-11-01 LAB — MORPHOLOGY
PLT EST: ADEQUATE
Platelet Morphology: NORMAL
RBC COMMENTS: NORMAL

## 2015-11-01 MED ORDER — MAGIC MOUTHWASH W/LIDOCAINE: 5.0000 mL | Freq: Four times a day (QID) | ORAL | Status: DC | PRN

## 2015-11-01 NOTE — Progress Notes (Signed)
No treatment scheduled as of today.

## 2015-11-01 NOTE — Progress Notes (Signed)
OFFICE PROGRESS NOTE   11/01/2015   Physicians:W.Truslow, E.Avbuerre. 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   The patient has not had a visit with [place] since 10/2013. She has been doing well since then. Reports bilateral knee swelling which has been ongoing. Remains on Plaquenil for lupus. Denies recent fevers, bruising, and bleeding. Reports that she gets mouth sores from time to time and has old Magic mouthwash which she uses for this. She is requesting a refill of this medication today. Reports a recent mammogram about 3 weeks ago which was negative. I do not have these results available to me today.    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 She was treated then with steroids/ IVIG without improvement, and eventually resolved the thrombocytopenia with Rituxan. 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 Platelet count improved rapidly with short course prednisone Jan 2014. She presented to [facility] ED on 12-15-12 with 5-7 days of bruising and oral bleeding, with platelets <5k, hgb 12.2 and WBC 5.0. She had not been compliant with Plaquenil by Dr Charlestine Night for last 3 month prescription. She was begun on steroids with improvement in platelets up to 47k by 12-18-12. She had iron deficiency documented in Feb 2014.and received IV feraheme during that hospitalization. CBC on 12-24-12 had WBC 12.8 on steroids, Hgb 11.6 and plt up to 164k. With history of AVN on left, steroids were tapered quickly, with prompt drop in platelets. Rituxan was resumed April 2014 with 2 treatments thru 01-24-2013 (mild rate-related rituxan  reaction x1) and improvement in platelet count, which has maintained since then. She did not have bleeding complications with tonsillectomy 10-08-13. She has had no further hemolytic anemia since initial presentation.   Review of systems as above. Remainder of 10 point Review of Systems negative.  Objective:  Vital signs in last 24 hours:  BP 129/88 mmHg  Pulse 83  Temp(Src) 98.2 F (36.8 C) (Oral)  Resp 18  Ht 5\' 5"  (1.651 m)  Wt 203 lb 12.8 oz (92.443 kg)  BMI 33.91 kg/m2  SpO2 100%  Alert, oriented and appropriate. Ambulatory without difficulty.  Weight is up 11 lbs. over a 2 year period of time.  HEENT:PERRL, sclerae not icteric. Oral mucosa moist without lesions, posterior pharynx clear, surgical areas seem to be healing. Neck supple. No JVD.  Lymphatics:no cervical,suraclavicular, axillary or inguinal adenopathy Resp: clear to auscultation bilaterally and normal percussion bilaterally Cardio: regular rate and rhythm. No gallop. GI: soft, nontender, not distended, no mass or organomegaly. Normally active bowel sounds.  Musculoskeletal/ Extremities: without pitting edema, cords, tenderness Skin without rash, ecchymosis, petechiae Psych: appropriate mood and affect. Neuro nonfocal.  Lab Results:  Results for orders placed or performed in visit on 11/01/15  CBC with Differential  Result Value Ref Range   WBC 5.9 3.9 - 10.3 10e3/uL   NEUT# 3.2 1.5 - 6.5 10e3/uL   HGB 13.0 11.6 - 15.9 g/dL   HCT 40.6 34.8 - 46.6 %   Platelets 330 145 - 400 10e3/uL   MCV 82.4 79.5 - 101.0 fL   MCH 26.4 25.1 - 34.0 pg   MCHC  32.1 31.5 - 36.0 g/dL   RBC 4.93 3.70 - 5.45 10e6/uL   RDW 15.6 (H) 11.2 - 14.5 %   lymph# 1.9 0.9 - 3.3 10e3/uL   MONO# 0.6 0.1 - 0.9 10e3/uL   Eosinophils Absolute 0.2 0.0 - 0.5 10e3/uL   Basophils Absolute 0.1 0.0 - 0.1 10e3/uL   NEUT% 54.7 38.4 - 76.8 %   LYMPH% 31.8 14.0 - 49.7 %   MONO% 9.8 0.0 - 14.0 %   EOS% 2.6 0.0 - 7.0 %   BASO% 1.1 0.0 - 2.0 %   Morphology  Result Value Ref Range   RBC Comments Within Normal Limits Within Normal Limits   White Cell Comments C/W auto diff    PLT EST Adequate Adequate   Platelet Morphology Within Normal Limits Within Normal Limits     Studies/Results:  PATHOLOGY Collected: 10/08/2013  Accession: [id number] Received: 10/08/2013 Thomes Lolling, MD RT OF SURGICAL PATHOLOGYNAL DIAGNOSIS Diagnosis 1. Tonsil, right - LYMPHOID HYPERPLASIA ASSOCIATED WITH ACUTE INFLAMMATION. 2. Tonsil, left - LYMPHOID HYPERPLASIA ASSOCIATED WITH ACUTE INFLAMMATION.    CT NECK WITH CONTRAST  TECHNIQUE:  Multidetector CT imaging of the neck was performed using the  standard protocol following the bolus administration of intravenous  contrast.  CONTRAST: 45mL OMNIPAQUE IOHEXOL 300 MG/ML SOLN  COMPARISON: None.  FINDINGS:  Post recent palatine had tonsillectomy. Gas and fluid with  surrounding hyperemia seen at the tonsillectomy site. It is possible  this represents expected postoperative changes although superimposed  infection not excluded given the patient's history.  Gas is seen medial to the left parotid gland extending into the left  temporomandibular joint. Small amount of gas posterior lateral to  the left maxillary sinus and trapped by the left parotid duct. The  origin of this gas is indeterminate. Although there is no evidence  of fluid in the parapharyngeal space, the presence of gas in recent  postoperative state raises possibility of a pinpoint leak with  escape of gas.  Prominence of lymphoid tissue of Waldeyer's ring. Adenopathy  throughout the neck largest in the level 2 region measuring up to  2.1 x 1.3 cm. Left lateral retropharyngeal noted is enlarged.  Inflamed appearing uvula. These findings are suggestive of  infection.  Small amount of fluid in the posterior superior nasopharynx may  represent pooled secretions without drainable abscess.  Cervical spondylotic changes most notable  C5-6.  Lung apices clear.  Moderate mucosal thickening maxillary sinuses. Almost complete  opacification visualized ethmoid sinus air cells. No obvious orbital  extension.  No evidence of septic thrombophlebitis.  Medial displacement common carotid arteries bilaterally.  IMPRESSION:  Post recent palatine had tonsillectomy. Gas and fluid with  surrounding hyperemia seen at the tonsillectomy site. It is possible  this represents expected postoperative changes although superimposed  infection not excluded given the patient's history.  Gas is seen medial to the left parotid gland extending into the left  temporomandibular joint. Small amount of gas posterior lateral to  the left maxillary sinus and trapped by the left parotid duct. The  origin of this gas is indeterminate. Although there is no evidence  of fluid in the parapharyngeal space, the presence of gas in recent  postoperative state raises possibility of a pinpoint leak with  escape of gas.  Prominence of lymphoid tissue of Waldeyer's ring. Adenopathy  throughout the neck largest in the level 2 region measuring up to  2.1 x 1.3 cm. Left lateral retropharyngeal noted is enlarged.  Inflamed appearing uvula. These  findings are suggestive of  infection.  Moderate mucosal thickening maxillary sinuses. Almost complete  opacification visualized ethmoid sinus air cells.    Medications: I have reviewed the patient's current medications. We will do one refill on her Magic mouthwash.  DISCUSSION: patient knows to contact us for unusual bruising or bleeding. Despite that her counts have been stable, the patient wishes to continue to follow up at the [facility]. She requests to be seen every 6 months.  Assessment/Plan: 1. 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 Good response to brief course Rituxan spring 2014. Counts remain stable. Marland Kitchen2.SLE followed by Dr Charlestine Night, continuing Plaquenil.  3.prolonged,  severe issues with recurrent sinusitis, tonsillitis, bronchitis since I have known her: now post tonsillectomy Jan 2015.  4. Long cigarette use, resumed but again wants to quit. Disucssed, encouraged cessation especially with respiratory issues. Nicotine patches. 5.post left hip replacement for avascular necrosis, fall 2013. She is at risk for similar problems right hip if steroids required in future  6. Iron deficiency anemia by labs Feb 2014: post IV feraheme 12-17-12.Hgb and MCV normal now.  74. Death of 41 yo son in [place] 03-25-2013  8.flu vaccine done   Patient is in agreement with discussion and plan.     Mikey Bussing, NP   11/01/2015, 1:07 PM

## 2015-11-01 NOTE — Telephone Encounter (Signed)
per pof to sch pt appt-gave pt copy of avs °

## 2015-11-02 ENCOUNTER — Telehealth: Payer: Self-pay

## 2015-11-02 NOTE — Telephone Encounter (Signed)
Told Dana Kennedy that Dr. Marko Plume was pleased to see her counts were good at visit yesterday as noted below.

## 2015-11-02 NOTE — Telephone Encounter (Signed)
-----   Message from Gordy Levan, MD sent at 11/02/2015  1:32 PM EST ----- Labs seen and need follow up please let her know blood counts are good, including platelets

## 2016-04-30 ENCOUNTER — Other Ambulatory Visit: Payer: Self-pay | Admitting: Oncology

## 2016-05-01 ENCOUNTER — Ambulatory Visit (HOSPITAL_BASED_OUTPATIENT_CLINIC_OR_DEPARTMENT_OTHER): Payer: Self-pay | Admitting: Oncology

## 2016-05-01 ENCOUNTER — Encounter: Payer: Self-pay | Admitting: Oncology

## 2016-05-01 ENCOUNTER — Telehealth: Payer: Self-pay | Admitting: Oncology

## 2016-05-01 ENCOUNTER — Other Ambulatory Visit: Payer: Self-pay | Admitting: Oncology

## 2016-05-01 ENCOUNTER — Other Ambulatory Visit (HOSPITAL_BASED_OUTPATIENT_CLINIC_OR_DEPARTMENT_OTHER): Payer: Self-pay

## 2016-05-01 VITALS — BP 124/85 | HR 59 | Temp 98.4°F | Resp 18 | Ht 65.0 in | Wt 200.1 lb

## 2016-05-01 DIAGNOSIS — Z8739 Personal history of other diseases of the musculoskeletal system and connective tissue: Secondary | ICD-10-CM

## 2016-05-01 DIAGNOSIS — R058 Other specified cough: Secondary | ICD-10-CM

## 2016-05-01 DIAGNOSIS — M329 Systemic lupus erythematosus, unspecified: Secondary | ICD-10-CM

## 2016-05-01 DIAGNOSIS — D693 Immune thrombocytopenic purpura: Secondary | ICD-10-CM

## 2016-05-01 DIAGNOSIS — Z9089 Acquired absence of other organs: Secondary | ICD-10-CM

## 2016-05-01 DIAGNOSIS — Z720 Tobacco use: Secondary | ICD-10-CM

## 2016-05-01 DIAGNOSIS — R05 Cough: Secondary | ICD-10-CM

## 2016-05-01 DIAGNOSIS — D6941 Evans syndrome: Secondary | ICD-10-CM

## 2016-05-01 LAB — CBC & DIFF AND RETIC
BASO%: 0.3 % (ref 0.0–2.0)
Basophils Absolute: 0 10*3/uL (ref 0.0–0.1)
EOS%: 1.8 % (ref 0.0–7.0)
Eosinophils Absolute: 0.1 10*3/uL (ref 0.0–0.5)
HCT: 40.3 % (ref 34.8–46.6)
HGB: 13.3 g/dL (ref 11.6–15.9)
Immature Retic Fract: 5.2 % (ref 1.60–10.00)
LYMPH#: 2.2 10*3/uL (ref 0.9–3.3)
LYMPH%: 32.2 % (ref 14.0–49.7)
MCH: 27.3 pg (ref 25.1–34.0)
MCHC: 33 g/dL (ref 31.5–36.0)
MCV: 82.6 fL (ref 79.5–101.0)
MONO#: 0.7 10*3/uL (ref 0.1–0.9)
MONO%: 10.9 % (ref 0.0–14.0)
NEUT%: 54.8 % (ref 38.4–76.8)
NEUTROS ABS: 3.7 10*3/uL (ref 1.5–6.5)
Platelets: 331 10*3/uL (ref 145–400)
RBC: 4.88 10*6/uL (ref 3.70–5.45)
RDW: 14.5 % (ref 11.2–14.5)
RETIC %: 1.06 % (ref 0.70–2.10)
RETIC CT ABS: 51.73 10*3/uL (ref 33.70–90.70)
WBC: 6.8 10*3/uL (ref 3.9–10.3)

## 2016-05-01 NOTE — Progress Notes (Signed)
OFFICE PROGRESS NOTE   May 01, 2016   Physicians:W.Truslow, E.Avbuerre. 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 She has had no recurrence of the hematologic problems.  Her Medicaid expired several months ago, and patient reports that she can be seen only at this clinic until Medicaid is reinstated (in conjuction with long term disability?)  in next 2-3 weeks. She has been off of all chronic meds, including lupus meds and reported pain meds,  since Dec 2017 due to Medicaid problems.    Patient denies bleeding, bruising, anemia symptoms, and fever or infectious illness in last months. She complains of generalized pain since off pain medications, but understands that those will not be prescribed from this office. She has had cough occasionally productive of dark sputum in past 2 weeks, no increased SOB or wheezing, continues to smoke 1 pack cigarettes every 3 days, discussed and strongly encourage smoking cessation now. Appetite is good. Bowels move ~ 4x daily which has been baseline for several years; she has not tried lactose free diet, does eat milk products daily, discussed and encouraged her to try this for at least several days. She denies abdominal pain, blood with stools, N/V, swelling LE, changes in breasts. No sinus or throat symptoms as had been very symptomatic prior to tonsillectomy 09-2013. Energy would be good if not for generalized arthritis type pain.  Remainder of 10 point Review of Systems negative.    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 She was treated with steroids/ IVIG without improvement, and eventually resolved the thrombocytopenia with Rituxan. 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 Platelet count improved rapidly with short course prednisone Jan 2014. She presented to [facility] ED on 12-15-12 with 5-7 days of bruising and oral bleeding, with platelets <5k, hgb 12.2 and WBC 5.0. She had not been compliant with Plaquenil by Dr Charlestine Night for last 3 month prescription. She was begun on steroids with improvement in platelets up to 47k by 12-18-12. She had iron deficiency documented in Feb 2014.and received IV feraheme during that hospitalization. CBC on 12-24-12 had WBC 12.8 on steroids, Hgb 11.6 and plt up to 164k. With history of AVN on left, steroids were tapered quickly, with prompt drop in platelets. Rituxan was resumed April 2014 with 2 treatments thru 01-24-2013 (mild rate-related rituxan reaction x1) and improvement in platelet count, which has maintained since then. She did not have bleeding complications with tonsillectomy 10-08-13. She has had no further hemolytic anemia since initial presentation.  Objective:  Vital signs in last 24 hours:  BP 124/85 (BP Location: Right Arm, Patient Position: Sitting)   Pulse (!) 59   Temp 98.4 F (36.9 C) (Oral)   Resp 18   Ht 5\' 5"  (1.651 m)   Wt 200 lb 1.6 oz (90.8 kg)   SpO2 100%   BMI 33.30 kg/m  Weight down 3 lbs. Alert, oriented and appropriate. Ambulatory without assistance. No coughing during visit. Looks comfortable.  No alopecia  HEENT:PERRL, sclerae not icteric. Oral mucosa moist without lesions, posterior pharynx clear.  Neck supple. No JVD.  Lymphatics:no cervical,supraclavicular, axillary or inguinal adenopathy Resp: somewhat dimnished BS otherwise clear to auscultation bilaterally and normal percussion  bilaterally Cardio: regular rate and rhythm. No gallop. GI: abdomen obese, soft, nontender, not distended, no appreciable mass or organomegaly. Normally active bowel sounds.  Musculoskeletal/ Extremities: without pitting edema, cords, tenderness. No clear joint deformities,  effusion, erythema Neuro:  nonfocal  PSYCH appropriate mood and affect Skin without rash, ecchymosis, petechiae Breasts: without dominant mass, skin or nipple findings. Axillae benign.   Lab Results:  Results for orders placed or performed in visit on 05/01/16  CBC & Diff and Retic  Result Value Ref Range   WBC 6.8 3.9 - 10.3 10e3/uL   NEUT# 3.7 1.5 - 6.5 10e3/uL   HGB 13.3 11.6 - 15.9 g/dL   HCT 40.3 34.8 - 46.6 %   Platelets 331 145 - 400 10e3/uL   MCV 82.6 79.5 - 101.0 fL   MCH 27.3 25.1 - 34.0 pg   MCHC 33.0 31.5 - 36.0 g/dL   RBC 4.88 3.70 - 5.45 10e6/uL   RDW 14.5 11.2 - 14.5 %   lymph# 2.2 0.9 - 3.3 10e3/uL   MONO# 0.7 0.1 - 0.9 10e3/uL   Eosinophils Absolute 0.1 0.0 - 0.5 10e3/uL   Basophils Absolute 0.0 0.0 - 0.1 10e3/uL   NEUT% 54.8 38.4 - 76.8 %   LYMPH% 32.2 14.0 - 49.7 %   MONO% 10.9 0.0 - 14.0 %   EOS% 1.8 0.0 - 7.0 %   BASO% 0.3 0.0 - 2.0 %   Retic % 1.06 0.70 - 2.10 %   Retic Ct Abs 51.73 33.70 - 90.70 10e3/uL   Immature Retic Fract 5.20 1.60 - 10.00 %     Studies/Results:  CXR obtained after visit due to productive cough in setting of long and ongoing tobacco: (this MD ordered CXR with cc Dr Jackson Latino, however it is listed in EMR as Avbuerre ordering provider)  CHEST  2 VIEW  05-01-16  COMPARISON:  None.  FINDINGS: Mediastinum hilar structures normal. Low lung volumes with mild bibasilar atelectasis and/or infiltrates. Cardiomegaly with normal pulmonary vascularity. No acute bony abnormality .  IMPRESSION: Low lung volumes with mild bibasilar atelectasis and/or infiltrates   Note mammograms reportedly done 10-2015, not in this EMR.  Medications: I have reviewed the patient's current medications list, none of which she is taking.  DISCUSSION Patient understands that I am not correct MD to refill her lupus meds or chronic pain meds.  I have encouraged her to call PCP office to set up appointment in ~ 3 weeks, as she understands that Medicaid  will be reinstated in ~ 2 weeks.  Tobacco cessation strongly encouraged.  Following CXR information available, I have tried to reach patient by phone # listed x2, without success.  Will cc all of this information to Dr Jackson Latino  Assessment/Plan: 1. 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 Good response to brief course Rituxan spring 2014. Counts remain stable.  She can be seen at this office at any time if concerns, but does not need scheduled follow up here at present. .2.SLE followed by Dr Charlestine Night, off of  Plaquenil since 08-2015 due to Medicaid problems. Cc this note also Dr Charlestine Night, patient anticipates Medicaid to be available again in ~ 2 weeks 3.prolonged, severe issues with recurrent sinusitis, tonsillitis, bronchitis prior to tonsillectomy Jan 2015, much better since then 4. Long cigarette use:  Disucssed, encouraged cessation especially with respiratory issues. Nicotine patches helpful previously, did stop smoking for some time with those. She may want patches again when Medicaid available. CXR after visit today  as above. 5.post left hip replacement for avascular necrosis, fall 2013. She is at risk for similar problems right hip if steroids required in future  6. Iron deficiency anemia by labs Feb 2014: post IV feraheme 12-17-12.Hgb and MCV normal now.  67. Death of 20 yo son in [place] 2013/03/25  8.flu vaccine needed this fall, patient aware.  All questions answered. Time spent 20 min including >50% counseling and coordination of care. Cc Drs Jackson Latino and Toney Sang, MD   05/01/2016, 8:01 PM

## 2016-05-01 NOTE — Telephone Encounter (Signed)
Medical Oncology  Tried x2, 3 hrs apart, unable to reach patient on listed phone "subscriber is not in service". Unable to let her know overall unremarkable findings on CXR. Will sent the information to her PCP, as patient planned to schedule back to him upcoming.  Godfrey Pick, MD

## 2016-05-02 ENCOUNTER — Encounter: Payer: Self-pay | Admitting: Oncology

## 2016-05-02 DIAGNOSIS — D6941 Evans syndrome: Secondary | ICD-10-CM | POA: Insufficient documentation

## 2016-05-02 DIAGNOSIS — Z720 Tobacco use: Secondary | ICD-10-CM | POA: Insufficient documentation

## 2016-05-02 DIAGNOSIS — Z9089 Acquired absence of other organs: Secondary | ICD-10-CM | POA: Insufficient documentation

## 2016-05-02 DIAGNOSIS — Z8739 Personal history of other diseases of the musculoskeletal system and connective tissue: Secondary | ICD-10-CM | POA: Insufficient documentation

## 2016-05-08 ENCOUNTER — Telehealth: Payer: Self-pay

## 2016-05-08 NOTE — Telephone Encounter (Signed)
Dana Kennedy at [facility] returned my call. They have an appt availible for the pt Sept 8 at 0900 with Dr Jarold Song. Called pt with this information. She was appreciative. Told pt to call 48 hours in advance if need to reschedule appt.

## 2016-05-08 NOTE — Telephone Encounter (Signed)
Pt called stating the disability people said the process would take at least 60 days. Pt asked for referral to a  community clinic. [facility] and left voice message with their Eligibility Coordinator. Called pt back and gave her the information and phone number.

## 2016-05-26 ENCOUNTER — Ambulatory Visit: Payer: Self-pay | Admitting: Family Medicine

## 2016-06-16 LAB — PROCEDURE REPORT - SCANNED: Pap: ABNORMAL — AB

## 2016-06-19 ENCOUNTER — Encounter (HOSPITAL_COMMUNITY): Payer: Self-pay | Admitting: Emergency Medicine

## 2016-06-19 ENCOUNTER — Emergency Department (HOSPITAL_COMMUNITY)
Admission: EM | Admit: 2016-06-19 | Discharge: 2016-06-19 | Disposition: A | Discharge status: Home / Self Care | Payer: Medicaid Other | Attending: Emergency Medicine | Admitting: Emergency Medicine

## 2016-06-19 DIAGNOSIS — J45909 Unspecified asthma, uncomplicated: Secondary | ICD-10-CM | POA: Insufficient documentation

## 2016-06-19 DIAGNOSIS — Z5321 Procedure and treatment not carried out due to patient leaving prior to being seen by health care provider: Secondary | ICD-10-CM | POA: Diagnosis not present

## 2016-06-19 DIAGNOSIS — F17210 Nicotine dependence, cigarettes, uncomplicated: Secondary | ICD-10-CM | POA: Diagnosis not present

## 2016-06-19 DIAGNOSIS — R531 Weakness: Secondary | ICD-10-CM | POA: Diagnosis not present

## 2016-06-19 LAB — BASIC METABOLIC PANEL
Anion gap: 4 — ABNORMAL LOW (ref 5–15)
BUN: 7 mg/dL (ref 6–20)
CHLORIDE: 111 mmol/L (ref 101–111)
CO2: 27 mmol/L (ref 22–32)
CREATININE: 0.87 mg/dL (ref 0.44–1.00)
Calcium: 9.4 mg/dL (ref 8.9–10.3)
GFR calc Af Amer: >60 mL/min (ref >60)
GFR calc non Af Amer: >60 mL/min (ref >60)
Glucose, Bld: 85 mg/dL (ref 65–99)
Potassium: 4.6 mmol/L (ref 3.5–5.1)
SODIUM: 142 mmol/L (ref 135–145)

## 2016-06-19 LAB — CBC
HCT: 40.6 % (ref 36.0–46.0)
Hemoglobin: 12.9 g/dL (ref 12.0–15.0)
MCH: 27 pg (ref 26.0–34.0)
MCHC: 31.8 g/dL (ref 30.0–36.0)
MCV: 84.9 fL (ref 78.0–100.0)
PLATELETS: 345 10*3/uL (ref 150–400)
RBC: 4.78 MIL/uL (ref 3.87–5.11)
RDW: 14.8 % (ref 11.5–15.5)
WBC: 6.3 10*3/uL (ref 4.0–10.5)

## 2016-06-19 LAB — URINALYSIS, ROUTINE W REFLEX MICROSCOPIC
BILIRUBIN URINE: NEGATIVE
GLUCOSE, UA: NEGATIVE mg/dL
KETONES UR: NEGATIVE mg/dL
LEUKOCYTES UA: NEGATIVE
Nitrite: NEGATIVE
PROTEIN: NEGATIVE mg/dL
Specific Gravity, Urine: <1.005 — ABNORMAL LOW (ref 1.005–1.030)
pH: 5.5 (ref 5.0–8.0)

## 2016-06-19 LAB — URINE MICROSCOPIC-ADD ON
Bacteria, UA: NONE SEEN
WBC, UA: NONE SEEN WBC/hpf (ref 0–5)

## 2016-06-19 NOTE — ED Triage Notes (Signed)
Pt states 1 week she started having some left sided "weakness and pain" pt states her left side "cant move like she moves her right arm. Pt states its painful to lift arm. Pt has no drift in arm or leg. No other neuro deficits noted. Pt denies any dizziness or vision change. Pt alert and ox4.

## 2016-06-19 NOTE — ED Notes (Signed)
Pt does not respond when called for room x 3  

## 2016-08-30 ENCOUNTER — Ambulatory Visit (INDEPENDENT_AMBULATORY_CARE_PROVIDER_SITE_OTHER): Payer: Self-pay | Admitting: Orthopaedic Surgery

## 2017-04-04 ENCOUNTER — Emergency Department (HOSPITAL_COMMUNITY): Payer: Medicaid Other

## 2017-04-04 ENCOUNTER — Encounter (HOSPITAL_COMMUNITY): Payer: Self-pay | Admitting: Emergency Medicine

## 2017-04-04 ENCOUNTER — Emergency Department (HOSPITAL_COMMUNITY)
Admission: EM | Admit: 2017-04-04 | Discharge: 2017-04-04 | Disposition: A | Discharge status: Home / Self Care | Payer: Medicaid Other | Attending: Emergency Medicine | Admitting: Emergency Medicine

## 2017-04-04 DIAGNOSIS — J45909 Unspecified asthma, uncomplicated: Secondary | ICD-10-CM | POA: Diagnosis not present

## 2017-04-04 DIAGNOSIS — R0602 Shortness of breath: Secondary | ICD-10-CM | POA: Diagnosis present

## 2017-04-04 DIAGNOSIS — Z96642 Presence of left artificial hip joint: Secondary | ICD-10-CM | POA: Insufficient documentation

## 2017-04-04 DIAGNOSIS — F17210 Nicotine dependence, cigarettes, uncomplicated: Secondary | ICD-10-CM | POA: Diagnosis not present

## 2017-04-04 DIAGNOSIS — J4520 Mild intermittent asthma, uncomplicated: Secondary | ICD-10-CM

## 2017-04-04 DIAGNOSIS — Z79899 Other long term (current) drug therapy: Secondary | ICD-10-CM | POA: Diagnosis not present

## 2017-04-04 LAB — D-DIMER, QUANTITATIVE (NOT AT ARMC)

## 2017-04-04 LAB — CBC WITH DIFFERENTIAL/PLATELET
BASOS PCT: 0 %
Basophils Absolute: 0 10*3/uL (ref 0.0–0.1)
EOS ABS: 0.3 10*3/uL (ref 0.0–0.7)
Eosinophils Relative: 3 %
HCT: 38.4 % (ref 36.0–46.0)
HEMOGLOBIN: 12.7 g/dL (ref 12.0–15.0)
Lymphocytes Relative: 26 %
Lymphs Abs: 2.6 10*3/uL (ref 0.7–4.0)
MCH: 27.4 pg (ref 26.0–34.0)
MCHC: 33.1 g/dL (ref 30.0–36.0)
MCV: 82.9 fL (ref 78.0–100.0)
Monocytes Absolute: 0.8 10*3/uL (ref 0.1–1.0)
Monocytes Relative: 8 %
NEUTROS ABS: 6.2 10*3/uL (ref 1.7–7.7)
NEUTROS PCT: 63 %
Platelets: 367 10*3/uL (ref 150–400)
RBC: 4.63 MIL/uL (ref 3.87–5.11)
RDW: 14.3 % (ref 11.5–15.5)
WBC: 10 10*3/uL (ref 4.0–10.5)

## 2017-04-04 LAB — BASIC METABOLIC PANEL
Anion gap: 11 (ref 5–15)
BUN: 11 mg/dL (ref 6–20)
CHLORIDE: 106 mmol/L (ref 101–111)
CO2: 26 mmol/L (ref 22–32)
Calcium: 8.9 mg/dL (ref 8.9–10.3)
Creatinine, Ser: 0.62 mg/dL (ref 0.44–1.00)
GFR calc non Af Amer: >60 mL/min (ref >60)
Glucose, Bld: 101 mg/dL — ABNORMAL HIGH (ref 65–99)
POTASSIUM: 4.6 mmol/L (ref 3.5–5.1)
SODIUM: 143 mmol/L (ref 135–145)

## 2017-04-04 MED ORDER — SODIUM CHLORIDE 0.9 % IV SOLN
INTRAVENOUS | Status: DC
  Administered 2017-04-04: 12:00:00 via INTRAVENOUS

## 2017-04-04 MED ORDER — IPRATROPIUM BROMIDE 0.02 % IN SOLN
0.5000 mg | Freq: Once | RESPIRATORY_TRACT | Status: AC
  Administered 2017-04-04: 0.5 mg via RESPIRATORY_TRACT
  Filled 2017-04-04: qty 2.5

## 2017-04-04 MED ORDER — METHYLPREDNISOLONE SODIUM SUCC 125 MG IJ SOLR
125.0000 mg | Freq: Once | INTRAMUSCULAR | Status: AC
  Administered 2017-04-04: 125 mg via INTRAVENOUS
  Filled 2017-04-04: qty 2

## 2017-04-04 MED ORDER — BENZONATATE 100 MG PO CAPS
200.0000 mg | ORAL_CAPSULE | Freq: Once | ORAL | Status: AC
  Administered 2017-04-04: 200 mg via ORAL
  Filled 2017-04-04: qty 2

## 2017-04-04 MED ORDER — ALBUTEROL (5 MG/ML) CONTINUOUS INHALATION SOLN
10.0000 mg/h | INHALATION_SOLUTION | Freq: Once | RESPIRATORY_TRACT | Status: AC
  Administered 2017-04-04: 10 mg/h via RESPIRATORY_TRACT
  Filled 2017-04-04: qty 20

## 2017-04-04 MED ORDER — PREDNISONE 50 MG PO TABS: ORAL_TABLET | ORAL | 0 refills | Status: DC

## 2017-04-04 MED ORDER — BENZONATATE 100 MG PO CAPS: 100.0000 mg | ORAL_CAPSULE | Freq: Three times a day (TID) | ORAL | 0 refills | Status: DC

## 2017-04-04 MED ORDER — ALBUTEROL SULFATE HFA 108 (90 BASE) MCG/ACT IN AERS
2.0000 | INHALATION_SPRAY | RESPIRATORY_TRACT | Status: DC
  Administered 2017-04-04: 2 via RESPIRATORY_TRACT
  Filled 2017-04-04: qty 6.7

## 2017-04-04 NOTE — ED Notes (Signed)
ED Provider at bedside. 

## 2017-04-04 NOTE — ED Notes (Signed)
Pt returned from xray

## 2017-04-04 NOTE — ED Notes (Signed)
Respiratory called regarding breathing treatments

## 2017-04-04 NOTE — ED Triage Notes (Signed)
Per EMS pt complaint of lupus flare up related to out of medication for past week; pt also complaint of dry nonproductive cough.

## 2017-04-04 NOTE — ED Notes (Signed)
Pt reports white sputum with her cough and congestion that worsens at night

## 2017-04-04 NOTE — ED Provider Notes (Addendum)
[facility] DEPT Provider Note   CSN: [id number] Arrival date & time: 04/04/17  1856     History   Chief Complaint Chief Complaint  Patient presents with  . Joint Pain    HPI [name] is a 51 y.o. female.  51 y/o female with ho lupus here w/ diffuse joint pain and sob Also has h/o asthma and has non-productive cough w/ fever or chills No anginal or chf sx No recent fever or joint trauma Has been out of her usual meds Sx persistent for the last several days and nothing makes them better      Past Medical History:  Diagnosis Date  . Anemia   . Arthritis   . Asthma   . GERD (gastroesophageal reflux disease)   . Lupus     Patient Active Problem List   Diagnosis Date Noted  . 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 Continuous tobacco abuse 05/02/2016  . 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 Status post tonsillectomy 05/02/2016  . Dehydration 10/12/2013  . 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 Lupus (systemic lupus erythematosus) ([place]) 09/05/2012  . Avascular necrosis of hip ([place]) 06/14/2012    Past Surgical History:  Procedure Laterality Date  . stomach ulcer surgery  2008  . TONSILLECTOMY    . TONSILLECTOMY    . TOTAL HIP ARTHROPLASTY  06/14/2012   Procedure: TOTAL HIP ARTHROPLASTY ANTERIOR APPROACH;  Surgeon: Mcarthur Rossetti, MD;  Location: [facility] ORS;  Service: Orthopedics;  Laterality: Left;  Left Total Hip Arthroplasty, Anterior Approach C-Arm    OB History    No data available       Home Medications    Prior to Admission medications   Medication Sig Start Date End Date Taking? Authorizing Provider  aspirin-sod bicarb-citric acid (ALKA-SELTZER) 325 MG TBEF tablet Take 325 mg by mouth every 6 (six) hours as needed (cough).   Yes [provider]  guaifenesin (ROBITUSSIN) 100 MG/5ML syrup Take 200 mg by mouth 3 (three) times daily as needed for cough.   Yes [provider]   meloxicam (MOBIC) 7.5 MG tablet Take 7.5-15 mg by mouth daily. 03/17/17  Yes [provider]  albuterol (PROVENTIL HFA;VENTOLIN HFA) 108 (90 BASE) MCG/ACT inhaler Inhale 2 puffs into the lungs every 6 (six) hours as needed. For shortness of breath Patient not taking: Reported on 05/01/2016 10/29/13   Gordy Levan, MD  cetirizine (ZYRTEC) 10 MG tablet Take 1 tablet (10 mg total) by mouth daily. Patient not taking: Reported on 05/01/2016 11/05/12   Gordy Levan, MD  folic acid (FOLVITE) 1 MG tablet Take 1 tablet (1 mg total) by mouth daily. Patient not taking: Reported on 05/01/2016 10/16/12   Gordy Levan, MD  nicotine (NICODERM CQ - DOSED IN MG/24 HOURS) 14 mg/24hr patch Place 1 patch (14 mg total) onto the skin daily. Patient not taking: Reported on 05/01/2016 10/29/13   Gordy Levan, MD  pantoprazole (PROTONIX) 40 MG tablet Take 1 tablet (40 mg total) by mouth every morning. Patient not taking: Reported on 05/01/2016 01/08/13   Gordy Levan, MD    Family History Family History  Problem Relation Age of Onset  . Cancer Mother   . Hypertension Mother     Social History Social History  Substance Use Topics  . Smoking status: Current Every Day Smoker    Packs/day: 0.33    Years: 29.00    Types: Cigarettes    Last attempt  to quit: 10/08/2013  . Smokeless tobacco: Never Used  . Alcohol use Yes     Comment: occasionally     Allergies   Allegra [fexofenadine]; Aspirin; Eszopiclone; and Restoril [temazepam]   Review of Systems Review of Systems  All other systems reviewed and are negative.    Physical Exam Updated Vital Signs BP (!) 147/95 (BP Location: Left Arm)   Pulse 87   Temp 98.3 F (36.8 C) (Oral)   Resp 18   SpO2 92%   Physical Exam  Constitutional: She is oriented to person, place, and time. She appears well-developed and well-nourished.  Non-toxic appearance. No distress.  HENT:  Head: Normocephalic and atraumatic.  Eyes: Pupils are  equal, round, and reactive to light. Conjunctivae, EOM and lids are normal.  Neck: Normal range of motion. Neck supple. No tracheal deviation present. No thyroid mass present.  Cardiovascular: Normal rate, regular rhythm and normal heart sounds.  Exam reveals no gallop.   No murmur heard. Pulmonary/Chest: Effort normal. No stridor. No respiratory distress. She has decreased breath sounds in the right lower field and the left lower field. She has wheezes in the right lower field and the left lower field. She has no rhonchi. She has no rales.  Abdominal: Soft. Normal appearance and bowel sounds are normal. She exhibits no distension. There is no tenderness. There is no rebound and no CVA tenderness.  Musculoskeletal: Normal range of motion. She exhibits no edema or tenderness.  Neurological: She is alert and oriented to person, place, and time. She has normal strength. No cranial nerve deficit or sensory deficit. GCS eye subscore is 4. GCS verbal subscore is 5. GCS motor subscore is 6.  Skin: Skin is warm and dry. No abrasion and no rash noted.  Psychiatric: She has a normal mood and affect. Her speech is normal and behavior is normal.  Nursing note and vitals reviewed.    ED Treatments / Results  Labs (all labs ordered are listed, but only abnormal results are displayed) Labs Reviewed  CBC WITH DIFFERENTIAL/PLATELET  BASIC METABOLIC PANEL  D-DIMER, QUANTITATIVE (NOT AT [facility])    EKG  EKG Interpretation None       Radiology No results found.  Procedures Procedures (including critical care time)  Medications Ordered in ED Medications  0.9 %  sodium chloride infusion ( Intravenous New Bag/Given 04/04/17 1210)  albuterol (PROVENTIL,VENTOLIN) solution continuous neb (not administered)  ipratropium (ATROVENT) nebulizer solution 0.5 mg (not administered)  methylPREDNISolone sodium succinate (SOLU-MEDROL) 125 mg/2 mL injection 125 mg (125 mg Intravenous Given 04/04/17 1210)  benzonatate  (TESSALON) capsule 200 mg (200 mg Oral Given 04/04/17 1209)     Initial Impression / Assessment and Plan / ED Course  I have reviewed the triage vital signs and the nursing notes.  Pertinent labs & imaging results that were available during my care of the patient were reviewed by me and considered in my medical decision making (see chart for details).     Patient treated for presumptive asthma exacerbation. D-dimer is negative. Chest x-ray without acute infiltrate. Given albuterol along with Solu-Medrol and feels better. Lung exam repeated and her wheezing is improved. Will prescribe prednisone and give albuterol to go home with.  Final Clinical Impressions(s) / ED Diagnoses   Final diagnoses:  SOB (shortness of breath)    New Prescriptions New Prescriptions   No medications on file     Lacretia Leigh, MD 04/04/17 1444    Lacretia Leigh, MD 04/23/17 0221

## 2017-04-14 ENCOUNTER — Emergency Department (HOSPITAL_COMMUNITY)
Admission: EM | Admit: 2017-04-14 | Discharge: 2017-04-14 | Disposition: A | Discharge status: Home / Self Care | Payer: Medicaid Other | Attending: Emergency Medicine | Admitting: Emergency Medicine

## 2017-04-14 ENCOUNTER — Encounter (HOSPITAL_COMMUNITY): Payer: Self-pay | Admitting: Emergency Medicine

## 2017-04-14 ENCOUNTER — Emergency Department (HOSPITAL_COMMUNITY): Payer: Medicaid Other

## 2017-04-14 DIAGNOSIS — N3000 Acute cystitis without hematuria: Secondary | ICD-10-CM | POA: Diagnosis not present

## 2017-04-14 DIAGNOSIS — J45909 Unspecified asthma, uncomplicated: Secondary | ICD-10-CM | POA: Insufficient documentation

## 2017-04-14 DIAGNOSIS — J4541 Moderate persistent asthma with (acute) exacerbation: Secondary | ICD-10-CM | POA: Diagnosis not present

## 2017-04-14 DIAGNOSIS — Z79899 Other long term (current) drug therapy: Secondary | ICD-10-CM | POA: Diagnosis not present

## 2017-04-14 DIAGNOSIS — F17210 Nicotine dependence, cigarettes, uncomplicated: Secondary | ICD-10-CM | POA: Insufficient documentation

## 2017-04-14 DIAGNOSIS — Z96642 Presence of left artificial hip joint: Secondary | ICD-10-CM | POA: Insufficient documentation

## 2017-04-14 DIAGNOSIS — R0602 Shortness of breath: Secondary | ICD-10-CM | POA: Diagnosis present

## 2017-04-14 LAB — CBC WITH DIFFERENTIAL/PLATELET
BASOS ABS: 0 10*3/uL (ref 0.0–0.1)
Basophils Relative: 0 %
EOS PCT: 3 %
Eosinophils Absolute: 0.4 10*3/uL (ref 0.0–0.7)
HCT: 35.7 % — ABNORMAL LOW (ref 36.0–46.0)
Hemoglobin: 11.8 g/dL — ABNORMAL LOW (ref 12.0–15.0)
LYMPHS PCT: 23 %
Lymphs Abs: 2.9 10*3/uL (ref 0.7–4.0)
MCH: 27.4 pg (ref 26.0–34.0)
MCHC: 33.1 g/dL (ref 30.0–36.0)
MCV: 82.8 fL (ref 78.0–100.0)
Monocytes Absolute: 1.1 10*3/uL — ABNORMAL HIGH (ref 0.1–1.0)
Monocytes Relative: 9 %
Neutro Abs: 8.2 10*3/uL — ABNORMAL HIGH (ref 1.7–7.7)
Neutrophils Relative %: 65 %
PLATELETS: 362 10*3/uL (ref 150–400)
RBC: 4.31 MIL/uL (ref 3.87–5.11)
RDW: 14.3 % (ref 11.5–15.5)
WBC: 12.6 10*3/uL — ABNORMAL HIGH (ref 4.0–10.5)

## 2017-04-14 LAB — COMPREHENSIVE METABOLIC PANEL
ALBUMIN: 3.1 g/dL — AB (ref 3.5–5.0)
ALK PHOS: 63 U/L (ref 38–126)
ALT: 13 U/L — ABNORMAL LOW (ref 14–54)
ANION GAP: 8 (ref 5–15)
AST: 16 U/L (ref 15–41)
BILIRUBIN TOTAL: 0.3 mg/dL (ref 0.3–1.2)
BUN: 13 mg/dL (ref 6–20)
CALCIUM: 8.9 mg/dL (ref 8.9–10.3)
CO2: 24 mmol/L (ref 22–32)
Chloride: 106 mmol/L (ref 101–111)
Creatinine, Ser: 1.12 mg/dL — ABNORMAL HIGH (ref 0.44–1.00)
GFR calc non Af Amer: 57 mL/min — ABNORMAL LOW (ref >60)
Glucose, Bld: 133 mg/dL — ABNORMAL HIGH (ref 65–99)
POTASSIUM: 3.5 mmol/L (ref 3.5–5.1)
SODIUM: 138 mmol/L (ref 135–145)
TOTAL PROTEIN: 5.4 g/dL — AB (ref 6.5–8.1)

## 2017-04-14 LAB — URINALYSIS, ROUTINE W REFLEX MICROSCOPIC
Bilirubin Urine: NEGATIVE
Glucose, UA: NEGATIVE mg/dL
Ketones, ur: NEGATIVE mg/dL
Nitrite: NEGATIVE
PH: 6 (ref 5.0–8.0)
Protein, ur: 30 mg/dL — AB
SPECIFIC GRAVITY, URINE: 1.031 — AB (ref 1.005–1.030)

## 2017-04-14 LAB — I-STAT BETA HCG BLOOD, ED (MC, WL, AP ONLY)

## 2017-04-14 LAB — I-STAT CG4 LACTIC ACID, ED: Lactic Acid, Venous: 1.65 mmol/L (ref 0.5–1.9)

## 2017-04-14 MED ORDER — CEFDINIR 300 MG PO CAPS: 300.0000 mg | ORAL_CAPSULE | Freq: Two times a day (BID) | ORAL | 0 refills | Status: DC

## 2017-04-14 MED ORDER — ALBUTEROL SULFATE (2.5 MG/3ML) 0.083% IN NEBU
INHALATION_SOLUTION | RESPIRATORY_TRACT | Status: AC
  Administered 2017-04-14: 02:00:00
  Filled 2017-04-14: qty 6

## 2017-04-14 MED ORDER — BENZONATATE 100 MG PO CAPS: 100.0000 mg | ORAL_CAPSULE | Freq: Three times a day (TID) | ORAL | 0 refills | Status: DC

## 2017-04-14 MED ORDER — ALBUTEROL SULFATE HFA 108 (90 BASE) MCG/ACT IN AERS: 2.0000 | INHALATION_SPRAY | RESPIRATORY_TRACT | 2 refills | Status: DC | PRN

## 2017-04-14 MED ORDER — PREDNISONE 20 MG PO TABS: ORAL_TABLET | ORAL | 0 refills | Status: DC

## 2017-04-14 MED ORDER — METHYLPREDNISOLONE SODIUM SUCC 125 MG IJ SOLR
125.0000 mg | Freq: Once | INTRAMUSCULAR | Status: AC
  Administered 2017-04-14: 125 mg via INTRAVENOUS
  Filled 2017-04-14: qty 2

## 2017-04-14 MED ORDER — HYDROCODONE-HOMATROPINE 5-1.5 MG/5ML PO SYRP: 5.0000 mL | ORAL_SOLUTION | Freq: Four times a day (QID) | ORAL | 0 refills | Status: DC | PRN

## 2017-04-14 MED ORDER — GUAIFENESIN ER 1200 MG PO TB12: 1.0000 | ORAL_TABLET | Freq: Two times a day (BID) | ORAL | 0 refills | Status: DC

## 2017-04-14 NOTE — ED Notes (Signed)
Pt reports being seen at [facility] a few weeks ago, dx with asthma exacerbation. Pt has had continuing SOB and cough. Also endorses chills. Pt also reports dizziness that started yesterday morning.

## 2017-04-14 NOTE — ED Provider Notes (Signed)
[facility] DEPT Provider Note   CSN: [id number] Arrival date & time: 04/14/17  0207  By signing my name below, I, Ny'Kea Lewis, attest that this documentation has been prepared under the direction and in the presence of Twania Bujak, Gwenyth Allegra, *. Electronically Signed: Lise Auer, ED Scribe. 04/14/17. 2:51 AM.  History   Chief Complaint Chief Complaint  Patient presents with  . Shortness of Breath   The history is provided by the patient. No language interpreter was used.    HPI HPI Comments: Dana Kennedy is a 51 y.o. female with a PMHx of asthma, who presents to the Emergency Department complaining of gradually worsening, persistent shortness of breath. She notes associated cough with phlegm production, congestion, and myalgias. Pt reports since leaving the ED on 7/18, her symptoms have since worsened. She reports having a continuous cough with mild phlegm production. She has tried her inhaler without improvement.   Past Medical History:  Diagnosis Date  . Anemia   . Arthritis   . Asthma   . GERD (gastroesophageal reflux disease)   . Lupus    Patient Active Problem List   Diagnosis Date Noted  . 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 Continuous tobacco abuse 05/02/2016  . 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 Status post tonsillectomy 05/02/2016  . Dehydration 10/12/2013  . 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 Lupus (systemic lupus erythematosus) ([place]) 09/05/2012  . Avascular necrosis of hip ([place]) 06/14/2012   Past Surgical History:  Procedure Laterality Date  . stomach ulcer surgery  2008  . TONSILLECTOMY    . TONSILLECTOMY    . TOTAL HIP ARTHROPLASTY  06/14/2012   Procedure: TOTAL HIP ARTHROPLASTY ANTERIOR APPROACH;  Surgeon: Mcarthur Rossetti, MD;  Location: [facility];  Service: Orthopedics;  Laterality: Left;  Left Total Hip Arthroplasty, Anterior Approach C-Arm   OB History    No data available      Home Medications    Prior to Admission medications   Medication Sig Start Date End Date Taking? Authorizing Provider  cetirizine (ZYRTEC) 10 MG tablet Take 1 tablet (10 mg total) by mouth daily. 11/05/12  Yes Livesay, Lennis P, MD  meloxicam (MOBIC) 7.5 MG tablet Take 7.5-15 mg by mouth daily. 03/17/17  Yes [provider]  oxyCODONE-acetaminophen (PERCOCET/ROXICET) 5-325 MG tablet Take 1 tablet by mouth every 4 (four) hours as needed for severe pain.   Yes [provider]  albuterol (PROVENTIL HFA;VENTOLIN HFA) 108 (90 Base) MCG/ACT inhaler Inhale 2 puffs into the lungs every 4 (four) hours as needed for wheezing or shortness of breath. 04/14/17   Keller Bounds, Gwenyth Allegra, MD  benzonatate (TESSALON) 100 MG capsule Take 1 capsule (100 mg total) by mouth every 8 (eight) hours. 04/14/17   Orpah Greek, MD  cefdinir (OMNICEF) 300 MG capsule Take 1 capsule (300 mg total) by mouth 2 (two) times daily. 04/14/17   Orpah Greek, MD  Guaifenesin 1200 MG TB12 Take 1 tablet (1,200 mg total) by mouth 2 (two) times daily. 04/14/17   Erin Uecker, Gwenyth Allegra, MD  HYDROcodone-homatropine (HYCODAN) 5-1.5 MG/5ML syrup Take 5 mLs by mouth every 6 (six) hours as needed for cough. 04/14/17   Orpah Greek, MD  predniSONE (DELTASONE) 20 MG tablet 3 tabs po daily x 3 days, then 2 tabs x 3 days, then 1.5 tabs x 3 days, then 1 tab x 3 days, then 0.5 tabs x 3 days 04/14/17   Orpah Greek, MD  Family History Family History  Problem Relation Age of Onset  . Cancer Mother   . Hypertension Mother    Social History Social History  Substance Use Topics  . Smoking status: Current Every Day Smoker    Packs/day: 0.33    Years: 29.00    Types: Cigarettes    Last attempt to quit: 10/08/2013  . Smokeless tobacco: Never Used  . Alcohol use Yes     Comment: occasionally    Allergies   Allegra [fexofenadine]; Aspirin; Eszopiclone; and Restoril [temazepam]  Review of  Systems Review of Systems  HENT: Positive for congestion.   Respiratory: Positive for cough and shortness of breath.   Musculoskeletal: Positive for myalgias.  All other systems reviewed and are negative.  Physical Exam Updated Vital Signs BP 128/77   Pulse 99   Temp 99.3 F (37.4 C) (Oral)   Resp (!) 33   Ht 5\' 5"  (1.651 m)   Wt 88.5 kg (195 lb)   LMP 03/22/2017 (Exact Date)   SpO2 97%   BMI 32.45 kg/m   Physical Exam  Constitutional: She is oriented to person, place, and time. She appears well-developed and well-nourished. No distress.  HENT:  Head: Normocephalic and atraumatic.  Right Ear: Hearing normal.  Left Ear: Hearing normal.  Nose: Nose normal.  Mouth/Throat: Oropharynx is clear and moist and mucous membranes are normal.  Eyes: Pupils are equal, round, and reactive to light. Conjunctivae and EOM are normal.  Neck: Normal range of motion. Neck supple.  Cardiovascular: Regular rhythm, S1 normal and S2 normal.  Exam reveals no gallop and no friction rub.   No murmur heard. Pulmonary/Chest: Effort normal. No respiratory distress. She exhibits no tenderness.  Diminished lungs sounds bilaterally. Airway resonates. Slightly increased work of breathing.   Abdominal: Soft. Normal appearance and bowel sounds are normal. There is no hepatosplenomegaly. There is no tenderness. There is no rebound, no guarding, no tenderness at McBurney's point and negative Murphy's sign. No hernia.  Musculoskeletal: Normal range of motion.  Neurological: She is alert and oriented to person, place, and time. She has normal strength. No cranial nerve deficit or sensory deficit. Coordination normal. GCS eye subscore is 4. GCS verbal subscore is 5. GCS motor subscore is 6.  Skin: Skin is warm, dry and intact. No rash noted. No cyanosis.  Psychiatric: She has a normal mood and affect. Her speech is normal and behavior is normal. Thought content normal.  Nursing note and vitals reviewed.   ED  Treatments / Results  DIAGNOSTIC STUDIES: Oxygen Saturation is 99% on RA, normal by my interpretation.   COORDINATION OF CARE: 2:43 AM-Discussed next steps with pt. Pt verbalized understanding and is agreeable with the plan.   Labs (all labs ordered are listed, but only abnormal results are displayed) Labs Reviewed  COMPREHENSIVE METABOLIC PANEL - Abnormal; Notable for the following:       Result Value   Glucose, Bld 133 (*)    Creatinine, Ser 1.12 (*)    Total Protein 5.4 (*)    Albumin 3.1 (*)    ALT 13 (*)    GFR calc non Af Amer 57 (*)    All other components within normal limits  CBC WITH DIFFERENTIAL/PLATELET - Abnormal; Notable for the following:    WBC 12.6 (*)    Hemoglobin 11.8 (*)    HCT 35.7 (*)    Neutro Abs 8.2 (*)    Monocytes Absolute 1.1 (*)    All other components within normal  limits  URINALYSIS, ROUTINE W REFLEX MICROSCOPIC - Abnormal; Notable for the following:    Specific Gravity, Urine 1.031 (*)    Hgb urine dipstick MODERATE (*)    Protein, ur 30 (*)    Leukocytes, UA LARGE (*)    Bacteria, UA RARE (*)    Squamous Epithelial / LPF 0-5 (*)    All other components within normal limits  I-STAT CG4 LACTIC ACID, ED  I-STAT BETA HCG BLOOD, ED ([facility], [facility], [facility] ONLY)  I-STAT CG4 LACTIC ACID, ED    EKG  EKG Interpretation  Date/Time:  Saturday April 14 2017 02:18:21 EDT Ventricular Rate:  97 PR Interval:  174 QRS Duration: 74 QT Interval:  370 QTC Calculation: 469 R Axis:   1 Text Interpretation:  Normal sinus rhythm Possible Left atrial enlargement Borderline ECG No significant change since last tracing Confirmed by Orpah Greek [phone]) on 04/14/2017 2:36:15 AM       Radiology Dg Chest Portable 1 View  Result Date: 04/14/2017 CLINICAL DATA:  Shortness of breath and cough. Dizziness starting yesterday. Seen at [facility] a few weeks ago for asthma exacerbation. EXAM: PORTABLE CHEST 1 VIEW COMPARISON:  04/04/2017 FINDINGS: Heart size and  pulmonary vascularity are normal for technique. Central interstitial changes and peribronchial thickening are consistent with history of asthma. Chronic bronchitis could also have this appearance. No airspace disease or consolidation. No blunting of costophrenic angles. No pneumothorax. Tortuous aorta. IMPRESSION: Central interstitial pattern to the lungs likely representing changes due to asthma or chronic bronchitis. No focal consolidation. Electronically Signed   By: Lucienne Capers M.D.   On: 04/14/2017 02:55    Procedures Procedures (including critical care time)  Medications Ordered in ED Medications  albuterol (PROVENTIL) (2.5 MG/3ML) 0.083% nebulizer solution (  Given 04/14/17 0226)  methylPREDNISolone sodium succinate (SOLU-MEDROL) 125 mg/2 mL injection 125 mg (125 mg Intravenous Given 04/14/17 0325)     Initial Impression / Assessment and Plan / ED Course  I have reviewed the triage vital signs and the nursing notes.  Pertinent labs & imaging results that were available during my care of the patient were reviewed by me and considered in my medical decision making (see chart for details).     Patient with history of asthma presents to the emergency department with complaints of shortness of breath. Patient was recently seen at [facility] long with similar complaints. She was treated with albuterol and prednisone. Patient reports that her cough has worsened since then. He has been persistent, recurrent and now productive of thick yellow and green sputum. She had significant congestion and upper airway residence but only minimal wheezing at arrival. She was treated with continuous nebulizer, Solu-Medrol. Chest x-ray does not show evidence of pneumonia. Blood work is unremarkable. Urinalysis does suggest infection. Will treat with omnicef (cover urine and lung). Restart prednisone taper. Hycodan, tessalon.   Final Clinical Impressions(s) / ED Diagnoses   Final diagnoses:  Moderate  persistent asthma with acute exacerbation  Acute cystitis without hematuria    New Prescriptions New Prescriptions   ALBUTEROL (PROVENTIL HFA;VENTOLIN HFA) 108 (90 BASE) MCG/ACT INHALER    Inhale 2 puffs into the lungs every 4 (four) hours as needed for wheezing or shortness of breath.   BENZONATATE (TESSALON) 100 MG CAPSULE    Take 1 capsule (100 mg total) by mouth every 8 (eight) hours.   CEFDINIR (OMNICEF) 300 MG CAPSULE    Take 1 capsule (300 mg total) by mouth 2 (two) times daily.  GUAIFENESIN 1200 MG TB12    Take 1 tablet (1,200 mg total) by mouth 2 (two) times daily.   HYDROCODONE-HOMATROPINE (HYCODAN) 5-1.5 MG/5ML SYRUP    Take 5 mLs by mouth every 6 (six) hours as needed for cough.   PREDNISONE (DELTASONE) 20 MG TABLET    3 tabs po daily x 3 days, then 2 tabs x 3 days, then 1.5 tabs x 3 days, then 1 tab x 3 days, then 0.5 tabs x 3 days  I personally performed the services described in this documentation, which was scribed in my presence. The recorded information has been reviewed and is accurate.     Orpah Greek, MD 04/14/17 [phone]

## 2017-04-14 NOTE — ED Notes (Signed)
Breathing tx given in triage

## 2017-06-25 ENCOUNTER — Ambulatory Visit: Payer: Medicaid Other | Admitting: Allergy and Immunology

## 2017-07-31 ENCOUNTER — Ambulatory Visit: Payer: Self-pay | Admitting: Obstetrics

## 2017-08-13 ENCOUNTER — Ambulatory Visit: Payer: Medicaid Other | Admitting: Allergy and Immunology

## 2017-09-19 ENCOUNTER — Encounter (HOSPITAL_COMMUNITY): Payer: Self-pay

## 2017-09-19 ENCOUNTER — Emergency Department (HOSPITAL_COMMUNITY)
Admission: EM | Admit: 2017-09-19 | Discharge: 2017-09-19 | Disposition: A | Discharge status: Home / Self Care | Payer: Medicaid Other | Attending: Emergency Medicine | Admitting: Emergency Medicine

## 2017-09-19 ENCOUNTER — Other Ambulatory Visit: Payer: Self-pay

## 2017-09-19 DIAGNOSIS — J45909 Unspecified asthma, uncomplicated: Secondary | ICD-10-CM | POA: Diagnosis not present

## 2017-09-19 DIAGNOSIS — M545 Low back pain, unspecified: Secondary | ICD-10-CM

## 2017-09-19 DIAGNOSIS — Z79899 Other long term (current) drug therapy: Secondary | ICD-10-CM | POA: Diagnosis not present

## 2017-09-19 DIAGNOSIS — Z87891 Personal history of nicotine dependence: Secondary | ICD-10-CM | POA: Diagnosis not present

## 2017-09-19 DIAGNOSIS — Z96642 Presence of left artificial hip joint: Secondary | ICD-10-CM | POA: Diagnosis not present

## 2017-09-19 MED ORDER — METHOCARBAMOL 500 MG PO TABS: 500.0000 mg | ORAL_TABLET | Freq: Three times a day (TID) | ORAL | 0 refills | Status: DC | PRN

## 2017-09-19 MED ORDER — OXYCODONE-ACETAMINOPHEN 5-325 MG PO TABS
1.0000 | ORAL_TABLET | ORAL | Status: AC | PRN
Start: 2017-09-19 — End: 2017-09-19
  Administered 2017-09-19 (×2): 1 via ORAL
  Filled 2017-09-19 (×2): qty 1

## 2017-09-19 MED ORDER — METHOCARBAMOL 500 MG PO TABS
500.0000 mg | ORAL_TABLET | Freq: Once | ORAL | Status: AC
Start: 2017-09-19 — End: 2017-09-19
  Administered 2017-09-19: 500 mg via ORAL
  Filled 2017-09-19: qty 1

## 2017-09-19 NOTE — Discharge Instructions (Signed)
Back Pain:  I have prescribed you a muscle relaxer.   Robaxin is the muscle relaxer I have prescribed, this is meant to help with muscle tightness. Be aware that this medication may make you drowsy therefore the first time you take this it should be at a time you are in an environment where you can rest. Do not drive or operate heavy machinery when taking this medication.   In addition you may also take Tylenol. Tylenol is generally safe, though you should not take more than 8 of the extra strength (500mg ) pills a day. I also recommend you ask the pharmacist about Lidoderm patches- these are over the counter patches that you can apply to your areas of pain.   The application of heat can help soothe the pain.  Maintaining your daily activities, including walking, is encourged, as it will help you get better faster than just staying in bed.  Low back pain is discomfort in the lower back that may be due to injuries to muscles and ligaments around the spine.    Your pain should get better over the next 2 weeks.  You will need to follow up with  Your primary healthcare provider in 1-2 weeks for reassessment, if you do not have a primary care provider one is provided in your discharge instructions. However if you develop severe or worsening pain, low back pain with fever, numbness, weakness, loss of bowel or bladder control, or inability to walk or urinate, you should return to the ER immediately.  Please follow up with your doctor this week for a recheck if still having symptoms.  Additionally your blood pressure was elevated in the emergency department at 152/104. This will need to be rechecked by your primary care provider in the next 1 week.

## 2017-09-19 NOTE — ED Provider Notes (Signed)
[facility] EMERGENCY DEPARTMENT Provider Note   CSN: [id number] Arrival date & time: 09/19/17  1332     History   Chief Complaint Chief Complaint  Patient presents with  . Back Pain    HPI Dana Kennedy is a 52 y.o. female with a hx of anemia, asthma, GERD, SLE, and former tobacco abuse presents to the ED complaining right lower back pain that she first noted this morning upon waking up and then acutely worsened when raising her R hand in class today.  Patient states that she had a sharp pain in her right lower back when she raised her hand.  At present describes the pain in the right lower back as being achy and a 10 out of 10 in severity.  Pain is worse with movement. Tried percocet and oxycodone without change. Denies numbness, tingling, weakness, incontinence to bowel/bladder, fever, chills, IV drug use, or hx of cancer.   HPI  Past Medical History:  Diagnosis Date  . Anemia   . Arthritis   . Asthma   . GERD (gastroesophageal reflux disease)   . Lupus     Patient Active Problem List   Diagnosis Date Noted  . 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 Continuous tobacco abuse 05/02/2016  . 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 Status post tonsillectomy 05/02/2016  . Dehydration 10/12/2013  . 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 Lupus (systemic lupus erythematosus) ([place]) 09/05/2012  . Avascular necrosis of hip ([place]) 06/14/2012    Past Surgical History:  Procedure Laterality Date  . stomach ulcer surgery  2008  . TONSILLECTOMY    . TONSILLECTOMY    . TOTAL HIP ARTHROPLASTY  06/14/2012   Procedure: TOTAL HIP ARTHROPLASTY ANTERIOR APPROACH;  Surgeon: Mcarthur Rossetti, MD;  Location: [facility];  Service: Orthopedics;  Laterality: Left;  Left Total Hip Arthroplasty, Anterior Approach C-Arm    OB History    No data available       Home Medications    Prior to Admission medications   Medication  Sig Start Date End Date Taking? Authorizing Provider  albuterol (PROVENTIL HFA;VENTOLIN HFA) 108 (90 Base) MCG/ACT inhaler Inhale 2 puffs into the lungs every 4 (four) hours as needed for wheezing or shortness of breath. 04/14/17   Pollina, Gwenyth Allegra, MD  benzonatate (TESSALON) 100 MG capsule Take 1 capsule (100 mg total) by mouth every 8 (eight) hours. 04/14/17   Orpah Greek, MD  cefdinir (OMNICEF) 300 MG capsule Take 1 capsule (300 mg total) by mouth 2 (two) times daily. 04/14/17   Orpah Greek, MD  cetirizine (ZYRTEC) 10 MG tablet Take 1 tablet (10 mg total) by mouth daily. 11/05/12   Livesay, Tamala Julian, MD  Guaifenesin 1200 MG TB12 Take 1 tablet (1,200 mg total) by mouth 2 (two) times daily. 04/14/17   Pollina, Gwenyth Allegra, MD  HYDROcodone-homatropine (HYCODAN) 5-1.5 MG/5ML syrup Take 5 mLs by mouth every 6 (six) hours as needed for cough. 04/14/17   Orpah Greek, MD  meloxicam (MOBIC) 7.5 MG tablet Take 7.5-15 mg by mouth daily. 03/17/17   [provider]  oxyCODONE-acetaminophen (PERCOCET/ROXICET) 5-325 MG tablet Take 1 tablet by mouth every 4 (four) hours as needed for severe pain.    [provider]  predniSONE (DELTASONE) 20 MG tablet 3 tabs po daily x 3 days, then 2 tabs x 3 days, then 1.5 tabs x 3 days, then 1 tab x 3 days, then 0.5  tabs x 3 days 04/14/17   Orpah Greek, MD    Family History Family History  Problem Relation Age of Onset  . Cancer Mother   . Hypertension Mother     Social History Social History   Tobacco Use  . Smoking status: Former Smoker    Packs/day: 0.33    Years: 29.00    Pack years: 9.57    Types: Cigarettes    Last attempt to quit: 10/08/2013    Years since quitting: 3.9  . Smokeless tobacco: Never Used  Substance Use Topics  . Alcohol use: Yes    Comment: occasionally  . Drug use: No     Allergies   Allegra [fexofenadine]; Aspirin; Eszopiclone; and Restoril [temazepam]   Review of  Systems Review of Systems  Constitutional: Negative for chills and fever.  Gastrointestinal: Negative for abdominal pain.  Genitourinary: Negative for dysuria.  Musculoskeletal: Positive for back pain.  Neurological: Negative for weakness and numbness.       Negative for incontinence.     Physical Exam Updated Vital Signs BP (!) 152/104 (BP Location: Right Arm)   Pulse 94   Temp 98.1 F (36.7 C) (Oral)   Resp 20   Ht 5\' 5"  (1.651 m)   Wt 96.2 kg (212 lb)   LMP 07/23/2017 (Within Weeks)   SpO2 99%   BMI 35.28 kg/m   Physical Exam  Constitutional: She appears well-developed and well-nourished. No distress.  HENT:  Head: Normocephalic and atraumatic.  Eyes: Conjunctivae are normal. Right eye exhibits no discharge. Left eye exhibits no discharge.  Neck: No spinous process tenderness present.  Cardiovascular: Normal rate and regular rhythm.  No murmur heard. Pulses:      Popliteal pulses are 2+ on the right side, and 2+ on the left side.  Musculoskeletal:  Back: No obvious deformity, erythema, or warmth. Patient has diffuse tenderness to the lower back- most prominently she has R lumbar paraspinal muscle tenderness. She is very mildly tender to the diffuse lumbar spine and L lumbar paraspinal muscles, no focal vertebral tenderness.   Neurological: She is alert.  Clear speech.  5 out of 5 strength with dorsi and plantar flexion bilaterally.  Sensation grossly intact to bilateral lower extremities.  Patient is able to ambulate, no foot drop.   Psychiatric: She has a normal mood and affect. Her behavior is normal. Thought content normal.  Nursing note and vitals reviewed.   ED Treatments / Results  Labs (all labs ordered are listed, but only abnormal results are displayed) Labs Reviewed - No data to display  EKG  EKG Interpretation None      Radiology No results found.  Procedures Procedures (including critical care time)  Medications Ordered in ED Medications    methocarbamol (ROBAXIN) tablet 500 mg (not administered)  oxyCODONE-acetaminophen (PERCOCET/ROXICET) 5-325 MG per tablet 1 tablet (1 tablet Oral Given 09/19/17 1517)    Initial Impression / Assessment and Plan / ED Course  I have reviewed the triage vital signs and the nursing notes.  Pertinent labs & imaging results that were available during my care of the patient were reviewed by me and considered in my medical decision making (see chart for details).    Patient present with back pain. She is nontoxic appearing, vitals WNL other than BP which is elevated at 152/104. Patient with diffuse lower back tenderness, most prominent to R lumbar paraspinal muscles, no point vertebral tenderness.  No neurological deficits and normal neuro exam.  Patient can walk  but states is painful.  No loss of bowel or bladder control.  No concern for cauda equina.  No fever, night sweats, weight loss, h/o cancer, IVDU.  Will treat with Robaxin, recommended lidoderm patches and heat. Recommended continuing of at home pain medication regimen. Will avoid NSAIDs in setting of SLE, additionally patient's rheumatologist has instructed her not to take these medications. Patient's blood pressure elevated in the emergency department today. Patient denies headache, change in vision, numbness, weakness, chest pain, dyspnea, dizziness, or lightheadedness therefore doubt hypertensive emergency. Discussed elevated blood pressure with the patient and the need for primary care follow up for re-check. I discussed treatment plan, need for PCP follow-up, and return precautions with the patient. Provided opportunity for questions, patient confirmed understanding and is in agreement with plan.   Final Clinical Impressions(s) / ED Diagnoses   Final diagnoses:  Acute right-sided low back pain without sciatica    ED Discharge Orders        Ordered    methocarbamol (ROBAXIN) 500 MG tablet  Every 8 hours PRN     09/19/17 1743        Jamisha Hoeschen, Glynda Jaeger, PA-C 09/19/17 1859    Drenda Freeze, MD 09/22/17 [phone]

## 2017-09-19 NOTE — ED Triage Notes (Signed)
Per Pt, Pt is coming from class where she reports that she went to raise her hand and she noted a shooting pain in her lower back. "I felt like I was getting electrocuted." Denies bowel or bladder changes. Reports pain with ambulation.

## 2017-09-19 NOTE — ED Notes (Signed)
Patient asked for sandwich and was told that she needed to be seen by the MD first.

## 2017-10-31 ENCOUNTER — Encounter (HOSPITAL_COMMUNITY): Payer: Self-pay | Admitting: Emergency Medicine

## 2017-10-31 ENCOUNTER — Emergency Department (HOSPITAL_COMMUNITY): Payer: Medicaid Other

## 2017-10-31 ENCOUNTER — Emergency Department (HOSPITAL_COMMUNITY)
Admission: EM | Admit: 2017-10-31 | Discharge: 2017-11-01 | Disposition: A | Discharge status: Home / Self Care | Payer: Medicaid Other | Attending: Emergency Medicine | Admitting: Emergency Medicine

## 2017-10-31 ENCOUNTER — Other Ambulatory Visit: Payer: Self-pay

## 2017-10-31 DIAGNOSIS — R0602 Shortness of breath: Secondary | ICD-10-CM | POA: Diagnosis not present

## 2017-10-31 DIAGNOSIS — J45909 Unspecified asthma, uncomplicated: Secondary | ICD-10-CM | POA: Diagnosis not present

## 2017-10-31 DIAGNOSIS — Z87891 Personal history of nicotine dependence: Secondary | ICD-10-CM | POA: Diagnosis not present

## 2017-10-31 DIAGNOSIS — R079 Chest pain, unspecified: Secondary | ICD-10-CM | POA: Insufficient documentation

## 2017-10-31 DIAGNOSIS — Z96642 Presence of left artificial hip joint: Secondary | ICD-10-CM | POA: Diagnosis not present

## 2017-10-31 DIAGNOSIS — Z79899 Other long term (current) drug therapy: Secondary | ICD-10-CM | POA: Diagnosis not present

## 2017-10-31 LAB — BASIC METABOLIC PANEL
ANION GAP: 11 (ref 5–15)
BUN: 6 mg/dL (ref 6–20)
CALCIUM: 9.4 mg/dL (ref 8.9–10.3)
CO2: 24 mmol/L (ref 22–32)
Chloride: 106 mmol/L (ref 101–111)
Creatinine, Ser: 0.71 mg/dL (ref 0.44–1.00)
GFR calc non Af Amer: >60 mL/min (ref >60)
Glucose, Bld: 203 mg/dL — ABNORMAL HIGH (ref 65–99)
Potassium: 4.1 mmol/L (ref 3.5–5.1)
Sodium: 141 mmol/L (ref 135–145)

## 2017-10-31 LAB — CBC WITH DIFFERENTIAL/PLATELET
BASOS ABS: 0 10*3/uL (ref 0.0–0.1)
BASOS PCT: 0 %
Eosinophils Absolute: 0.3 10*3/uL (ref 0.0–0.7)
Eosinophils Relative: 4 %
HEMATOCRIT: 37.6 % (ref 36.0–46.0)
HEMOGLOBIN: 12.1 g/dL (ref 12.0–15.0)
Lymphocytes Relative: 26 %
Lymphs Abs: 2.1 10*3/uL (ref 0.7–4.0)
MCH: 26.9 pg (ref 26.0–34.0)
MCHC: 32.2 g/dL (ref 30.0–36.0)
MCV: 83.7 fL (ref 78.0–100.0)
MONOS PCT: 6 %
Monocytes Absolute: 0.5 10*3/uL (ref 0.1–1.0)
NEUTROS PCT: 64 %
Neutro Abs: 5.2 10*3/uL (ref 1.7–7.7)
Platelets: 439 10*3/uL — ABNORMAL HIGH (ref 150–400)
RBC: 4.49 MIL/uL (ref 3.87–5.11)
RDW: 15.4 % (ref 11.5–15.5)
WBC: 8.2 10*3/uL (ref 4.0–10.5)

## 2017-10-31 LAB — I-STAT TROPONIN, ED: Troponin i, poc: 0 ng/mL (ref 0.00–0.08)

## 2017-10-31 MED ORDER — IOPAMIDOL (ISOVUE-370) INJECTION 76%
INTRAVENOUS | Status: AC
  Administered 2017-11-01: 100 mL
  Filled 2017-10-31: qty 100

## 2017-10-31 MED ORDER — PREDNISONE 20 MG PO TABS: 60.0000 mg | ORAL_TABLET | Freq: Every day | ORAL | 0 refills | Status: DC

## 2017-10-31 MED ORDER — PREDNISONE 20 MG PO TABS
60.0000 mg | ORAL_TABLET | Freq: Once | ORAL | Status: AC
  Administered 2017-10-31: 60 mg via ORAL
  Filled 2017-10-31: qty 3

## 2017-10-31 MED ORDER — IPRATROPIUM-ALBUTEROL 0.5-2.5 (3) MG/3ML IN SOLN
3.0000 mL | Freq: Once | RESPIRATORY_TRACT | Status: AC
  Administered 2017-10-31: 3 mL via RESPIRATORY_TRACT
  Filled 2017-10-31: qty 3

## 2017-10-31 NOTE — ED Notes (Signed)
Pt taken to CT.

## 2017-10-31 NOTE — ED Triage Notes (Signed)
Pt presents with central and L sided CP and SOB since 3 days ago also sore throat and nausea; pt states she was not exerting herself when it started; hx lupus

## 2017-10-31 NOTE — ED Provider Notes (Signed)
[facility] EMERGENCY DEPARTMENT Provider Note   CSN: [id number] Arrival date & time: 10/31/17  1425     History   Chief Complaint Chief Complaint  Patient presents with  . Chest Pain  . Shortness of Breath    HPI Dana Kennedy is a 52 y.o. female.  The history is provided by the patient.  Chest Pain   This is a new problem. The current episode started 12 to 24 hours ago. The problem has not changed since onset.The pain is associated with breathing. The pain is present in the substernal region. The pain is mild. The quality of the pain is described as pressure-like. The pain does not radiate. Associated symptoms include cough, hemoptysis, nausea, shortness of breath and vomiting. Pertinent negatives include no abdominal pain, no back pain, no diaphoresis, no exertional chest pressure, no fever and no palpitations. She has tried nothing for the symptoms.  Pertinent negatives for past medical history include no seizures.  Her family medical history is significant for CAD.  Shortness of Breath  Associated symptoms include cough, hemoptysis, chest pain and vomiting. Pertinent negatives include no fever, no sore throat, no ear pain, no abdominal pain and no rash.    52 year old female complaining of cough that is been gone for a long time but acutely worsened over the last few days associated with increased shortness of breath and some chest discomfort.  There is also a sore throat.  Patient denies any fevers.  She states she is never had any cardiac problems before.  She has had a chronic cough that is been going on for she feels about 2 years.  Currently it is productive of some green sputum.  She does have a history of lupus which she attributes to easy bleeding and bruising.  She did complain of some minor hemoptysis with streaks of blood in her sputum that she states is normal for her.  Past Medical History:  Diagnosis Date  . Anemia   . Arthritis   . Asthma   .  GERD (gastroesophageal reflux disease)   . Lupus     Patient Active Problem List   Diagnosis Date Noted  . 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 Continuous tobacco abuse 05/02/2016  . 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 Status post tonsillectomy 05/02/2016  . Dehydration 10/12/2013  . 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 Lupus (systemic lupus erythematosus) ([place]) 09/05/2012  . Avascular necrosis of hip ([place]) 06/14/2012    Past Surgical History:  Procedure Laterality Date  . stomach ulcer surgery  2008  . TONSILLECTOMY    . TONSILLECTOMY    . TOTAL HIP ARTHROPLASTY  06/14/2012   Procedure: TOTAL HIP ARTHROPLASTY ANTERIOR APPROACH;  Surgeon: Mcarthur Rossetti, MD;  Location: [facility] ORS;  Service: Orthopedics;  Laterality: Left;  Left Total Hip Arthroplasty, Anterior Approach C-Arm    OB History    No data available       Home Medications    Prior to Admission medications   Medication Sig Start Date End Date Taking? Authorizing Provider  albuterol (PROVENTIL HFA;VENTOLIN HFA) 108 (90 Base) MCG/ACT inhaler Inhale 2 puffs into the lungs every 4 (four) hours as needed for wheezing or shortness of breath. 04/14/17   Pollina, Gwenyth Allegra, MD  benzonatate (TESSALON) 100 MG capsule Take 1 capsule (100 mg total) by mouth every 8 (eight) hours. 04/14/17   Orpah Greek, MD  cefdinir (OMNICEF) 300 MG capsule Take 1  capsule (300 mg total) by mouth 2 (two) times daily. 04/14/17   Orpah Greek, MD  cetirizine (ZYRTEC) 10 MG tablet Take 1 tablet (10 mg total) by mouth daily. 11/05/12   Livesay, Tamala Julian, MD  Guaifenesin 1200 MG TB12 Take 1 tablet (1,200 mg total) by mouth 2 (two) times daily. 04/14/17   Pollina, Gwenyth Allegra, MD  HYDROcodone-homatropine (HYCODAN) 5-1.5 MG/5ML syrup Take 5 mLs by mouth every 6 (six) hours as needed for cough. 04/14/17   Orpah Greek, MD  meloxicam (MOBIC) 7.5 MG tablet Take 7.5-15  mg by mouth daily. 03/17/17   [provider]  methocarbamol (ROBAXIN) 500 MG tablet Take 1 tablet (500 mg total) by mouth every 8 (eight) hours as needed for muscle spasms. 09/19/17   Petrucelli, Glynda Jaeger, PA-C  oxyCODONE-acetaminophen (PERCOCET/ROXICET) 5-325 MG tablet Take 1 tablet by mouth every 4 (four) hours as needed for severe pain.    [provider]  predniSONE (DELTASONE) 20 MG tablet 3 tabs po daily x 3 days, then 2 tabs x 3 days, then 1.5 tabs x 3 days, then 1 tab x 3 days, then 0.5 tabs x 3 days 04/14/17   Orpah Greek, MD    Family History Family History  Problem Relation Age of Onset  . Cancer Mother   . Hypertension Mother     Social History Social History   Tobacco Use  . Smoking status: Former Smoker    Packs/day: 0.33    Years: 29.00    Pack years: 9.57    Types: Cigarettes    Last attempt to quit: 10/08/2013    Years since quitting: 4.0  . Smokeless tobacco: Never Used  Substance Use Topics  . Alcohol use: Yes    Comment: occasionally  . Drug use: No     Allergies   Allegra [fexofenadine]; Aspirin; Eszopiclone; and Restoril [temazepam]   Review of Systems Review of Systems  Constitutional: Negative for chills, diaphoresis and fever.  HENT: Negative for ear pain and sore throat.   Eyes: Negative for pain and visual disturbance.  Respiratory: Positive for cough, hemoptysis and shortness of breath.   Cardiovascular: Positive for chest pain. Negative for palpitations.  Gastrointestinal: Positive for nausea and vomiting. Negative for abdominal pain.  Genitourinary: Positive for vaginal bleeding (having period). Negative for dysuria and hematuria.  Musculoskeletal: Negative for arthralgias and back pain.  Skin: Negative for color change and rash.  Neurological: Negative for seizures and syncope.  All other systems reviewed and are negative.    Physical Exam Updated Vital Signs BP 121/85   Pulse 91   Temp 98.4 F (36.9 C)  (Oral)   Resp 20   Ht 5\' 5"  (1.651 m)   Wt 96.2 kg (212 lb)   LMP 10/24/2017   SpO2 99%   BMI 35.28 kg/m   Physical Exam  Constitutional: She appears well-developed and well-nourished. No distress.  HENT:  Head: Normocephalic and atraumatic.  Nose: Nose normal.  Mouth/Throat: Oropharynx is clear and moist. No oropharyngeal exudate.  Eyes: Conjunctivae and EOM are normal. Pupils are equal, round, and reactive to light. Right eye exhibits no discharge. Left eye exhibits no discharge.  Neck: Neck supple.  Cardiovascular: Normal rate, regular rhythm and normal pulses.  No murmur heard. Pulmonary/Chest: Effort normal. No respiratory distress. She has rhonchi in the right upper field, the right middle field, the right lower field, the left upper field, the left middle field and the left lower field.  Abdominal: Soft.  There is no tenderness.  Musculoskeletal: Normal range of motion. She exhibits no edema.       Right lower leg: Normal. She exhibits no tenderness and no edema.       Left lower leg: Normal. She exhibits no tenderness and no edema.  Lymphadenopathy:    She has no cervical adenopathy.  Neurological: She is alert.  Skin: Skin is warm and dry. Capillary refill takes less than 2 seconds.  Psychiatric: She has a normal mood and affect.  Nursing note and vitals reviewed.    ED Treatments / Results  Labs (all labs ordered are listed, but only abnormal results are displayed) Labs Reviewed  BASIC METABOLIC PANEL - Abnormal; Notable for the following components:      Result Value   Glucose, Bld 203 (*)    All other components within normal limits  CBC WITH DIFFERENTIAL/PLATELET - Abnormal; Notable for the following components:   Platelets 439 (*)    All other components within normal limits  I-STAT TROPONIN, ED    EKG  EKG Interpretation  Date/Time:  Wednesday October 31 2017 19:13:14 EST Ventricular Rate:  91 PR Interval:    QRS Duration: 80 QT Interval:  370 QTC  Calculation: 456 R Axis:   8 Text Interpretation:  Sinus rhythm Abnormal R-wave progression, early transition Abnormal inferior Q waves similar to prior Confirmed by Aletta Edouard [phone]) on 10/31/2017 7:17:50 PM       Radiology Dg Chest 2 View  Result Date: 10/31/2017 CLINICAL DATA:  Three days of left-sided chest pain, dizziness. Three weeks of cough with onset of hemoptysis over the past 3 days. History of asthma, lupus, formersmoker. EXAM: CHEST  2 VIEW COMPARISON:  Portable chest x-ray of April 14, 2017 FINDINGS: The lungs are adequately inflated. There is a subtle nodule which projects in the right upper lobe measuring approximately 2 x 3 cm. Elsewhere the right lung is clear as is the left lung. The interstitial markings are coarse though stable. The heart and pulmonary vascularity are normal. The mediastinum is normal in width. There is mild tortuosity of the descending thoracic aorta. The bony thorax exhibits no acute abnormality. IMPRESSION: Subtle abnormal nodule in the right upper lobe. Chest CT scanning is recommended for further evaluation and to exclude malignancy. Chronic bronchitic-smoking related changes.  No acute pneumonia. Electronically Signed   By: David  Martinique M.D.   On: 10/31/2017 16:41   Ct Angio Chest Pe W/cm &/or Wo Cm  Result Date: 11/01/2017 CLINICAL DATA:  Shortness of breath.  Chest pain for 3 days. EXAM: CT ANGIOGRAPHY CHEST WITH CONTRAST TECHNIQUE: Multidetector CT imaging of the chest was performed using the standard protocol during bolus administration of intravenous contrast. Multiplanar CT image reconstructions and MIPs were obtained to evaluate the vascular anatomy. CONTRAST:  63 cc ISOVUE-370 IOPAMIDOL (ISOVUE-370) INJECTION 76% COMPARISON:  Radiograph earlier this day.  Chest CT 09/05/2010 FINDINGS: Cardiovascular: There are no filling defects within the pulmonary arteries to suggest pulmonary embolus. Mild cardiomegaly with tortuous thoracic aorta. No aortic  dissection. Small amount of fluid in the superior pericardial recess without pericardial effusion. Mediastinum/Nodes: Calcified subcarinal lymph nodes. No noncalcified adenopathy. Visualized thyroid gland is normal. Mid esophagus is patulous. Small hiatal hernia. Lungs/Pleura: No right upper lobe pulmonary nodule corresponding to that questioned on radiograph, radiographic finding represents an EKG lead. Lower lobe bronchial thickening with mild geographic ground-glass opacity/mosaic attenuation in the lower lobes. Calcified granuloma in the left lower lobe. No suspicious pulmonary nodule or mass.  No pleural fluid. Upper Abdomen: No acute abnormality. Musculoskeletal: There are no acute or suspicious osseous abnormalities. Review of the MIP images confirms the above findings. IMPRESSION: 1. No pulmonary embolus. 2. Lower lobe bronchial thickening with scattered ground-glass opacities/mosaic attenuation, lower lung zone predominant. Findings may be infectious or inflammatory bronchial inflammation and pneumonitis. 3. Sequela of prior granulomatous disease with calcified left lung nodule and mediastinal nodes. No suspicious nodule or mass. Electronically Signed   By: Jeb Levering M.D.   On: 11/01/2017 00:32    Procedures Procedures (including critical care time)  Medications Ordered in ED Medications  ipratropium-albuterol (DUONEB) 0.5-2.5 (3) MG/3ML nebulizer solution 3 mL (3 mLs Nebulization Given 10/31/17 2007)  predniSONE (DELTASONE) tablet 60 mg (60 mg Oral Given 10/31/17 2115)  iopamidol (ISOVUE-370) 76 % injection (100 mLs  Contrast Given 11/01/17 0001)  azithromycin (ZITHROMAX) tablet 500 mg (500 mg Oral Given 11/01/17 0104)     Initial Impression / Assessment and Plan / ED Course  I have reviewed the triage vital signs and the nursing notes.  Pertinent labs & imaging results that were available during my care of the patient were reviewed by me and considered in my medical decision making  (see chart for details).  Clinical Course as of Nov 01 1045  Wed Nov 01, 3143  819 52 year old female with lupus who is here with chest pain shortness of breath in the setting of a cough.  She states there is been some minor hemoptysis.  On lung exam she is got some diffuse rhonchi.  She is otherwise nontoxic appearing and in no distress.  Her sats have been good here.  Her labs are unremarkable and her chest x-ray is negative.  Her troponin is also negative.  She is getting a breathing treatment and will reevaluate for possible discharge.  [MB]    Clinical Course User Index [MB] Hayden Rasmussen, MD   Patient with history of lupus and chronic cough here with increased cough shortness of breath.  Her initial workup has been unrevealing other than a nodule on her chest x-ray that radiology is recommending a CT.  Due to her history of lupus and this shortness of breath cough/chest pain I reviewed a chest CT which showed no evidence of PE and a granuloma to account for the nodule.  They also commented upon some lower lobe groundglass areas in the lung that could be infectious versus inflammatory.  Covering the patient with antibiotics and prednisone and she will follow-up with her primary care doctor.  She understands to  return if any worsening symptoms.  Final Clinical Impressions(s) / ED Diagnoses   Final diagnoses:  Chest pain, unspecified type  Shortness of breath    ED Discharge Orders        Ordered    azithromycin (ZITHROMAX) 250 MG tablet  Daily     11/01/17 0048    predniSONE (DELTASONE) 20 MG tablet  Daily     10/31/17 2338       Hayden Rasmussen, MD 11/01/17 1049

## 2017-10-31 NOTE — ED Provider Notes (Signed)
  Patient placed in Quick Look pathway, seen and evaluated for chief complaint of chest pain, shortness of breath and nausea for three days. She reports history of asthma. She reports left sided chest pain. She also reports sore throat.   Pertinent H&P findings include Speech is clear and coherent, non-toxic appearing, no respiratory distress.   Throat is clear. Bilateral rhonchi to bases.  Based on initial evaluation, labs are indicated and radiology studies are indicated.  Patient counseled on process, plan, and necessity for staying for completing the evaluation.   Vitals:   10/31/17 1600  BP: (!) 142/89  Pulse: 96  Resp: 20  Temp: 98.4 F (36.9 C)  TempSrc: Oral  SpO2: 100%  Weight: 96.2 kg (212 lb)  Height: 5\' 5"  (1.651 m)      Waynetta Pean, PA-C 10/31/17 1603    Hayden Rasmussen, MD 11/01/17 1134

## 2017-11-01 ENCOUNTER — Encounter (HOSPITAL_COMMUNITY): Payer: Self-pay | Admitting: Radiology

## 2017-11-01 ENCOUNTER — Emergency Department (HOSPITAL_COMMUNITY): Payer: Medicaid Other

## 2017-11-01 MED ORDER — AZITHROMYCIN 250 MG PO TABS: 250.0000 mg | ORAL_TABLET | Freq: Every day | ORAL | 0 refills | Status: DC

## 2017-11-01 MED ORDER — AZITHROMYCIN 250 MG PO TABS
500.0000 mg | ORAL_TABLET | Freq: Once | ORAL | Status: AC
Start: 2017-11-01 — End: 2017-11-01
  Administered 2017-11-01: 500 mg via ORAL
  Filled 2017-11-01: qty 2

## 2017-11-01 NOTE — ED Notes (Signed)
PT states understanding of care given, follow up care, and medication prescribed. PT ambulated from ED to car with a steady gait. 

## 2017-11-01 NOTE — Discharge Instructions (Signed)
You were evaluated in the emergency department for chest pain and shortness of breath.  Your workup did not show any signs of any heart injury.  Your chest x-ray showed a nodule and we did a CT to evaluate this.  There was no sign of blood clot but there was some inflammatory changes.  We are treating you with prednisone and an antibiotic.  He should return if any worsening symptoms.

## 2017-11-02 ENCOUNTER — Telehealth: Payer: Self-pay

## 2017-11-02 NOTE — Telephone Encounter (Signed)
Spoke with patient and scheduled appointment for next week. Per 2/15 in basket

## 2017-11-05 ENCOUNTER — Inpatient Hospital Stay: Payer: Medicaid Other | Attending: Hematology and Oncology | Admitting: Hematology and Oncology

## 2017-11-05 ENCOUNTER — Encounter: Payer: Self-pay | Admitting: Hematology and Oncology

## 2017-11-05 ENCOUNTER — Telehealth: Payer: Self-pay | Admitting: Hematology and Oncology

## 2017-11-05 ENCOUNTER — Inpatient Hospital Stay: Payer: Medicaid Other

## 2017-11-05 ENCOUNTER — Other Ambulatory Visit: Payer: Self-pay

## 2017-11-05 VITALS — BP 151/92 | HR 78 | Temp 97.9°F | Resp 18 | Ht 65.0 in | Wt 206.2 lb

## 2017-11-05 DIAGNOSIS — I10 Essential (primary) hypertension: Secondary | ICD-10-CM | POA: Diagnosis not present

## 2017-11-05 DIAGNOSIS — D6941 Evans syndrome: Secondary | ICD-10-CM | POA: Diagnosis not present

## 2017-11-05 DIAGNOSIS — D693 Immune thrombocytopenic purpura: Secondary | ICD-10-CM

## 2017-11-05 DIAGNOSIS — R05 Cough: Secondary | ICD-10-CM | POA: Insufficient documentation

## 2017-11-05 NOTE — Telephone Encounter (Signed)
Scheduled appt per 2/18 los - Gave patient AVS and calender per los. -

## 2017-11-06 ENCOUNTER — Other Ambulatory Visit: Payer: Self-pay | Admitting: Hematology and Oncology

## 2017-11-06 DIAGNOSIS — M329 Systemic lupus erythematosus, unspecified: Secondary | ICD-10-CM

## 2017-11-07 ENCOUNTER — Telehealth: Payer: Self-pay

## 2017-11-07 NOTE — Telephone Encounter (Signed)
Appointment scheduled for Friday 11/09/17 at [facility] Pulmonology with Dr. Vaughan Browner. Called patient and made her aware. Gave patient address and phone number for Maryanna Shape. Patient stated she will be at the appointment.

## 2017-11-08 NOTE — Assessment & Plan Note (Signed)
52 y.o. 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  Most recent labs demonstrate normal platelet count and no significant anemia or white blood cell count abnormalities.  Patient is currently in between her primary care providers and requests [place] to refer her to 1 of the providers available.  In addition, she does complain of cough over the past 3 months or more which has been dry, without associated symptoms.  Plan: -Refer to internal medicine for establishment of primary care role. -Refer to pulmonology for chronic cough assessment. -Return to our clinic in 3 months with labs for continued hematological monitoring.

## 2017-11-08 NOTE — Progress Notes (Signed)
[facility] Follow-up Visit:  Assessment: Dana Kennedy's syndrome [facility]) 52 y.o. 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  Most recent labs demonstrate normal platelet count and no significant anemia or white blood cell count abnormalities.  Patient is currently in between her primary care providers and requests [place] to refer her to 1 of the providers available.  In addition, she does complain of cough over the past 3 months or more which has been dry, without associated symptoms.  Plan: -Refer to internal medicine for establishment of primary care role. -Refer to pulmonology for chronic cough assessment. -Return to our clinic in 3 months with labs for continued hematological monitoring.    Voice recognition software was used and creation of this note. Despite my best effort at editing the text, some misspelling/errors may have occurred.  Orders Placed This Encounter  Procedures  . CBC with Differential (Cancer Center Only)    Standing Status:   Future    Standing Expiration Date:   11/05/2018  . CMP ([facility] only)    Standing Status:   Future    Standing Expiration Date:   11/05/2018  . Lactate dehydrogenase (LDH)    Standing Status:   Future    Standing Expiration Date:   11/05/2018  . Ambulatory referral to Pulmonology    Referral Priority:   Routine    Referral Type:   Consultation    Referral Reason:   Specialty Services Required    Requested Specialty:   Pulmonary Disease    Number of Visits Requested:   1  . Ambulatory referral to Internal Medicine    Referral Priority:   Routine    Referral Type:   Consultation    Referral Reason:   Specialty Services Required    Requested Specialty:   Internal Medicine    Number of Visits Requested:   1    Cancer Staging No matching staging information was found for the patient.  All questions were answered. . The patient knows to call the clinic with any problems, questions or  concerns.  This note was electronically signed.    History of Presenting Illness Dana Kennedy is a 52 y.o. female [facility] of relapsed ITP, last seen by Dr Orie Fisherman on 04/15/13. Patient has the diagnosis of underlying SLE and previously has been on prednisone and plaquenil. Her ITP has been treated with prednisone, rituximab, and IVIG in the past.   Presently, patient denies any fevers, chills, night sweats.  Reports chronic shortness of breath and cough of over 3 months duration without hemoptysis or chest pain..  Reports no significant gastrointestinal symptoms, no bruising, petechiae, ecchymosis, or pathological bleeding.  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     --Treatment: steroids/ IVIG --> Rituximab.  --Relapse, Dec 2013-Jan 2014: Likely triggered by a prolonged sinusitis problems with this had drop in platelets consistent with ITP flare.     --Treatment: Prednisone in late Jan, tapered down and off by 11/13/12.  --Labs, 11/12/12: WBC 5.0, Hgb 11.6, Plt 276.  --ED presentation/Relapse, 12/15/12: 5-7 days of bruising and oral bleeding, preceded by non-compliance with Plaquenil by Dr Charlestine Night x3 months    --Labs, 12/15/12: WBC 5.0, Hgb 12.2, Plt <5.     --Treatment: Steroids --Labs, 12/18/12: WBC     ..., Hgb     ..., Plt   47.  --Labs, 12/24/12:  WBC 12.8, Hgb 11.6, Plt 164; Steroid taper started --Labs, 12/31/12: WBC     ..., Hgb     ..., Plt     7 (without overt bleeding).   --Treatment: Rituximab QWk x4  --Labs, 10/31/17: WBC   8.2, Hgb 12.1, Plt 439  Medical History: Past Medical History:  Diagnosis Date  . Anemia   . Arthritis   . Asthma   . GERD (gastroesophageal reflux disease)   . Lupus     Surgical History: Past Surgical History:  Procedure Laterality Date  . stomach ulcer surgery  2008  . TONSILLECTOMY    .  TONSILLECTOMY    . TOTAL HIP ARTHROPLASTY  06/14/2012   Procedure: TOTAL HIP ARTHROPLASTY ANTERIOR APPROACH;  Surgeon: Mcarthur Rossetti, MD;  Location: [facility];  Service: Orthopedics;  Laterality: Left;  Left Total Hip Arthroplasty, Anterior Approach C-Arm    Family History: Family History  Problem Relation Age of Onset  . Cancer Mother   . Hypertension Mother     Social History: Social History   Socioeconomic History  . Marital status: Married    Spouse name: Not on file  . Number of children: Not on file  . Years of education: Not on file  . Highest education level: Not on file  Social Needs  . Financial resource strain: Not on file  . Food insecurity - worry: Not on file  . Food insecurity - inability: Not on file  . Transportation needs - medical: Not on file  . Transportation needs - non-medical: Not on file  Occupational History  . Not on file  Tobacco Use  . Smoking status: Former Smoker    Packs/day: 0.33    Years: 29.00    Pack years: 9.57    Types: Cigarettes    Last attempt to quit: 10/08/2013    Years since quitting: 4.0  . Smokeless tobacco: Never Used  Substance and Sexual Activity  . Alcohol use: Yes    Comment: occasionally  . Drug use: No  . Sexual activity: Yes  Other Topics Concern  . Not on file  Social History Narrative  . Not on file    Allergies: Allergies  Allergen Reactions  . Allegra [Fexofenadine]     Pt states she began having spells of dry cough after taking this medication  . Aspirin     Causes bleeding  . Eszopiclone Swelling    Felt like throat was closing. Not sure if it was related to this medication or not. lunesta  . Restoril [Temazepam]     Headaches     Medications:  Current Outpatient Medications  Medication Sig Dispense Refill  . albuterol (PROVENTIL HFA;VENTOLIN HFA) 108 (90 Base) MCG/ACT inhaler Inhale 2 puffs into the lungs every 4 (four) hours as needed for wheezing or shortness of breath. 1 Inhaler 2  .  azithromycin (ZITHROMAX) 250 MG tablet Take 1 tablet (250 mg total) by mouth daily. 4 tablet 0  . benzonatate (TESSALON) 100 MG capsule Take 1 capsule (100 mg total) by mouth every 8 (eight) hours. 21 capsule 0  . cefdinir (OMNICEF) 300 MG capsule Take 1 capsule (300 mg total) by mouth 2 (two) times daily. 20 capsule 0  . cetirizine (ZYRTEC) 10 MG tablet Take 1 tablet (10 mg total) by mouth daily. 30 tablet 5  . Guaifenesin 1200 MG TB12 Take 1 tablet (1,200 mg total) by mouth 2 (two) times daily. 14 each 0  . HYDROcodone-homatropine (HYCODAN) 5-1.5 MG/5ML syrup Take 5  mLs by mouth every 6 (six) hours as needed for cough. 75 mL 0  . meloxicam (MOBIC) 7.5 MG tablet Take 7.5-15 mg by mouth daily.  2  . methocarbamol (ROBAXIN) 500 MG tablet Take 1 tablet (500 mg total) by mouth every 8 (eight) hours as needed for muscle spasms. 21 tablet 0  . oxyCODONE-acetaminophen (PERCOCET/ROXICET) 5-325 MG tablet Take 1 tablet by mouth every 4 (four) hours as needed for severe pain.    . predniSONE (DELTASONE) 20 MG tablet Take 3 tablets (60 mg total) by mouth daily. 12 tablet 0   No current facility-administered medications for this visit.     Review of Systems: Review of Systems  All other systems reviewed and are negative.    PHYSICAL EXAMINATION Blood pressure (!) 151/92, pulse 78, temperature 97.9 F (36.6 C), temperature source Oral, resp. rate 18, height '5\' 5"'  (1.651 m), weight 206 lb 3.2 oz (93.5 kg), last menstrual period 10/24/2017, SpO2 99 %.  ECOG PERFORMANCE STATUS: 1 - Symptomatic but completely ambulatory  Physical Exam  Constitutional: She is oriented to person, place, and time and well-developed, well-nourished, and in no distress. No distress.  HENT:  Head: Normocephalic and atraumatic.  Mouth/Throat: Oropharynx is clear and moist. No oropharyngeal exudate.  Eyes: Conjunctivae and EOM are normal. Pupils are equal, round, and reactive to light. No scleral icterus.  Neck: No  thyromegaly present.  Cardiovascular: Normal rate, regular rhythm and normal heart sounds.  No murmur heard. Pulmonary/Chest: Effort normal and breath sounds normal. No respiratory distress. She has no wheezes. She has no rales.  Abdominal: Soft. Bowel sounds are normal. She exhibits no distension and no mass. There is no tenderness. There is no guarding.  Musculoskeletal: She exhibits no edema.  Lymphadenopathy:    She has no cervical adenopathy.  Neurological: She is alert and oriented to person, place, and time. She has normal reflexes. No cranial nerve deficit.  Skin: Skin is warm and dry. No rash noted. She is not diaphoretic. No erythema. No pallor.     LABORATORY DATA: I have personally reviewed the data as listed: Admission on 10/31/2017, Discharged on 11/01/2017  Component Date Value Ref Range Status  . Sodium 10/31/2017 141  135 - 145 mmol/L Final  . Potassium 10/31/2017 4.1  3.5 - 5.1 mmol/L Final  . Chloride 10/31/2017 106  101 - 111 mmol/L Final  . CO2 10/31/2017 24  22 - 32 mmol/L Final  . Glucose, Bld 10/31/2017 203* 65 - 99 mg/dL Final  . BUN 10/31/2017 6  6 - 20 mg/dL Final  . Creatinine, Ser 10/31/2017 0.71  0.44 - 1.00 mg/dL Final  . Calcium 10/31/2017 9.4  8.9 - 10.3 mg/dL Final  . GFR calc non Af Amer 10/31/2017 >60  >60 mL/min Final  . GFR calc Af Amer 10/31/2017 >60  >60 mL/min Final   Comment: (NOTE) The eGFR has been calculated using the CKD EPI equation. This calculation has not been validated in all clinical situations. eGFR's persistently <60 mL/min signify possible Chronic Kidney Disease.   Georgiann Hahn gap 10/31/2017 11  5 - 15 Final   Performed at [facility], [place] [address] [place], [place] [address]  . WBC 10/31/2017 8.2  4.0 - 10.5 K/uL Final  . RBC 10/31/2017 4.49  3.87 - 5.11 MIL/uL Final  . Hemoglobin 10/31/2017 12.1  12.0 - 15.0 g/dL Final  . HCT 10/31/2017 37.6  36.0 - 46.0 % Final  . MCV 10/31/2017 83.7  78.0 -  100.0 fL Final  . MCH  10/31/2017 26.9  26.0 - 34.0 pg Final  . MCHC 10/31/2017 32.2  30.0 - 36.0 g/dL Final  . RDW 10/31/2017 15.4  11.5 - 15.5 % Final  . Platelets 10/31/2017 439* 150 - 400 K/uL Final  . Neutrophils Relative % 10/31/2017 64  % Final  . Neutro Abs 10/31/2017 5.2  1.7 - 7.7 K/uL Final  . Lymphocytes Relative 10/31/2017 26  % Final  . Lymphs Abs 10/31/2017 2.1  0.7 - 4.0 K/uL Final  . Monocytes Relative 10/31/2017 6  % Final  . Monocytes Absolute 10/31/2017 0.5  0.1 - 1.0 K/uL Final  . Eosinophils Relative 10/31/2017 4  % Final  . Eosinophils Absolute 10/31/2017 0.3  0.0 - 0.7 K/uL Final  . Basophils Relative 10/31/2017 0  % Final  . Basophils Absolute 10/31/2017 0.0  0.0 - 0.1 K/uL Final   Performed at [facility], [place] [address], [place], [place] [address]  . Troponin i, poc 10/31/2017 0.00  0.00 - 0.08 ng/mL Final  . Comment 3 10/31/2017          Final   Comment: Due to the release kinetics of cTnI, a negative result within the first hours of the onset of symptoms does not rule out myocardial infarction with certainty. If myocardial infarction is still suspected, repeat the test at appropriate intervals.        Ardath Sax, MD

## 2017-11-09 ENCOUNTER — Telehealth: Payer: Self-pay | Admitting: Hematology and Oncology

## 2017-11-09 ENCOUNTER — Institutional Professional Consult (permissible substitution): Payer: Medicaid Other | Admitting: Pulmonary Disease

## 2017-11-09 NOTE — Telephone Encounter (Signed)
I emailed Sherry Ruffing regarding referral from Dr Lebron Conners for new PCP.  She will handle this referral per message from her today.

## 2017-11-12 ENCOUNTER — Ambulatory Visit (INDEPENDENT_AMBULATORY_CARE_PROVIDER_SITE_OTHER): Payer: Medicaid Other | Admitting: Pulmonary Disease

## 2017-11-12 ENCOUNTER — Encounter: Payer: Self-pay | Admitting: Pulmonary Disease

## 2017-11-12 ENCOUNTER — Other Ambulatory Visit (INDEPENDENT_AMBULATORY_CARE_PROVIDER_SITE_OTHER): Payer: Medicaid Other

## 2017-11-12 VITALS — BP 140/80 | HR 86 | Ht 65.0 in | Wt 204.0 lb

## 2017-11-12 DIAGNOSIS — J45909 Unspecified asthma, uncomplicated: Secondary | ICD-10-CM | POA: Diagnosis not present

## 2017-11-12 DIAGNOSIS — R059 Cough, unspecified: Secondary | ICD-10-CM

## 2017-11-12 DIAGNOSIS — R05 Cough: Secondary | ICD-10-CM

## 2017-11-12 DIAGNOSIS — L930 Discoid lupus erythematosus: Secondary | ICD-10-CM | POA: Diagnosis not present

## 2017-11-12 LAB — CBC WITH DIFFERENTIAL/PLATELET
BASOS PCT: 0.7 % (ref 0.0–3.0)
Basophils Absolute: 0 10*3/uL (ref 0.0–0.1)
EOS PCT: 5 % (ref 0.0–5.0)
Eosinophils Absolute: 0.3 10*3/uL (ref 0.0–0.7)
HEMATOCRIT: 37.4 % (ref 36.0–46.0)
HEMOGLOBIN: 12.5 g/dL (ref 12.0–15.0)
Lymphocytes Relative: 36.1 % (ref 12.0–46.0)
Lymphs Abs: 2.2 10*3/uL (ref 0.7–4.0)
MCHC: 33.3 g/dL (ref 30.0–36.0)
MCV: 81.6 fl (ref 78.0–100.0)
MONOS PCT: 11 % (ref 3.0–12.0)
Monocytes Absolute: 0.7 10*3/uL (ref 0.1–1.0)
Neutro Abs: 2.9 10*3/uL (ref 1.4–7.7)
Neutrophils Relative %: 47.2 % (ref 43.0–77.0)
Platelets: 421 10*3/uL — ABNORMAL HIGH (ref 150.0–400.0)
RBC: 4.58 Mil/uL (ref 3.87–5.11)
RDW: 15.6 % — AB (ref 11.5–15.5)
WBC: 6.2 10*3/uL (ref 4.0–10.5)

## 2017-11-12 LAB — NITRIC OXIDE: NITRIC OXIDE: 31

## 2017-11-12 MED ORDER — BUDESONIDE-FORMOTEROL FUMARATE 160-4.5 MCG/ACT IN AERO: 2.0000 | INHALATION_SPRAY | Freq: Two times a day (BID) | RESPIRATORY_TRACT | 0 refills | Status: DC

## 2017-11-12 MED ORDER — OMEPRAZOLE 20 MG PO CPDR: 20.0000 mg | DELAYED_RELEASE_CAPSULE | Freq: Every day | ORAL | 11 refills | Status: DC

## 2017-11-12 MED ORDER — BUDESONIDE-FORMOTEROL FUMARATE 160-4.5 MCG/ACT IN AERO: 2.0000 | INHALATION_SPRAY | Freq: Two times a day (BID) | RESPIRATORY_TRACT | 6 refills | Status: DC

## 2017-11-12 MED ORDER — FLUTICASONE PROPIONATE 50 MCG/ACT NA SUSP: 2.0000 | Freq: Every day | NASAL | 2 refills | Status: DC

## 2017-11-12 MED ORDER — CHLORPHENIRAMINE MALEATE 4 MG PO TABS
8.0000 mg | ORAL_TABLET | Freq: Three times a day (TID) | ORAL | 0 refills | Status: DC
Start: 2017-11-12 — End: 2017-12-25

## 2017-11-12 NOTE — Patient Instructions (Addendum)
We will check sputum for AFB, fungus, regular cultures Blood test for CBC with differential, blood allergy profile, mold antibody panel, hypersensitivity panel We will start you on Symbicort 160/4.5, continue albuterol as needed We will schedule you for pulmonary function tests Referred to rheumatology for management of lupus We will start you on chlorpheniramine and Flonase for postnasal drip Start Prilosec 20 mg a day for acid reflux. Follow-up in 1-2 months.

## 2017-11-12 NOTE — Progress Notes (Signed)
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  Being followed by Dr. Lebron Conners, hematology.  Referred for evaluation of chronic cough.  She has these complaints for the past 2 years  She has albuterol inhaler which is used several times a day.  C/o cough with dark, black mucus production, dyspnea with activity and at rest, occasional wheezing. Reports seasonal allergies, postnasal drip and acid reflux. Seen in the ED for 10/31/17 with chest pain, cough. CT scan did not show pulmonary embolism but there was scattered groundglass opacity and she has been treated with antibiotics and prednisone.  She reports that this improved her symptoms.  History noted for Evans syndrome, lupus.  She was followed by Dr. Charlestine Night, Rheumatology and treated with Plaquenil in past.  However she has no treatment for at least 5 years.  She was seen at [facility] in 2016 at the rheumatology clinic but did not follow-up.  Pets: No pets Occupation: Used to work at the hospital joint.  Currently disabled Exposures: Reports exposure to mold at previous home for several years.  She moved out in October 2018  Has carpet in the bedroom, tile floor elsewhere. Forced air heating.  She changes the air filters regularly. Smoking history: 29-pack-year smoking.  Continues to smoke 5 cigarettes a day Travel History: Grew up in [place].  Moved to [place] in 1990.  No other significant travel  Outpatient Encounter Medications as of 11/12/2017  Medication Sig  . albuterol (PROVENTIL HFA;VENTOLIN HFA) 108 (90 Base) MCG/ACT inhaler Inhale 2 puffs into the lungs every 4 (four) hours as needed for wheezing or shortness of breath.  . [DISCONTINUED] azithromycin (ZITHROMAX) 250 MG tablet Take 1 tablet (250  mg total) by mouth daily.  . [DISCONTINUED] cefdinir (OMNICEF) 300 MG capsule Take 1 capsule (300 mg total) by mouth 2 (two) times daily.  . [DISCONTINUED] Guaifenesin 1200 MG TB12 Take 1 tablet (1,200 mg total) by mouth 2 (two) times daily.  . [DISCONTINUED] HYDROcodone-homatropine (HYCODAN) 5-1.5 MG/5ML syrup Take 5 mLs by mouth every 6 (six) hours as needed for cough.  . [DISCONTINUED] meloxicam (MOBIC) 7.5 MG tablet Take 7.5-15 mg by mouth daily.  . [DISCONTINUED] methocarbamol (ROBAXIN) 500 MG tablet Take 1 tablet (500 mg total) by mouth every 8 (eight) hours as needed for muscle spasms.  . [DISCONTINUED] oxyCODONE-acetaminophen (PERCOCET/ROXICET) 5-325 MG tablet Take 1 tablet by mouth every 4 (four) hours as needed for severe pain.  . benzonatate (TESSALON) 100 MG capsule Take 1 capsule (100 mg total) by mouth every 8 (eight) hours. (Patient not taking: Reported on 11/12/2017)  . [DISCONTINUED] cetirizine (ZYRTEC) 10 MG tablet Take 1 tablet (10 mg total) by mouth daily. (Patient not taking: Reported on 11/12/2017)  . [DISCONTINUED] predniSONE (DELTASONE) 20 MG tablet Take 3 tablets (60 mg total) by mouth daily. (Patient not taking: Reported on 11/12/2017)   No facility-administered encounter medications on file as of 11/12/2017.     Allergies as of 11/12/2017 - Review Complete 11/12/2017  Allergen Reaction Noted  . Allegra [fexofenadine]  04/04/2017  . Aspirin  05/08/2013  . Eszopiclone Swelling 11/20/2011  . Restoril [temazepam]  12/15/2012    Past Medical History:  Diagnosis Date  .  Anemia   . Arthritis   . Asthma   . GERD (gastroesophageal reflux disease)   . Lupus   . Lupus     Past Surgical History:  Procedure Laterality Date  . stomach ulcer surgery  2008  . TONSILLECTOMY    . TONSILLECTOMY    . TOTAL HIP ARTHROPLASTY  06/14/2012   Procedure: TOTAL HIP ARTHROPLASTY ANTERIOR APPROACH;  Surgeon: Mcarthur Rossetti, MD;  Location: [facility];  Service: Orthopedics;   Laterality: Left;  Left Total Hip Arthroplasty, Anterior Approach C-Arm    Family History  Problem Relation Age of Onset  . Cancer Mother   . Hypertension Mother     Social History   Socioeconomic History  . Marital status: Married    Spouse name: Not on file  . Number of children: Not on file  . Years of education: Not on file  . Highest education level: Not on file  Social Needs  . Financial resource strain: Not on file  . Food insecurity - worry: Not on file  . Food insecurity - inability: Not on file  . Transportation needs - medical: Not on file  . Transportation needs - non-medical: Not on file  Occupational History  . Not on file  Tobacco Use  . Smoking status: Current Every Day Smoker    Packs/day: 0.25    Years: 29.00    Pack years: 7.25    Types: Cigarettes    Last attempt to quit: 10/08/2013    Years since quitting: 4.0  . Smokeless tobacco: Never Used  Substance and Sexual Activity  . Alcohol use: Yes    Comment: occasionally  . Drug use: No  . Sexual activity: Yes  Other Topics Concern  . Not on file  Social History Narrative  . Not on file    Review of systems: Review of Systems  Constitutional: Negative for fever and chills.  HENT: Negative.   Eyes: Negative for blurred vision.  Respiratory: as per HPI  Cardiovascular: Negative for chest pain and palpitations.  Gastrointestinal: Negative for vomiting, diarrhea, blood per rectum. Genitourinary: Negative for dysuria, urgency, frequency and hematuria.  Musculoskeletal: Negative for myalgias, back pain and joint pain.  Skin: Negative for itching and rash.  Neurological: Negative for dizziness, tremors, focal weakness, seizures and loss of consciousness.  Endo/Heme/Allergies: Negative for environmental allergies.  Psychiatric/Behavioral: Negative for depression, suicidal ideas and hallucinations.  All other systems reviewed and are negative.  Physical Exam: Blood pressure 140/80, pulse 86, height  5\' 5"  (1.651 m), weight 204 lb (92.5 kg), last menstrual period 10/24/2017, SpO2 96 %. Gen:      No acute distress HEENT:  EOMI, sclera anicteric Neck:     No masses; no thyromegaly Lungs:    Clear to auscultation bilaterally; normal respiratory effort CV:         Regular rate and rhythm; no murmurs Abd:      + bowel sounds; soft, non-tender; no palpable masses, no distension Ext:    No edema; adequate peripheral perfusion Skin:      Warm and dry; no rash Neuro: alert and oriented x 3 Psych: normal mood and affect  Data Reviewed: Serologies 09/02/10-  Positive ANA 1:2560, speckled, Ro 63, RNP 48 CCP less than 2, double-stranded DNA 15, rheumatoid factor 20, LA [place] 10/21/14 Positive ANA, 1;160, diffuse pattern  CT angiogram PE protocol-11/01/17 Lower lobe bronchial thickening, scattered groundglass opacities, air-trapping.  Left lung calcified nodule and mediastinal nodules which is  stable since 2011.  I have reviewed the images personally.  FENO 11/12/17-31  Assessment:  Chronic cough, moderate persistent asthma Will evaluate with CBC differential, blood allergy profile and pulmonary function test Start Symbicort 160/4.5, continue albuterol as needed She has significant postnasal drip and acid reflux which are contributing to symptoms Start chlorpheniramine antihistamine and Flonase nasal spray for postnasal drip, start Prilosec for GERD.  Reports significant mold exposure recently.  We will check sputum for cultures including fungus, hypersensitivity profile, mold antibody panel.  History of lupus Will need referral back to rheumatology for management.  CT scan noted with mild groundglass opacities which could be from lupus pneumonitis.  She has been treated with antibiotics and prednisone.  Will reevaluate with her CT in a couple of months to assess for resolution.  Smoker Review PFTs for evidence of COPD, Smoking cessation encouraged.  Plan/Recommendations: -  Sputum for culture - CBC, blood allergy profile, hypersensitivity panel, mold antibody, PFTs - Start Symbicort, continue albuterol - Chlorpheniramine, Flonase, Prilosec - Referral to rheumaotlogy  Marshell Garfinkel MD  Pulmonary and Critical Care Pager [phone] 11/12/2017, 9:28 AM  CC: Ardath Sax, MD

## 2017-11-13 LAB — ALLERGY PANEL 11, MOLD GROUP
Allergen, A. alternata, m6: <0.1 kU/L
Allergen, Mucor Racemosus, M4: <0.1 kU/L
Aspergillus fumigatus, m3: <0.1 kU/L
CLADOSPORIUM HERBARUM (M2) IGE: <0.1 kU/L
CLASS: 0
CLASS: 0
Class: 0
Class: 0
Class: 0

## 2017-11-13 LAB — RESPIRATORY ALLERGY PROFILE REGION II ~~LOC~~
Allergen, A. alternata, m6: <0.1 kU/L
Allergen, D pternoyssinus,d7: <0.1 kU/L
Allergen, Mouse Urine Protein, e78: <0.1 kU/L
Allergen, Oak,t7: <0.1 kU/L
Allergen, P. notatum, m1: <0.1 kU/L
Box Elder IgE: <0.1 kU/L
CLASS: 0
CLASS: 0
CLASS: 0
CLASS: 0
CLASS: 0
CLASS: 0
CLASS: 0
CLASS: 0
CLASS: 0
COMMON RAGWEED (SHORT) (W1) IGE: 0.41 kU/L — AB
Class: 0
Class: 0
Class: 0
Class: 0
Class: 0
Class: 0
Class: 0
Class: 0
Class: 0
Class: 0
Class: 0
Class: 0
Class: 0
Class: 0
Class: 1
D. farinae: <0.1 kU/L
Elm IgE: <0.1 kU/L
IgE (Immunoglobulin E), Serum: 4 kU/L (ref <=114)
Johnson Grass: <0.1 kU/L
Timothy Grass: <0.1 kU/L

## 2017-11-13 LAB — INTERPRETATION:

## 2017-11-15 LAB — RESPIRATORY CULTURE OR RESPIRATORY AND SPUTUM CULTURE
MICRO NUMBER: 90243524
RESULT:: NORMAL
SPECIMEN QUALITY: ADEQUATE

## 2017-11-16 ENCOUNTER — Telehealth: Payer: Self-pay | Admitting: Pulmonary Disease

## 2017-11-16 DIAGNOSIS — M329 Systemic lupus erythematosus, unspecified: Secondary | ICD-10-CM

## 2017-11-16 LAB — HYPERSENSITIVITY PNEUMONITIS
A. Pullulans Abs: NEGATIVE
A.Fumigatus #1 Abs: NEGATIVE
Micropolyspora faeni, IgG: NEGATIVE
PIGEON SERUM ABS: NEGATIVE
THERMOACT. SACCHARII: NEGATIVE
Thermoactinomyces vulgaris, IgG: NEGATIVE

## 2017-11-16 NOTE — Telephone Encounter (Signed)
Refer to any other Rheumatology practice that will take her Diagnosis is Lupus

## 2017-11-16 NOTE — Telephone Encounter (Signed)
Order placed for pt to be referred to a different rheumatology office.  Nothing further needed at this current time.

## 2017-11-16 NOTE — Telephone Encounter (Signed)
Spoke with Seth Bake at [facility] office will not see the patient-Andrea could not give me exact reason as MD did not tell her. Was told that usually if they decline seeing a patient its due to the dx,etc.  PM please advise. Thanks.

## 2017-11-19 ENCOUNTER — Ambulatory Visit: Payer: Medicaid Other | Admitting: Family Medicine

## 2017-11-22 ENCOUNTER — Ambulatory Visit: Payer: Self-pay | Admitting: Obstetrics

## 2017-12-03 ENCOUNTER — Ambulatory Visit: Payer: Medicaid Other | Admitting: Family Medicine

## 2017-12-12 LAB — FUNGUS CULTURE W SMEAR
MICRO NUMBER: 90243523
SPECIMEN QUALITY:: ADEQUATE

## 2017-12-25 ENCOUNTER — Telehealth: Payer: Self-pay | Admitting: Pulmonary Disease

## 2017-12-25 ENCOUNTER — Encounter: Payer: Self-pay | Admitting: Pulmonary Disease

## 2017-12-25 ENCOUNTER — Ambulatory Visit (INDEPENDENT_AMBULATORY_CARE_PROVIDER_SITE_OTHER): Payer: Medicaid Other | Admitting: Pulmonary Disease

## 2017-12-25 ENCOUNTER — Ambulatory Visit (INDEPENDENT_AMBULATORY_CARE_PROVIDER_SITE_OTHER)
Admission: RE | Admit: 2017-12-25 | Discharge: 2017-12-25 | Disposition: A | Discharge status: Home / Self Care | Payer: Medicaid Other | Source: Ambulatory Visit | Attending: Pulmonary Disease | Admitting: Pulmonary Disease

## 2017-12-25 VITALS — BP 130/78 | HR 94 | Ht 65.0 in | Wt 209.0 lb

## 2017-12-25 DIAGNOSIS — R0602 Shortness of breath: Secondary | ICD-10-CM

## 2017-12-25 DIAGNOSIS — R059 Cough, unspecified: Secondary | ICD-10-CM

## 2017-12-25 DIAGNOSIS — Z7689 Persons encountering health services in other specified circumstances: Secondary | ICD-10-CM

## 2017-12-25 DIAGNOSIS — J45909 Unspecified asthma, uncomplicated: Secondary | ICD-10-CM | POA: Diagnosis not present

## 2017-12-25 DIAGNOSIS — R05 Cough: Secondary | ICD-10-CM | POA: Diagnosis not present

## 2017-12-25 LAB — PULMONARY FUNCTION TEST
DL/VA % PRED: 130 %
DL/VA: 6.42 ml/min/mmHg/L
DLCO UNC % PRED: 81 %
DLCO cor % pred: 83 %
DLCO cor: 21.4 ml/min/mmHg
DLCO unc: 20.78 ml/min/mmHg
FEF 25-75 PRE: 1.7 L/s
FEF 25-75 Post: 2.15 L/sec
FEF2575-%CHANGE-POST: 26 %
FEF2575-%Pred-Post: 86 %
FEF2575-%Pred-Pre: 68 %
FEV1-%CHANGE-POST: 12 %
FEV1-%PRED-PRE: 63 %
FEV1-%Pred-Post: 71 %
FEV1-PRE: 1.52 L
FEV1-Post: 1.71 L
FEV1FVC-%Change-Post: 0 %
FEV1FVC-%Pred-Pre: 101 %
FEV6-%Change-Post: 11 %
FEV6-%PRED-POST: 70 %
FEV6-%PRED-PRE: 62 %
FEV6-POST: 2.05 L
FEV6-Pre: 1.84 L
FEV6FVC-%PRED-PRE: 103 %
FEV6FVC-%Pred-Post: 103 %
FVC-%Change-Post: 11 %
FVC-%Pred-Post: 68 %
FVC-%Pred-Pre: 61 %
FVC-Post: 2.05 L
FVC-Pre: 1.84 L
POST FEV6/FVC RATIO: 100 %
PRE FEV6/FVC RATIO: 100 %
Post FEV1/FVC ratio: 83 %
Pre FEV1/FVC ratio: 83 %
RV % PRED: 63 %
RV: 1.17 L
TLC % pred: 57 %
TLC: 3 L

## 2017-12-25 LAB — NITRIC OXIDE: NITRIC OXIDE: 13

## 2017-12-25 MED ORDER — HYDROCODONE-HOMATROPINE 5-1.5 MG/5ML PO SYRP: 5.0000 mL | ORAL_SOLUTION | Freq: Four times a day (QID) | ORAL | 0 refills | Status: DC | PRN

## 2017-12-25 MED ORDER — AZITHROMYCIN 250 MG PO TABS: ORAL_TABLET | ORAL | 0 refills | Status: AC

## 2017-12-25 MED ORDER — CHLORPHENIRAMINE MALEATE 4 MG PO TABS: 8.0000 mg | ORAL_TABLET | Freq: Three times a day (TID) | ORAL | 0 refills | Status: DC

## 2017-12-25 MED ORDER — PREDNISONE 10 MG PO TABS: ORAL_TABLET | ORAL | 0 refills | Status: DC

## 2017-12-25 NOTE — Progress Notes (Addendum)
Dana Kennedy    [id number]    [birth year]  Primary Care Physician:Patient, No Pcp Per  Referring Physician: No referring provider defined for this encounter.  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  Being followed by Dr. Lebron Conners, hematology.  Referred for evaluation of chronic cough.  She has these complaints for the past 2 years  She has albuterol inhaler which is used several times a day.  C/o cough with dark, black mucus production, dyspnea with activity and at rest, occasional wheezing. Reports seasonal allergies, postnasal drip and acid reflux. Seen in the ED for 10/31/17 with chest pain, cough. CT scan did not show pulmonary embolism but there was scattered groundglass opacity and she has been treated with antibiotics and prednisone.  She reports that this improved her symptoms.  History noted for Evans syndrome, lupus.  She was followed by Dr. Charlestine Night, Rheumatology and treated with Plaquenil in past.  However she has no treatment for at least 5 years.  She was seen at [facility] in 2016 at the rheumatology clinic but did not follow-up.  Pets: No pets Occupation: Used to work at the hospital joint.  Currently disabled Exposures: Reports exposure to mold at previous home for several years.  She moved out in October 2018  Has carpet in the bedroom, tile floor elsewhere. Forced air heating.  She changes the air filters regularly. Smoking history: 29-pack-year smoking.  Continues to smoke 5 cigarettes a day Travel History: Grew up in [place].  Moved to [place] in 1990.  No other significant travel  Interim history: Cough is worse.  She has paroxysms of cough with white mucus.  Feels that the Symbicort is not helping Has dyspnea when cough occurs.  Otherwise she is asymptomatic at baseline She is on the Prilosec but is not taking the chlorphentermine as directed  Outpatient Encounter  Medications as of 12/25/2017  Medication Sig  . albuterol (PROVENTIL HFA;VENTOLIN HFA) 108 (90 Base) MCG/ACT inhaler Inhale 2 puffs into the lungs every 4 (four) hours as needed for wheezing or shortness of breath.  . benzonatate (TESSALON) 100 MG capsule Take 1 capsule (100 mg total) by mouth every 8 (eight) hours.  . budesonide-formoterol (SYMBICORT) 160-4.5 MCG/ACT inhaler Inhale 2 puffs into the lungs 2 (two) times daily.  . chlorpheniramine (CHLOR-TRIMETON) 4 MG tablet Take 2 tablets (8 mg total) by mouth 3 (three) times daily.  . fluticasone (FLONASE) 50 MCG/ACT nasal spray Place 2 sprays into both nostrils daily.  Marland Kitchen omeprazole (PRILOSEC) 20 MG capsule Take 1 capsule (20 mg total) by mouth daily.  . budesonide-formoterol (SYMBICORT) 160-4.5 MCG/ACT inhaler Inhale 2 puffs into the lungs 2 (two) times daily for 1 day.   No facility-administered encounter medications on file as of 12/25/2017.     Allergies as of 12/25/2017 - Review Complete 12/25/2017  Allergen Reaction Noted  . Allegra [fexofenadine]  04/04/2017  . Aspirin  05/08/2013  . Eszopiclone Swelling 11/20/2011  . Restoril [temazepam]  12/15/2012    Past Medical History:  Diagnosis Date  . Anemia   . Arthritis   . Asthma   . GERD (gastroesophageal reflux disease)   . Lupus ([place])   . Lupus [facility])     Past Surgical History:  Procedure Laterality Date  . stomach ulcer surgery  2008  . TONSILLECTOMY    . TONSILLECTOMY    .  TOTAL HIP ARTHROPLASTY  06/14/2012   Procedure: TOTAL HIP ARTHROPLASTY ANTERIOR APPROACH;  Surgeon: Mcarthur Rossetti, MD;  Location: [facility]S;  Service: Orthopedics;  Laterality: Left;  Left Total Hip Arthroplasty, Anterior Approach C-Arm    Family History  Problem Relation Age of Onset  . Cancer Mother   . Hypertension Mother     Social History   Socioeconomic History  . Marital status: Married    Spouse name: Not on file  . Number of children: Not on file  . Years of education: Not on  file  . Highest education level: Not on file  Occupational History  . Not on file  Social Needs  . Financial resource strain: Not on file  . Food insecurity:    Worry: Not on file    Inability: Not on file  . Transportation needs:    Medical: Not on file    Non-medical: Not on file  Tobacco Use  . Smoking status: Former Smoker    Packs/day: 0.25    Years: 29.00    Pack years: 7.25    Types: Cigarettes    Last attempt to quit: 10/08/2013    Years since quitting: 4.2  . Smokeless tobacco: Never Used  . Tobacco comment: quit smoking 10/2017--12/25/17  Substance and Sexual Activity  . Alcohol use: Yes    Comment: occasionally  . Drug use: No  . Sexual activity: Yes  Lifestyle  . Physical activity:    Days per week: Not on file    Minutes per session: Not on file  . Stress: Not on file  Relationships  . Social connections:    Talks on phone: Not on file    Gets together: Not on file    Attends religious service: Not on file    Active member of club or organization: Not on file    Attends meetings of clubs or organizations: Not on file    Relationship status: Not on file  . Intimate partner violence:    Fear of current or ex partner: Not on file    Emotionally abused: Not on file    Physically abused: Not on file    Forced sexual activity: Not on file  Other Topics Concern  . Not on file  Social History Narrative  . Not on file    Review of systems: Review of Systems  Constitutional: Negative for fever and chills.  HENT: Negative.   Eyes: Negative for blurred vision.  Respiratory: as per HPI  Cardiovascular: Negative for chest pain and palpitations.  Gastrointestinal: Negative for vomiting, diarrhea, blood per rectum. Genitourinary: Negative for dysuria, urgency, frequency and hematuria.  Musculoskeletal: Negative for myalgias, back pain and joint pain.  Skin: Negative for itching and rash.  Neurological: Negative for dizziness, tremors, focal weakness, seizures  and loss of consciousness.  Endo/Heme/Allergies: Negative for environmental allergies.  Psychiatric/Behavioral: Negative for depression, suicidal ideas and hallucinations.  All other systems reviewed and are negative.  Physical Exam: Blood pressure 130/78, pulse 94, height 5\' 5"  (1.651 m), weight 209 lb (94.8 kg), SpO2 97 %. Gen:      No acute distress HEENT:  EOMI, sclera anicteric Neck:     No masses; no thyromegaly Lungs:    Clear to auscultation bilaterally; normal respiratory effort CV:         Regular rate and rhythm; no murmurs Abd:      + bowel sounds; soft, non-tender; no palpable masses, no distension Ext:    No edema; adequate  peripheral perfusion Skin:      Warm and dry; no rash Neuro: alert and oriented x 3 Psych: normal mood and affect  Data Reviewed: Serologies 09/02/10-  Positive ANA 1:2560, speckled, Ro 63, RNP 48 CCP less than 2, double-stranded DNA 15, rheumatoid factor 20, LA [place] 10/21/14 Positive ANA, 1;160, diffuse pattern  CBC 11/12/17-WBC 6.2, eos 5%, absolute eosinophil count 300 Blood allergy profile 11/12/17-IgE 4, mild sensitivity to ragweed. Hypersensitivity panel, mold antibody panel 11/12/17-negative Sputum culture 11/12/17-negative  CT angiogram PE protocol-11/01/17 Lower lobe bronchial thickening, scattered groundglass opacities, air-trapping.  Left lung calcified nodule and mediastinal nodules which is stable since 2011.  I have reviewed the images personally.  FENO 11/12/17-31 FENO 12/25/17-13  PFTs 12/25/17 FVC 2.05 [68%], FEV1 1.71 [71%], F/F 83 FEF 25-75% 1.7 [68%], post bronchodilator 2.15 [86%], +26% TLC 59%, DLCO/330% Small airways disease with improvement in flow rates post bronchodilator, restriction with increased diffusion capacity  Assessment:  Chronic cough, moderate persistent asthma Has worsening symptoms in spite of Symbicort Give Z-Pak, prednisone taper, chest x-ray Hycodan cough syrup  She has significant  postnasal drip and acid reflux which are contributing to symptoms Reemphasize that she needs to start chlorpheniramine antihistamine as she is not taking it as directed  Continue Flonase, Prilosec   History of lupus Referred back to rheumatology.  Apparently she needs a referral from primary care given her Medicaid status She does not have a primary care if present.  We will refer her to a primary care specialty.  Smoker Stopped smoking last month  Plan/Recommendations: - Z-Pak, prednisone taper, Hycodan cough syrup - Chest x-ray - Continue Symbicort, continue albuterol - Chlorpheniramine, Flonase, Prilosec - Referral to primary care  Marshell Garfinkel MD [facility] Pager [phone] 12/25/2017, 10:20 AM  CC: No ref. provider found

## 2017-12-25 NOTE — Patient Instructions (Signed)
Will give a prednisone taper starting at 40 mg.  Reduce dose by 10 mg every 3 days and Z-Pak  Chest x-ray today. Continue Symbicort Start chlorpheniramine 8 mg 3 times daily Continue Flonase and Prilosec We will give you Hycodan cough syrup for the cough We will make a referral to primary care Follow-up 1-2 months.

## 2017-12-25 NOTE — Telephone Encounter (Signed)
Called pt who stated an Rx of her allergy med was not sent in and she was currently at the pharmacy.  Looked at pt's OV from today with Dr. Vaughan Browner to verify which med needed to be sent in and sent script to pharmacy for pt.  Nothing further needed at this time.

## 2017-12-25 NOTE — Progress Notes (Signed)
PFT done today. 

## 2017-12-28 LAB — MYCOBACTERIA,CULT W/FLUOROCHROME SMEAR
MICRO NUMBER:: 90243526
SMEAR: NONE SEEN
SPECIMEN QUALITY:: ADEQUATE

## 2018-01-25 ENCOUNTER — Ambulatory Visit: Payer: Medicaid Other | Admitting: Pulmonary Disease

## 2018-02-01 ENCOUNTER — Encounter (HOSPITAL_COMMUNITY): Payer: Self-pay | Admitting: Emergency Medicine

## 2018-02-01 ENCOUNTER — Emergency Department (HOSPITAL_COMMUNITY): Payer: Medicaid Other

## 2018-02-01 ENCOUNTER — Emergency Department (HOSPITAL_COMMUNITY)
Admission: EM | Admit: 2018-02-01 | Discharge: 2018-02-01 | Disposition: A | Discharge status: Home / Self Care | Payer: Medicaid Other | Attending: Emergency Medicine | Admitting: Emergency Medicine

## 2018-02-01 ENCOUNTER — Inpatient Hospital Stay: Payer: Medicaid Other | Attending: Hematology and Oncology | Admitting: Hematology and Oncology

## 2018-02-01 ENCOUNTER — Inpatient Hospital Stay: Payer: Medicaid Other

## 2018-02-01 ENCOUNTER — Encounter: Payer: Self-pay | Admitting: Hematology and Oncology

## 2018-02-01 VITALS — BP 138/94 | HR 86 | Temp 98.4°F | Resp 18 | Ht 65.0 in | Wt 210.6 lb

## 2018-02-01 DIAGNOSIS — D693 Immune thrombocytopenic purpura: Secondary | ICD-10-CM | POA: Insufficient documentation

## 2018-02-01 DIAGNOSIS — M3210 Systemic lupus erythematosus, organ or system involvement unspecified: Secondary | ICD-10-CM | POA: Insufficient documentation

## 2018-02-01 DIAGNOSIS — D6941 Evans syndrome: Secondary | ICD-10-CM | POA: Diagnosis not present

## 2018-02-01 DIAGNOSIS — R112 Nausea with vomiting, unspecified: Secondary | ICD-10-CM

## 2018-02-01 DIAGNOSIS — Z87891 Personal history of nicotine dependence: Secondary | ICD-10-CM

## 2018-02-01 DIAGNOSIS — R1110 Vomiting, unspecified: Secondary | ICD-10-CM | POA: Insufficient documentation

## 2018-02-01 DIAGNOSIS — R1013 Epigastric pain: Secondary | ICD-10-CM | POA: Insufficient documentation

## 2018-02-01 DIAGNOSIS — Z79899 Other long term (current) drug therapy: Secondary | ICD-10-CM | POA: Diagnosis not present

## 2018-02-01 DIAGNOSIS — R1010 Upper abdominal pain, unspecified: Secondary | ICD-10-CM | POA: Insufficient documentation

## 2018-02-01 HISTORY — DX: Personal history of diseases of the blood and blood-forming organs and certain disorders involving the immune mechanism: Z86.2

## 2018-02-01 LAB — CMP (CANCER CENTER ONLY)
ALBUMIN: 3.7 g/dL (ref 3.5–5.0)
ALT: 10 U/L (ref 0–55)
AST: 10 U/L (ref 5–34)
Alkaline Phosphatase: 89 U/L (ref 40–150)
Anion gap: 8 (ref 3–11)
BUN: 12 mg/dL (ref 7–26)
CHLORIDE: 107 mmol/L (ref 98–109)
CO2: 27 mmol/L (ref 22–29)
Calcium: 8.9 mg/dL (ref 8.4–10.4)
Creatinine: 0.81 mg/dL (ref 0.60–1.10)
GFR, Est AFR Am: >60 mL/min (ref >60)
GFR, Estimated: >60 mL/min (ref >60)
GLUCOSE: 123 mg/dL (ref 70–140)
POTASSIUM: 3.7 mmol/L (ref 3.5–5.1)
Sodium: 142 mmol/L (ref 136–145)
Total Bilirubin: <0.2 mg/dL — ABNORMAL LOW (ref 0.2–1.2)
Total Protein: 6.7 g/dL (ref 6.4–8.3)

## 2018-02-01 LAB — CBC WITH DIFFERENTIAL (CANCER CENTER ONLY)
Basophils Absolute: 0 10*3/uL (ref 0.0–0.1)
Basophils Relative: 1 %
EOS PCT: 2 %
Eosinophils Absolute: 0.1 10*3/uL (ref 0.0–0.5)
HCT: 40.6 % (ref 34.8–46.6)
Hemoglobin: 13 g/dL (ref 11.6–15.9)
LYMPHS ABS: 2.5 10*3/uL (ref 0.9–3.3)
LYMPHS PCT: 43 %
MCH: 26.3 pg (ref 25.1–34.0)
MCHC: 32 g/dL (ref 31.5–36.0)
MCV: 82.2 fL (ref 79.5–101.0)
MONO ABS: 0.7 10*3/uL (ref 0.1–0.9)
MONOS PCT: 13 %
Neutro Abs: 2.4 10*3/uL (ref 1.5–6.5)
Neutrophils Relative %: 41 %
PLATELETS: 375 10*3/uL (ref 145–400)
RBC: 4.94 MIL/uL (ref 3.70–5.45)
RDW: 14.5 % (ref 11.2–14.5)
WBC Count: 5.7 10*3/uL (ref 3.9–10.3)

## 2018-02-01 LAB — LACTATE DEHYDROGENASE: LDH: 220 U/L (ref 125–245)

## 2018-02-01 LAB — LIPASE, BLOOD: LIPASE: 41 U/L (ref 11–51)

## 2018-02-01 MED ORDER — PANTOPRAZOLE SODIUM 40 MG IV SOLR
40.0000 mg | Freq: Once | INTRAVENOUS | Status: AC
  Administered 2018-02-01: 40 mg via INTRAVENOUS
  Filled 2018-02-01: qty 40

## 2018-02-01 MED ORDER — IOPAMIDOL (ISOVUE-300) INJECTION 61%
INTRAVENOUS | Status: AC
  Filled 2018-02-01: qty 100

## 2018-02-01 MED ORDER — MORPHINE SULFATE (PF) 4 MG/ML IV SOLN
4.0000 mg | Freq: Once | INTRAVENOUS | Status: AC
  Administered 2018-02-01: 4 mg via INTRAVENOUS
  Filled 2018-02-01: qty 1

## 2018-02-01 MED ORDER — SUCRALFATE 1 G PO TABS: 1.0000 g | ORAL_TABLET | Freq: Four times a day (QID) | ORAL | 0 refills | Status: DC

## 2018-02-01 MED ORDER — SODIUM CHLORIDE 0.9 % IV BOLUS
1000.0000 mL | Freq: Once | INTRAVENOUS | Status: AC
  Administered 2018-02-01: 1000 mL via INTRAVENOUS

## 2018-02-01 MED ORDER — PANTOPRAZOLE SODIUM 20 MG PO TBEC: 20.0000 mg | DELAYED_RELEASE_TABLET | Freq: Every day | ORAL | 0 refills | Status: DC

## 2018-02-01 MED ORDER — IOPAMIDOL (ISOVUE-300) INJECTION 61%
100.0000 mL | Freq: Once | INTRAVENOUS | Status: AC | PRN
  Administered 2018-02-01: 100 mL via INTRAVENOUS

## 2018-02-01 MED ORDER — GI COCKTAIL ~~LOC~~
30.0000 mL | Freq: Once | ORAL | Status: AC
  Administered 2018-02-01: 30 mL via ORAL
  Filled 2018-02-01: qty 30

## 2018-02-01 MED ORDER — HYDROCODONE-ACETAMINOPHEN 5-325 MG PO TABS: 2.0000 | ORAL_TABLET | ORAL | 0 refills | Status: DC | PRN

## 2018-02-01 MED ORDER — SODIUM CHLORIDE 0.9 % IV SOLN
INTRAVENOUS | Status: DC
  Administered 2018-02-01: 11:00:00 via INTRAVENOUS

## 2018-02-01 NOTE — ED Triage Notes (Signed)
Per pt, was at MD office today for f/u--states abdominal distention, vomiting on and off for 2 weeks-states 10/10 pain

## 2018-02-01 NOTE — ED Provider Notes (Signed)
[facility] DEPT Provider Note   CSN: [id number] Arrival date & time: 02/01/18  1324     History   Chief Complaint Chief Complaint  Patient presents with  . Abdominal Pain    HPI Dana Kennedy is a 52 y.o. female.  52 year old female presents with several month history of abdominal discomfort and distention.  Last few weeks has had periods of emesis that are worse in the afternoon and characterizes yellow.  No fever or chills.  Does have a history of lupus and was seen by her doctor today and sent here for further management.  States that symptoms are worse when she eats especially greasy or spicy food.  No associated bloody stools.  No treatment used prior to arrival     Past Medical History:  Diagnosis Date  . Anemia   . Arthritis   . Asthma   . GERD (gastroesophageal reflux disease)   . History of ITP   . Lupus ([place])   . Lupus [facility])     Patient Active Problem List   Diagnosis Date Noted  . 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 Continuous tobacco abuse 05/02/2016  . [name] ([place]) 05/02/2016  . Status post tonsillectomy 05/02/2016  . Dehydration 10/12/2013  . Idiopathic thrombocytopenic purpura ([place]) 09/05/2012  . Lupus (systemic lupus erythematosus) ([place]) 09/05/2012  . Avascular necrosis of hip ([place]) 06/14/2012    Past Surgical History:  Procedure Laterality Date  . stomach ulcer surgery  2008  . TONSILLECTOMY    . TONSILLECTOMY    . TOTAL HIP ARTHROPLASTY  06/14/2012   Procedure: TOTAL HIP ARTHROPLASTY ANTERIOR APPROACH;  Surgeon: Mcarthur Rossetti, MD;  Location: [facility];  Service: Orthopedics;  Laterality: Left;  Left Total Hip Arthroplasty, Anterior Approach C-Arm     OB History   None      Home Medications    Prior to Admission medications   Medication Sig Start Date End Date Taking? Authorizing Provider  albuterol (PROVENTIL HFA;VENTOLIN HFA) 108 (90 Base) MCG/ACT  inhaler Inhale 2 puffs into the lungs every 4 (four) hours as needed for wheezing or shortness of breath. 04/14/17   Pollina, Gwenyth Allegra, MD  benzonatate (TESSALON) 100 MG capsule Take 1 capsule (100 mg total) by mouth every 8 (eight) hours. 04/14/17   Orpah Greek, MD  budesonide-formoterol (SYMBICORT) 160-4.5 MCG/ACT inhaler Inhale 2 puffs into the lungs 2 (two) times daily for 1 day. 11/12/17 11/13/17  Marshell Garfinkel, MD  budesonide-formoterol (SYMBICORT) 160-4.5 MCG/ACT inhaler Inhale 2 puffs into the lungs 2 (two) times daily. 11/12/17   Mannam, Hart Robinsons, MD  chlorpheniramine (CHLOR-TRIMETON) 4 MG tablet Take 2 tablets (8 mg total) by mouth 3 (three) times daily. 12/25/17   Mannam, Hart Robinsons, MD  fluticasone (FLONASE) 50 MCG/ACT nasal spray Place 2 sprays into both nostrils daily. 11/12/17   Mannam, Hart Robinsons, MD  HYDROcodone-homatropine (HYCODAN) 5-1.5 MG/5ML syrup Take 5 mLs by mouth every 6 (six) hours as needed for cough. 12/25/17   Mannam, Hart Robinsons, MD  omeprazole (PRILOSEC) 20 MG capsule Take 1 capsule (20 mg total) by mouth daily. 11/12/17   Mannam, Hart Robinsons, MD  predniSONE (DELTASONE) 10 MG tablet 4 tabs x 3 days, 3 tabs x 3 days, 2 tabs x 3 days, 1 tab x 3 days then stop 12/25/17   Marshell Garfinkel, MD    Family History Family History  Problem Relation Age of Onset  . Cancer Mother   . Hypertension Mother  Social History Social History   Tobacco Use  . Smoking status: Former Smoker    Packs/day: 0.25    Years: 29.00    Pack years: 7.25    Types: Cigarettes    Last attempt to quit: 10/08/2013    Years since quitting: 4.3  . Smokeless tobacco: Never Used  . Tobacco comment: quit smoking 10/2017--12/25/17  Substance Use Topics  . Alcohol use: Yes    Comment: occasionally  . Drug use: No     Allergies   Allegra [fexofenadine]; Aspirin; Eszopiclone; and Restoril [temazepam]   Review of Systems Review of Systems  All other systems reviewed and are negative.    Physical  Exam Updated Vital Signs BP (!) 158/95 (BP Location: Right Arm)   Pulse 90   Temp 98 F (36.7 C) (Oral)   Resp (!) 22   Ht 1.651 m (5\' 5" )   Wt 95.3 kg (210 lb)   SpO2 98%   BMI 34.95 kg/m   Physical Exam  Constitutional: She is oriented to person, place, and time. She appears well-developed and well-nourished.  Non-toxic appearance. No distress.  HENT:  Head: Normocephalic and atraumatic.  Eyes: Pupils are equal, round, and reactive to light. Conjunctivae, EOM and lids are normal.  Neck: Normal range of motion. Neck supple. No tracheal deviation present. No thyroid mass present.  Cardiovascular: Normal rate, regular rhythm and normal heart sounds. Exam reveals no gallop.  No murmur heard. Pulmonary/Chest: Effort normal and breath sounds normal. No stridor. No respiratory distress. She has no decreased breath sounds. She has no wheezes. She has no rhonchi. She has no rales.  Abdominal: Soft. Normal appearance and bowel sounds are normal. She exhibits distension. There is generalized tenderness. There is no rigidity, no rebound, no guarding and no CVA tenderness.  Musculoskeletal: Normal range of motion. She exhibits no edema or tenderness.  Neurological: She is alert and oriented to person, place, and time. She has normal strength. No cranial nerve deficit or sensory deficit. GCS eye subscore is 4. GCS verbal subscore is 5. GCS motor subscore is 6.  Skin: Skin is warm and dry. No abrasion and no rash noted.  Psychiatric: She has a normal mood and affect. Her speech is normal and behavior is normal.  Nursing note and vitals reviewed.    ED Treatments / Results  Labs (all labs ordered are listed, but only abnormal results are displayed) Labs Reviewed  LIPASE, BLOOD    EKG None  Radiology No results found.  Procedures Procedures (including critical care time)  Medications Ordered in ED Medications  sodium chloride 0.9 % bolus 1,000 mL (1,000 mLs Intravenous New Bag/Given  02/01/18 1054)  0.9 %  sodium chloride infusion ( Intravenous New Bag/Given 02/01/18 1054)  pantoprazole (PROTONIX) injection 40 mg (40 mg Intravenous Given 02/01/18 1054)  gi cocktail (Maalox,Lidocaine,Donnatal) (30 mLs Oral Given 02/01/18 1054)     Initial Impression / Assessment and Plan / ED Course  I have reviewed the triage vital signs and the nursing notes.  Pertinent labs & imaging results that were available during my care of the patient were reviewed by me and considered in my medical decision making (see chart for details).    Abdominal CT negative here for acute pathology.  Labs from today from the cancer center reviewed and no acute abnormality's.  Lipase normal.  Case discussed with patient's hematologist who agrees with current work-up and agrees that patient needs GI follow-up.  Repeat abdominal exam at discharge remains stable.  Patient treated for likely GERD here and does have slight improvement.  Will give GI referral   Final Clinical Impressions(s) / ED Diagnoses   Final diagnoses:  None    ED Discharge Orders    None       Lacretia Leigh, MD 02/01/18 1440

## 2018-02-01 NOTE — ED Notes (Signed)
Bed: UY40 Expected date:  Expected time:  Means of arrival:  Comments: Hold for [employer]

## 2018-02-01 NOTE — ED Notes (Signed)
Per hematologist, patient is having abdominal distention and pain-n/v for 1 week-sending her for more thorough evaluation

## 2018-02-03 NOTE — Progress Notes (Signed)
[facility] Follow-up Visit:  Assessment: Dana Kennedy's syndrome [facility]) 52 y.o. 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  Hematological profile today reveals no significant abnormalities.  Platelet count is stable within normal range. Patient is complaining of a focus around  Persistent nausea and vomiting over 1 week.  No associated generalized symptoms other than the fatigue.  Patient has no evidence of weight loss.  No associated diarrhea.  Patient does have history of postsurgical hernias, but continues to pass bowel movement making bowel obstruction less likely.  Due to severity of the symptoms, recent onset, and what appears to be peritoneal irritation signs, I believe patient should be evaluated in the emergency room.  Plan: -Emergency room today for abdominal pain evaluation and management.  Consider CT of the abdomen and pelvis to assess for possible partial obstruction, cholecystitis, or appendicitis. -Patient has yet to establish with a primary care provider.  Will request assistance from our social worker to help the patient find a provider that accepts her current insurance. -Return to our clinic in 6 months with labs for continued hematological monitoring.   Voice recognition software was used and creation of this note. Despite my best effort at editing the text, some misspelling/errors may have occurred.  Orders Placed This Encounter  Procedures  . CBC with Differential (Cancer Center Only)    Standing Status:   Future    Standing Expiration Date:   02/02/2019  . CMP ([place] only)    Standing Status:   Future    Standing Expiration Date:   02/02/2019    Cancer Staging No matching staging information was found for the patient.  All questions were answered.  . The patient knows to call the clinic with any problems, questions or concerns.  This note was electronically signed.    History of Presenting Illness Dana Kennedy is a 52 y.o. African-American female followed in the [place] for diagnosis of relapsed ITP, last seen by Dr Orie Fisherman on 04/15/13. Patient has the diagnosis of underlying SLE and previously has been on prednisone and plaquenil. Her ITP has been treated with prednisone, rituximab, and IVIG in the past.   Patient returns to the clinic for routine monitoring.  Today, her complaints include generalized weakness, fatigue.  She reports that she has been experiencing nausea and vomiting 2-3 times per day on a daily basis for the past week associated with abdominal bloating and increasing abdominal pain.  Patient denies hematemesis.  Vomits recently ingested food or clear yellow liquid.  Last bowel movement last night without diarrhea, hematochezia, or melena.  No respiratory, genitourinary, or neurological symptoms.  No epistaxis, gum bleeding, or hematuria.  Additionally, patient complains of generalized body aches and muscle pains with previous history of fibromyalgia.  Due to lack of primary care provider, patient has not been receiving her usual opiate pain medication since at least February 2019.  Previously, treated with Percocet.  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                          --Treatment: steroids/ IVIG --> Rituximab.  --Relapse, Dec 2013-Jan 2014: Likely triggered by a prolonged sinusitis problems with this had drop in platelets consistent with ITP flare.                          --  Treatment: Prednisone in late Jan, tapered down and off by 11/13/12.  --Labs, 11/12/12: WBC 5.0, Hgb 11.6, Plt 276.  --ED presentation/Relapse, 12/15/12: 5-7 days of bruising and oral bleeding, preceded by non-compliance with Plaquenil by Dr Charlestine Night x3 months                         --Labs, 12/15/12: WBC 5.0, Hgb 12.2, Plt <5.               --Treatment: Steroids --Labs, 12/18/12: WBC     ..., Hgb      ..., Plt   47.  --Labs, 12/24/12: WBC 12.8, Hgb 11.6, Plt 164; Steroid taper started --Labs, 12/31/12: WBC     ..., Hgb     ..., Plt     7 (without overt bleeding).             --Treatment: Rituximab QWk x4  --Labs, 10/31/17: WBC   8.2, Hgb 12.1, Plt 439 --Labs, 02/01/18: WBC   5.7, Hgb 13.0, Plt 375    No history exists.    Medical History: Past Medical History:  Diagnosis Date  . Anemia   . Arthritis   . Asthma   . GERD (gastroesophageal reflux disease)   . History of ITP   . Lupus ([place])   . Lupus [facility])     Surgical History: Past Surgical History:  Procedure Laterality Date  . stomach ulcer surgery  2008  . TONSILLECTOMY    . TONSILLECTOMY    . TOTAL HIP ARTHROPLASTY  06/14/2012   Procedure: TOTAL HIP ARTHROPLASTY ANTERIOR APPROACH;  Surgeon: Mcarthur Rossetti, MD;  Location: [facility];  Service: Orthopedics;  Laterality: Left;  Left Total Hip Arthroplasty, Anterior Approach C-Arm    Family History: Family History  Problem Relation Age of Onset  . Cancer Mother   . Hypertension Mother     Social History: Social History   Socioeconomic History  . Marital status: Married    Spouse name: Not on file  . Number of children: Not on file  . Years of education: Not on file  . Highest education level: Not on file  Occupational History  . Not on file  Social Needs  . Financial resource strain: Not on file  . Food insecurity:    Worry: Not on file    Inability: Not on file  . Transportation needs:    Medical: Not on file    Non-medical: Not on file  Tobacco Use  . Smoking status: Former Smoker    Packs/day: 0.25    Years: 29.00    Pack years: 7.25    Types: Cigarettes    Last attempt to quit: 10/08/2013    Years since quitting: 4.3  . Smokeless tobacco: Never Used  . Tobacco comment: quit smoking 10/2017--12/25/17  Substance and Sexual Activity  . Alcohol use: Yes    Comment: occasionally  . Drug use: No  . Sexual activity: Yes  Lifestyle  . Physical  activity:    Days per week: Not on file    Minutes per session: Not on file  . Stress: Not on file  Relationships  . Social connections:    Talks on phone: Not on file    Gets together: Not on file    Attends religious service: Not on file    Active member of club or organization: Not on file    Attends meetings of clubs or organizations: Not on file    Relationship  status: Not on file  . Intimate partner violence:    Fear of current or ex partner: Not on file    Emotionally abused: Not on file    Physically abused: Not on file    Forced sexual activity: Not on file  Other Topics Concern  . Not on file  Social History Narrative  . Not on file    Allergies: Allergies  Allergen Reactions  . Allegra [Fexofenadine]     Pt states she began having spells of dry cough after taking this medication  . Aspirin     Causes bleeding  . Eszopiclone Swelling    Felt like throat was closing. Not sure if it was related to this medication or not. lunesta  . Restoril [Temazepam]     Headaches     Medications:  Current Outpatient Medications  Medication Sig Dispense Refill  . albuterol (PROVENTIL HFA;VENTOLIN HFA) 108 (90 Base) MCG/ACT inhaler Inhale 2 puffs into the lungs every 4 (four) hours as needed for wheezing or shortness of breath. 1 Inhaler 2  . benzonatate (TESSALON) 100 MG capsule Take 1 capsule (100 mg total) by mouth every 8 (eight) hours. (Patient not taking: Reported on 02/01/2018) 21 capsule 0  . budesonide-formoterol (SYMBICORT) 160-4.5 MCG/ACT inhaler Inhale 2 puffs into the lungs 2 (two) times daily. 1 Inhaler 6  . chlorpheniramine (CHLOR-TRIMETON) 4 MG tablet Take 2 tablets (8 mg total) by mouth 3 (three) times daily. 190 tablet 0  . fluticasone (FLONASE) 50 MCG/ACT nasal spray Place 2 sprays into both nostrils daily. 16 g 2  . HYDROcodone-acetaminophen (NORCO/VICODIN) 5-325 MG tablet Take 2 tablets by mouth every 4 (four) hours as needed. 10 tablet 0  .  HYDROcodone-homatropine (HYCODAN) 5-1.5 MG/5ML syrup Take 5 mLs by mouth every 6 (six) hours as needed for cough. (Patient not taking: Reported on 02/01/2018) 240 mL 0  . omeprazole (PRILOSEC) 20 MG capsule Take 1 capsule (20 mg total) by mouth daily. 30 capsule 11  . pantoprazole (PROTONIX) 20 MG tablet Take 1 tablet (20 mg total) by mouth daily. 30 tablet 0  . predniSONE (DELTASONE) 10 MG tablet 4 tabs x 3 days, 3 tabs x 3 days, 2 tabs x 3 days, 1 tab x 3 days then stop (Patient not taking: Reported on 02/01/2018) 30 tablet 0  . sucralfate (CARAFATE) 1 g tablet Take 1 tablet (1 g total) by mouth 4 (four) times daily. 30 tablet 0   No current facility-administered medications for this visit.     Review of Systems: Review of Systems  Constitutional: Negative for appetite change and fever.  Gastrointestinal: Positive for abdominal distention, abdominal pain, nausea and vomiting. Negative for constipation and diarrhea.  All other systems reviewed and are negative.    PHYSICAL EXAMINATION Blood pressure (!) 138/94, pulse 86, temperature 98.4 F (36.9 C), temperature source Oral, resp. rate 18, height '5\' 5"'  (1.651 m), weight 210 lb 9.6 oz (95.5 kg), SpO2 96 %.  ECOG PERFORMANCE STATUS: 2 - Symptomatic, <50% confined to bed  Physical Exam  Constitutional: She is oriented to person, place, and time. She appears well-developed and well-nourished. No distress.  HENT:  Head: Normocephalic and atraumatic.  Mouth/Throat: Oropharynx is clear and moist. No oropharyngeal exudate.  Eyes: Pupils are equal, round, and reactive to light. EOM are normal. No scleral icterus.  Neck: No thyromegaly present.  Cardiovascular: Normal rate, regular rhythm and normal heart sounds. Exam reveals no gallop and no friction rub.  No murmur heard. Pulmonary/Chest: Effort normal  and breath sounds normal. No stridor. No respiratory distress. She has no wheezes. She has no rales.  Abdominal: Soft. Bowel sounds are  normal. She exhibits distension. She exhibits no mass. There is tenderness. There is guarding.  Severe abdominal tenderness with mild distention.  Tenderness elicited with minimal touch or percussion.  Musculoskeletal: She exhibits no edema.  Lymphadenopathy:    She has no cervical adenopathy.  Neurological: She is alert and oriented to person, place, and time. She displays normal reflexes. No cranial nerve deficit or sensory deficit.  Skin: Skin is warm and dry. No rash noted. She is not diaphoretic. No erythema. No pallor.     LABORATORY DATA: I have personally reviewed the data as listed: Admission on 02/01/2018, Discharged on 02/01/2018  Component Date Value Ref Range Status  . Lipase 02/01/2018 41  11 - 51 U/L Final   Performed at [facility], [place] [address] [place], [place] [address]  Appointment on 02/01/2018  Component Date Value Ref Range Status  . LDH 02/01/2018 220  125 - 245 U/L Final   Performed at [facility], [place] [address] [place], [place] [address]  . Sodium 02/01/2018 142  136 - 145 mmol/L Final  . Potassium 02/01/2018 3.7  3.5 - 5.1 mmol/L Final  . Chloride 02/01/2018 107  98 - 109 mmol/L Final  . CO2 02/01/2018 27  22 - 29 mmol/L Final  . Glucose, Bld 02/01/2018 123  70 - 140 mg/dL Final  . BUN 02/01/2018 12  7 - 26 mg/dL Final  . Creatinine 02/01/2018 0.81  0.60 - 1.10 mg/dL Final  . Calcium 02/01/2018 8.9  8.4 - 10.4 mg/dL Final  . Total Protein 02/01/2018 6.7  6.4 - 8.3 g/dL Final  . Albumin 02/01/2018 3.7  3.5 - 5.0 g/dL Final  . AST 02/01/2018 10  5 - 34 U/L Final  . ALT 02/01/2018 10  0 - 55 U/L Final  . Alkaline Phosphatase 02/01/2018 89  40 - 150 U/L Final  . Total Bilirubin 02/01/2018 <0.2* 0.2 - 1.2 mg/dL Final  . GFR, Est Non Af Am 02/01/2018 >60  >60 mL/min Final  . GFR, Est AFR Am 02/01/2018 >60  >60 mL/min Final   Comment: (NOTE) The eGFR has been calculated using the CKD EPI equation. This  calculation has not been validated in all clinical situations. eGFR's persistently <60 mL/min signify possible Chronic Kidney Disease.   Georgiann Hahn gap 02/01/2018 8  3 - 11 Final   Performed at [facility], [place] [address], [place], [place] [address]  . WBC Count 02/01/2018 5.7  3.9 - 10.3 K/uL Final  . RBC 02/01/2018 4.94  3.70 - 5.45 MIL/uL Final  . Hemoglobin 02/01/2018 13.0  11.6 - 15.9 g/dL Final  . HCT 02/01/2018 40.6  34.8 - 46.6 % Final  . MCV 02/01/2018 82.2  79.5 - 101.0 fL Final  . MCH 02/01/2018 26.3  25.1 - 34.0 pg Final  . MCHC 02/01/2018 32.0  31.5 - 36.0 g/dL Final  . RDW 02/01/2018 14.5  11.2 - 14.5 % Final  . Platelet Count 02/01/2018 375  145 - 400 K/uL Final  . Neutrophils Relative % 02/01/2018 41  % Final  . Neutro Abs 02/01/2018 2.4  1.5 - 6.5 K/uL Final  . Lymphocytes Relative 02/01/2018 43  % Final  . Lymphs Abs 02/01/2018 2.5  0.9 - 3.3 K/uL Final  . Monocytes Relative 02/01/2018 13  % Final  .  Monocytes Absolute 02/01/2018 0.7  0.1 - 0.9 K/uL Final  . Eosinophils Relative 02/01/2018 2  % Final  . Eosinophils Absolute 02/01/2018 0.1  0.0 - 0.5 K/uL Final  . Basophils Relative 02/01/2018 1  % Final  . Basophils Absolute 02/01/2018 0.0  0.0 - 0.1 K/uL Final   Performed at [facility], [place] [address], [place], [place] [address]       Ardath Sax, MD

## 2018-02-03 NOTE — Assessment & Plan Note (Signed)
52 y.o. 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  Hematological profile today reveals no significant abnormalities.  Platelet count is stable within normal range. Patient is complaining of a focus around  Persistent nausea and vomiting over 1 week.  No associated generalized symptoms other than the fatigue.  Patient has no evidence of weight loss.  No associated diarrhea.  Patient does have history of postsurgical hernias, but continues to pass bowel movement making bowel obstruction less likely.  Due to severity of the symptoms, recent onset, and what appears to be peritoneal irritation signs, I believe patient should be evaluated in the emergency room.  Plan: -Emergency room today for abdominal pain evaluation and management.  Consider CT of the abdomen and pelvis to assess for possible partial obstruction, cholecystitis, or appendicitis. -Patient has yet to establish with a primary care provider.  Will request assistance from our social worker to help the patient find a provider that accepts her current insurance. -Return to our clinic in 6 months with labs for continued hematological monitoring.

## 2018-02-04 ENCOUNTER — Telehealth: Payer: Self-pay | Admitting: Hematology and Oncology

## 2018-02-04 NOTE — Telephone Encounter (Signed)
Appointments scheduled letter/calendar mailed per 5/17 los

## 2018-03-05 ENCOUNTER — Encounter: Payer: Self-pay | Admitting: Pulmonary Disease

## 2018-03-05 ENCOUNTER — Ambulatory Visit (INDEPENDENT_AMBULATORY_CARE_PROVIDER_SITE_OTHER): Payer: Medicaid Other | Admitting: Pulmonary Disease

## 2018-03-05 VITALS — BP 130/88 | HR 97 | Ht 65.0 in | Wt 205.8 lb

## 2018-03-05 DIAGNOSIS — J45909 Unspecified asthma, uncomplicated: Secondary | ICD-10-CM

## 2018-03-05 MED ORDER — FLUTICASONE FUROATE-VILANTEROL 200-25 MCG/INH IN AEPB: 1.0000 | INHALATION_SPRAY | Freq: Every day | RESPIRATORY_TRACT | 0 refills | Status: AC

## 2018-03-05 MED ORDER — NICOTINE 21-14-7 MG/24HR TD KIT: PACK | TRANSDERMAL | 0 refills | Status: DC

## 2018-03-05 NOTE — Patient Instructions (Addendum)
We will check a chest x-ray, check sputum for AFB, regular cultures We will stop the Symbicort and start Breo Make sure that you check with Medicare about which primary care is covered so that we can make a referral Follow-up in 6 months.

## 2018-03-05 NOTE — Progress Notes (Signed)
Dana Kennedy    [id number]    05-22-1966  Primary Care Physician:Patient, No Pcp Per  Referring Physician: No referring provider defined for this encounter.  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  Being followed by Dr. Lebron Conners, hematology.  Referred for evaluation of chronic cough.  She has these complaints for the past 2 years  She has albuterol inhaler which is used several times a day.  C/o cough with dark, black mucus production, dyspnea with activity and at rest, occasional wheezing. Reports seasonal allergies, postnasal drip and acid reflux. Seen in the ED for 10/31/17 with chest pain, cough. CT scan did not show pulmonary embolism but there was scattered groundglass opacity and she has been treated with antibiotics and prednisone.  She reports that this improved her symptoms.  History noted for Evans syndrome, lupus.  She was followed by Dr. Charlestine Night, Rheumatology and treated with Plaquenil in past.  However she has no treatment for at least 5 years.  She was seen at [facility] in 2016 at the rheumatology clinic but did not follow-up.  Pets: No pets Occupation: Used to work at the hospital joint.  Currently disabled Exposures: Reports exposure to mold at previous home for several years.  She moved out in October 2018  Has carpet in the bedroom, tile floor elsewhere. Forced air heating.  She changes the air filters regularly. Smoking history: 29-pack-year smoking.  Continues to smoke 5 cigarettes a day Travel History: Grew up in [place].  Moved to [place] in 1990.  No other significant travel  Interim history: Continues on Symbicort but feels it is not helping.  She has been referred to primary care but is unable to find one as she is Medicaid.  Continues to have dyspnea on exertion, cough, wheezing.  Outpatient Encounter Medications as of 03/05/2018  Medication Sig  . albuterol  (PROVENTIL HFA;VENTOLIN HFA) 108 (90 Base) MCG/ACT inhaler Inhale 2 puffs into the lungs every 4 (four) hours as needed for wheezing or shortness of breath.  . chlorpheniramine (CHLOR-TRIMETON) 4 MG tablet Take 2 tablets (8 mg total) by mouth 3 (three) times daily.  . fluticasone (FLONASE) 50 MCG/ACT nasal spray Place 2 sprays into both nostrils daily.  Marland Kitchen omeprazole (PRILOSEC) 20 MG capsule Take 1 capsule (20 mg total) by mouth daily.  . pantoprazole (PROTONIX) 20 MG tablet Take 1 tablet (20 mg total) by mouth daily.  . sucralfate (CARAFATE) 1 g tablet Take 1 tablet (1 g total) by mouth 4 (four) times daily.  . [DISCONTINUED] HYDROcodone-acetaminophen (NORCO/VICODIN) 5-325 MG tablet Take 2 tablets by mouth every 4 (four) hours as needed.  . [DISCONTINUED] HYDROcodone-homatropine (HYCODAN) 5-1.5 MG/5ML syrup Take 5 mLs by mouth every 6 (six) hours as needed for cough.  . [DISCONTINUED] predniSONE (DELTASONE) 10 MG tablet 4 tabs x 3 days, 3 tabs x 3 days, 2 tabs x 3 days, 1 tab x 3 days then stop  . budesonide-formoterol (SYMBICORT) 160-4.5 MCG/ACT inhaler Inhale 2 puffs into the lungs 2 (two) times daily. (Patient not taking: Reported on 03/05/2018)  . [DISCONTINUED] benzonatate (TESSALON) 100 MG capsule Take 1 capsule (100 mg total) by mouth every 8 (eight) hours. (Patient not taking: Reported on 02/01/2018)   No facility-administered encounter medications on file as of 03/05/2018.     Allergies as of 03/05/2018 - Review Complete 03/05/2018  Allergen Reaction Noted  .  Allegra [fexofenadine]  04/04/2017  . Aspirin  05/08/2013  . Eszopiclone Swelling 11/20/2011  . Restoril [temazepam]  12/15/2012    Past Medical History:  Diagnosis Date  . Anemia   . Arthritis   . Asthma   . GERD (gastroesophageal reflux disease)   . History of ITP   . Lupus ([place])   . Lupus [facility])     Past Surgical History:  Procedure Laterality Date  . stomach ulcer surgery  2008  . TONSILLECTOMY    . TONSILLECTOMY     . TOTAL HIP ARTHROPLASTY  06/14/2012   Procedure: TOTAL HIP ARTHROPLASTY ANTERIOR APPROACH;  Surgeon: Mcarthur Rossetti, MD;  Location: [facility];  Service: Orthopedics;  Laterality: Left;  Left Total Hip Arthroplasty, Anterior Approach C-Arm    Family History  Problem Relation Age of Onset  . Cancer Mother   . Hypertension Mother     Social History   Socioeconomic History  . Marital status: Married    Spouse name: Not on file  . Number of children: Not on file  . Years of education: Not on file  . Highest education level: Not on file  Occupational History  . Not on file  Social Needs  . Financial resource strain: Not on file  . Food insecurity:    Worry: Not on file    Inability: Not on file  . Transportation needs:    Medical: Not on file    Non-medical: Not on file  Tobacco Use  . Smoking status: Former Smoker    Packs/day: 0.25    Years: 29.00    Pack years: 7.25    Types: Cigarettes    Last attempt to quit: 10/08/2013    Years since quitting: 4.4  . Smokeless tobacco: Never Used  . Tobacco comment: quit smoking 02/19/18-03/05/18  Substance and Sexual Activity  . Alcohol use: Yes    Comment: occasionally  . Drug use: No  . Sexual activity: Yes  Lifestyle  . Physical activity:    Days per week: Not on file    Minutes per session: Not on file  . Stress: Not on file  Relationships  . Social connections:    Talks on phone: Not on file    Gets together: Not on file    Attends religious service: Not on file    Active member of club or organization: Not on file    Attends meetings of clubs or organizations: Not on file    Relationship status: Not on file  . Intimate partner violence:    Fear of current or ex partner: Not on file    Emotionally abused: Not on file    Physically abused: Not on file    Forced sexual activity: Not on file  Other Topics Concern  . Not on file  Social History Narrative  . Not on file    Review of systems: Review of Systems    Constitutional: Negative for fever and chills.  HENT: Negative.   Eyes: Negative for blurred vision.  Respiratory: as per HPI  Cardiovascular: Negative for chest pain and palpitations.  Gastrointestinal: Negative for vomiting, diarrhea, blood per rectum. Genitourinary: Negative for dysuria, urgency, frequency and hematuria.  Musculoskeletal: Negative for myalgias, back pain and joint pain.  Skin: Negative for itching and rash.  Neurological: Negative for dizziness, tremors, focal weakness, seizures and loss of consciousness.  Endo/Heme/Allergies: Negative for environmental allergies.  Psychiatric/Behavioral: Negative for depression, suicidal ideas and hallucinations.  All other systems reviewed and are  negative.  Physical Exam: Blood pressure 130/88, pulse 97, height 5\' 5"  (1.651 m), weight 205 lb 12.8 oz (93.4 kg), SpO2 96 %. Gen:      No acute distress HEENT:  EOMI, sclera anicteric Neck:     No masses; no thyromegaly Lungs:    Clear to auscultation bilaterally; normal respiratory effort CV:         Regular rate and rhythm; no murmurs Abd:      + bowel sounds; soft, non-tender; no palpable masses, no distension Ext:    No edema; adequate peripheral perfusion Skin:      Warm and dry; no rash Neuro: alert and oriented x 3 Psych: normal mood and affect  Data Reviewed: Serologies 09/02/10-  Positive ANA 1:2560, speckled, Ro 63, RNP 48 CCP less than 2, double-stranded DNA 15, rheumatoid factor 20, LA Reynolds 10/21/14 Positive ANA, 1;160, diffuse pattern  CBC 11/12/17-WBC 6.2, eos 5%, absolute eosinophil count 300 Blood allergy profile 11/12/17-IgE 4, mild sensitivity to ragweed. Hypersensitivity panel, mold antibody panel 11/12/17-negative Sputum culture 11/12/17-negative  CT angiogram PE protocol-11/01/17 Lower lobe bronchial thickening, scattered groundglass opacities, air-trapping.  Left lung calcified nodule and mediastinal nodules which is stable since 2011.  I have  reviewed the images personally.  FENO 11/12/17-31 FENO 12/25/17-13  PFTs 12/25/17 FVC 2.05 [68%], FEV1 1.71 [71%], F/F 83 FEF 25-75% 1.7 [68%], post bronchodilator 2.15 [86%], +26% TLC 59%, DLCO/330% Small airways disease with improvement in flow rates post bronchodilator, restriction with increased diffusion capacity  Assessment:  Chronic cough, moderate persistent asthma Continues to have persistent symptoms on Symbicort We will try Breo, stop Symbicort  Stated that she may have TB as she is exposed to several people undergoing treatment for TB.  Will get chest x-ray and sputum for AFB, cultures.   History of lupus Referred back to rheumatology but needs a referral from primary care to make this happen She will work on checking with Medicaid and getting set up with primary care.  Smoker Stop smoking last month  Plan/Recommendations: -Symbicort, Start Breo - Chest x-ray, sputum for cultures, AFB.  Marshell Garfinkel MD [facility] 03/05/2018, 4:05 PM  CC: No ref. provider found

## 2018-05-06 ENCOUNTER — Other Ambulatory Visit: Payer: Self-pay

## 2018-05-06 ENCOUNTER — Emergency Department (HOSPITAL_COMMUNITY): Payer: Medicaid Other

## 2018-05-06 ENCOUNTER — Emergency Department (HOSPITAL_COMMUNITY)
Admission: EM | Admit: 2018-05-06 | Discharge: 2018-05-06 | Disposition: A | Discharge status: Home / Self Care | Payer: Medicaid Other | Attending: Emergency Medicine | Admitting: Emergency Medicine

## 2018-05-06 ENCOUNTER — Encounter (HOSPITAL_COMMUNITY): Payer: Self-pay | Admitting: *Deleted

## 2018-05-06 DIAGNOSIS — R1903 Right lower quadrant abdominal swelling, mass and lump: Secondary | ICD-10-CM | POA: Insufficient documentation

## 2018-05-06 DIAGNOSIS — Z87891 Personal history of nicotine dependence: Secondary | ICD-10-CM | POA: Diagnosis not present

## 2018-05-06 DIAGNOSIS — R1031 Right lower quadrant pain: Secondary | ICD-10-CM | POA: Insufficient documentation

## 2018-05-06 DIAGNOSIS — Z79899 Other long term (current) drug therapy: Secondary | ICD-10-CM | POA: Insufficient documentation

## 2018-05-06 DIAGNOSIS — M3210 Systemic lupus erythematosus, organ or system involvement unspecified: Secondary | ICD-10-CM | POA: Diagnosis not present

## 2018-05-06 DIAGNOSIS — J45909 Unspecified asthma, uncomplicated: Secondary | ICD-10-CM | POA: Diagnosis not present

## 2018-05-06 DIAGNOSIS — K5792 Diverticulitis of intestine, part unspecified, without perforation or abscess without bleeding: Secondary | ICD-10-CM

## 2018-05-06 DIAGNOSIS — K439 Ventral hernia without obstruction or gangrene: Secondary | ICD-10-CM

## 2018-05-06 LAB — CBC
HEMATOCRIT: 40.1 % (ref 36.0–46.0)
Hemoglobin: 12.5 g/dL (ref 12.0–15.0)
MCH: 25.8 pg — AB (ref 26.0–34.0)
MCHC: 31.2 g/dL (ref 30.0–36.0)
MCV: 82.9 fL (ref 78.0–100.0)
Platelets: 316 10*3/uL (ref 150–400)
RBC: 4.84 MIL/uL (ref 3.87–5.11)
RDW: 16.1 % — ABNORMAL HIGH (ref 11.5–15.5)
WBC: 6.6 10*3/uL (ref 4.0–10.5)

## 2018-05-06 LAB — COMPREHENSIVE METABOLIC PANEL
ALBUMIN: 3.7 g/dL (ref 3.5–5.0)
ALK PHOS: 77 U/L (ref 38–126)
ALT: 14 U/L (ref 0–44)
ANION GAP: 9 (ref 5–15)
AST: 17 U/L (ref 15–41)
BUN: 10 mg/dL (ref 6–20)
CALCIUM: 9 mg/dL (ref 8.9–10.3)
CHLORIDE: 107 mmol/L (ref 98–111)
CO2: 25 mmol/L (ref 22–32)
Creatinine, Ser: 0.7 mg/dL (ref 0.44–1.00)
GFR calc non Af Amer: >60 mL/min (ref >60)
GLUCOSE: 101 mg/dL — AB (ref 70–99)
POTASSIUM: 4.5 mmol/L (ref 3.5–5.1)
SODIUM: 141 mmol/L (ref 135–145)
Total Bilirubin: 0.6 mg/dL (ref 0.3–1.2)
Total Protein: 6.3 g/dL — ABNORMAL LOW (ref 6.5–8.1)

## 2018-05-06 LAB — I-STAT BETA HCG BLOOD, ED (MC, WL, AP ONLY)

## 2018-05-06 LAB — LIPASE, BLOOD: LIPASE: 39 U/L (ref 11–51)

## 2018-05-06 MED ORDER — IOPAMIDOL (ISOVUE-300) INJECTION 61%
INTRAVENOUS | Status: AC
  Filled 2018-05-06: qty 100

## 2018-05-06 MED ORDER — HYDROCODONE-ACETAMINOPHEN 5-325 MG PO TABS: 1.0000 | ORAL_TABLET | ORAL | 0 refills | Status: DC | PRN

## 2018-05-06 MED ORDER — OXYCODONE-ACETAMINOPHEN 5-325 MG PO TABS: 1.0000 | ORAL_TABLET | Freq: Three times a day (TID) | ORAL | 0 refills | Status: DC | PRN

## 2018-05-06 MED ORDER — METRONIDAZOLE 500 MG PO TABS: 500.0000 mg | ORAL_TABLET | Freq: Two times a day (BID) | ORAL | 0 refills | Status: DC

## 2018-05-06 MED ORDER — IOPAMIDOL (ISOVUE-300) INJECTION 61%
100.0000 mL | Freq: Once | INTRAVENOUS | Status: AC | PRN
  Administered 2018-05-06: 100 mL via INTRAVENOUS

## 2018-05-06 MED ORDER — HYDROMORPHONE HCL 1 MG/ML IJ SOLN
1.0000 mg | Freq: Once | INTRAMUSCULAR | Status: AC
  Administered 2018-05-06: 1 mg via INTRAVENOUS
  Filled 2018-05-06: qty 1

## 2018-05-06 MED ORDER — CIPROFLOXACIN HCL 500 MG PO TABS: 500.0000 mg | ORAL_TABLET | Freq: Two times a day (BID) | ORAL | 0 refills | Status: DC

## 2018-05-06 MED ORDER — ONDANSETRON 4 MG PO TBDP: 4.0000 mg | ORAL_TABLET | Freq: Three times a day (TID) | ORAL | 0 refills | Status: DC | PRN

## 2018-05-06 MED ORDER — ONDANSETRON HCL 4 MG/2ML IJ SOLN
4.0000 mg | Freq: Once | INTRAMUSCULAR | Status: AC
  Administered 2018-05-06: 4 mg via INTRAVENOUS
  Filled 2018-05-06: qty 2

## 2018-05-06 NOTE — ED Provider Notes (Signed)
[facility] EMERGENCY DEPARTMENT Provider Note   CSN: [id number] Arrival date & time: 05/06/18  1209     History   Chief Complaint Chief Complaint  Patient presents with  . Abdominal Pain    HPI Dana Kennedy is a 52 y.o. female.  The history is provided by the patient. No language interpreter was used.  Abdominal Pain   This is a new problem. The current episode started more than 2 days ago. The problem occurs constantly. The problem has been gradually worsening. The pain is located in the RLQ. The quality of the pain is aching. Nothing aggravates the symptoms. Nothing relieves the symptoms.  Pt reports she has had a swollen area in her right lower abdomen.  Pt reports she has had a hernia in the area but she has had recent increased swelling.    Past Medical History:  Diagnosis Date  . Anemia   . Arthritis   . Asthma   . GERD (gastroesophageal reflux disease)   . History of ITP   . Lupus ([place])   . Lupus [facility])     Patient Active Problem List   Diagnosis Date Noted  . 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 Continuous tobacco abuse 05/02/2016  . 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 Status post tonsillectomy 05/02/2016  . Dehydration 10/12/2013  . Idiopathic thrombocytopenic purpura ([place]) 09/05/2012  . Lupus (systemic lupus erythematosus) ([place]) 09/05/2012  . Avascular necrosis of hip ([place]) 06/14/2012    Past Surgical History:  Procedure Laterality Date  . stomach ulcer surgery  2008  . TONSILLECTOMY    . TONSILLECTOMY    . TOTAL HIP ARTHROPLASTY  06/14/2012   Procedure: TOTAL HIP ARTHROPLASTY ANTERIOR APPROACH;  Surgeon: Mcarthur Rossetti, MD;  Location: [facility]S;  Service: Orthopedics;  Laterality: Left;  Left Total Hip Arthroplasty, Anterior Approach C-Arm     OB History   None      Home Medications    Prior to Admission medications   Medication Sig Start Date End Date Taking? Authorizing Provider    albuterol (PROVENTIL HFA;VENTOLIN HFA) 108 (90 Base) MCG/ACT inhaler Inhale 2 puffs into the lungs every 4 (four) hours as needed for wheezing or shortness of breath. 04/14/17   Pollina, Gwenyth Allegra, MD  budesonide-formoterol (SYMBICORT) 160-4.5 MCG/ACT inhaler Inhale 2 puffs into the lungs 2 (two) times daily. Patient not taking: Reported on 03/05/2018 11/12/17   Marshell Garfinkel, MD  chlorpheniramine (CHLOR-TRIMETON) 4 MG tablet Take 2 tablets (8 mg total) by mouth 3 (three) times daily. 12/25/17   Mannam, Hart Robinsons, MD  fluticasone (FLONASE) 50 MCG/ACT nasal spray Place 2 sprays into both nostrils daily. 11/12/17   Marshell Garfinkel, MD  Nicotine 21-14-7 MG/24HR KIT Take as directed 03/05/18   Marshell Garfinkel, MD  omeprazole (PRILOSEC) 20 MG capsule Take 1 capsule (20 mg total) by mouth daily. 11/12/17   Mannam, Hart Robinsons, MD  pantoprazole (PROTONIX) 20 MG tablet Take 1 tablet (20 mg total) by mouth daily. 02/01/18   Lacretia Leigh, MD  sucralfate (CARAFATE) 1 g tablet Take 1 tablet (1 g total) by mouth 4 (four) times daily. 02/01/18   Lacretia Leigh, MD    Family History Family History  Problem Relation Age of Onset  . Cancer Mother   . Hypertension Mother     Social History Social History   Tobacco Use  . Smoking status: Former Smoker    Packs/day: 0.25    Years: 29.00  Pack years: 7.25    Types: Cigarettes    Last attempt to quit: 10/08/2013    Years since quitting: 4.5  . Smokeless tobacco: Never Used  . Tobacco comment: quit smoking 02/19/18-03/05/18  Substance Use Topics  . Alcohol use: Yes    Comment: occasionally  . Drug use: No     Allergies   Allegra [fexofenadine]; Aspirin; Eszopiclone; and Restoril [temazepam]   Review of Systems Review of Systems  Gastrointestinal: Positive for abdominal pain.  All other systems reviewed and are negative.    Physical Exam Updated Vital Signs BP (!) 136/103   Pulse 64   Temp 98 F (36.7 C) (Oral)   Resp (!) 23   Ht _0  (1.626  m)   Wt 96.2 kg   SpO2 95%   BMI 36.39 kg/m   Physical Exam  Constitutional: She is oriented to person, place, and time. She appears well-developed and well-nourished.  HENT:  Head: Normocephalic.  Eyes: EOM are normal.  Neck: Normal range of motion.  Cardiovascular: Normal rate and regular rhythm.  Pulmonary/Chest: Effort normal.  Abdominal: Normal appearance and bowel sounds are normal. She exhibits mass. She exhibits no distension. There is tenderness in the right upper quadrant and right lower quadrant.  Musculoskeletal: Normal range of motion.  Neurological: She is alert and oriented to person, place, and time.  Skin: Skin is warm. Capillary refill takes less than 2 seconds.  Psychiatric: She has a normal mood and affect.  Nursing note and vitals reviewed.    ED Treatments / Results  Labs (all labs ordered are listed, but only abnormal results are displayed) Labs Reviewed  COMPREHENSIVE METABOLIC PANEL - Abnormal; Notable for the following components:      Result Value   Glucose, Bld 101 (*)    Total Protein 6.3 (*)    All other components within normal limits  CBC - Abnormal; Notable for the following components:   MCH 25.8 (*)    RDW 16.1 (*)    All other components within normal limits  LIPASE, BLOOD  URINALYSIS, ROUTINE W REFLEX MICROSCOPIC  I-STAT BETA HCG BLOOD, ED ([facility], [facility], [facility] ONLY)    EKG None  Radiology Dg Abdomen 1 View  Result Date: 05/06/2018 CLINICAL DATA:  Pt in c/o abdominal swelling and pain for the last few months, worse in the last two days, feels like she has a bulging to her RLQ. Hx of hernia. Patient still has appendix and gallbladder. States N/V/D, unsure of fever. EXAM: ABDOMEN - 1 VIEW COMPARISON:  None. FINDINGS: Bowel gas pattern is nonobstructive. No evidence of soft tissue mass or abnormal fluid collection. No evidence of free intraperitoneal air. No evidence of renal or ureteral calculi. No acute or suspicious osseous finding. Mild  levoscoliosis of the lumbar spine. LEFT hip arthroplasty hardware in place. IMPRESSION: No acute findings.  Nonobstructive bowel gas pattern. Electronically Signed   By: Franki Cabot M.D.   On: 05/06/2018 13:37    Procedures Procedures (including critical care time)  Medications Ordered in ED Medications  HYDROmorphone (DILAUDID) injection 1 mg (1 mg Intravenous Given 05/06/18 1419)  ondansetron (ZOFRAN) injection 4 mg (4 mg Intravenous Given 05/06/18 1415)     Initial Impression / Assessment and Plan / ED Course  I have reviewed the triage vital signs and the nursing notes.  Pertinent labs & imaging results that were available during my care of the patient were reviewed by me and considered in my medical decision making (see chart  for details).     MDM  Pt has a large swollen area right lower abdomen.    Final Clinical Impressions(s) / ED Diagnoses   Final diagnoses:  None    ED Discharge Orders    None       Sidney Ace 05/07/18 [id number]    Blanchie Dessert, MD 05/07/18 [phone]

## 2018-05-06 NOTE — Discharge Instructions (Signed)
See your Physicain for recheck in 1 week  You will need to follow up with Gi

## 2018-05-06 NOTE — ED Triage Notes (Signed)
Pt in c/o abdominal swelling and pain for the last few months, worse in the last two days, feels like she has a bulging to her RLQ

## 2018-05-06 NOTE — ED Notes (Signed)
Pt states she understands insytructions and home stable with husband with steqady gait.

## 2018-06-05 ENCOUNTER — Other Ambulatory Visit: Payer: Self-pay | Admitting: Rheumatology

## 2018-06-05 DIAGNOSIS — Z1231 Encounter for screening mammogram for malignant neoplasm of breast: Secondary | ICD-10-CM

## 2018-06-06 ENCOUNTER — Telehealth: Payer: Self-pay | Admitting: Pulmonary Disease

## 2018-06-06 ENCOUNTER — Ambulatory Visit: Payer: Medicaid Other

## 2018-06-06 NOTE — Telephone Encounter (Signed)
Noted  

## 2018-06-06 NOTE — Telephone Encounter (Signed)
Patient scheduled with PM for 06/07/18 at 12 noon. No call back is needed.

## 2018-06-06 NOTE — Telephone Encounter (Signed)
Received surgical clearance request from [facility].  Per Dr. Vaughan Browner please schedule pt for OV with either himself or NP tomorrow.   lmtcb x1 for pt.

## 2018-06-07 ENCOUNTER — Ambulatory Visit: Payer: Medicaid Other | Admitting: Pulmonary Disease

## 2018-06-10 ENCOUNTER — Ambulatory Visit (INDEPENDENT_AMBULATORY_CARE_PROVIDER_SITE_OTHER): Payer: Medicaid Other | Admitting: Pulmonary Disease

## 2018-06-10 ENCOUNTER — Encounter: Payer: Self-pay | Admitting: Pulmonary Disease

## 2018-06-10 VITALS — BP 136/86 | HR 100 | Ht 64.0 in | Wt 212.0 lb

## 2018-06-10 DIAGNOSIS — J45909 Unspecified asthma, uncomplicated: Secondary | ICD-10-CM | POA: Diagnosis not present

## 2018-06-10 MED ORDER — FLUTICASONE FUROATE-VILANTEROL 200-25 MCG/INH IN AEPB: 1.0000 | INHALATION_SPRAY | Freq: Every day | RESPIRATORY_TRACT | 0 refills | Status: AC

## 2018-06-10 MED ORDER — FLUTICASONE FUROATE-VILANTEROL 200-25 MCG/INH IN AEPB: 1.0000 | INHALATION_SPRAY | Freq: Every day | RESPIRATORY_TRACT | 5 refills | Status: DC

## 2018-06-10 NOTE — Progress Notes (Signed)
Dana Kennedy    [id number]    1966/04/02  Primary Care Physician:Patient, No Pcp Per  Referring Physician: No referring provider defined for this encounter.  Chief complaint:  Follow-up for  Cough, asthma. Needs preop clearance for abdominal hernia repair.  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  Being followed by Dr. Lebron Conners, hematology.  Referred for evaluation of chronic cough.  She has these complaints for the past 2 years  She has albuterol inhaler which is used several times a day.  C/o cough with dark, black mucus production, dyspnea with activity and at rest, occasional wheezing. Reports seasonal allergies, postnasal drip and acid reflux. Seen in the ED for 10/31/17 with chest pain, cough. CT scan did not show pulmonary embolism but there was scattered groundglass opacity and she has been treated with antibiotics and prednisone.  She reports that this improved her symptoms.  History noted for Evans syndrome, lupus.  She was followed by Dr. Charlestine Night, Rheumatology and treated with Plaquenil in past.  However she has no treatment for at least 5 years.  She was seen at [facility] in 2016 at the rheumatology clinic but did not follow-up.  Pets: No pets Occupation: Used to work at the hospital joint.  Currently disabled Exposures: Reports exposure to mold at previous home for several years.  She moved out in October 2018  Has carpet in the bedroom, tile floor elsewhere. Forced air heating.  She changes the air filters regularly. Smoking history: 29-pack-year smoking.  Continues to smoke 5 cigarettes a day Travel History: Grew up in [place].  Moved to [place] in 1990.  No other significant travel  Interim history: Given Breo sample at last visit.  She states that this works better for her than the Symbicort She hardly needs to use her rescue inhaler. Scheduled for hernia repair and is here for preop clearance Quit smoking 1 month  ago  Outpatient Encounter Medications as of 06/10/2018  Medication Sig  . albuterol (PROVENTIL HFA;VENTOLIN HFA) 108 (90 Base) MCG/ACT inhaler Inhale 2 puffs into the lungs every 4 (four) hours as needed for wheezing or shortness of breath.  . budesonide-formoterol (SYMBICORT) 160-4.5 MCG/ACT inhaler Inhale 2 puffs into the lungs 2 (two) times daily.  . chlorpheniramine (CHLOR-TRIMETON) 4 MG tablet Take 2 tablets (8 mg total) by mouth 3 (three) times daily.  . fluticasone (FLONASE) 50 MCG/ACT nasal spray Place 2 sprays into both nostrils daily.  . metroNIDAZOLE (FLAGYL) 500 MG tablet Take 1 tablet (500 mg total) by mouth 2 (two) times daily.  . Nicotine 21-14-7 MG/24HR KIT Take as directed  . olopatadine (PATANOL) 0.1 % ophthalmic solution Place 1 drop into both eyes 2 (two) times daily.  Marland Kitchen omeprazole (PRILOSEC) 20 MG capsule Take 1 capsule (20 mg total) by mouth daily.  . ondansetron (ZOFRAN ODT) 4 MG disintegrating tablet Take 1 tablet (4 mg total) by mouth every 8 (eight) hours as needed for nausea or vomiting.  Marland Kitchen oxyCODONE-acetaminophen (PERCOCET/ROXICET) 5-325 MG tablet Take 1-2 tablets by mouth every 8 (eight) hours as needed for severe pain.  . pantoprazole (PROTONIX) 20 MG tablet Take 1 tablet (20 mg total) by mouth daily.  . sucralfate (CARAFATE) 1 g tablet Take 1 tablet (1 g total) by mouth 4 (four) times daily.  . [DISCONTINUED] ciprofloxacin (CIPRO) 500 MG tablet Take 1 tablet (500 mg total) by mouth 2 (two) times daily.  . [  DISCONTINUED] HYDROcodone-acetaminophen (NORCO/VICODIN) 5-325 MG tablet Take 1 tablet by mouth every 4 (four) hours as needed.   No facility-administered encounter medications on file as of 06/10/2018.    Physical Exam: Blood pressure 136/86, pulse 100, height '5\' 4"'  (1.626 m), weight 96.2 kg, SpO2 94 %. Gen:      No acute distress HEENT:  EOMI, sclera anicteric Neck:     No masses; no thyromegaly Lungs:    Clear to auscultation bilaterally; normal respiratory  effort CV:         Regular rate and rhythm; no murmurs Abd:      + bowel sounds; soft, non-tender; no palpable masses, no distension Ext:    No edema; adequate peripheral perfusion Skin:      Warm and dry; no rash Neuro: alert and oriented x 3 Psych: normal mood and affect  Data Reviewed: Serologies 09/02/10-  Positive ANA 1:2560, speckled, Ro 63, RNP 48 CCP less than 2, double-stranded DNA 15, rheumatoid factor 20, LA [place] 10/21/14 Positive ANA, 1;160, diffuse pattern  CBC 11/12/17-WBC 6.2, eos 5%, absolute eosinophil count 300 Blood allergy profile 11/12/17-IgE 4, mild sensitivity to ragweed. Hypersensitivity panel, mold antibody panel 11/12/17-negative Sputum culture 11/12/17-negative  CT angiogram PE protocol-11/01/17 Lower lobe bronchial thickening, scattered groundglass opacities, air-trapping.  Left lung calcified nodule and mediastinal nodules which is stable since 2011.  Chest x-ray 12/25/2017-mild hyperinflation.  No acute abnormality I reviewed the images personally.   FENO 11/12/17-31 FENO 12/25/17-13  PFTs 12/25/17 FVC 2.05 [68%], FEV1 1.71 [71%], F/F 83 FEF 25-75% 1.7 [68%], post bronchodilator 2.15 [86%], +26% TLC 59%, DLCO/330% Small airways disease with improvement in flow rates post bronchodilator, restriction with increased diffusion capacity  Assessment:  Chronic cough, moderate persistent asthma Continue Breo, albuterol as needed  Preop clearance for hernia surgery Asthma is well controlled.  No contraindications to surgery. Suggest early mobilization, use of incentive spirometer   History of lupus Referred back to rheumatology but primary care  Smoker Stop smoking last month  Plan/Recommendations: - Continue Breo, albuterol as needed - Cleared for hernia repair surgery.  Marshell Garfinkel MD [facility] 06/10/2018, 3:36 PM  CC: No ref. provider found

## 2018-06-10 NOTE — Patient Instructions (Signed)
I am glad your breathing is better We will give you a sample of Breo 200 and send in a prescription Continue albuterol as needed Congratulations on quitting smoking. Follow-up in 6 months.

## 2018-06-10 NOTE — Addendum Note (Signed)
Addended by: Maryanna Shape A on: 06/10/2018 03:55 PM   Modules accepted: Orders

## 2018-08-01 ENCOUNTER — Other Ambulatory Visit: Payer: Self-pay | Admitting: *Deleted

## 2018-08-01 DIAGNOSIS — D693 Immune thrombocytopenic purpura: Secondary | ICD-10-CM

## 2018-08-05 ENCOUNTER — Inpatient Hospital Stay: Payer: Medicaid Other | Admitting: Hematology

## 2018-08-05 ENCOUNTER — Inpatient Hospital Stay: Payer: Medicaid Other

## 2018-08-12 ENCOUNTER — Telehealth: Payer: Self-pay | Admitting: Hematology

## 2018-08-12 NOTE — Telephone Encounter (Signed)
Patient called to reschedule  °

## 2018-08-16 ENCOUNTER — Emergency Department (HOSPITAL_COMMUNITY): Payer: Medicaid Other

## 2018-08-16 ENCOUNTER — Encounter (HOSPITAL_COMMUNITY): Payer: Self-pay | Admitting: Emergency Medicine

## 2018-08-16 ENCOUNTER — Emergency Department (HOSPITAL_COMMUNITY)
Admission: EM | Admit: 2018-08-16 | Discharge: 2018-08-16 | Disposition: A | Discharge status: Home / Self Care | Payer: Medicaid Other | Attending: Emergency Medicine | Admitting: Emergency Medicine

## 2018-08-16 ENCOUNTER — Other Ambulatory Visit: Payer: Self-pay

## 2018-08-16 DIAGNOSIS — Z79899 Other long term (current) drug therapy: Secondary | ICD-10-CM | POA: Insufficient documentation

## 2018-08-16 DIAGNOSIS — K432 Incisional hernia without obstruction or gangrene: Secondary | ICD-10-CM

## 2018-08-16 DIAGNOSIS — K5732 Diverticulitis of large intestine without perforation or abscess without bleeding: Secondary | ICD-10-CM | POA: Insufficient documentation

## 2018-08-16 DIAGNOSIS — Z87891 Personal history of nicotine dependence: Secondary | ICD-10-CM | POA: Diagnosis not present

## 2018-08-16 DIAGNOSIS — J45909 Unspecified asthma, uncomplicated: Secondary | ICD-10-CM | POA: Insufficient documentation

## 2018-08-16 DIAGNOSIS — K5792 Diverticulitis of intestine, part unspecified, without perforation or abscess without bleeding: Secondary | ICD-10-CM

## 2018-08-16 DIAGNOSIS — K439 Ventral hernia without obstruction or gangrene: Secondary | ICD-10-CM | POA: Diagnosis not present

## 2018-08-16 DIAGNOSIS — R1033 Periumbilical pain: Secondary | ICD-10-CM | POA: Diagnosis present

## 2018-08-16 LAB — COMPREHENSIVE METABOLIC PANEL
ALT: 13 U/L (ref 0–44)
AST: 15 U/L (ref 15–41)
Albumin: 3.4 g/dL — ABNORMAL LOW (ref 3.5–5.0)
Alkaline Phosphatase: 68 U/L (ref 38–126)
Anion gap: 6 (ref 5–15)
BILIRUBIN TOTAL: 0.3 mg/dL (ref 0.3–1.2)
BUN: 10 mg/dL (ref 6–20)
CO2: 22 mmol/L (ref 22–32)
Calcium: 9.1 mg/dL (ref 8.9–10.3)
Chloride: 110 mmol/L (ref 98–111)
Creatinine, Ser: 0.88 mg/dL (ref 0.44–1.00)
GFR calc Af Amer: >60 mL/min (ref >60)
GFR calc non Af Amer: >60 mL/min (ref >60)
Glucose, Bld: 118 mg/dL — ABNORMAL HIGH (ref 70–99)
Potassium: 3.8 mmol/L (ref 3.5–5.1)
Sodium: 138 mmol/L (ref 135–145)
TOTAL PROTEIN: 6.3 g/dL — AB (ref 6.5–8.1)

## 2018-08-16 LAB — URINALYSIS, ROUTINE W REFLEX MICROSCOPIC
BILIRUBIN URINE: NEGATIVE
Glucose, UA: NEGATIVE mg/dL
KETONES UR: NEGATIVE mg/dL
Nitrite: NEGATIVE
PH: 5 (ref 5.0–8.0)
Protein, ur: NEGATIVE mg/dL
Specific Gravity, Urine: 1.028 (ref 1.005–1.030)

## 2018-08-16 LAB — CBC WITH DIFFERENTIAL/PLATELET
Abs Immature Granulocytes: 0.04 10*3/uL (ref 0.00–0.07)
Basophils Absolute: 0 10*3/uL (ref 0.0–0.1)
Basophils Relative: 0 %
EOS ABS: 0.4 10*3/uL (ref 0.0–0.5)
Eosinophils Relative: 4 %
HEMATOCRIT: 39.7 % (ref 36.0–46.0)
Hemoglobin: 11.9 g/dL — ABNORMAL LOW (ref 12.0–15.0)
Immature Granulocytes: 0 %
Lymphocytes Relative: 20 %
Lymphs Abs: 2.1 10*3/uL (ref 0.7–4.0)
MCH: 25.1 pg — ABNORMAL LOW (ref 26.0–34.0)
MCHC: 30 g/dL (ref 30.0–36.0)
MCV: 83.6 fL (ref 80.0–100.0)
Monocytes Absolute: 0.8 10*3/uL (ref 0.1–1.0)
Monocytes Relative: 8 %
Neutro Abs: 7.1 10*3/uL (ref 1.7–7.7)
Neutrophils Relative %: 68 %
Platelets: 377 10*3/uL (ref 150–400)
RBC: 4.75 MIL/uL (ref 3.87–5.11)
RDW: 15 % (ref 11.5–15.5)
WBC: 10.5 10*3/uL (ref 4.0–10.5)
nRBC: 0 % (ref 0.0–0.2)

## 2018-08-16 LAB — I-STAT BETA HCG BLOOD, ED (MC, WL, AP ONLY): I-stat hCG, quantitative: <5 m[IU]/mL (ref <5)

## 2018-08-16 LAB — I-STAT CG4 LACTIC ACID, ED
Lactic Acid, Venous: 1.25 mmol/L (ref 0.5–1.9)
Lactic Acid, Venous: 0.87 mmol/L (ref 0.5–1.9)

## 2018-08-16 MED ORDER — CIPROFLOXACIN HCL 500 MG PO TABS: 500.0000 mg | ORAL_TABLET | Freq: Two times a day (BID) | ORAL | 0 refills | Status: DC

## 2018-08-16 MED ORDER — MORPHINE SULFATE (PF) 4 MG/ML IV SOLN
4.0000 mg | Freq: Once | INTRAVENOUS | Status: AC
  Administered 2018-08-16: 4 mg via INTRAVENOUS
  Filled 2018-08-16: qty 1

## 2018-08-16 MED ORDER — METRONIDAZOLE 500 MG PO TABS: 500.0000 mg | ORAL_TABLET | Freq: Two times a day (BID) | ORAL | 0 refills | Status: DC

## 2018-08-16 MED ORDER — OXYCODONE-ACETAMINOPHEN 5-325 MG PO TABS: 1.0000 | ORAL_TABLET | ORAL | 0 refills | Status: DC | PRN

## 2018-08-16 MED ORDER — OXYCODONE-ACETAMINOPHEN 5-325 MG PO TABS
1.0000 | ORAL_TABLET | Freq: Once | ORAL | Status: AC
  Administered 2018-08-16: 1 via ORAL
  Filled 2018-08-16: qty 1

## 2018-08-16 MED ORDER — CIPROFLOXACIN IN D5W 400 MG/200ML IV SOLN
400.0000 mg | Freq: Once | INTRAVENOUS | Status: AC
  Administered 2018-08-16: 400 mg via INTRAVENOUS
  Filled 2018-08-16: qty 200

## 2018-08-16 MED ORDER — METRONIDAZOLE IN NACL 5-0.79 MG/ML-% IV SOLN
500.0000 mg | Freq: Once | INTRAVENOUS | Status: AC
  Administered 2018-08-16: 500 mg via INTRAVENOUS
  Filled 2018-08-16: qty 100

## 2018-08-16 MED ORDER — ONDANSETRON HCL 4 MG/2ML IJ SOLN
4.0000 mg | Freq: Once | INTRAMUSCULAR | Status: AC
  Administered 2018-08-16: 4 mg via INTRAVENOUS
  Filled 2018-08-16: qty 2

## 2018-08-16 MED ORDER — IOHEXOL 300 MG/ML  SOLN
100.0000 mL | Freq: Once | INTRAMUSCULAR | Status: AC | PRN
  Administered 2018-08-16: 100 mL via INTRAVENOUS

## 2018-08-16 NOTE — ED Notes (Signed)
Patient transported to CT 

## 2018-08-16 NOTE — ED Provider Notes (Signed)
[facility] DEPARTMENT Provider Note   CSN: [id number] Arrival date & time: 08/16/18  1724     History   Chief Complaint Chief Complaint  Patient presents with  . Hernia    HPI Dana Kennedy is a 52 y.o. female with a history of ITP, lupus, GERD and asthma along with a known umbilical hernia which she is awaiting surgical repair with [place] [facility] presenting with increased pain in her abdomen, stating periumbilical but now with radiation into her left lower quadrant.  Her pain is constant and started gradually earlier today.  She noticed her pain such as sitting from a supine position and with walking.  She has had mild nausea without vomiting, also denies fevers or chills.  She denies chest pain or shortness of breath, also no dysuria and no bowel changes such as diarrhea or constipation.  She has found no alleviators for her symptoms.   The history is provided by the patient.    Past Medical History:  Diagnosis Date  . Anemia   . Arthritis   . Asthma   . GERD (gastroesophageal reflux disease)   . History of ITP   . Lupus ([place])   . Lupus [facility])     Patient Active Problem List   Diagnosis Date Noted  . 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 Continuous tobacco abuse 05/02/2016  . 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 Status post tonsillectomy 05/02/2016  . Dehydration 10/12/2013  . Idiopathic thrombocytopenic purpura ([place]) 09/05/2012  . Lupus (systemic lupus erythematosus) ([place]) 09/05/2012  . Avascular necrosis of hip ([place]) 06/14/2012    Past Surgical History:  Procedure Laterality Date  . stomach ulcer surgery  2008  . TONSILLECTOMY    . TONSILLECTOMY    . TOTAL HIP ARTHROPLASTY  06/14/2012   Procedure: TOTAL HIP ARTHROPLASTY ANTERIOR APPROACH;  Surgeon: Mcarthur Rossetti, MD;  Location: [facility];  Service: Orthopedics;  Laterality: Left;  Left Total Hip Arthroplasty, Anterior Approach C-Arm       OB History   None      Home Medications    Prior to Admission medications   Medication Sig Start Date End Date Taking? Authorizing Provider  albuterol (PROVENTIL HFA;VENTOLIN HFA) 108 (90 Base) MCG/ACT inhaler Inhale 2 puffs into the lungs every 4 (four) hours as needed for wheezing or shortness of breath. 04/14/17   Pollina, Gwenyth Allegra, MD  budesonide-formoterol (SYMBICORT) 160-4.5 MCG/ACT inhaler Inhale 2 puffs into the lungs 2 (two) times daily. 11/12/17   Mannam, Hart Robinsons, MD  chlorpheniramine (CHLOR-TRIMETON) 4 MG tablet Take 2 tablets (8 mg total) by mouth 3 (three) times daily. 12/25/17   Mannam, Hart Robinsons, MD  ciprofloxacin (CIPRO) 500 MG tablet Take 1 tablet (500 mg total) by mouth every 12 (twelve) hours. 08/16/18   Evalee Jefferson, PA-C  fluticasone (FLONASE) 50 MCG/ACT nasal spray Place 2 sprays into both nostrils daily. 11/12/17   Mannam, Hart Robinsons, MD  fluticasone furoate-vilanterol (BREO ELLIPTA) 200-25 MCG/INH AEPB Inhale 1 puff into the lungs daily. 06/10/18   Mannam, Hart Robinsons, MD  metroNIDAZOLE (FLAGYL) 500 MG tablet Take 1 tablet (500 mg total) by mouth 2 (two) times daily for 10 days. 08/16/18 08/26/18  Evalee Jefferson, PA-C  Nicotine 21-14-7 MG/24HR KIT Take as directed 03/05/18   Marshell Garfinkel, MD  olopatadine (PATANOL) 0.1 % ophthalmic solution Place 1 drop into both eyes 2 (two) times daily.    [provider]  omeprazole (PRILOSEC) 20 MG capsule Take  1 capsule (20 mg total) by mouth daily. 11/12/17   Mannam, Hart Robinsons, MD  ondansetron (ZOFRAN ODT) 4 MG disintegrating tablet Take 1 tablet (4 mg total) by mouth every 8 (eight) hours as needed for nausea or vomiting. 05/06/18   Fransico Meadow, PA-C  oxyCODONE-acetaminophen (PERCOCET/ROXICET) 5-325 MG tablet Take 1 tablet by mouth every 4 (four) hours as needed for severe pain. 08/16/18   Evalee Jefferson, PA-C  pantoprazole (PROTONIX) 20 MG tablet Take 1 tablet (20 mg total) by mouth daily. 02/01/18   Lacretia Leigh, MD  sucralfate  (CARAFATE) 1 g tablet Take 1 tablet (1 g total) by mouth 4 (four) times daily. 02/01/18   Lacretia Leigh, MD    Family History Family History  Problem Relation Age of Onset  . Cancer Mother   . Hypertension Mother     Social History Social History   Tobacco Use  . Smoking status: Former Smoker    Packs/day: 0.25    Years: 29.00    Pack years: 7.25    Types: Cigarettes    Last attempt to quit: 05/08/2018    Years since quitting: 0.2  . Smokeless tobacco: Never Used  Substance Use Topics  . Alcohol use: Yes    Comment: occasionally  . Drug use: No     Allergies   Allegra [fexofenadine]; Aspirin; Eszopiclone; and Restoril [temazepam]   Review of Systems Review of Systems  Constitutional: Negative for chills and fever.  HENT: Negative for congestion and sore throat.   Eyes: Negative.   Respiratory: Negative for chest tightness and shortness of breath.   Cardiovascular: Negative for chest pain.  Gastrointestinal: Positive for abdominal pain and nausea. Negative for vomiting.  Genitourinary: Negative.   Musculoskeletal: Negative for arthralgias, joint swelling and neck pain.  Skin: Negative.  Negative for rash and wound.  Neurological: Negative for dizziness, weakness, light-headedness, numbness and headaches.  Psychiatric/Behavioral: Negative.      Physical Exam Updated Vital Signs BP 134/64 (BP Location: Left Arm)   Pulse 85   Temp 98 F (36.7 C) (Oral)   Resp 18   Ht '5\' 4"'  (1.626 m)   Wt 97.1 kg   SpO2 99%   BMI 36.73 kg/m    Physical Exam  Constitutional: She appears well-developed and well-nourished.  HENT:  Head: Normocephalic and atraumatic.  Eyes: Conjunctivae are normal.  Neck: Normal range of motion.  Cardiovascular: Normal rate, regular rhythm, normal heart sounds and intact distal pulses.  Pulmonary/Chest: Effort normal and breath sounds normal. She has no wheezes.  Abdominal: Soft. Bowel sounds are normal. She exhibits mass. She exhibits no  abdominal bruit and no pulsatile midline mass. There is no hepatosplenomegaly. There is tenderness in the periumbilical area and left lower quadrant. There is no rigidity, no rebound and no guarding.  Well-healed midline supra umbilical surgical incision.  There is a umbilical hernia which is soft but tender to palpation.  There is no appreciable bowel sounds of this hernia.  She has tenderness to palpation in her left lower abdomen without guarding.  Musculoskeletal: Normal range of motion.  Neurological: She is alert.  Skin: Skin is warm and dry.  Psychiatric: She has a normal mood and affect.  Nursing note and vitals reviewed.    ED Treatments / Results  Labs (all labs ordered are listed, but only abnormal results are displayed) Labs Reviewed  CBC WITH DIFFERENTIAL/PLATELET - Abnormal; Notable for the following components:      Result Value   Hemoglobin 11.9 (*)  MCH 25.1 (*)    All other components within normal limits  COMPREHENSIVE METABOLIC PANEL - Abnormal; Notable for the following components:   Glucose, Bld 118 (*)    Total Protein 6.3 (*)    Albumin 3.4 (*)    All other components within normal limits  URINALYSIS, ROUTINE W REFLEX MICROSCOPIC - Abnormal; Notable for the following components:   APPearance HAZY (*)    Hgb urine dipstick MODERATE (*)    Leukocytes, UA SMALL (*)    Bacteria, UA RARE (*)    All other components within normal limits  I-STAT CG4 LACTIC ACID, ED  I-STAT BETA HCG BLOOD, ED ([facility], [facility], [facility] ONLY)  I-STAT CG4 LACTIC ACID, ED    EKG None  Radiology Ct Abdomen Pelvis W Contrast  Result Date: 08/16/2018 CLINICAL DATA:  Lower abdominal pain and no midline hernia. Some low back pain as hernia repair 2014 but states hernia has recurred. Pain worse over the past 3 days. EXAM: CT ABDOMEN AND PELVIS WITH CONTRAST TECHNIQUE: Multidetector CT imaging of the abdomen and pelvis was performed using the standard protocol following bolus administration of  intravenous contrast. CONTRAST:  136m OMNIPAQUE IOHEXOL 300 MG/ML  SOLN COMPARISON:  05/06/2018 and 02/01/2018 FINDINGS: Lower chest: Calcified granuloma over the left lower lobe. Mild cardiomegaly. Hepatobiliary: Gallbladder, liver and biliary tree are normal. Pancreas: Normal. Spleen: Normal. Adrenals/Urinary Tract: Adrenal glands are normal. Kidneys are normal in size without hydronephrosis or nephrolithiasis. Ureters and bladder are normal. Stomach/Bowel: Stomach and small bowel are normal. Appendix is within normal. Diverticulosis of the colon. There is inflammatory change adjacent a short segment of the thick-walled sigmoid colon in the left lower quadrant compatible with acute diverticulitis. There is no perforation or abscess. Vascular/Lymphatic: Normal. Reproductive: Within normal. Other: Periumbilical hernia containing peritoneal fat unchanged. Tiny amount of free pelvic fluid. Musculoskeletal: Left hip arthroplasty intact. Mild degenerate change of the right hip. IMPRESSION: Colonic diverticulosis with mild acute diverticulitis involving a short segment of the sigmoid colon in the left lower quadrant. No perforation or abscess. Known umbilical hernia containing peritoneal fat unchanged. Electronically Signed   By: DMarin OlpM.D.   On: 08/16/2018 20:11    Procedures Procedures (including critical care time)  Medications Ordered in ED Medications  metroNIDAZOLE (FLAGYL) IVPB 500 mg (has no administration in time range)  morphine 4 MG/ML injection 4 mg (4 mg Intravenous Given 08/16/18 1832)  ondansetron (ZOFRAN) injection 4 mg (4 mg Intravenous Given 08/16/18 1832)  iohexol (OMNIPAQUE) 300 MG/ML solution 100 mL (100 mLs Intravenous Contrast Given 08/16/18 1949)  morphine 4 MG/ML injection 4 mg (4 mg Intravenous Given 08/16/18 2028)  ciprofloxacin (CIPRO) IVPB 400 mg (400 mg Intravenous New Bag/Given 08/16/18 2057)  oxyCODONE-acetaminophen (PERCOCET/ROXICET) 5-325 MG per tablet 1 tablet (1  tablet Oral Given 08/16/18 2122)     Initial Impression / Assessment and Plan / ED Course  I have reviewed the triage vital signs and the nursing notes.  Pertinent labs & imaging results that were available during my care of the patient were reviewed by me and considered in my medical decision making (see chart for details).     Labs and CT imaging was reviewed and discussed with patient.  She was given a dose of Cipro and Flagyl per IV here.  She is also given oxycodone.  Patient is motivated to be disposed to home with outpatient follow-up.  Plan discharge once her antibiotics have been given.  Strict return precautions were discussed.  Advised patient  to follow-up with her PCP for recheck early this next week.  She is a patient at [facility] in [place].  Final Clinical Impressions(s) / ED Diagnoses   Final diagnoses:  Ventral hernia, recurrent  Diverticulitis    ED Discharge Orders         Ordered    oxyCODONE-acetaminophen (PERCOCET/ROXICET) 5-325 MG tablet  Every 4 hours PRN     08/16/18 2218    ciprofloxacin (CIPRO) 500 MG tablet  Every 12 hours     08/16/18 2218    metroNIDAZOLE (FLAGYL) 500 MG tablet  2 times daily     08/16/18 2218          Evalee Jefferson, PA-C 08/16/18 2232  Sherwood Gambler, MD 08/16/18 2329

## 2018-08-16 NOTE — Discharge Instructions (Addendum)
Take the entire course of the antibiotics prescribed.  You may also use the oxycodone for pain relief.  Do not drive within 4 hours of taking this medication.  You should maintain a clear liquid diet for the next 24 hours.  If your symptoms are improving you may increase your diet as tolerated after the next 24 hours.  Return here immediately for any fevers, uncontrolled vomiting or worse pain as these would be reasons to be admitted to the hospital.

## 2018-08-16 NOTE — ED Triage Notes (Signed)
Per pt she came here 1 month ago for evaluation of her lower abdominal hernia.  She come back today due to increased pain and states it has gotten worse.  NAD noted at this time states pain 10/10 AOx4

## 2018-08-18 ENCOUNTER — Encounter: Payer: Self-pay | Admitting: Emergency Medicine

## 2018-08-18 ENCOUNTER — Other Ambulatory Visit: Payer: Self-pay

## 2018-08-18 ENCOUNTER — Inpatient Hospital Stay (HOSPITAL_COMMUNITY)
Admission: EM | Admit: 2018-08-18 | Discharge: 2018-08-26 | DRG: 392 | Disposition: A | Discharge status: Home / Self Care | Payer: Medicaid Other | Attending: Family Medicine | Admitting: Family Medicine

## 2018-08-18 ENCOUNTER — Emergency Department (HOSPITAL_COMMUNITY): Payer: Medicaid Other

## 2018-08-18 DIAGNOSIS — R1032 Left lower quadrant pain: Secondary | ICD-10-CM | POA: Diagnosis not present

## 2018-08-18 DIAGNOSIS — K219 Gastro-esophageal reflux disease without esophagitis: Secondary | ICD-10-CM | POA: Diagnosis present

## 2018-08-18 DIAGNOSIS — M069 Rheumatoid arthritis, unspecified: Secondary | ICD-10-CM | POA: Diagnosis present

## 2018-08-18 DIAGNOSIS — J45909 Unspecified asthma, uncomplicated: Secondary | ICD-10-CM | POA: Diagnosis present

## 2018-08-18 DIAGNOSIS — Z888 Allergy status to other drugs, medicaments and biological substances status: Secondary | ICD-10-CM

## 2018-08-18 DIAGNOSIS — Z87891 Personal history of nicotine dependence: Secondary | ICD-10-CM | POA: Diagnosis not present

## 2018-08-18 DIAGNOSIS — Z8249 Family history of ischemic heart disease and other diseases of the circulatory system: Secondary | ICD-10-CM | POA: Diagnosis not present

## 2018-08-18 DIAGNOSIS — N950 Postmenopausal bleeding: Secondary | ICD-10-CM | POA: Diagnosis present

## 2018-08-18 DIAGNOSIS — Z9851 Tubal ligation status: Secondary | ICD-10-CM | POA: Diagnosis not present

## 2018-08-18 DIAGNOSIS — N939 Abnormal uterine and vaginal bleeding, unspecified: Secondary | ICD-10-CM | POA: Diagnosis not present

## 2018-08-18 DIAGNOSIS — M329 Systemic lupus erythematosus, unspecified: Secondary | ICD-10-CM | POA: Diagnosis present

## 2018-08-18 DIAGNOSIS — Z8711 Personal history of peptic ulcer disease: Secondary | ICD-10-CM | POA: Diagnosis not present

## 2018-08-18 DIAGNOSIS — K5792 Diverticulitis of intestine, part unspecified, without perforation or abscess without bleeding: Secondary | ICD-10-CM | POA: Diagnosis present

## 2018-08-18 DIAGNOSIS — K429 Umbilical hernia without obstruction or gangrene: Secondary | ICD-10-CM | POA: Diagnosis present

## 2018-08-18 DIAGNOSIS — G8929 Other chronic pain: Secondary | ICD-10-CM | POA: Diagnosis present

## 2018-08-18 DIAGNOSIS — Z79891 Long term (current) use of opiate analgesic: Secondary | ICD-10-CM

## 2018-08-18 DIAGNOSIS — J4520 Mild intermittent asthma, uncomplicated: Secondary | ICD-10-CM | POA: Diagnosis not present

## 2018-08-18 DIAGNOSIS — K5732 Diverticulitis of large intestine without perforation or abscess without bleeding: Principal | ICD-10-CM | POA: Diagnosis present

## 2018-08-18 DIAGNOSIS — M47816 Spondylosis without myelopathy or radiculopathy, lumbar region: Secondary | ICD-10-CM | POA: Diagnosis present

## 2018-08-18 DIAGNOSIS — Z886 Allergy status to analgesic agent status: Secondary | ICD-10-CM

## 2018-08-18 DIAGNOSIS — Z7951 Long term (current) use of inhaled steroids: Secondary | ICD-10-CM

## 2018-08-18 DIAGNOSIS — Z79899 Other long term (current) drug therapy: Secondary | ICD-10-CM

## 2018-08-18 DIAGNOSIS — Z862 Personal history of diseases of the blood and blood-forming organs and certain disorders involving the immune mechanism: Secondary | ICD-10-CM | POA: Diagnosis not present

## 2018-08-18 DIAGNOSIS — Z96642 Presence of left artificial hip joint: Secondary | ICD-10-CM | POA: Diagnosis present

## 2018-08-18 HISTORY — DX: Rheumatoid arthritis, unspecified: M06.9

## 2018-08-18 HISTORY — DX: Systemic lupus erythematosus, unspecified: M32.9

## 2018-08-18 HISTORY — DX: Low back pain: M54.5

## 2018-08-18 HISTORY — DX: Other chronic pain: G89.29

## 2018-08-18 HISTORY — DX: Personal history of other medical treatment: Z92.89

## 2018-08-18 HISTORY — DX: Low back pain, unspecified: M54.50

## 2018-08-18 LAB — CBC WITH DIFFERENTIAL/PLATELET
Abs Immature Granulocytes: 0.05 10*3/uL (ref 0.00–0.07)
Basophils Absolute: 0 10*3/uL (ref 0.0–0.1)
Basophils Relative: 0 %
Eosinophils Absolute: 0.3 10*3/uL (ref 0.0–0.5)
Eosinophils Relative: 2 %
HCT: 39.5 % (ref 36.0–46.0)
HEMOGLOBIN: 12 g/dL (ref 12.0–15.0)
Immature Granulocytes: 0 %
Lymphocytes Relative: 24 %
Lymphs Abs: 2.9 10*3/uL (ref 0.7–4.0)
MCH: 25 pg — ABNORMAL LOW (ref 26.0–34.0)
MCHC: 30.4 g/dL (ref 30.0–36.0)
MCV: 82.3 fL (ref 80.0–100.0)
Monocytes Absolute: 1.1 10*3/uL — ABNORMAL HIGH (ref 0.1–1.0)
Monocytes Relative: 9 %
Neutro Abs: 7.7 10*3/uL (ref 1.7–7.7)
Neutrophils Relative %: 65 %
Platelets: 462 10*3/uL — ABNORMAL HIGH (ref 150–400)
RBC: 4.8 MIL/uL (ref 3.87–5.11)
RDW: 14.6 % (ref 11.5–15.5)
WBC: 11.9 10*3/uL — ABNORMAL HIGH (ref 4.0–10.5)
nRBC: 0 % (ref 0.0–0.2)

## 2018-08-18 LAB — I-STAT BETA HCG BLOOD, ED (MC, WL, AP ONLY): I-stat hCG, quantitative: <5 m[IU]/mL (ref <5)

## 2018-08-18 LAB — BASIC METABOLIC PANEL
Anion gap: 9 (ref 5–15)
BUN: 6 mg/dL (ref 6–20)
CHLORIDE: 103 mmol/L (ref 98–111)
CO2: 25 mmol/L (ref 22–32)
Calcium: 9 mg/dL (ref 8.9–10.3)
Creatinine, Ser: 0.69 mg/dL (ref 0.44–1.00)
GFR calc Af Amer: >60 mL/min (ref >60)
GFR calc non Af Amer: >60 mL/min (ref >60)
Glucose, Bld: 97 mg/dL (ref 70–99)
POTASSIUM: 3.9 mmol/L (ref 3.5–5.1)
Sodium: 137 mmol/L (ref 135–145)

## 2018-08-18 LAB — WET PREP, GENITAL
Clue Cells Wet Prep HPF POC: NONE SEEN
Sperm: NONE SEEN
TRICH WET PREP: NONE SEEN
Yeast Wet Prep HPF POC: NONE SEEN

## 2018-08-18 MED ORDER — METRONIDAZOLE IN NACL 5-0.79 MG/ML-% IV SOLN
500.0000 mg | Freq: Once | INTRAVENOUS | Status: AC
  Administered 2018-08-18: 500 mg via INTRAVENOUS
  Filled 2018-08-18: qty 100

## 2018-08-18 MED ORDER — ENOXAPARIN SODIUM 40 MG/0.4ML ~~LOC~~ SOLN
40.0000 mg | SUBCUTANEOUS | Status: DC
  Administered 2018-08-18 – 2018-08-20 (×3): 40 mg via SUBCUTANEOUS
  Filled 2018-08-18 (×3): qty 0.4

## 2018-08-18 MED ORDER — ONDANSETRON HCL 4 MG PO TABS: 4.0000 mg | ORAL_TABLET | Freq: Four times a day (QID) | ORAL | Status: DC | PRN

## 2018-08-18 MED ORDER — HYDROMORPHONE HCL 1 MG/ML IJ SOLN
1.0000 mg | Freq: Once | INTRAMUSCULAR | Status: AC
  Administered 2018-08-18: 1 mg via INTRAVENOUS
  Filled 2018-08-18: qty 1

## 2018-08-18 MED ORDER — CIPROFLOXACIN IN D5W 400 MG/200ML IV SOLN
400.0000 mg | Freq: Once | INTRAVENOUS | Status: AC
  Administered 2018-08-18: 400 mg via INTRAVENOUS
  Filled 2018-08-18: qty 200

## 2018-08-18 MED ORDER — METOCLOPRAMIDE HCL 5 MG/ML IJ SOLN
5.0000 mg | Freq: Once | INTRAMUSCULAR | Status: AC
  Administered 2018-08-18: 5 mg via INTRAVENOUS
  Filled 2018-08-18: qty 2

## 2018-08-18 MED ORDER — PANTOPRAZOLE SODIUM 40 MG PO TBEC
40.0000 mg | DELAYED_RELEASE_TABLET | Freq: Every day | ORAL | Status: DC
  Administered 2018-08-18 – 2018-08-26 (×9): 40 mg via ORAL
  Filled 2018-08-18 (×9): qty 1

## 2018-08-18 MED ORDER — ACETAMINOPHEN 650 MG RE SUPP: 650.0000 mg | Freq: Four times a day (QID) | RECTAL | Status: DC | PRN

## 2018-08-18 MED ORDER — MOMETASONE FURO-FORMOTEROL FUM 200-5 MCG/ACT IN AERO
2.0000 | INHALATION_SPRAY | Freq: Two times a day (BID) | RESPIRATORY_TRACT | Status: DC
  Filled 2018-08-18: qty 8.8

## 2018-08-18 MED ORDER — OXYCODONE-ACETAMINOPHEN 5-325 MG PO TABS
1.0000 | ORAL_TABLET | ORAL | Status: DC | PRN
  Administered 2018-08-18 – 2018-08-26 (×34): 1 via ORAL
  Filled 2018-08-18 (×34): qty 1

## 2018-08-18 MED ORDER — FLUTICASONE FUROATE-VILANTEROL 200-25 MCG/INH IN AEPB
1.0000 | INHALATION_SPRAY | Freq: Every day | RESPIRATORY_TRACT | Status: DC
  Administered 2018-08-19 – 2018-08-26 (×3): 1 via RESPIRATORY_TRACT
  Filled 2018-08-18 (×2): qty 28

## 2018-08-18 MED ORDER — METRONIDAZOLE IN NACL 5-0.79 MG/ML-% IV SOLN
500.0000 mg | Freq: Three times a day (TID) | INTRAVENOUS | Status: DC
  Administered 2018-08-18 – 2018-08-20 (×7): 500 mg via INTRAVENOUS
  Filled 2018-08-18 (×7): qty 100

## 2018-08-18 MED ORDER — HYDROMORPHONE HCL 1 MG/ML IJ SOLN
1.0000 mg | INTRAMUSCULAR | Status: DC | PRN
  Administered 2018-08-19 – 2018-08-23 (×16): 1 mg via INTRAVENOUS
  Filled 2018-08-18 (×18): qty 1

## 2018-08-18 MED ORDER — SODIUM CHLORIDE 0.9 % IV SOLN
INTRAVENOUS | Status: DC
  Administered 2018-08-18 – 2018-08-21 (×4): via INTRAVENOUS

## 2018-08-18 MED ORDER — FLUTICASONE FUROATE-VILANTEROL 200-25 MCG/INH IN AEPB
1.0000 | INHALATION_SPRAY | Freq: Every day | RESPIRATORY_TRACT | Status: DC
  Filled 2018-08-18: qty 28

## 2018-08-18 MED ORDER — ONDANSETRON HCL 4 MG/2ML IJ SOLN
4.0000 mg | Freq: Four times a day (QID) | INTRAMUSCULAR | Status: DC | PRN
  Administered 2018-08-19: 4 mg via INTRAVENOUS
  Filled 2018-08-18: qty 2

## 2018-08-18 MED ORDER — POLYETHYLENE GLYCOL 3350 17 G PO PACK: 17.0000 g | PACK | Freq: Every day | ORAL | Status: DC | PRN

## 2018-08-18 MED ORDER — ACETAMINOPHEN 325 MG PO TABS
650.0000 mg | ORAL_TABLET | Freq: Four times a day (QID) | ORAL | Status: DC | PRN
  Administered 2018-08-22: 650 mg via ORAL
  Filled 2018-08-18: qty 2

## 2018-08-18 MED ORDER — SODIUM CHLORIDE 0.9 % IV BOLUS
1000.0000 mL | Freq: Once | INTRAVENOUS | Status: AC
  Administered 2018-08-18: 1000 mL via INTRAVENOUS

## 2018-08-18 MED ORDER — CIPROFLOXACIN IN D5W 400 MG/200ML IV SOLN
400.0000 mg | Freq: Two times a day (BID) | INTRAVENOUS | Status: DC
  Administered 2018-08-18 – 2018-08-20 (×4): 400 mg via INTRAVENOUS
  Filled 2018-08-18 (×5): qty 200

## 2018-08-18 NOTE — ED Notes (Signed)
Patient transported to x-ray. ?

## 2018-08-18 NOTE — ED Provider Notes (Addendum)
[facility] EMERGENCY DEPARTMENT Provider Note   CSN: [id number] Arrival date & time: 08/18/18  6578     History   Chief Complaint Chief Complaint  Patient presents with  . Emesis  . Vaginal Bleeding    HPI Dana Kennedy is a 52 y.o. female.  Complains of abdominal pain, diffuse onset 3 weeks ago, progressively worsening.  She was seen in the emergency department on 08/16/2018 diagnosed with diverticulitis started on Cipro and also prescribed Percocet with which she has been compliant.  She reports fever last night though she does not recall the actual value of her temperature.  Other associated symptoms include 6 episodes of vomiting in the past 24 hours she also developed vaginal spotting yesterday.  Last bowel movement yesterday, normal.  Pain is worse with movement or changing positions improved with remaining still.  Pain is severe at present and she feels nauseated.  HPI  Past Medical History:  Diagnosis Date  . Anemia   . Arthritis   . Asthma   . GERD (gastroesophageal reflux disease)   . History of ITP   . Lupus ([place])   . Lupus [facility])     Patient Active Problem List   Diagnosis Date Noted  . 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 Continuous tobacco abuse 05/02/2016  . 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 Status post tonsillectomy 05/02/2016  . Dehydration 10/12/2013  . Idiopathic thrombocytopenic purpura ([place]) 09/05/2012  . Lupus (systemic lupus erythematosus) ([place]) 09/05/2012  . Avascular necrosis of hip ([place]) 06/14/2012    Past Surgical History:  Procedure Laterality Date  . stomach ulcer surgery  2008  . TONSILLECTOMY    . TONSILLECTOMY    . TOTAL HIP ARTHROPLASTY  06/14/2012   Procedure: TOTAL HIP ARTHROPLASTY ANTERIOR APPROACH;  Surgeon: Mcarthur Rossetti, MD;  Location: [facility] ORS;  Service: Orthopedics;  Laterality: Left;  Left Total Hip Arthroplasty, Anterior Approach C-Arm     OB History    None      Home Medications    Prior to Admission medications   Medication Sig Start Date End Date Taking? Authorizing Provider  budesonide-formoterol (SYMBICORT) 160-4.5 MCG/ACT inhaler Inhale 2 puffs into the lungs 2 (two) times daily. 11/12/17  Yes Mannam, Praveen, MD  chlorpheniramine (CHLOR-TRIMETON) 4 MG tablet Take 2 tablets (8 mg total) by mouth 3 (three) times daily. 12/25/17  Yes Mannam, Praveen, MD  ciprofloxacin (CIPRO) 500 MG tablet Take 1 tablet (500 mg total) by mouth every 12 (twelve) hours. 08/16/18  Yes Idol, Almyra Free, PA-C  fluticasone (FLONASE) 50 MCG/ACT nasal spray Place 2 sprays into both nostrils daily. 11/12/17  Yes Mannam, Praveen, MD  fluticasone furoate-vilanterol (BREO ELLIPTA) 200-25 MCG/INH AEPB Inhale 1 puff into the lungs daily. 06/10/18  Yes Mannam, Praveen, MD  hydrocortisone cream 1 % Apply 1 application topically as needed for itching.   Yes [provider]  metroNIDAZOLE (FLAGYL) 500 MG tablet Take 1 tablet (500 mg total) by mouth 2 (two) times daily for 10 days. 08/16/18 08/26/18 Yes Idol, Almyra Free, PA-C  ondansetron (ZOFRAN ODT) 4 MG disintegrating tablet Take 1 tablet (4 mg total) by mouth every 8 (eight) hours as needed for nausea or vomiting. 05/06/18  Yes Fransico Meadow, PA-C  oxyCODONE-acetaminophen (PERCOCET/ROXICET) 5-325 MG tablet Take 1 tablet by mouth every 4 (four) hours as needed for severe pain. 08/16/18  Yes Idol, Almyra Free, PA-C  pantoprazole (PROTONIX) 20 MG tablet Take 1 tablet (20 mg total) by mouth  daily. 02/01/18  Yes Lacretia Leigh, MD  sucralfate (CARAFATE) 1 g tablet Take 1 tablet (1 g total) by mouth 4 (four) times daily. 02/01/18  Yes Lacretia Leigh, MD  albuterol (PROVENTIL HFA;VENTOLIN HFA) 108 (90 Base) MCG/ACT inhaler Inhale 2 puffs into the lungs every 4 (four) hours as needed for wheezing or shortness of breath. Patient not taking: Reported on 08/18/2018 04/14/17   Orpah Greek, MD  Nicotine 21-14-7 MG/24HR KIT Take as  directed Patient not taking: Reported on 08/18/2018 03/05/18   Marshell Garfinkel, MD  omeprazole (PRILOSEC) 20 MG capsule Take 1 capsule (20 mg total) by mouth daily. Patient not taking: Reported on 08/18/2018 11/12/17   Marshell Garfinkel, MD    Family History Family History  Problem Relation Age of Onset  . Cancer Mother   . Hypertension Mother     Social History Social History   Tobacco Use  . Smoking status: Former Smoker    Packs/day: 0.25    Years: 29.00    Pack years: 7.25    Types: Cigarettes    Last attempt to quit: 05/08/2018    Years since quitting: 0.2  . Smokeless tobacco: Never Used  Substance Use Topics  . Alcohol use: Yes    Comment: occasionally  . Drug use: No     Allergies   Allegra [fexofenadine]; Aspirin; Eszopiclone; and Restoril [temazepam]   Review of Systems Review of Systems  Constitutional: Negative.   HENT: Negative.   Respiratory: Negative.   Cardiovascular: Negative.   Gastrointestinal: Positive for abdominal pain and vomiting.  Genitourinary: Positive for vaginal discharge.       Postmenopausal  Musculoskeletal: Negative.   Skin: Negative.   Neurological: Negative.   Psychiatric/Behavioral: Negative.   All other systems reviewed and are negative.    Physical Exam Updated Vital Signs BP 112/67   Pulse 97   Temp 99.1 F (37.3 C) (Oral)   Resp (!) 22   Ht '5\' 4"'  (1.626 m)   Wt 97.1 kg   LMP 02/03/2018   SpO2 96%   BMI 36.73 kg/m   Physical Exam  Constitutional: She appears well-developed and well-nourished. She appears distressed.  Appears moderately uncomfortable  HENT:  Head: Normocephalic and atraumatic.  Eyes: Pupils are equal, round, and reactive to light. Conjunctivae are normal.  Neck: Neck supple. No tracheal deviation present. No thyromegaly present.  Cardiovascular: Normal rate and regular rhythm.  No murmur heard. Pulmonary/Chest: Effort normal and breath sounds normal.  Abdominal: Soft. Bowel sounds are normal.  She exhibits no distension. There is tenderness.  Midline surgical scar diffusely tender with voluntary guarding  Genitourinary:  Genitourinary Comments: Pelvic exam no external lesion.  Minimal amount of blood coming from cervical loss.  Cervical loss closed.  No cervical motion tenderness no adnexal masses or tenderness  Musculoskeletal: Normal range of motion. She exhibits no edema or tenderness.  Neurological: She is alert. Coordination normal.  Skin: Skin is warm and dry. No rash noted.  Psychiatric: She has a normal mood and affect.  Nursing note and vitals reviewed.    ED Treatments / Results  Labs (all labs ordered are listed, but only abnormal results are displayed) Labs Reviewed  CBC WITH DIFFERENTIAL/PLATELET - Abnormal; Notable for the following components:      Result Value   WBC 11.9 (*)    MCH 25.0 (*)    Platelets 462 (*)    Monocytes Absolute 1.1 (*)    All other components within normal limits  WET PREP,  GENITAL  BASIC METABOLIC PANEL  RPR  HIV ANTIBODY (ROUTINE TESTING W REFLEX)  I-STAT BETA HCG BLOOD, ED ([facility], [facility], [facility] ONLY)  GC/CHLAMYDIA PROBE AMP ([place]) NOT AT [facility]    EKG None  Radiology Ct Abdomen Pelvis W Contrast  Result Date: 08/16/2018 CLINICAL DATA:  Lower abdominal pain and no midline hernia. Some low back pain as hernia repair 2014 but states hernia has recurred. Pain worse over the past 3 days. EXAM: CT ABDOMEN AND PELVIS WITH CONTRAST TECHNIQUE: Multidetector CT imaging of the abdomen and pelvis was performed using the standard protocol following bolus administration of intravenous contrast. CONTRAST:  145m OMNIPAQUE IOHEXOL 300 MG/ML  SOLN COMPARISON:  05/06/2018 and 02/01/2018 FINDINGS: Lower chest: Calcified granuloma over the left lower lobe. Mild cardiomegaly. Hepatobiliary: Gallbladder, liver and biliary tree are normal. Pancreas: Normal. Spleen: Normal. Adrenals/Urinary Tract: Adrenal glands are normal. Kidneys are normal in size  without hydronephrosis or nephrolithiasis. Ureters and bladder are normal. Stomach/Bowel: Stomach and small bowel are normal. Appendix is within normal. Diverticulosis of the colon. There is inflammatory change adjacent a short segment of the thick-walled sigmoid colon in the left lower quadrant compatible with acute diverticulitis. There is no perforation or abscess. Vascular/Lymphatic: Normal. Reproductive: Within normal. Other: Periumbilical hernia containing peritoneal fat unchanged. Tiny amount of free pelvic fluid. Musculoskeletal: Left hip arthroplasty intact. Mild degenerate change of the right hip. IMPRESSION: Colonic diverticulosis with mild acute diverticulitis involving a short segment of the sigmoid colon in the left lower quadrant. No perforation or abscess. Known umbilical hernia containing peritoneal fat unchanged. Electronically Signed   By: DMarin OlpM.D.   On: 08/16/2018 20:11    Procedures Procedures (including critical care time)  Medications Ordered in ED Medications  sodium chloride 0.9 % bolus 1,000 mL (1,000 mLs Intravenous New Bag/Given 08/18/18 1023)  HYDROmorphone (DILAUDID) injection 1 mg (1 mg Intravenous Given 08/18/18 1023)  metoCLOPramide (REGLAN) injection 5 mg (5 mg Intravenous Given 08/18/18 1023)   Results for orders placed or performed during the hospital encounter of 08/18/18  Wet prep, genital  Result Value Ref Range   Yeast Wet Prep HPF POC NONE SEEN NONE SEEN   Trich, Wet Prep NONE SEEN NONE SEEN   Clue Cells Wet Prep HPF POC NONE SEEN NONE SEEN   WBC, Wet Prep HPF POC FEW (A) NONE SEEN   Sperm NONE SEEN   CBC with Differential/Platelet  Result Value Ref Range   WBC 11.9 (H) 4.0 - 10.5 K/uL   RBC 4.80 3.87 - 5.11 MIL/uL   Hemoglobin 12.0 12.0 - 15.0 g/dL   HCT 39.5 36.0 - 46.0 %   MCV 82.3 80.0 - 100.0 fL   MCH 25.0 (L) 26.0 - 34.0 pg   MCHC 30.4 30.0 - 36.0 g/dL   RDW 14.6 11.5 - 15.5 %   Platelets 462 (H) 150 - 400 K/uL   nRBC 0.0 0.0 - 0.2  %   Neutrophils Relative % 65 %   Neutro Abs 7.7 1.7 - 7.7 K/uL   Lymphocytes Relative 24 %   Lymphs Abs 2.9 0.7 - 4.0 K/uL   Monocytes Relative 9 %   Monocytes Absolute 1.1 (H) 0.1 - 1.0 K/uL   Eosinophils Relative 2 %   Eosinophils Absolute 0.3 0.0 - 0.5 K/uL   Basophils Relative 0 %   Basophils Absolute 0.0 0.0 - 0.1 K/uL   Immature Granulocytes 0 %   Abs Immature Granulocytes 0.05 0.00 - 0.07 K/uL  Basic metabolic panel  Result Value Ref Range   Sodium 137 135 - 145 mmol/L   Potassium 3.9 3.5 - 5.1 mmol/L   Chloride 103 98 - 111 mmol/L   CO2 25 22 - 32 mmol/L   Glucose, Bld 97 70 - 99 mg/dL   BUN 6 6 - 20 mg/dL   Creatinine, Ser 0.69 0.44 - 1.00 mg/dL   Calcium 9.0 8.9 - 10.3 mg/dL   GFR calc non Af Amer >60 >60 mL/min   GFR calc Af Amer >60 >60 mL/min   Anion gap 9 5 - 15  I-Stat Beta hCG blood, ED ([facility], [facility], [facility] only)  Result Value Ref Range   I-stat hCG, quantitative <5.0 <5 mIU/mL   Comment 3           Ct Abdomen Pelvis W Contrast  Result Date: 08/16/2018 CLINICAL DATA:  Lower abdominal pain and no midline hernia. Some low back pain as hernia repair 2014 but states hernia has recurred. Pain worse over the past 3 days. EXAM: CT ABDOMEN AND PELVIS WITH CONTRAST TECHNIQUE: Multidetector CT imaging of the abdomen and pelvis was performed using the standard protocol following bolus administration of intravenous contrast. CONTRAST:  153m OMNIPAQUE IOHEXOL 300 MG/ML  SOLN COMPARISON:  05/06/2018 and 02/01/2018 FINDINGS: Lower chest: Calcified granuloma over the left lower lobe. Mild cardiomegaly. Hepatobiliary: Gallbladder, liver and biliary tree are normal. Pancreas: Normal. Spleen: Normal. Adrenals/Urinary Tract: Adrenal glands are normal. Kidneys are normal in size without hydronephrosis or nephrolithiasis. Ureters and bladder are normal. Stomach/Bowel: Stomach and small bowel are normal. Appendix is within normal. Diverticulosis of the colon. There is inflammatory change adjacent  a short segment of the thick-walled sigmoid colon in the left lower quadrant compatible with acute diverticulitis. There is no perforation or abscess. Vascular/Lymphatic: Normal. Reproductive: Within normal. Other: Periumbilical hernia containing peritoneal fat unchanged. Tiny amount of free pelvic fluid. Musculoskeletal: Left hip arthroplasty intact. Mild degenerate change of the right hip. IMPRESSION: Colonic diverticulosis with mild acute diverticulitis involving a short segment of the sigmoid colon in the left lower quadrant. No perforation or abscess. Known umbilical hernia containing peritoneal fat unchanged. Electronically Signed   By: DMarin OlpM.D.   On: 08/16/2018 20:11   Dg Abd Acute W/chest  Result Date: 08/18/2018 CLINICAL DATA:  Abdominal pain and vomiting EXAM: DG ABDOMEN ACUTE W/ 1V CHEST COMPARISON:  12/25/2017 chest radiograph. 05/06/2017 abdominal radiograph FINDINGS: Stable cardiomediastinal silhouette with cardiomegaly. No pneumothorax. No pleural effusion. Mild bibasilar atelectasis. No pulmonary edema. No consolidative airspace disease. No dilated small bowel loops or significant air-fluid levels. Mild colonic stool. No evidence of pneumatosis or pneumoperitoneum. No radiopaque nephrolithiasis. Left total hip arthroplasty. Lower lumbar spondylosis. IMPRESSION: 1. Stable mild cardiomegaly without pulmonary edema. 2. Mild bibasilar atelectasis. 3. Nonobstructive bowel gas pattern. Electronically Signed   By: JIlona SorrelM.D.   On: 08/18/2018 11:54     Initial Impression / Assessment and Plan / ED Course  I have reviewed the triage vital signs and the nursing notes.  Pertinent labs & imaging results that were available during my care of the patient were reviewed by me and considered in my medical decision making (see chart for details).    10:45 AM pain and nausea improved after treatment with intravenous hydromorphone and intravenous Reglan 12:40 PM patient requesting  additional pain medicine.  Additional intravenous hydromorphone ordered.   105 PM pain improved after treatment with hydromorphone. Lab work remarkable for leukocytosis not present 2 days ago.  I Consulted to Dr.Stnison  from hospitalist service who will arrange for overnight stay. Patient has failed outpatient therapy for diverticulitis. X-rays viewed by me.  No evidence of perforation Vaginal bleeding is felt to be unrelated. Final Clinical Impressions(s) / ED Diagnoses  #1 diverticulitis of the sigmoid colon #2 abnormal vaginal bleeding Final diagnoses:  None    ED Discharge Orders    None       Orlie Dakin, MD 08/18/18 1313    Orlie Dakin, MD 08/18/18 1314

## 2018-08-18 NOTE — ED Triage Notes (Signed)
Patient to ED c/o worsening N/V/D - has been going for 2 weeks - seen here 2 days ago for same and CT scan showed diverticulitis. Patient adds that she's been having fevers/chills off and on (afebrile in triage). Also endorses lower abdominal pain, urinary frequency and new vaginal bleeding (spotting since last night).

## 2018-08-18 NOTE — Progress Notes (Signed)
Dana Kennedy is a 52 y.o. female patient admitted. Awake, alert - oriented  X 4 - no acute distress noted.  VSS - Blood pressure 135/86, pulse 89, temperature 98.4 F (36.9 C), temperature source Oral, resp. rate 20, height 5\' 4"  (1.626 m), weight 97.1 kg, last menstrual period 02/03/2018, SpO2 98 %.    IV in place, occlusive dsg intact without redness.  Orientation to room, and floor completed.  Admission INP armband ID verified with patient/family, and in place.   SR up x 2, fall assessment complete, with patient and family able to verbalize understanding of risk associated with falls, and verbalized understanding to call nsg before up out of bed.  Call light within reach, patient able to voice, and demonstrate understanding. No evidence of skin break down noted on exam.  Admission nurse notified of admission.     Will cont to eval and treat per MD orders.  Roger Kill, RN 08/18/2018 4:44 PM

## 2018-08-18 NOTE — H&P (Addendum)
History and Physical  Dana Kennedy QBH:[id number] DOB: [birth year] DOA: 08/18/2018  Referring physician: Dr Winfred Leeds, ED physician PCP: Patient, No Pcp Per  Outpatient Specialists:   Patient Coming From: home  Chief Complaint: Abdominal pain  HPI: Dana Kennedy is a 52 y.o. female with a history of asthma, GERD, history of ITP, lupus.  Patient seen 2 days ago for abdominal pain and diagnosed with mild diverticulitis without perforation or abscess.  The patient was prescribed ciprofloxacin and Flagyl to take twice a day.  The patient went home and proceeded to have vomiting yesterday and today.  She has been intolerant of food and liquids and has had difficulty keeping her medications down.  She denies fevers, chills.  Her abdominal pain has been increasing.  Is nonradiating but in the lower quadrants bilaterally, right greater than left.  Initially, she does note some mild postmenopausal bleeding that started this morning.  She characterizes it as spotting.  Emergency Department Course: Patient started on IV Cipro and Flagyl. WBC slightly increased a 11k.  Review of Systems:   Pt denies any fevers, chills, diarrhea, constipation, shortness of breath, dyspnea on exertion, orthopnea, cough, wheezing, palpitations, headache, vision changes, lightheadedness, dizziness, melena, rectal bleeding.  Review of systems are otherwise negative  Past Medical History:  Diagnosis Date  . Anemia   . Arthritis   . Asthma   . GERD (gastroesophageal reflux disease)   . History of ITP   . Lupus ([place])   . Lupus [facility])    Past Surgical History:  Procedure Laterality Date  . stomach ulcer surgery  2008  . TONSILLECTOMY    . TONSILLECTOMY    . TOTAL HIP ARTHROPLASTY  06/14/2012   Procedure: TOTAL HIP ARTHROPLASTY ANTERIOR APPROACH;  Surgeon: Mcarthur Rossetti, MD;  Location: [facility];  Service: Orthopedics;  Laterality: Left;  Left Total Hip Arthroplasty, Anterior Approach C-Arm   Social  History:  reports that she quit smoking about 3 months ago. Her smoking use included cigarettes. She has a 7.25 pack-year smoking history. She has never used smokeless tobacco. She reports that she drinks alcohol. She reports that she does not use drugs. Patient lives at home  Allergies  Allergen Reactions  . Allegra [Fexofenadine]     Pt states she began having spells of dry cough after taking this medication  . Aspirin     Causes bleeding  . Eszopiclone Swelling    Felt like throat was closing. Not sure if it was related to this medication or not. lunesta  . Restoril [Temazepam]     Headaches     Family History  Problem Relation Age of Onset  . Cancer Mother   . Hypertension Mother       Prior to Admission medications   Medication Sig Start Date End Date Taking? Authorizing Provider  budesonide-formoterol (SYMBICORT) 160-4.5 MCG/ACT inhaler Inhale 2 puffs into the lungs 2 (two) times daily. 11/12/17  Yes Mannam, Praveen, MD  chlorpheniramine (CHLOR-TRIMETON) 4 MG tablet Take 2 tablets (8 mg total) by mouth 3 (three) times daily. 12/25/17  Yes Mannam, Praveen, MD  ciprofloxacin (CIPRO) 500 MG tablet Take 1 tablet (500 mg total) by mouth every 12 (twelve) hours. 08/16/18  Yes Idol, Almyra Free, PA-C  fluticasone (FLONASE) 50 MCG/ACT nasal spray Place 2 sprays into both nostrils daily. 11/12/17  Yes Mannam, Praveen, MD  fluticasone furoate-vilanterol (BREO ELLIPTA) 200-25 MCG/INH AEPB Inhale 1 puff into the lungs daily. 06/10/18  Yes Mannam, Praveen, MD  hydrocortisone cream 1 %  Apply 1 application topically as needed for itching.   Yes [provider]  metroNIDAZOLE (FLAGYL) 500 MG tablet Take 1 tablet (500 mg total) by mouth 2 (two) times daily for 10 days. 08/16/18 08/26/18 Yes Idol, Almyra Free, PA-C  ondansetron (ZOFRAN ODT) 4 MG disintegrating tablet Take 1 tablet (4 mg total) by mouth every 8 (eight) hours as needed for nausea or vomiting. 05/06/18  Yes Fransico Meadow, PA-C    oxyCODONE-acetaminophen (PERCOCET/ROXICET) 5-325 MG tablet Take 1 tablet by mouth every 4 (four) hours as needed for severe pain. 08/16/18  Yes Idol, Almyra Free, PA-C  pantoprazole (PROTONIX) 20 MG tablet Take 1 tablet (20 mg total) by mouth daily. 02/01/18  Yes Lacretia Leigh, MD  sucralfate (CARAFATE) 1 g tablet Take 1 tablet (1 g total) by mouth 4 (four) times daily. 02/01/18  Yes Lacretia Leigh, MD  albuterol (PROVENTIL HFA;VENTOLIN HFA) 108 (90 Base) MCG/ACT inhaler Inhale 2 puffs into the lungs every 4 (four) hours as needed for wheezing or shortness of breath. Patient not taking: Reported on 08/18/2018 04/14/17   Orpah Greek, MD  Nicotine 21-14-7 MG/24HR KIT Take as directed Patient not taking: Reported on 08/18/2018 03/05/18   Marshell Garfinkel, MD  omeprazole (PRILOSEC) 20 MG capsule Take 1 capsule (20 mg total) by mouth daily. Patient not taking: Reported on 08/18/2018 11/12/17   Marshell Garfinkel, MD    Physical Exam: BP 137/86 (BP Location: Right Arm)   Pulse 91   Temp 99.1 F (37.3 C) (Oral)   Resp 16   Ht _0  (1.626 m)   Wt 97.1 kg   LMP 02/03/2018   SpO2 95%   BMI 36.73 kg/m   . General: Middle-aged black female. Awake and alert and oriented x3. No acute cardiopulmonary distress.  Marland Kitchen HEENT: Normocephalic atraumatic.  Right and left ears normal in appearance.  Pupils equal, round, reactive to light. Extraocular muscles are intact. Sclerae anicteric and noninjected.  Moist mucosal membranes. No mucosal lesions.  . Neck: Neck supple without lymphadenopathy. No carotid bruits. No masses palpated.  . Cardiovascular: Regular rate with normal S1-S2 sounds. No murmurs, rubs, gallops auscultated. No JVD.  Marland Kitchen Respiratory: Good respiratory effort with no wheezes, rales, rhonchi. Lungs clear to auscultation bilaterally.  No accessory muscle use. . Abdomen: Soft, tender in the lower quadrants bilaterally right greater than left.  No rebound or guarding. Nondistended. Active bowel sounds.  No masses or hepatosplenomegaly  . Skin: No rashes, lesions, or ulcerations.  Dry, warm to touch. 2+ dorsalis pedis and radial pulses. . Musculoskeletal: No calf or leg pain. All major joints not erythematous nontender.  No upper or lower joint deformation.  Good ROM.  No contractures  . Psychiatric: Intact judgment and insight. Pleasant and cooperative. . Neurologic: No focal neurological deficits. Strength is 5/5 and symmetric in upper and lower extremities.  Cranial nerves II through XII are grossly intact.           Labs on Admission: I have personally reviewed following labs and imaging studies  CBC: Recent Labs  Lab 08/16/18 1830 08/18/18 1017  WBC 10.5 11.9*  NEUTROABS 7.1 7.7  HGB 11.9* 12.0  HCT 39.7 39.5  MCV 83.6 82.3  PLT 377 242*   Basic Metabolic Panel: Recent Labs  Lab 08/16/18 1830 08/18/18 1017  NA 138 137  K 3.8 3.9  CL 110 103  CO2 22 25  GLUCOSE 118* 97  BUN 10 6  CREATININE 0.88 0.69  CALCIUM 9.1 9.0   GFR:  Estimated Creatinine Clearance: 94.2 mL/min (by C-G formula based on SCr of 0.69 mg/dL). Liver Function Tests: Recent Labs  Lab 08/16/18 1830  AST 15  ALT 13  ALKPHOS 68  BILITOT 0.3  PROT 6.3*  ALBUMIN 3.4*   No results for input(s): LIPASE, AMYLASE in the last 168 hours. No results for input(s): AMMONIA in the last 168 hours. Coagulation Profile: No results for input(s): INR, PROTIME in the last 168 hours. Cardiac Enzymes: No results for input(s): CKTOTAL, CKMB, CKMBINDEX, TROPONINI in the last 168 hours. BNP (last 3 results) No results for input(s): PROBNP in the last 8760 hours. HbA1C: No results for input(s): HGBA1C in the last 72 hours. CBG: No results for input(s): GLUCAP in the last 168 hours. Lipid Profile: No results for input(s): CHOL, HDL, LDLCALC, TRIG, CHOLHDL, LDLDIRECT in the last 72 hours. Thyroid Function Tests: No results for input(s): TSH, T4TOTAL, FREET4, T3FREE, THYROIDAB in the last 72 hours. Anemia  Panel: No results for input(s): VITAMINB12, FOLATE, FERRITIN, TIBC, IRON, RETICCTPCT in the last 72 hours. Urine analysis:    Component Value Date/Time   COLORURINE YELLOW 08/16/2018 1921   APPEARANCEUR HAZY (A) 08/16/2018 1921   LABSPEC 1.028 08/16/2018 1921   PHURINE 5.0 08/16/2018 1921   GLUCOSEU NEGATIVE 08/16/2018 1921   HGBUR MODERATE (A) 08/16/2018 1921   BILIRUBINUR NEGATIVE 08/16/2018 1921   KETONESUR NEGATIVE 08/16/2018 1921   PROTEINUR NEGATIVE 08/16/2018 1921   UROBILINOGEN 0.2 09/22/2014 2302   NITRITE NEGATIVE 08/16/2018 1921   LEUKOCYTESUR SMALL (A) 08/16/2018 1921   Sepsis Labs: _0 (procalcitonin:4,lacticidven:4) ) Recent Results (from the past 240 hour(s))  Wet prep, genital     Status: Abnormal   Collection Time: 08/18/18 10:30 AM  Result Value Ref Range Status   Yeast Wet Prep HPF POC NONE SEEN NONE SEEN Final   Trich, Wet Prep NONE SEEN NONE SEEN Final   Clue Cells Wet Prep HPF POC NONE SEEN NONE SEEN Final   WBC, Wet Prep HPF POC FEW (A) NONE SEEN Final   Sperm NONE SEEN  Final    Comment: Performed at [facility], [place]. [address], [place], [place] [address]     Radiological Exams on Admission: Ct Abdomen Pelvis W Contrast  Result Date: 08/16/2018 CLINICAL DATA:  Lower abdominal pain and no midline hernia. Some low back pain as hernia repair 2014 but states hernia has recurred. Pain worse over the past 3 days. EXAM: CT ABDOMEN AND PELVIS WITH CONTRAST TECHNIQUE: Multidetector CT imaging of the abdomen and pelvis was performed using the standard protocol following bolus administration of intravenous contrast. CONTRAST:  153m OMNIPAQUE IOHEXOL 300 MG/ML  SOLN COMPARISON:  05/06/2018 and 02/01/2018 FINDINGS: Lower chest: Calcified granuloma over the left lower lobe. Mild cardiomegaly. Hepatobiliary: Gallbladder, liver and biliary tree are normal. Pancreas: Normal. Spleen: Normal. Adrenals/Urinary Tract: Adrenal glands are normal. Kidneys are normal  in size without hydronephrosis or nephrolithiasis. Ureters and bladder are normal. Stomach/Bowel: Stomach and small bowel are normal. Appendix is within normal. Diverticulosis of the colon. There is inflammatory change adjacent a short segment of the thick-walled sigmoid colon in the left lower quadrant compatible with acute diverticulitis. There is no perforation or abscess. Vascular/Lymphatic: Normal. Reproductive: Within normal. Other: Periumbilical hernia containing peritoneal fat unchanged. Tiny amount of free pelvic fluid. Musculoskeletal: Left hip arthroplasty intact. Mild degenerate change of the right hip. IMPRESSION: Colonic diverticulosis with mild acute diverticulitis involving a short segment of the sigmoid colon in the left lower quadrant. No perforation or  abscess. Known umbilical hernia containing peritoneal fat unchanged. Electronically Signed   By: Marin Olp M.D.   On: 08/16/2018 20:11   Dg Abd Acute W/chest  Result Date: 08/18/2018 CLINICAL DATA:  Abdominal pain and vomiting EXAM: DG ABDOMEN ACUTE W/ 1V CHEST COMPARISON:  12/25/2017 chest radiograph. 05/06/2017 abdominal radiograph FINDINGS: Stable cardiomediastinal silhouette with cardiomegaly. No pneumothorax. No pleural effusion. Mild bibasilar atelectasis. No pulmonary edema. No consolidative airspace disease. No dilated small bowel loops or significant air-fluid levels. Mild colonic stool. No evidence of pneumatosis or pneumoperitoneum. No radiopaque nephrolithiasis. Left total hip arthroplasty. Lower lumbar spondylosis. IMPRESSION: 1. Stable mild cardiomegaly without pulmonary edema. 2. Mild bibasilar atelectasis. 3. Nonobstructive bowel gas pattern. Electronically Signed   By: Ilona Sorrel M.D.   On: 08/18/2018 11:54    Assessment/Plan: Principal Problem:   Acute diverticulitis Active Problems:   GERD (gastroesophageal reflux disease)   Asthma   Vaginal spotting    This patient was discussed with the ED physician,  including pertinent vitals, physical exam findings, labs, and imaging.  We also discussed care given by the ED provider.  1. Acute diverticulitis a. Admit b. Failed outpatient therapy c. IV Flagyl and Cipro d. Clear liquid diet e. Check CBC tomorrow 2. Vaginal spotting a. As patient having extremely light spotting, will defer work-up until outpatient.   b. Does have an [facility]GYN in [facility] c. She will need endometrial biopsy and possibly ultrasound as well 3. GERD a. To Protonix 4. Asthma a. Continue controller inhalers  DVT prophylaxis: Lovenox Consultants: None Code Status: Full code Family Communication: Has been present during interview and exam Disposition Plan: Return home following improvement   Truett Mainland, DO [facility] Pager [phone]  If 7PM-7AM, please contact night-coverage [email or web address] Password [email or web address]

## 2018-08-18 NOTE — Plan of Care (Signed)

## 2018-08-19 ENCOUNTER — Encounter (HOSPITAL_COMMUNITY): Payer: Self-pay | Admitting: General Practice

## 2018-08-19 DIAGNOSIS — J4520 Mild intermittent asthma, uncomplicated: Secondary | ICD-10-CM

## 2018-08-19 DIAGNOSIS — K429 Umbilical hernia without obstruction or gangrene: Secondary | ICD-10-CM

## 2018-08-19 LAB — BASIC METABOLIC PANEL
Anion gap: 8 (ref 5–15)
BUN: <5 mg/dL — ABNORMAL LOW (ref 6–20)
CO2: 26 mmol/L (ref 22–32)
Calcium: 8.6 mg/dL — ABNORMAL LOW (ref 8.9–10.3)
Chloride: 102 mmol/L (ref 98–111)
Creatinine, Ser: 0.7 mg/dL (ref 0.44–1.00)
GFR calc Af Amer: >60 mL/min (ref >60)
GFR calc non Af Amer: >60 mL/min (ref >60)
GLUCOSE: 103 mg/dL — AB (ref 70–99)
Potassium: 3.9 mmol/L (ref 3.5–5.1)
Sodium: 136 mmol/L (ref 135–145)

## 2018-08-19 LAB — CBC
HEMATOCRIT: 34.6 % — AB (ref 36.0–46.0)
Hemoglobin: 11 g/dL — ABNORMAL LOW (ref 12.0–15.0)
MCH: 25.8 pg — ABNORMAL LOW (ref 26.0–34.0)
MCHC: 31.8 g/dL (ref 30.0–36.0)
MCV: 81 fL (ref 80.0–100.0)
Platelets: 383 10*3/uL (ref 150–400)
RBC: 4.27 MIL/uL (ref 3.87–5.11)
RDW: 14.6 % (ref 11.5–15.5)
WBC: 12 10*3/uL — ABNORMAL HIGH (ref 4.0–10.5)
nRBC: 0 % (ref 0.0–0.2)

## 2018-08-19 LAB — GC/CHLAMYDIA PROBE AMP (~~LOC~~) NOT AT ARMC
Chlamydia: NEGATIVE
Neisseria Gonorrhea: NEGATIVE

## 2018-08-19 LAB — HIV ANTIBODY (ROUTINE TESTING W REFLEX): HIV Screen 4th Generation wRfx: NONREACTIVE

## 2018-08-19 NOTE — Progress Notes (Signed)
[facility] Progress Note  Subjective: no new c/o's, pain persists, nausea as well  Vitals:   08/18/18 1613 08/18/18 2114 08/19/18 0446 08/19/18 0903  BP: 135/86 (!) 143/84 130/89   Pulse: 89 93 96   Resp: 20 16 18    Temp: 98.4 F (36.9 C) 100.2 F (37.9 C) 98.3 F (36.8 C)   TempSrc: Oral Oral Oral   SpO2: 98% 99% 98% 96%  Weight:      Height:        Inpatient medications: . enoxaparin (LOVENOX) injection  40 mg Subcutaneous Q24H  . fluticasone furoate-vilanterol  1 puff Inhalation Daily  . pantoprazole  40 mg Oral Daily   . sodium chloride 125 mL/hr at 08/19/18 0500  . ciprofloxacin Stopped (08/18/18 2355)  . metronidazole 500 mg (08/19/18 4098)   acetaminophen **OR** acetaminophen, HYDROmorphone (DILAUDID) injection, ondansetron **OR** ondansetron (ZOFRAN) IV, oxyCODONE-acetaminophen, polyethylene glycol  Exam: Alert, no distress, lying flat, looks slightly uncomfortable  no jvd Chest cta bilat Cor reg no mrg  Abd distended, mild diffuse lower abd tenderness, no rebound, dec'd BS  Ext no edema or wounds  Neuro NF, ox 3     Home meds:  - omeprazole 20 qd/ sucralfate 1gm qid/ pantoprazole 40 qd  - budesonide-formoterol 2 puffs bid/ fluticasone furoate-vilanterol 1 puff qd/ prn albuterol HFA  - ciprofloxacin 500 bid/ metronidazole 500 bid  - oxycodone -aceta prn  Presentation Summary: Dana Kennedy is a 52 y.o. female with a history of asthma, GERD, history of ITP, lupus.  Patient seen 2 days ago for abdominal pain and diagnosed with mild diverticulitis without perforation or abscess.  The patient was prescribed ciprofloxacin and Flagyl to take twice a day.  The patient went home and proceeded to have vomiting yesterday and today.  She has been intolerant of food and liquids and has had difficulty keeping her medications down.  She denies fevers, chills.  Her abdominal pain has been increasing.  Is nonradiating but in the lower quadrants bilaterally, right  greater than left. Also, she does note some mild postmenopausal bleeding that started this morning.  She characterizes it as spotting.  Emergency Department Course: Patient started on IV Cipro and Flagyl. WBC slightly increased a 11k.         Hospital Problems/ Course:   Acute diverticulitis: pt presented 11/29 w/ 2 weeks N/V/D and abdominal pain, LLQ.  She is followed by [facility] for periumbilical hernia and is awaiting surgery. She had CT scan which showed "a short segment of the thick-walled sigmoid colon in the left lower quadrant compatible with acute diverticulitis". The periumbilical hernia was not incarcerated per CT.  She was dc'd from ED on po abx cipro and flagyl.  Returned yest to ED w/ worsening LLQ pain and nausea w difficulty keeping medications down.  Admitted for IV abx , failure of po Rx.   - today pain is no worse or better, taking liquids minimally - cont IV abx w cipro/ flagyl  - cont prn's for pain, nausea  - check cbc in am   Vaginal spotting: ss patient having extremely light spotting, will defer work-up until outpatient. Does have an OB/GYN in [facility]   H/o lupus - not an active problem, had it "in my bones", not on medication   H/o ITP -  Platelets wnl here > 300k , stable   Asthma - continue inhalers   Code Status: full DVT Prophylaxis: lovenox Family Communication: husband at bedside Disposition Plan: not  ready for dispo Status: inpatient      Kelly Splinter MD [facility] pgr [phone] 08/19/2018, 9:57 AM   Recent Labs  Lab 08/16/18 1830 08/18/18 1017 08/19/18 0152  NA 138 137 136  K 3.8 3.9 3.9  CL 110 103 102  CO2 22 25 26   GLUCOSE 118* 97 103*  BUN 10 6 <5*  CREATININE 0.88 0.69 0.70  CALCIUM 9.1 9.0 8.6*   Recent Labs  Lab 08/16/18 1830  AST 15  ALT 13  ALKPHOS 68  BILITOT 0.3  PROT 6.3*  ALBUMIN 3.4*   Recent Labs  Lab 08/16/18 1830 08/18/18 1017 08/19/18 0152  WBC 10.5 11.9* 12.0*   NEUTROABS 7.1 7.7  --   HGB 11.9* 12.0 11.0*  HCT 39.7 39.5 34.6*  MCV 83.6 82.3 81.0  PLT 377 462* 383   Iron/TIBC/Ferritin/ %Sat    Component Value Date/Time   IRON 22 (L) 10/24/2012 0855   TIBC 426 10/24/2012 0855   FERRITIN 20 10/24/2012 0855   IRONPCTSAT 5 (L) 10/24/2012 0321

## 2018-08-20 ENCOUNTER — Inpatient Hospital Stay: Payer: Medicaid Other

## 2018-08-20 ENCOUNTER — Inpatient Hospital Stay: Payer: Medicaid Other | Admitting: Hematology

## 2018-08-20 DIAGNOSIS — R1032 Left lower quadrant pain: Secondary | ICD-10-CM

## 2018-08-20 LAB — CBC
HCT: 36.7 % (ref 36.0–46.0)
Hemoglobin: 11 g/dL — ABNORMAL LOW (ref 12.0–15.0)
MCH: 24.6 pg — ABNORMAL LOW (ref 26.0–34.0)
MCHC: 30 g/dL (ref 30.0–36.0)
MCV: 82.1 fL (ref 80.0–100.0)
PLATELETS: 429 10*3/uL — AB (ref 150–400)
RBC: 4.47 MIL/uL (ref 3.87–5.11)
RDW: 14.4 % (ref 11.5–15.5)
WBC: 11.4 10*3/uL — ABNORMAL HIGH (ref 4.0–10.5)
nRBC: 0 % (ref 0.0–0.2)

## 2018-08-20 LAB — RPR: RPR Ser Ql: NONREACTIVE

## 2018-08-20 MED ORDER — PIPERACILLIN-TAZOBACTAM 3.375 G IVPB
3.3750 g | Freq: Three times a day (TID) | INTRAVENOUS | Status: DC
  Administered 2018-08-20 – 2018-08-21 (×3): 3.375 g via INTRAVENOUS
  Filled 2018-08-20 (×3): qty 50

## 2018-08-20 NOTE — Progress Notes (Signed)
[facility] Progress Note  Subjective: no new c/o's, passed some gas, still sig pain not any worse or better. Tmax 99 yest which is down some. WBC 11.4 yest.   Vitals:   08/19/18 1352 08/19/18 2031 08/20/18 0533 08/20/18 1403  BP: (!) 135/93 128/87 (!) 139/92 138/90  Pulse: 94 90 91 88  Resp: 18     Temp: 99.7 F (37.6 C) 99 F (37.2 C) 98.3 F (36.8 C) 98.6 F (37 C)  TempSrc: Oral Oral Oral Oral  SpO2: 97% 98% 96% 93%  Weight:      Height:        Inpatient medications: . enoxaparin (LOVENOX) injection  40 mg Subcutaneous Q24H  . fluticasone furoate-vilanterol  1 puff Inhalation Daily  . pantoprazole  40 mg Oral Daily   . sodium chloride 125 mL/hr at 08/19/18 2038  . ciprofloxacin 400 mg (08/20/18 1045)  . metronidazole 500 mg (08/20/18 1422)   acetaminophen **OR** acetaminophen, HYDROmorphone (DILAUDID) injection, ondansetron **OR** ondansetron (ZOFRAN) IV, oxyCODONE-acetaminophen, polyethylene glycol  Exam: Alert, no distress, lying flat, looks slightly uncomfortable  no jvd Chest cta bilat Cor reg no mrg  Abd distended, mild diffuse lower abd tenderness, no rebound, dec'd BS  Ext no edema or wounds  Neuro NF, ox 3     Home meds:  - omeprazole 20 qd/ sucralfate 1gm qid/ pantoprazole 40 qd  - budesonide-formoterol 2 puffs bid/ fluticasone furoate-vilanterol 1 puff qd/ prn albuterol HFA  - ciprofloxacin 500 bid/ metronidazole 500 bid  - oxycodone -aceta prn  Presentation Summary: Dana Kennedy is a 52 y.o. female with a history of asthma, GERD, history of ITP, lupus.  Patient seen 2 days ago for abdominal pain and diagnosed with mild diverticulitis without perforation or abscess.  The patient was prescribed ciprofloxacin and Flagyl to take twice a day.  The patient went home and proceeded to have vomiting yesterday and today.  She has been intolerant of food and liquids and has had difficulty keeping her medications down.  She denies fevers, chills.  Her  abdominal pain has been increasing.  Is nonradiating but in the lower quadrants bilaterally, right greater than left. Also, she does note some mild postmenopausal bleeding that started this morning.  She characterizes it as spotting.  Emergency Department Course: Patient started on IV Cipro and Flagyl. WBC slightly increased a 11k.         Hospital Problems/ Course:   Acute diverticulitis: pt presented 11/29 w/ 2 weeks N/V/D and abdominal pain, LLQ.  She is followed by [facility] for periumbilical hernia and is awaiting surgery. She had CT scan which showed "a short segment of the thick-walled sigmoid colon in the left lower quadrant compatible with acute diverticulitis". The periumbilical hernia was not incarcerated per CT.  She was dc'd from ED on po abx cipro and flagyl.  Returned yest to ED w/ worsening LLQ pain and nausea w difficulty keeping medications down.  Admitted for IV abx , failure of po Rx.   - today pain is no worse or better, taking liquids minimally - will switch abx to IV Zosyn, if not improving soon will get repeat CT to look for extension/ perf/ abscess , etc  - cont prn's for pain, nausea  - check cbc/ bmet in am  - cont IVF's   Vaginal spotting: ss patient having light spotting, will defer work-up until outpatient. Does have an OB/GYN in [facility]   H/o lupus - not an active problem, had  it "in my bones", not on medication   H/o ITP -  Platelets wnl here > 300k , stable   Asthma - continue inhalers   Periumbilical hernia - is f/b [place] [facility] for this and is awaiting surgery   Code Status: full DVT Prophylaxis: lovenox Family Communication: husband at bedside Disposition Plan: when medically better Status: inpatient      Kelly Splinter MD [facility] pgr [phone] 08/20/2018, 4:34 PM   Recent Labs  Lab 08/16/18 1830 08/18/18 1017 08/19/18 0152  NA 138 137 136  K 3.8 3.9 3.9  CL 110 103 102  CO2 22 25 26    GLUCOSE 118* 97 103*  BUN 10 6 <5*  CREATININE 0.88 0.69 0.70  CALCIUM 9.1 9.0 8.6*   Recent Labs  Lab 08/16/18 1830  AST 15  ALT 13  ALKPHOS 68  BILITOT 0.3  PROT 6.3*  ALBUMIN 3.4*   Recent Labs  Lab 08/16/18 1830 08/18/18 1017 08/19/18 0152 08/20/18 0222  WBC 10.5 11.9* 12.0* 11.4*  NEUTROABS 7.1 7.7  --   --   HGB 11.9* 12.0 11.0* 11.0*  HCT 39.7 39.5 34.6* 36.7  MCV 83.6 82.3 81.0 82.1  PLT 377 462* 383 429*   Iron/TIBC/Ferritin/ %Sat    Component Value Date/Time   IRON 22 (L) 10/24/2012 0855   TIBC 426 10/24/2012 0855   FERRITIN 20 10/24/2012 0855   IRONPCTSAT 5 (L) 10/24/2012 4975

## 2018-08-20 NOTE — Plan of Care (Signed)
  Problem: Pain Managment: Goal: General experience of comfort will improve Outcome: Progressing   Problem: Safety: Goal: Ability to remain free from injury will improve Outcome: Progressing   Problem: Skin Integrity: Goal: Risk for impaired skin integrity will decrease Outcome: Progressing   

## 2018-08-20 NOTE — Progress Notes (Addendum)
Pharmacy Antibiotic Note  Dana Kennedy is a 52 y.o. female admitted on 08/18/2018 with acute diverticulitis, worsening N/VD on oral cipro and flagyl.  Pharmacy has been consulted for Zosyn dosing for intra-abdominal infection, acute diverticulitis not responding to cipro/ flagyl.  Dr. Jonnie Finner plans to get repeat CT to look for extension/ perf/ abscess , etc   Plan: Zosyn 3.375 g IV q8 hour (4 hour infusions) Monitor clinical status, renal function and culture results daily.    Height: 5\' 4"  (162.6 cm) Weight: 214 lb (97.1 kg) IBW/kg (Calculated) : 54.7  Temp (24hrs), Avg:98.6 F (37 C), Min:98.3 F (36.8 C), Max:99 F (37.2 C)  Recent Labs  Lab 08/16/18 1830 08/16/18 1843 08/16/18 2029 08/18/18 1017 08/19/18 0152 08/20/18 0222  WBC 10.5  --   --  11.9* 12.0* 11.4*  CREATININE 0.88  --   --  0.69 0.70  --   LATICACIDVEN  --  1.25 0.87  --   --   --     Estimated Creatinine Clearance: 94.2 mL/min (by C-G formula based on SCr of 0.7 mg/dL).    Allergies  Allergen Reactions  . Allegra [Fexofenadine]     Pt states she began having spells of dry cough after taking this medication  . Aspirin     Causes bleeding  . Eszopiclone Swelling    Felt like throat was closing. Not sure if it was related to this medication or not. lunesta  . Restoril [Temazepam]     Headaches     Antimicrobials this admission: Cipro 12/1>>12/3 Flagyl 12/1>>12/3 Zosyn 12/3>>  Dose adjustments this admission: n/a  Microbiology results:  Thank you for allowing pharmacy to be a part of this patient's care. Nicole Cella, RPh Clinical Pharmacist Please check AMION for all [facility] phone numbers After 10:00 PM, call [place] [phone] 08/20/2018 5:19 PM

## 2018-08-20 NOTE — Progress Notes (Signed)
NT called to room noted bed pad soiled with some blood. Pt stated she is having more vaginal bleeding. NT gave pt  mesh panties and sanitary pad to wear. Will cont to monitor.

## 2018-08-21 ENCOUNTER — Inpatient Hospital Stay (HOSPITAL_COMMUNITY): Payer: Medicaid Other

## 2018-08-21 LAB — BASIC METABOLIC PANEL
ANION GAP: 12 (ref 5–15)
BUN: <5 mg/dL — ABNORMAL LOW (ref 6–20)
CO2: 24 mmol/L (ref 22–32)
Calcium: 8.6 mg/dL — ABNORMAL LOW (ref 8.9–10.3)
Chloride: 103 mmol/L (ref 98–111)
Creatinine, Ser: 0.69 mg/dL (ref 0.44–1.00)
GFR calc Af Amer: >60 mL/min (ref >60)
GFR calc non Af Amer: >60 mL/min (ref >60)
Glucose, Bld: 118 mg/dL — ABNORMAL HIGH (ref 70–99)
Potassium: 3.1 mmol/L — ABNORMAL LOW (ref 3.5–5.1)
Sodium: 139 mmol/L (ref 135–145)

## 2018-08-21 LAB — CBC
HCT: 34.4 % — ABNORMAL LOW (ref 36.0–46.0)
Hemoglobin: 11 g/dL — ABNORMAL LOW (ref 12.0–15.0)
MCH: 25.6 pg — ABNORMAL LOW (ref 26.0–34.0)
MCHC: 32 g/dL (ref 30.0–36.0)
MCV: 80 fL (ref 80.0–100.0)
NRBC: 0 % (ref 0.0–0.2)
Platelets: 419 10*3/uL — ABNORMAL HIGH (ref 150–400)
RBC: 4.3 MIL/uL (ref 3.87–5.11)
RDW: 14.2 % (ref 11.5–15.5)
WBC: 13.4 10*3/uL — AB (ref 4.0–10.5)

## 2018-08-21 MED ORDER — IOHEXOL 300 MG/ML  SOLN
100.0000 mL | Freq: Once | INTRAMUSCULAR | Status: AC | PRN
  Administered 2018-08-21: 100 mL via INTRAVENOUS

## 2018-08-21 MED ORDER — POTASSIUM CHLORIDE CRYS ER 20 MEQ PO TBCR
40.0000 meq | EXTENDED_RELEASE_TABLET | Freq: Once | ORAL | Status: AC
  Administered 2018-08-21: 40 meq via ORAL
  Filled 2018-08-21: qty 2

## 2018-08-21 MED ORDER — KCL IN DEXTROSE-NACL 20-5-0.45 MEQ/L-%-% IV SOLN
INTRAVENOUS | Status: DC
  Administered 2018-08-21 – 2018-08-24 (×7): via INTRAVENOUS
  Filled 2018-08-21 (×9): qty 1000

## 2018-08-21 MED ORDER — SODIUM CHLORIDE 0.9 % IV SOLN
1.0000 g | INTRAVENOUS | Status: DC
  Administered 2018-08-21 – 2018-08-25 (×5): 1000 mg via INTRAVENOUS
  Filled 2018-08-21 (×5): qty 1

## 2018-08-21 NOTE — Consult Note (Signed)
Reason for Consult: Diverticulitis Referring Physician: Denton Brick MD  Dana [place] is an 52 y.o. female.  HPI: Asked to see patient at the request of Dr.Emokpae MD for complicated diverticulitis.  The patient is a history of outpatient treatment of her diverticulitis for the last month.  She states she has had trouble this year with 2 bouts of diverticulitis.  She was admitted on the first due to failure of outpatient management while being on Cipro Flagyl last week.  She has been switched to 3 different antibiotics and 3 days.  She had more pain today and a CT scan of was obtained which showed progression of her acute diverticulitis.  Mention of appendicitis was made but this appears to be part of the inflammatory phlegmon from the diverticulitis.  Her white count is 13,400.  Her vital signs are stable.  She does have some nausea and anorexia.  Her pain is mostly in her lower abdomen.  Location left lower quadrant sharp in nature anywhere from 7 to a 10 out of 10.  Pain is made worse with movement.  Past Medical History:  Diagnosis Date  . Anemia   . Asthma   . Chronic lower back pain   . GERD (gastroesophageal reflux disease)   . History of blood transfusion    "HgB low w/lupus flareup" (08/19/2018)  . History of ITP   . Rheumatoid arthritis ([place])    "legs; all my bones" (08/19/2018)  . SLE (systemic lupus erythematosus) ([place])     Past Surgical History:  Procedure Laterality Date  . HERNIA REPAIR    . stomach ulcer surgery  2008  . TONSILLECTOMY    . TOTAL HIP ARTHROPLASTY  06/14/2012   Procedure: TOTAL HIP ARTHROPLASTY ANTERIOR APPROACH;  Surgeon: Mcarthur Rossetti, MD;  Location: [facility];  Service: Orthopedics;  Laterality: Left;  Left Total Hip Arthroplasty, Anterior Approach C-Arm  . TUBAL LIGATION    . UMBILICAL HERNIA REPAIR      Family History  Problem Relation Age of Onset  . Cancer Mother   . Hypertension Mother     Social History:  reports that she quit smoking  about 3 months ago. Her smoking use included cigarettes. She has a 72.50 pack-year smoking history. She has never used smokeless tobacco. She reports that she drinks alcohol. She reports that she does not use drugs.  Allergies:  Allergies  Allergen Reactions  . Allegra [Fexofenadine]     Pt states she began having spells of dry cough after taking this medication  . Aspirin     Causes bleeding  . Eszopiclone Swelling    Felt like throat was closing. Not sure if it was related to this medication or not. lunesta  . Restoril [Temazepam]     Headaches     Medications: I have reviewed the patient's current medications.  Results for orders placed or performed during the hospital encounter of 08/18/18 (from the past 48 hour(s))  CBC     Status: Abnormal   Collection Time: 08/20/18  2:22 AM  Result Value Ref Range   WBC 11.4 (H) 4.0 - 10.5 K/uL   RBC 4.47 3.87 - 5.11 MIL/uL   Hemoglobin 11.0 (L) 12.0 - 15.0 g/dL   HCT 36.7 36.0 - 46.0 %   MCV 82.1 80.0 - 100.0 fL   MCH 24.6 (L) 26.0 - 34.0 pg   MCHC 30.0 30.0 - 36.0 g/dL   RDW 14.4 11.5 - 15.5 %   Platelets 429 (H) 150 -  400 K/uL   nRBC 0.0 0.0 - 0.2 %    Comment: Performed at [facility], [place] [address] [place], [place] [address]  CBC     Status: Abnormal   Collection Time: 08/21/18  3:35 AM  Result Value Ref Range   WBC 13.4 (H) 4.0 - 10.5 K/uL   RBC 4.30 3.87 - 5.11 MIL/uL   Hemoglobin 11.0 (L) 12.0 - 15.0 g/dL   HCT 34.4 (L) 36.0 - 46.0 %   MCV 80.0 80.0 - 100.0 fL   MCH 25.6 (L) 26.0 - 34.0 pg   MCHC 32.0 30.0 - 36.0 g/dL   RDW 14.2 11.5 - 15.5 %   Platelets 419 (H) 150 - 400 K/uL   nRBC 0.0 0.0 - 0.2 %    Comment: Performed at [facility], [place] [address] [place], [place] [address]  Basic metabolic panel     Status: Abnormal   Collection Time: 08/21/18  3:35 AM  Result Value Ref Range   Sodium 139 135 - 145 mmol/L   Potassium 3.1 (L) 3.5 - 5.1 mmol/L   Chloride 103 98 - 111 mmol/L   CO2 24 22 - 32  mmol/L   Glucose, Bld 118 (H) 70 - 99 mg/dL   BUN <5 (L) 6 - 20 mg/dL   Creatinine, Ser 0.69 0.44 - 1.00 mg/dL   Calcium 8.6 (L) 8.9 - 10.3 mg/dL   GFR calc non Af Amer >60 >60 mL/min   GFR calc Af Amer >60 >60 mL/min   Anion gap 12 5 - 15    Comment: Performed at [facility] [address] [place], [place] [address]    Ct Abdomen Pelvis W Contrast  Result Date: 08/21/2018 CLINICAL DATA:  Abdominal pain suspected diverticulitis. EXAM: CT ABDOMEN AND PELVIS WITH CONTRAST TECHNIQUE: Multidetector CT imaging of the abdomen and pelvis was performed using the standard protocol following bolus administration of intravenous contrast. CONTRAST:  174mL OMNIPAQUE IOHEXOL 300 MG/ML  SOLN COMPARISON:  Body CT 08/16/2018 and 05/06/2018 FINDINGS: Lower chest: Hyperventilatory changes in the right lung base. Hepatobiliary: No focal liver abnormality is seen. No gallstones, gallbladder wall thickening, or biliary dilatation. Pancreas: Unremarkable. No pancreatic ductal dilatation or surrounding inflammatory changes. Spleen: Normal in size without focal abnormality. Adrenals/Urinary Tract: Adrenal glands are unremarkable. Kidneys are normal, without renal calculi, focal lesion, or hydronephrosis. Bladder is unremarkable. Stomach/Bowel: Normal appearance of the stomach and small bowel. Scattered colonic diverticulosis. The area of presumed diverticulitis in the sigmoid colon demonstrates interval worsening inflammatory changes. There is a relatively long segment of circumferential marked mucosal thickening of the involved sigmoid colon with an intramural area of hypoattenuation which measures 2.7 x 2.1 x 1.9 cm, likely representing an abscess. The appendix is long and crosses the midline with extension to the left lower quadrant. The distal part of the appendix, which abuts the abnormal sigmoid colon, is thickened to 11 mm, fluid-filled and with indistinct borders, consistent with tip appendicitis, likely  secondary to the inflammatory process of the sigmoid colon. There is marked pericolonic inflammatory stranding. Interval development of adjacent mesenteric lymphadenopathy. Small amount of free fluid tracks into the left adnexa. Vascular/Lymphatic: Likely reactive central mesenteric lymphadenopathy. Reproductive: Normal appearance of the uterus and the right adnexa. Inflammatory fluid within the left adnexa. Other: Known periumbilical fat and fluid containing anterior abdominal wall hernia. Musculoskeletal: Post left hip arthroplasty, stable. IMPRESSION: Worsening of the presumed sigmoid colon diverticulitis with marked thickening of the involved sigmoid  colon, pericolonic inflammatory changes, 2.7 cm intramural abscess, and secondary tip appendicitis. Small amount of inflammatory fluid tracks into the left adnexa. Likely reactive central mesenteric lymphadenopathy. These results were called by telephone at the time of interpretation on 08/21/2018 at 4:54 pm to Dr. Roxan Hockey , who verbally acknowledged these results. Electronically Signed   By: Fidela Salisbury M.D.   On: 08/21/2018 17:13    Review of Systems  Gastrointestinal: Positive for abdominal pain and nausea.  All other systems reviewed and are negative.  Blood pressure (!) 161/88, pulse 98, temperature 98.9 F (37.2 C), temperature source Oral, resp. rate 16, height 5\' 4"  (1.626 m), weight 97.1 kg, last menstrual period 02/03/2018, SpO2 91 %. Physical Exam  Constitutional: She is oriented to person, place, and time. She appears well-developed and well-nourished.  HENT:  Head: Normocephalic and atraumatic.  Eyes: Pupils are equal, round, and reactive to light. EOM are normal.  Neck: Normal range of motion.  Cardiovascular: Normal rate and regular rhythm.  Respiratory: Effort normal.  GI: There is tenderness in the left lower quadrant. There is no rebound.  Musculoskeletal: Normal range of motion.  Neurological: She is alert and  oriented to person, place, and time.  Skin: Skin is warm and dry.  Psychiatric: She has a normal mood and affect. Her behavior is normal.    Assessment/Plan: Complex acute diverticulitis not responding to medical management  The patient has been on 3 different antibiotics and 3 days.'s difficult for me to tell antibiotic course.  She has been on outpatient management and now seems to be failing inpatient management.  We will reassess in a.m.  If she is no better she may benefit from consideration of sigmoid colectomy with diversion.  I discussed colostomy and ileostomy with her.  Since she is not getting better on medical management surgical intervention with diversion is more likely.  Dr. Grandville Silos to evaluate in the morning.  I discussed this with the patient and her family at the bedside tonight.  She has no acute surgical indication at this point time we will continue to monitor.  She needs to be n.p.o. for now.  Patient Active Problem List   Diagnosis Date Noted  . Acute diverticulitis 08/18/2018  . Vaginal spotting 08/18/2018  . GERD (gastroesophageal reflux disease)   . Asthma   . 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 Continuous tobacco abuse 05/02/2016  . 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 Status post tonsillectomy 05/02/2016  . Dehydration 10/12/2013  . 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 Lupus (systemic lupus erythematosus) ([place]) 09/05/2012  . Avascular necrosis of hip ([place]) 06/14/2012    [place] A Channing Savich 08/21/2018, 5:57 PM

## 2018-08-21 NOTE — Progress Notes (Signed)
Patient Demographics:    D[place], is a 52 y.o. female, DOB - Jun 10, 1966, RJJ:[id number]  Admit date - 08/18/2018   Admitting Physician No admitting provider for patient encounter.  Outpatient Primary MD for the patient is [facility], [place] - 3   Chief Complaint  Patient presents with  . Emesis  . Vaginal Bleeding        Subjective:    Dana [place] today has no fevers, no emesis,  No chest pain, c/o  of increased abdominal pain, husband at bedside,  Assessment  & Plan :    Principal Problem:   Acute diverticulitis Active Problems:   GERD (gastroesophageal reflux disease)   Asthma   Vaginal spotting  Brief Summary:- 52 year old past medical history relevant for ITP, lupus, GERD and asthma along with a known umbilical hernia which she is awaiting surgical repair with central [place] surgery admitted on 08/18/2018 with worsening sigmoid diverticulitis after failing oral Flagyl/Cipro combination from 08/16/2018 to 08/18/2018  Plan: 1)Acute sigmoid Diverticulitis--- overall much worse clinically and radiologically, please note that patient failed to improve on Cipro and Flagyl combination from 1129 through 08/18/18 , patient was then treated with IV Zosyn from 08/18/2018 through 08/21/2018, discussed with on-call for the ID physician Dr. Linus Salmons... We will treat empirically with IV Invanz 1 g every 24 hours.... WBC trending up to 13.4 this time, surgical consult requested from Dr. Brantley Stage due to concerns about worsening sigmoid diverticulitis with  intramural abscess,  possible involvement of the appendix as well as left adnexa involvement... Discussed with general radiologist and interventional radiologist on 08/21/2018 apparently the 2.7 cm sigmoid abscess is intramural and not amenable to IR drainage... Official general surgery consult and decision on possible appendectomy  requested and pending  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  4) periumbilical hernia----not very symptomatic at this time, awaiting surgical consult as outlined above #1  5)Asthma--- stable, no acute exacerbation, continue bronchodilators  6)H/o LUpus--- not symptomatic at this time  7)Preop----keep patient n.p.o. just in case general surgery decides to intervene surgically, stop subcu Lovenox and use teds and SCDs for now  Disposition/Need for in-Hospital Stay- patient unable to be discharged at this time due to worsening sigmoid diverticulitis requiring IV Invanz (failed outpatient oral antibiotics and failed IV Zosyn), may need surgical intervention  Code Status : Full  Disposition Plan  : TBD  Consults  :  Phone Consult with ID/Gen Surgery  DVT Prophylaxis  :    - SCDs *  Lab Results  Component Value Date   PLT 419 (H) 08/21/2018   Inpatient Medications  Scheduled Meds: . fluticasone furoate-vilanterol  1 puff Inhalation Daily  . pantoprazole  40 mg Oral Daily   Continuous Infusions: . dextrose 5 % and 0.45 % NaCl with KCl 20 mEq/L 100 mL/hr at 08/21/18 1732  . ertapenem     PRN Meds:.acetaminophen **OR** acetaminophen, HYDROmorphone (DILAUDID) injection, ondansetron **OR** ondansetron (ZOFRAN) IV, oxyCODONE-acetaminophen    Anti-infectives (From admission, onward)   Start     Dose/Rate Route Frequency Ordered Stop   08/21/18 1845  ertapenem (INVANZ) 1,000 mg in sodium  chloride 0.9 % 100 mL IVPB     1 g 200 mL/hr over 30 Minutes Intravenous Every 24 hours 08/21/18 1730     08/20/18 1830  piperacillin-tazobactam (ZOSYN) IVPB 3.375 g  Status:  Discontinued     3.375 g 12.5 mL/hr over 240 Minutes Intravenous Every 8 hours 08/20/18 1729 08/21/18 1730   08/18/18 2300  ciprofloxacin (CIPRO) IVPB 400 mg  Status:   Discontinued     400 mg 200 mL/hr over 60 Minutes Intravenous Every 12 hours 08/18/18 1302 08/20/18 1649   08/18/18 1500  metroNIDAZOLE (FLAGYL) IVPB 500 mg  Status:  Discontinued     500 mg 100 mL/hr over 60 Minutes Intravenous Every 8 hours 08/18/18 1302 08/20/18 1649   08/18/18 1100  ciprofloxacin (CIPRO) IVPB 400 mg     400 mg 200 mL/hr over 60 Minutes Intravenous  Once 08/18/18 1047 08/18/18 1239   08/18/18 1100  metroNIDAZOLE (FLAGYL) IVPB 500 mg     500 mg 100 mL/hr over 60 Minutes Intravenous  Once 08/18/18 1047 08/18/18 1344        Objective:   Vitals:   08/20/18 2220 08/21/18 0537 08/21/18 0826 08/21/18 1240  BP: 139/77 (!) 141/79  (!) 161/88  Pulse: 99 98  98  Resp:  16    Temp: 99.7 F (37.6 C) 98.9 F (37.2 C)  98.9 F (37.2 C)  TempSrc: Oral Oral  Oral  SpO2:  98% 99% 91%  Weight:      Height:        Wt Readings from Last 3 Encounters:  08/18/18 97.1 kg  08/16/18 97.1 kg  06/10/18 96.2 kg     Intake/Output Summary (Last 24 hours) at 08/21/2018 1804 Last data filed at 08/21/2018 0747 Gross per 24 hour  Intake 738.17 ml  Output -  Net 738.17 ml   Physical Exam Patient is examined daily including today on 08/21/18 , exams remain the same as of yesterday except that has changed   Gen:- Awake Alert, appears uncomfortable HEENT:- [place].AT, No sclera icterus Neck-Supple Neck,No JVD,.  Lungs-  CTAB , fair symmetrical air movement CV- S1, S2 normal, regular  Abd-  +ve B.Sounds, Abd Soft, lower abdominal tenderness especially in left lower quadrant, periumbilical hernia noted Extremity/Skin:- No  edema, pedal pulses present  Psych-affect is appropriate, oriented x3 Neuro-no new focal deficits, no tremors   Data Review:   Micro Results Recent Results (from the past 240 hour(s))  Wet prep, genital     Status: Abnormal   Collection Time: 08/18/18 10:30 AM  Result Value Ref Range Status   Yeast Wet Prep HPF POC NONE SEEN NONE SEEN Final   Trich, Wet Prep  NONE SEEN NONE SEEN Final   Clue Cells Wet Prep HPF POC NONE SEEN NONE SEEN Final   WBC, Wet Prep HPF POC FEW (A) NONE SEEN Final   Sperm NONE SEEN  Final    Comment: Performed at [facility], [address] [place], [place] [address]    Radiology Reports Ct Abdomen Pelvis W Contrast  Result Date: 08/21/2018 CLINICAL DATA:  Abdominal pain suspected diverticulitis. EXAM: CT ABDOMEN AND PELVIS WITH CONTRAST TECHNIQUE: Multidetector CT imaging of the abdomen and pelvis was performed using the standard protocol following bolus administration of intravenous contrast. CONTRAST:  172mL OMNIPAQUE IOHEXOL 300 MG/ML  SOLN COMPARISON:  Body CT 08/16/2018 and 05/06/2018 FINDINGS: Lower chest: Hyperventilatory changes in the right lung base. Hepatobiliary: No focal liver abnormality is seen. No gallstones, gallbladder  wall thickening, or biliary dilatation. Pancreas: Unremarkable. No pancreatic ductal dilatation or surrounding inflammatory changes. Spleen: Normal in size without focal abnormality. Adrenals/Urinary Tract: Adrenal glands are unremarkable. Kidneys are normal, without renal calculi, focal lesion, or hydronephrosis. Bladder is unremarkable. Stomach/Bowel: Normal appearance of the stomach and small bowel. Scattered colonic diverticulosis. The area of presumed diverticulitis in the sigmoid colon demonstrates interval worsening inflammatory changes. There is a relatively long segment of circumferential marked mucosal thickening of the involved sigmoid colon with an intramural area of hypoattenuation which measures 2.7 x 2.1 x 1.9 cm, likely representing an abscess. The appendix is long and crosses the midline with extension to the left lower quadrant. The distal part of the appendix, which abuts the abnormal sigmoid colon, is thickened to 11 mm, fluid-filled and with indistinct borders, consistent with tip appendicitis, likely secondary to the inflammatory process of the sigmoid colon. There is marked  pericolonic inflammatory stranding. Interval development of adjacent mesenteric lymphadenopathy. Small amount of free fluid tracks into the left adnexa. Vascular/Lymphatic: Likely reactive central mesenteric lymphadenopathy. Reproductive: Normal appearance of the uterus and the right adnexa. Inflammatory fluid within the left adnexa. Other: Known periumbilical fat and fluid containing anterior abdominal wall hernia. Musculoskeletal: Post left hip arthroplasty, stable. IMPRESSION: Worsening of the presumed sigmoid colon diverticulitis with marked thickening of the involved sigmoid colon, pericolonic inflammatory changes, 2.7 cm intramural abscess, and secondary tip appendicitis. Small amount of inflammatory fluid tracks into the left adnexa. Likely reactive central mesenteric lymphadenopathy. These results were called by telephone at the time of interpretation on 08/21/2018 at 4:54 pm to Dr. Roxan Hockey , who verbally acknowledged these results. Electronically Signed   By: Fidela Salisbury M.D.   On: 08/21/2018 17:13   Ct Abdomen Pelvis W Contrast  Result Date: 08/16/2018 CLINICAL DATA:  Lower abdominal pain and no midline hernia. Some low back pain as hernia repair 2014 but states hernia has recurred. Pain worse over the past 3 days. EXAM: CT ABDOMEN AND PELVIS WITH CONTRAST TECHNIQUE: Multidetector CT imaging of the abdomen and pelvis was performed using the standard protocol following bolus administration of intravenous contrast. CONTRAST:  143mL OMNIPAQUE IOHEXOL 300 MG/ML  SOLN COMPARISON:  05/06/2018 and 02/01/2018 FINDINGS: Lower chest: Calcified granuloma over the left lower lobe. Mild cardiomegaly. Hepatobiliary: Gallbladder, liver and biliary tree are normal. Pancreas: Normal. Spleen: Normal. Adrenals/Urinary Tract: Adrenal glands are normal. Kidneys are normal in size without hydronephrosis or nephrolithiasis. Ureters and bladder are normal. Stomach/Bowel: Stomach and small bowel are normal.  Appendix is within normal. Diverticulosis of the colon. There is inflammatory change adjacent a short segment of the thick-walled sigmoid colon in the left lower quadrant compatible with acute diverticulitis. There is no perforation or abscess. Vascular/Lymphatic: Normal. Reproductive: Within normal. Other: Periumbilical hernia containing peritoneal fat unchanged. Tiny amount of free pelvic fluid. Musculoskeletal: Left hip arthroplasty intact. Mild degenerate change of the right hip. IMPRESSION: Colonic diverticulosis with mild acute diverticulitis involving a short segment of the sigmoid colon in the left lower quadrant. No perforation or abscess. Known umbilical hernia containing peritoneal fat unchanged. Electronically Signed   By: Marin Olp M.D.   On: 08/16/2018 20:11   Dg Abd Acute W/chest  Result Date: 08/18/2018 CLINICAL DATA:  Abdominal pain and vomiting EXAM: DG ABDOMEN ACUTE W/ 1V CHEST COMPARISON:  12/25/2017 chest radiograph. 05/06/2017 abdominal radiograph FINDINGS: Stable cardiomediastinal silhouette with cardiomegaly. No pneumothorax. No pleural effusion. Mild bibasilar atelectasis. No pulmonary edema. No consolidative airspace disease. No dilated small bowel loops  or significant air-fluid levels. Mild colonic stool. No evidence of pneumatosis or pneumoperitoneum. No radiopaque nephrolithiasis. Left total hip arthroplasty. Lower lumbar spondylosis. IMPRESSION: 1. Stable mild cardiomegaly without pulmonary edema. 2. Mild bibasilar atelectasis. 3. Nonobstructive bowel gas pattern. Electronically Signed   By: Ilona Sorrel M.D.   On: 08/18/2018 11:54     CBC Recent Labs  Lab 08/16/18 1830 08/18/18 1017 08/19/18 0152 08/20/18 0222 08/21/18 0335  WBC 10.5 11.9* 12.0* 11.4* 13.4*  HGB 11.9* 12.0 11.0* 11.0* 11.0*  HCT 39.7 39.5 34.6* 36.7 34.4*  PLT 377 462* 383 429* 419*  MCV 83.6 82.3 81.0 82.1 80.0  MCH 25.1* 25.0* 25.8* 24.6* 25.6*  MCHC 30.0 30.4 31.8 30.0 32.0  RDW 15.0 14.6  14.6 14.4 14.2  LYMPHSABS 2.1 2.9  --   --   --   MONOABS 0.8 1.1*  --   --   --   EOSABS 0.4 0.3  --   --   --   BASOSABS 0.0 0.0  --   --   --     Chemistries  Recent Labs  Lab 08/16/18 1830 08/18/18 1017 08/19/18 0152 08/21/18 0335  NA 138 137 136 139  K 3.8 3.9 3.9 3.1*  CL 110 103 102 103  CO2 22 25 26 24   GLUCOSE 118* 97 103* 118*  BUN 10 6 <5* <5*  CREATININE 0.88 0.69 0.70 0.69  CALCIUM 9.1 9.0 8.6* 8.6*  AST 15  --   --   --   ALT 13  --   --   --   ALKPHOS 68  --   --   --   BILITOT 0.3  --   --   --    ------------------------------------------------------------------------------------------------------------------ No results for input(s): CHOL, HDL, LDLCALC, TRIG, CHOLHDL, LDLDIRECT in the last 72 hours.  No results found for: HGBA1C ------------------------------------------------------------------------------------------------------------------ No results for input(s): TSH, T4TOTAL, T3FREE, THYROIDAB in the last 72 hours.  Invalid input(s): FREET3 ------------------------------------------------------------------------------------------------------------------ No results for input(s): VITAMINB12, FOLATE, FERRITIN, TIBC, IRON, RETICCTPCT in the last 72 hours.  Coagulation profile No results for input(s): INR, PROTIME in the last 168 hours.  No results for input(s): DDIMER in the last 72 hours.  Cardiac Enzymes No results for input(s): CKMB, TROPONINI, MYOGLOBIN in the last 168 hours.  Invalid input(s): CK ------------------------------------------------------------------------------------------------------------------ No results found for: BNP   Roxan Hockey M.D on 08/21/2018 at 6:04 PM  Pager---[phone] Go to [email or web address] - password [email or web address] for contact info  [facility] - Office  [phone]

## 2018-08-22 LAB — BASIC METABOLIC PANEL
Anion gap: 10 (ref 5–15)
BUN: <5 mg/dL — ABNORMAL LOW (ref 6–20)
CO2: 24 mmol/L (ref 22–32)
Calcium: 8.9 mg/dL (ref 8.9–10.3)
Chloride: 104 mmol/L (ref 98–111)
Creatinine, Ser: 0.69 mg/dL (ref 0.44–1.00)
GFR calc Af Amer: >60 mL/min (ref >60)
GFR calc non Af Amer: >60 mL/min (ref >60)
Glucose, Bld: 122 mg/dL — ABNORMAL HIGH (ref 70–99)
Potassium: 3.6 mmol/L (ref 3.5–5.1)
Sodium: 138 mmol/L (ref 135–145)

## 2018-08-22 LAB — CBC
HCT: 35.9 % — ABNORMAL LOW (ref 36.0–46.0)
HEMOGLOBIN: 11.3 g/dL — AB (ref 12.0–15.0)
MCH: 25.4 pg — ABNORMAL LOW (ref 26.0–34.0)
MCHC: 31.5 g/dL (ref 30.0–36.0)
MCV: 80.7 fL (ref 80.0–100.0)
Platelets: 470 10*3/uL — ABNORMAL HIGH (ref 150–400)
RBC: 4.45 MIL/uL (ref 3.87–5.11)
RDW: 14.2 % (ref 11.5–15.5)
WBC: 11.2 10*3/uL — ABNORMAL HIGH (ref 4.0–10.5)
nRBC: 0 % (ref 0.0–0.2)

## 2018-08-22 MED ORDER — FLORANEX PO PACK
1.0000 g | PACK | Freq: Three times a day (TID) | ORAL | Status: DC
  Administered 2018-08-22 – 2018-08-23 (×4): 1 g via ORAL
  Filled 2018-08-22 (×5): qty 1

## 2018-08-22 NOTE — Progress Notes (Signed)
Patient Demographics:    Dana Kennedy, is a 52 y.o. female, DOB - 1966-09-09, UTM:[id number]  Admit date - 08/18/2018   Admitting Physician No admitting provider for patient encounter.  Outpatient Primary MD for the patient is [facility], [place] - 4   Chief Complaint  Patient presents with  . Emesis  . Vaginal Bleeding        Subjective:    Dana Kennedy today has no fevers, no emesis,  No chest pain, c/o  of increased abdominal pain, husband at bedside,  Assessment  & Plan :    Principal Problem:   Acute diverticulitis Active Problems:   GERD (gastroesophageal reflux disease)   Asthma   Vaginal spotting  Brief Summary:- 52 year old past medical history relevant for ITP, lupus, GERD and asthma along with a known umbilical hernia which she is awaiting surgical repair with [facility] [place] [facility] admitted on 08/18/2018 with worsening sigmoid diverticulitis after failing oral Flagyl/Cipro combination from 08/16/2018 to 08/18/2018  Plan: 1)Acute sigmoid Diverticulitis--- abdominal pain and white count slightly improving since starting a once on 08/21/2018, please note that patient failed to improve on Cipro and Flagyl combination from 1129 through 08/18/18 , patient was then treated with IV Zosyn from 08/18/2018 through 08/21/2018, previously discussed discussed with on-call for the ID physician Dr. Linus Salmons on 08/21/2018... Continue IV Invanz 1 g every 24 hours....  surgical consult requested due to concerns about worsening sigmoid diverticulitis with  intramural abscess,  possible involvement of the appendix as well as left adnexa involvement... Discussed with general radiologist and interventional radiologist on 08/21/2018 apparently the 2.7 cm sigmoid abscess is intramural and not amenable to IR drainage... Discussed with general surgery on 08/22/2018 at this time they want to hold off  on surgical intervention as long as she improves on IV Invanz   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  4) periumbilical hernia----not very symptomatic at this time, awaiting surgical consult as outlined above #1  5)Asthma--- stable, no acute exacerbation, continue bronchodilators  6)H/o LUpus--- not symptomatic at this time     Disposition/Need for in-Hospital Stay- patient unable to be discharged at this time due to worsening sigmoid diverticulitis requiring IV Invanz (failed outpatient oral antibiotics and failed IV Zosyn), may need surgical intervention  Code Status : Full  Disposition Plan  : TBD  Consults  :  Phone Consult with ID/Gen Surgery  DVT Prophylaxis  :    - SCDs *  Lab Results  Component Value Date   PLT 470 (H) 08/22/2018   Inpatient Medications  Scheduled Meds: . fluticasone furoate-vilanterol  1 puff Inhalation Daily  . lactobacillus  1 g Oral TID WC  . pantoprazole  40 mg Oral Daily   Continuous Infusions: . dextrose 5 % and 0.45 % NaCl with KCl 20 mEq/L 100 mL/hr at 08/22/18 1409  . ertapenem 1,000 mg (08/22/18 1817)   PRN Meds:.acetaminophen **OR** acetaminophen, HYDROmorphone (DILAUDID) injection, ondansetron **OR** ondansetron (ZOFRAN) IV, oxyCODONE-acetaminophen    Anti-infectives (From admission, onward)   Start     Dose/Rate Route Frequency Ordered Stop   08/21/18 1845  ertapenem (INVANZ) 1,000  mg in sodium chloride 0.9 % 100 mL IVPB     1 g 200 mL/hr over 30 Minutes Intravenous Every 24 hours 08/21/18 1730     08/20/18 1830  piperacillin-tazobactam (ZOSYN) IVPB 3.375 g  Status:  Discontinued     3.375 g 12.5 mL/hr over 240 Minutes Intravenous Every 8 hours 08/20/18 1729 08/21/18 1730   08/18/18 2300  ciprofloxacin (CIPRO) IVPB 400 mg  Status:  Discontinued     400 mg 200 mL/hr over  60 Minutes Intravenous Every 12 hours 08/18/18 1302 08/20/18 1649   08/18/18 1500  metroNIDAZOLE (FLAGYL) IVPB 500 mg  Status:  Discontinued     500 mg 100 mL/hr over 60 Minutes Intravenous Every 8 hours 08/18/18 1302 08/20/18 1649   08/18/18 1100  ciprofloxacin (CIPRO) IVPB 400 mg     400 mg 200 mL/hr over 60 Minutes Intravenous  Once 08/18/18 1047 08/18/18 1239   08/18/18 1100  metroNIDAZOLE (FLAGYL) IVPB 500 mg     500 mg 100 mL/hr over 60 Minutes Intravenous  Once 08/18/18 1047 08/18/18 1344        Objective:   Vitals:   08/21/18 1240 08/21/18 2119 08/22/18 0609 08/22/18 1535  BP: (!) 161/88 (!) 144/83 (!) 137/92 (!) 137/99  Pulse: 98 98 95 88  Resp:  16 16 17   Temp: 98.9 F (37.2 C) (!) 100.7 F (38.2 C) 98.3 F (36.8 C) 98.7 F (37.1 C)  TempSrc: Oral Oral Oral Oral  SpO2: 91% 99% 96% 96%  Weight:      Height:        Wt Readings from Last 3 Encounters:  08/18/18 97.1 kg  08/16/18 97.1 kg  06/10/18 96.2 kg     Intake/Output Summary (Last 24 hours) at 08/22/2018 1829 Last data filed at 08/22/2018 1533 Gross per 24 hour  Intake 710 ml  Output -  Net 710 ml   Physical Exam Patient is examined daily including today on 08/22/18 , exams remain the same as of yesterday except that has changed   Gen:- Awake Alert, appears uncomfortable HEENT:- [place].AT, No sclera icterus Neck-Supple Neck,No JVD,.  Lungs-  CTAB , fair symmetrical air movement CV- S1, S2 normal, regular  Abd-  +ve B.Sounds, Abd Soft, lower abdominal tenderness is slightly improving especially in left lower quadrant, periumbilical hernia noted Extremity/Skin:- No  edema, pedal pulses present  Psych-affect is appropriate, oriented x3 Neuro-no new focal deficits, no tremors   Data Review:   Micro Results Recent Results (from the past 240 hour(s))  Wet prep, genital     Status: Abnormal   Collection Time: 08/18/18 10:30 AM  Result Value Ref Range Status   Yeast Wet Prep HPF POC NONE SEEN NONE SEEN  Final   Trich, Wet Prep NONE SEEN NONE SEEN Final   Clue Cells Wet Prep HPF POC NONE SEEN NONE SEEN Final   WBC, Wet Prep HPF POC FEW (A) NONE SEEN Final   Sperm NONE SEEN  Final    Comment: Performed at [facility], [address], [place], [place] [address]    Radiology Reports Ct Abdomen Pelvis W Contrast  Result Date: 08/21/2018 CLINICAL DATA:  Abdominal pain suspected diverticulitis. EXAM: CT ABDOMEN AND PELVIS WITH CONTRAST TECHNIQUE: Multidetector CT imaging of the abdomen and pelvis was performed using the standard protocol following bolus administration of intravenous contrast. CONTRAST:  12mL OMNIPAQUE IOHEXOL 300 MG/ML  SOLN COMPARISON:  Body CT 08/16/2018 and 05/06/2018 FINDINGS: Lower chest: Hyperventilatory changes in the right  lung base. Hepatobiliary: No focal liver abnormality is seen. No gallstones, gallbladder wall thickening, or biliary dilatation. Pancreas: Unremarkable. No pancreatic ductal dilatation or surrounding inflammatory changes. Spleen: Normal in size without focal abnormality. Adrenals/Urinary Tract: Adrenal glands are unremarkable. Kidneys are normal, without renal calculi, focal lesion, or hydronephrosis. Bladder is unremarkable. Stomach/Bowel: Normal appearance of the stomach and small bowel. Scattered colonic diverticulosis. The area of presumed diverticulitis in the sigmoid colon demonstrates interval worsening inflammatory changes. There is a relatively long segment of circumferential marked mucosal thickening of the involved sigmoid colon with an intramural area of hypoattenuation which measures 2.7 x 2.1 x 1.9 cm, likely representing an abscess. The appendix is long and crosses the midline with extension to the left lower quadrant. The distal part of the appendix, which abuts the abnormal sigmoid colon, is thickened to 11 mm, fluid-filled and with indistinct borders, consistent with tip appendicitis, likely secondary to the inflammatory process of the  sigmoid colon. There is marked pericolonic inflammatory stranding. Interval development of adjacent mesenteric lymphadenopathy. Small amount of free fluid tracks into the left adnexa. Vascular/Lymphatic: Likely reactive central mesenteric lymphadenopathy. Reproductive: Normal appearance of the uterus and the right adnexa. Inflammatory fluid within the left adnexa. Other: Known periumbilical fat and fluid containing anterior abdominal wall hernia. Musculoskeletal: Post left hip arthroplasty, stable. IMPRESSION: Worsening of the presumed sigmoid colon diverticulitis with marked thickening of the involved sigmoid colon, pericolonic inflammatory changes, 2.7 cm intramural abscess, and secondary tip appendicitis. Small amount of inflammatory fluid tracks into the left adnexa. Likely reactive central mesenteric lymphadenopathy. These results were called by telephone at the time of interpretation on 08/21/2018 at 4:54 pm to Dr. Roxan Hockey , who verbally acknowledged these results. Electronically Signed   By: Fidela Salisbury M.D.   On: 08/21/2018 17:13   Ct Abdomen Pelvis W Contrast  Result Date: 08/16/2018 CLINICAL DATA:  Lower abdominal pain and no midline hernia. Some low back pain as hernia repair 2014 but states hernia has recurred. Pain worse over the past 3 days. EXAM: CT ABDOMEN AND PELVIS WITH CONTRAST TECHNIQUE: Multidetector CT imaging of the abdomen and pelvis was performed using the standard protocol following bolus administration of intravenous contrast. CONTRAST:  132mL OMNIPAQUE IOHEXOL 300 MG/ML  SOLN COMPARISON:  05/06/2018 and 02/01/2018 FINDINGS: Lower chest: Calcified granuloma over the left lower lobe. Mild cardiomegaly. Hepatobiliary: Gallbladder, liver and biliary tree are normal. Pancreas: Normal. Spleen: Normal. Adrenals/Urinary Tract: Adrenal glands are normal. Kidneys are normal in size without hydronephrosis or nephrolithiasis. Ureters and bladder are normal. Stomach/Bowel: Stomach  and small bowel are normal. Appendix is within normal. Diverticulosis of the colon. There is inflammatory change adjacent a short segment of the thick-walled sigmoid colon in the left lower quadrant compatible with acute diverticulitis. There is no perforation or abscess. Vascular/Lymphatic: Normal. Reproductive: Within normal. Other: Periumbilical hernia containing peritoneal fat unchanged. Tiny amount of free pelvic fluid. Musculoskeletal: Left hip arthroplasty intact. Mild degenerate change of the right hip. IMPRESSION: Colonic diverticulosis with mild acute diverticulitis involving a short segment of the sigmoid colon in the left lower quadrant. No perforation or abscess. Known umbilical hernia containing peritoneal fat unchanged. Electronically Signed   By: Marin Olp M.D.   On: 08/16/2018 20:11   Dg Abd Acute W/chest  Result Date: 08/18/2018 CLINICAL DATA:  Abdominal pain and vomiting EXAM: DG ABDOMEN ACUTE W/ 1V CHEST COMPARISON:  12/25/2017 chest radiograph. 05/06/2017 abdominal radiograph FINDINGS: Stable cardiomediastinal silhouette with cardiomegaly. No pneumothorax. No pleural effusion. Mild bibasilar atelectasis.  No pulmonary edema. No consolidative airspace disease. No dilated small bowel loops or significant air-fluid levels. Mild colonic stool. No evidence of pneumatosis or pneumoperitoneum. No radiopaque nephrolithiasis. Left total hip arthroplasty. Lower lumbar spondylosis. IMPRESSION: 1. Stable mild cardiomegaly without pulmonary edema. 2. Mild bibasilar atelectasis. 3. Nonobstructive bowel gas pattern. Electronically Signed   By: Ilona Sorrel M.D.   On: 08/18/2018 11:54     CBC Recent Labs  Lab 08/16/18 1830 08/18/18 1017 08/19/18 0152 08/20/18 0222 08/21/18 0335 08/22/18 1020  WBC 10.5 11.9* 12.0* 11.4* 13.4* 11.2*  HGB 11.9* 12.0 11.0* 11.0* 11.0* 11.3*  HCT 39.7 39.5 34.6* 36.7 34.4* 35.9*  PLT 377 462* 383 429* 419* 470*  MCV 83.6 82.3 81.0 82.1 80.0 80.7  MCH 25.1*  25.0* 25.8* 24.6* 25.6* 25.4*  MCHC 30.0 30.4 31.8 30.0 32.0 31.5  RDW 15.0 14.6 14.6 14.4 14.2 14.2  LYMPHSABS 2.1 2.9  --   --   --   --   MONOABS 0.8 1.1*  --   --   --   --   EOSABS 0.4 0.3  --   --   --   --   BASOSABS 0.0 0.0  --   --   --   --     Chemistries  Recent Labs  Lab 08/16/18 1830 08/18/18 1017 08/19/18 0152 08/21/18 0335 08/22/18 1020  NA 138 137 136 139 138  K 3.8 3.9 3.9 3.1* 3.6  CL 110 103 102 103 104  CO2 22 25 26 24 24   GLUCOSE 118* 97 103* 118* 122*  BUN 10 6 <5* <5* <5*  CREATININE 0.88 0.69 0.70 0.69 0.69  CALCIUM 9.1 9.0 8.6* 8.6* 8.9  AST 15  --   --   --   --   ALT 13  --   --   --   --   ALKPHOS 68  --   --   --   --   BILITOT 0.3  --   --   --   --    -------------------------------------------  Roxan Hockey M.D on 08/22/2018 at 6:29 PM  Pager---[phone] Go to [email or web address] - [email or web address] for contact info  [facility] - Office  [phone]

## 2018-08-23 LAB — BASIC METABOLIC PANEL
Anion gap: 11 (ref 5–15)
CALCIUM: 8.7 mg/dL — AB (ref 8.9–10.3)
CO2: 23 mmol/L (ref 22–32)
CREATININE: 0.78 mg/dL (ref 0.44–1.00)
Chloride: 104 mmol/L (ref 98–111)
GFR calc Af Amer: >60 mL/min (ref >60)
GFR calc non Af Amer: >60 mL/min (ref >60)
Glucose, Bld: 117 mg/dL — ABNORMAL HIGH (ref 70–99)
Potassium: 3.5 mmol/L (ref 3.5–5.1)
Sodium: 138 mmol/L (ref 135–145)

## 2018-08-23 LAB — CBC
HCT: 35.7 % — ABNORMAL LOW (ref 36.0–46.0)
Hemoglobin: 11.1 g/dL — ABNORMAL LOW (ref 12.0–15.0)
MCH: 24.9 pg — ABNORMAL LOW (ref 26.0–34.0)
MCHC: 31.1 g/dL (ref 30.0–36.0)
MCV: 80.2 fL (ref 80.0–100.0)
Platelets: 515 10*3/uL — ABNORMAL HIGH (ref 150–400)
RBC: 4.45 MIL/uL (ref 3.87–5.11)
RDW: 14.2 % (ref 11.5–15.5)
WBC: 9.3 10*3/uL (ref 4.0–10.5)
nRBC: 0 % (ref 0.0–0.2)

## 2018-08-23 MED ORDER — WHITE PETROLATUM EX OINT
TOPICAL_OINTMENT | CUTANEOUS | Status: AC
  Administered 2018-08-23: 0.2
  Filled 2018-08-23: qty 28.35

## 2018-08-23 MED ORDER — RISAQUAD PO CAPS
1.0000 | ORAL_CAPSULE | Freq: Every day | ORAL | Status: DC
  Administered 2018-08-23 – 2018-08-25 (×3): 1 via ORAL
  Filled 2018-08-23 (×3): qty 1

## 2018-08-23 NOTE — Progress Notes (Addendum)
Patient Demographics:    Dana Kennedy, is a 52 y.o. female, DOB - 17-Jun-1966, WUX:[id number]  Admit date - 08/18/2018   Admitting Physician No admitting provider for patient encounter.  Outpatient Primary MD for the patient is Premier, Burt - 5   Chief Complaint  Patient presents with  . Emesis  . Vaginal Bleeding        Subjective:    Dana Kennedy today has no fevers, no emesis,  No chest pain, abd pain is less severe... Wants to eat more  Assessment  & Plan :    Principal Problem:   Acute diverticulitis Active Problems:   GERD (gastroesophageal reflux disease)   Asthma   Vaginal spotting  Brief Summary:- 52 year old past medical history relevant for ITP, lupus, GERD and asthma along with a known umbilical hernia which she is awaiting surgical repair with [facility] admitted on 08/18/2018 with worsening sigmoid diverticulitis after failing oral Flagyl/Cipro combination from 08/16/2018 to 08/18/2018  Plan: 1)Acute sigmoid Diverticulitis--- abdominal pain and white count slightly improving since starting a once on 08/21/2018, white count is down to 9.3, patient is afebrile , please note that patient failed to improve on Cipro and Flagyl combination from 1129 through 08/18/18 , patient was then treated with IV Zosyn from 08/18/2018 through 08/21/2018, previously discussed discussed with on-call for the ID physician Dr. Linus Salmons on 08/21/2018... Continue IV Invanz 1 g every 24 hours....  surgical consult requested due to concerns about worsening sigmoid diverticulitis with  intramural abscess,  possible involvement of the appendix as well as left adnexa involvement... Discussed with general radiologist and interventional radiologist on 08/21/2018 apparently the 2.7 cm sigmoid abscess is intramural and not amenable to IR drainage... Discussed with general surgery on  08/22/2018 at this time they want to hold off on surgical intervention as long as she improves on IV Invanz .... Okay to advance to full liquid diet per surgical team  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  4) periumbilical hernia----not very symptomatic at this time, awaiting surgical consult as outlined above #1  5)Asthma--- stable, no acute exacerbation, continue bronchodilators  6)H/o LUpus--- not symptomatic at this time     Disposition/Need for in-Hospital Stay- patient unable to be discharged at this time due to worsening sigmoid diverticulitis requiring IV Invanz (failed outpatient oral antibiotics and failed IV Zosyn), may need surgical intervention  Code Status : Full  Disposition Plan  : TBD  Consults  :  Phone Consult with ID/Gen Surgery  DVT Prophylaxis  :    - SCDs *  Lab Results  Component Value Date   PLT 515 (H) 08/23/2018   Inpatient Medications  Scheduled Meds: . acidophilus  1 capsule Oral Daily  . fluticasone furoate-vilanterol  1 puff Inhalation Daily  . pantoprazole  40 mg Oral Daily   Continuous Infusions: . dextrose 5 % and 0.45 % NaCl with KCl 20 mEq/L 100 mL/hr at 08/23/18 1412  . ertapenem 1,000 mg (08/23/18 1731)   PRN Meds:.acetaminophen **OR** acetaminophen, HYDROmorphone (DILAUDID) injection, ondansetron **OR** ondansetron (ZOFRAN) IV, oxyCODONE-acetaminophen    Anti-infectives (From admission, onward)  Start     Dose/Rate Route Frequency Ordered Stop   08/21/18 1845  ertapenem (INVANZ) 1,000 mg in sodium chloride 0.9 % 100 mL IVPB     1 g 200 mL/hr over 30 Minutes Intravenous Every 24 hours 08/21/18 1730     08/20/18 1830  piperacillin-tazobactam (ZOSYN) IVPB 3.375 g  Status:  Discontinued     3.375 g 12.5 mL/hr over 240 Minutes Intravenous Every 8 hours 08/20/18 1729 08/21/18  1730   08/18/18 2300  ciprofloxacin (CIPRO) IVPB 400 mg  Status:  Discontinued     400 mg 200 mL/hr over 60 Minutes Intravenous Every 12 hours 08/18/18 1302 08/20/18 1649   08/18/18 1500  metroNIDAZOLE (FLAGYL) IVPB 500 mg  Status:  Discontinued     500 mg 100 mL/hr over 60 Minutes Intravenous Every 8 hours 08/18/18 1302 08/20/18 1649   08/18/18 1100  ciprofloxacin (CIPRO) IVPB 400 mg     400 mg 200 mL/hr over 60 Minutes Intravenous  Once 08/18/18 1047 08/18/18 1239   08/18/18 1100  metroNIDAZOLE (FLAGYL) IVPB 500 mg     500 mg 100 mL/hr over 60 Minutes Intravenous  Once 08/18/18 1047 08/18/18 1344        Objective:   Vitals:   08/22/18 1535 08/22/18 2102 08/23/18 0453 08/23/18 1318  BP: (!) 137/99 133/87 129/86 126/80  Pulse: 88 98 92 87  Resp: 17 16 17 18   Temp: 98.7 F (37.1 C) 99.1 F (37.3 C) 99.2 F (37.3 C) 98.4 F (36.9 C)  TempSrc: Oral Oral Oral Oral  SpO2: 96% 97% 99% 96%  Weight:      Height:        Wt Readings from Last 3 Encounters:  08/18/18 97.1 kg  08/16/18 97.1 kg  06/10/18 96.2 kg     Intake/Output Summary (Last 24 hours) at 08/23/2018 1751 Last data filed at 08/23/2018 1015 Gross per 24 hour  Intake 5673.72 ml  Output -  Net 5673.72 ml   Physical Exam Patient is examined daily including today on 08/23/18 , exams remain the same as of yesterday except that has changed   Gen:- Awake Alert, appears uncomfortable HEENT:- [place].AT, No sclera icterus Neck-Supple Neck,No JVD,.  Lungs-  CTAB , fair symmetrical air movement CV- S1, S2 normal, regular  Abd-  +ve B.Sounds, Abd Soft, abdominal tenderness is improving,  extremity/Skin:- No  edema, pedal pulses present  Psych-affect is appropriate, oriented x3 Neuro-no new focal deficits, no tremors   Data Review:   Micro Results Recent Results (from the past 240 hour(s))  Wet prep, genital     Status: Abnormal   Collection Time: 08/18/18 10:30 AM  Result Value Ref Range Status   Yeast Wet Prep HPF  POC NONE SEEN NONE SEEN Final   Trich, Wet Prep NONE SEEN NONE SEEN Final   Clue Cells Wet Prep HPF POC NONE SEEN NONE SEEN Final   WBC, Wet Prep HPF POC FEW (A) NONE SEEN Final   Sperm NONE SEEN  Final    Comment: Performed at [facility], [address], [place], [place] [address]    Radiology Reports Ct Abdomen Pelvis W Contrast  Result Date: 08/21/2018 CLINICAL DATA:  Abdominal pain suspected diverticulitis. EXAM: CT ABDOMEN AND PELVIS WITH CONTRAST TECHNIQUE: Multidetector CT imaging of the abdomen and pelvis was performed using the standard protocol following bolus administration of intravenous contrast. CONTRAST:  166mL OMNIPAQUE IOHEXOL 300 MG/ML  SOLN COMPARISON:  Body CT 08/16/2018 and 05/06/2018 FINDINGS: Lower chest:  Hyperventilatory changes in the right lung base. Hepatobiliary: No focal liver abnormality is seen. No gallstones, gallbladder wall thickening, or biliary dilatation. Pancreas: Unremarkable. No pancreatic ductal dilatation or surrounding inflammatory changes. Spleen: Normal in size without focal abnormality. Adrenals/Urinary Tract: Adrenal glands are unremarkable. Kidneys are normal, without renal calculi, focal lesion, or hydronephrosis. Bladder is unremarkable. Stomach/Bowel: Normal appearance of the stomach and small bowel. Scattered colonic diverticulosis. The area of presumed diverticulitis in the sigmoid colon demonstrates interval worsening inflammatory changes. There is a relatively long segment of circumferential marked mucosal thickening of the involved sigmoid colon with an intramural area of hypoattenuation which measures 2.7 x 2.1 x 1.9 cm, likely representing an abscess. The appendix is long and crosses the midline with extension to the left lower quadrant. The distal part of the appendix, which abuts the abnormal sigmoid colon, is thickened to 11 mm, fluid-filled and with indistinct borders, consistent with tip appendicitis, likely secondary to the  inflammatory process of the sigmoid colon. There is marked pericolonic inflammatory stranding. Interval development of adjacent mesenteric lymphadenopathy. Small amount of free fluid tracks into the left adnexa. Vascular/Lymphatic: Likely reactive central mesenteric lymphadenopathy. Reproductive: Normal appearance of the uterus and the right adnexa. Inflammatory fluid within the left adnexa. Other: Known periumbilical fat and fluid containing anterior abdominal wall hernia. Musculoskeletal: Post left hip arthroplasty, stable. IMPRESSION: Worsening of the presumed sigmoid colon diverticulitis with marked thickening of the involved sigmoid colon, pericolonic inflammatory changes, 2.7 cm intramural abscess, and secondary tip appendicitis. Small amount of inflammatory fluid tracks into the left adnexa. Likely reactive central mesenteric lymphadenopathy. These results were called by telephone at the time of interpretation on 08/21/2018 at 4:54 pm to Dr. Roxan Hockey , who verbally acknowledged these results. Electronically Signed   By: Fidela Salisbury M.D.   On: 08/21/2018 17:13   Ct Abdomen Pelvis W Contrast  Result Date: 08/16/2018 CLINICAL DATA:  Lower abdominal pain and no midline hernia. Some low back pain as hernia repair 2014 but states hernia has recurred. Pain worse over the past 3 days. EXAM: CT ABDOMEN AND PELVIS WITH CONTRAST TECHNIQUE: Multidetector CT imaging of the abdomen and pelvis was performed using the standard protocol following bolus administration of intravenous contrast. CONTRAST:  164mL OMNIPAQUE IOHEXOL 300 MG/ML  SOLN COMPARISON:  05/06/2018 and 02/01/2018 FINDINGS: Lower chest: Calcified granuloma over the left lower lobe. Mild cardiomegaly. Hepatobiliary: Gallbladder, liver and biliary tree are normal. Pancreas: Normal. Spleen: Normal. Adrenals/Urinary Tract: Adrenal glands are normal. Kidneys are normal in size without hydronephrosis or nephrolithiasis. Ureters and bladder are  normal. Stomach/Bowel: Stomach and small bowel are normal. Appendix is within normal. Diverticulosis of the colon. There is inflammatory change adjacent a short segment of the thick-walled sigmoid colon in the left lower quadrant compatible with acute diverticulitis. There is no perforation or abscess. Vascular/Lymphatic: Normal. Reproductive: Within normal. Other: Periumbilical hernia containing peritoneal fat unchanged. Tiny amount of free pelvic fluid. Musculoskeletal: Left hip arthroplasty intact. Mild degenerate change of the right hip. IMPRESSION: Colonic diverticulosis with mild acute diverticulitis involving a short segment of the sigmoid colon in the left lower quadrant. No perforation or abscess. Known umbilical hernia containing peritoneal fat unchanged. Electronically Signed   By: Marin Olp M.D.   On: 08/16/2018 20:11   Dg Abd Acute W/chest  Result Date: 08/18/2018 CLINICAL DATA:  Abdominal pain and vomiting EXAM: DG ABDOMEN ACUTE W/ 1V CHEST COMPARISON:  12/25/2017 chest radiograph. 05/06/2017 abdominal radiograph FINDINGS: Stable cardiomediastinal silhouette with cardiomegaly. No pneumothorax. No  pleural effusion. Mild bibasilar atelectasis. No pulmonary edema. No consolidative airspace disease. No dilated small bowel loops or significant air-fluid levels. Mild colonic stool. No evidence of pneumatosis or pneumoperitoneum. No radiopaque nephrolithiasis. Left total hip arthroplasty. Lower lumbar spondylosis. IMPRESSION: 1. Stable mild cardiomegaly without pulmonary edema. 2. Mild bibasilar atelectasis. 3. Nonobstructive bowel gas pattern. Electronically Signed   By: Ilona Sorrel M.D.   On: 08/18/2018 11:54     CBC Recent Labs  Lab 08/16/18 1830 08/18/18 1017 08/19/18 0152 08/20/18 0222 08/21/18 0335 08/22/18 1020 08/23/18 0304  WBC 10.5 11.9* 12.0* 11.4* 13.4* 11.2* 9.3  HGB 11.9* 12.0 11.0* 11.0* 11.0* 11.3* 11.1*  HCT 39.7 39.5 34.6* 36.7 34.4* 35.9* 35.7*  PLT 377 462* 383  429* 419* 470* 515*  MCV 83.6 82.3 81.0 82.1 80.0 80.7 80.2  MCH 25.1* 25.0* 25.8* 24.6* 25.6* 25.4* 24.9*  MCHC 30.0 30.4 31.8 30.0 32.0 31.5 31.1  RDW 15.0 14.6 14.6 14.4 14.2 14.2 14.2  LYMPHSABS 2.1 2.9  --   --   --   --   --   MONOABS 0.8 1.1*  --   --   --   --   --   EOSABS 0.4 0.3  --   --   --   --   --   BASOSABS 0.0 0.0  --   --   --   --   --     Chemistries  Recent Labs  Lab 08/16/18 1830 08/18/18 1017 08/19/18 0152 08/21/18 0335 08/22/18 1020 08/23/18 0304  NA 138 137 136 139 138 138  K 3.8 3.9 3.9 3.1* 3.6 3.5  CL 110 103 102 103 104 104  CO2 22 25 26 24 24 23   GLUCOSE 118* 97 103* 118* 122* 117*  BUN 10 6 <5* <5* <5* <5*  CREATININE 0.88 0.69 0.70 0.69 0.69 0.78  CALCIUM 9.1 9.0 8.6* 8.6* 8.9 8.7*  AST 15  --   --   --   --   --   ALT 13  --   --   --   --   --   ALKPHOS 68  --   --   --   --   --   BILITOT 0.3  --   --   --   --   --    -------------------------------------------  Roxan Hockey M.D on 08/23/2018 at 5:51 PM  Pager---[phone] Go to [email or web address] - password [email or web address] for contact info  [facility] - Office  [phone]

## 2018-08-23 NOTE — Progress Notes (Signed)
  Progress Note: General Surgery Service   Assessment/Plan: Principal Problem:   Acute diverticulitis Active Problems:   GERD (gastroesophageal reflux disease)   Asthma   Vaginal spotting  Diverticulitis, WBC and pain improving -continue broad abx today -clear liquids and protein shakes today -ambulate   LOS: 5 days  Chief Complaint/Subjective: Pain much improved, no BM, no nausea  Objective: Vital signs in last 24 hours: Temp:  [98.7 F (37.1 C)-99.2 F (37.3 C)] 99.2 F (37.3 C) (12/06 0453) Pulse Rate:  [88-98] 92 (12/06 0453) Resp:  [16-17] 17 (12/06 0453) BP: (129-137)/(86-99) 129/86 (12/06 0453) SpO2:  [96 %-99 %] 99 % (12/06 0453) Last BM Date: 08/22/18  Intake/Output from previous day: 12/05 0701 - 12/06 0700 In: 5493.7 [P.O.:450; I.V.:3578.2; IV Piggyback:1465.6] Out: -  Intake/Output this shift: Total I/O In: 300 [P.O.:300] Out: -   Lungs: nonlabored breathing  Cardiovascular: RRR  Abd: soft, pain to palpation left lower quadrant, no guarding or rebound  Extremities: no edema  Neuro: AOx4  Lab Results: CBC  Recent Labs    08/22/18 1020 08/23/18 0304  WBC 11.2* 9.3  HGB 11.3* 11.1*  HCT 35.9* 35.7*  PLT 470* 515*   BMET Recent Labs    08/22/18 1020 08/23/18 0304  NA 138 138  K 3.6 3.5  CL 104 104  CO2 24 23  GLUCOSE 122* 117*  BUN <5* <5*  CREATININE 0.69 0.78  CALCIUM 8.9 8.7*   PT/INR No results for input(s): LABPROT, INR in the last 72 hours. ABG No results for input(s): PHART, HCO3 in the last 72 hours.  Invalid input(s): PCO2, PO2  Studies/Results:  Anti-infectives: Anti-infectives (From admission, onward)   Start     Dose/Rate Route Frequency Ordered Stop   08/21/18 1845  ertapenem (INVANZ) 1,000 mg in sodium chloride 0.9 % 100 mL IVPB     1 g 200 mL/hr over 30 Minutes Intravenous Every 24 hours 08/21/18 1730     08/20/18 1830  piperacillin-tazobactam (ZOSYN) IVPB 3.375 g  Status:  Discontinued     3.375  g 12.5 mL/hr over 240 Minutes Intravenous Every 8 hours 08/20/18 1729 08/21/18 1730   08/18/18 2300  ciprofloxacin (CIPRO) IVPB 400 mg  Status:  Discontinued     400 mg 200 mL/hr over 60 Minutes Intravenous Every 12 hours 08/18/18 1302 08/20/18 1649   08/18/18 1500  metroNIDAZOLE (FLAGYL) IVPB 500 mg  Status:  Discontinued     500 mg 100 mL/hr over 60 Minutes Intravenous Every 8 hours 08/18/18 1302 08/20/18 1649   08/18/18 1100  ciprofloxacin (CIPRO) IVPB 400 mg     400 mg 200 mL/hr over 60 Minutes Intravenous  Once 08/18/18 1047 08/18/18 1239   08/18/18 1100  metroNIDAZOLE (FLAGYL) IVPB 500 mg     500 mg 100 mL/hr over 60 Minutes Intravenous  Once 08/18/18 1047 08/18/18 1344      Medications: Scheduled Meds: . fluticasone furoate-vilanterol  1 puff Inhalation Daily  . lactobacillus  1 g Oral TID WC  . pantoprazole  40 mg Oral Daily   Continuous Infusions: . dextrose 5 % and 0.45 % NaCl with KCl 20 mEq/L 100 mL/hr at 08/23/18 0345  . ertapenem 1,000 mg (08/22/18 1817)   PRN Meds:.acetaminophen **OR** acetaminophen, HYDROmorphone (DILAUDID) injection, ondansetron **OR** ondansetron (ZOFRAN) IV, oxyCODONE-acetaminophen  Mickeal Skinner, MD Office number [phone] [facility]

## 2018-08-24 NOTE — Progress Notes (Signed)
Patient Demographics:    Dana Kennedy, is a 52 y.o. female, DOB - [birth year], HFW:[id number]  Admit date - 08/18/2018   Admitting Physician No admitting provider for patient encounter.  Outpatient Primary MD for the patient is [facility], [place] - 6   Chief Complaint  Patient presents with  . Emesis  . Vaginal Bleeding        Subjective:    Dana Kennedy today has no fevers, patient had abdominal pain and nausea after drinking Ensure today, no significant nausea today, abdominal pain appears to be improving  Assessment  & Plan :    Principal Problem:   Acute diverticulitis Active Problems:   GERD (gastroesophageal reflux disease)   Asthma   Vaginal spotting  Brief Summary:- 52 year old past medical history relevant for ITP, lupus, GERD and asthma along with a known umbilical hernia which she is awaiting surgical repair with [facility] [place] [facility] admitted on 08/18/2018 with worsening sigmoid diverticulitis after failing oral Flagyl/Cipro combination from 08/16/2018 to 08/18/2018  Plan: 1)Acute sigmoid Diverticulitis--- leukocytosis has resolved, no fevers, abdominal pain improving, did not tolerate full liquid diet well, general surgery wants to try clear liquid diet again,  please note that patient failed to improve on Cipro and Flagyl combination from 1129 through 08/18/18 , patient was then treated with IV Zosyn from 08/18/2018 through 08/21/2018, previously discussed discussed with on-call for the ID physician Dr. Linus Salmons on 08/21/2018... Continue IV Invanz 1 g every 24 hours....  surgical consult requested due to concerns about worsening sigmoid diverticulitis with  intramural abscess,  possible involvement of the appendix as well as left adnexa involvement... Discussed with general radiologist and interventional radiologist on 08/21/2018 apparently the 2.7 cm sigmoid abscess is  intramural and not amenable to IR drainage... Discussed with general surgery o they want to hold off on surgical intervention as long as she improves on IV Invanz ...Marland Kitchen     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  4) periumbilical hernia----not very symptomatic at this time, awaiting surgical consult as outlined above #1  5)Asthma--- stable, no acute exacerbation, continue bronchodilators  6)H/o LUpus--- not symptomatic at this time     Disposition/Need for in-Hospital Stay- patient unable to be discharged at this time due to worsening sigmoid diverticulitis requiring IV Invanz (failed outpatient oral antibiotics and failed IV Zosyn), may need surgical intervention, did not tolerate full liquid diet  Code Status : Full  Disposition Plan  : TBD  Consults  :  Phone Consult with ID/Gen Surgery  DVT Prophylaxis  :    - SCDs *  Lab Results  Component Value Date   PLT 515 (H) 08/23/2018   Inpatient Medications  Scheduled Meds: . acidophilus  1 capsule Oral Daily  . fluticasone furoate-vilanterol  1 puff Inhalation Daily  . pantoprazole  40 mg Oral Daily   Continuous Infusions: . dextrose 5 % and 0.45 % NaCl with KCl 20 mEq/L 100 mL/hr at 08/24/18 0228  . ertapenem 1,000 mg (08/23/18 1731)   PRN Meds:.acetaminophen **OR** acetaminophen, HYDROmorphone (DILAUDID) injection, ondansetron **OR** ondansetron (ZOFRAN) IV, oxyCODONE-acetaminophen    Anti-infectives (From admission,  onward)   Start     Dose/Rate Route Frequency Ordered Stop   08/21/18 1845  ertapenem (INVANZ) 1,000 mg in sodium chloride 0.9 % 100 mL IVPB     1 g 200 mL/hr over 30 Minutes Intravenous Every 24 hours 08/21/18 1730     08/20/18 1830  piperacillin-tazobactam (ZOSYN) IVPB 3.375 g  Status:  Discontinued     3.375 g 12.5 mL/hr over 240 Minutes  Intravenous Every 8 hours 08/20/18 1729 08/21/18 1730   08/18/18 2300  ciprofloxacin (CIPRO) IVPB 400 mg  Status:  Discontinued     400 mg 200 mL/hr over 60 Minutes Intravenous Every 12 hours 08/18/18 1302 08/20/18 1649   08/18/18 1500  metroNIDAZOLE (FLAGYL) IVPB 500 mg  Status:  Discontinued     500 mg 100 mL/hr over 60 Minutes Intravenous Every 8 hours 08/18/18 1302 08/20/18 1649   08/18/18 1100  ciprofloxacin (CIPRO) IVPB 400 mg     400 mg 200 mL/hr over 60 Minutes Intravenous  Once 08/18/18 1047 08/18/18 1239   08/18/18 1100  metroNIDAZOLE (FLAGYL) IVPB 500 mg     500 mg 100 mL/hr over 60 Minutes Intravenous  Once 08/18/18 1047 08/18/18 1344        Objective:   Vitals:   08/23/18 1318 08/23/18 2154 08/24/18 0456 08/24/18 1400  BP: 126/80 (!) 145/100 (!) 105/92 135/80  Pulse: 87 91 90 87  Resp: 18 16 17 16   Temp: 98.4 F (36.9 C) 97.7 F (36.5 C) 98.5 F (36.9 C) 98.6 F (37 C)  TempSrc: Oral Oral Oral Oral  SpO2: 96% 99% 97% 96%  Weight:      Height:        Wt Readings from Last 3 Encounters:  08/18/18 97.1 kg  08/16/18 97.1 kg  06/10/18 96.2 kg     Intake/Output Summary (Last 24 hours) at 08/24/2018 1619 Last data filed at 08/23/2018 2347 Gross per 24 hour  Intake 230 ml  Output -  Net 230 ml   Physical Exam Patient is examined daily including today on 08/24/18 , exams remain the same as of yesterday except that has changed   Gen:- Awake Alert, in no apparent distress  HEENT:- [place].AT, No sclera icterus Neck-Supple Neck,No JVD,.  Lungs-  CTAB , fair symmetrical air movement CV- S1, S2 normal, regular  Abd-  +ve B.Sounds, Abd Soft,  abdominal tenderness continues to improve especially in left lower quadrant extremity/Skin:- No  edema, pedal pulses present  Psych-affect is appropriate, oriented x3 Neuro-no new focal deficits, no tremors   Data Review:   Micro Results Recent Results (from the past 240 hour(s))  Wet prep, genital     Status: Abnormal    Collection Time: 08/18/18 10:30 AM  Result Value Ref Range Status   Yeast Wet Prep HPF POC NONE SEEN NONE SEEN Final   Trich, Wet Prep NONE SEEN NONE SEEN Final   Clue Cells Wet Prep HPF POC NONE SEEN NONE SEEN Final   WBC, Wet Prep HPF POC FEW (A) NONE SEEN Final   Sperm NONE SEEN  Final    Comment: Performed at [facility], [address], [place], [place] [address]    Radiology Reports Ct Abdomen Pelvis W Contrast  Result Date: 08/21/2018 CLINICAL DATA:  Abdominal pain suspected diverticulitis. EXAM: CT ABDOMEN AND PELVIS WITH CONTRAST TECHNIQUE: Multidetector CT imaging of the abdomen and pelvis was performed using the standard protocol following bolus administration of intravenous contrast. CONTRAST:  148mL OMNIPAQUE IOHEXOL 300  MG/ML  SOLN COMPARISON:  Body CT 08/16/2018 and 05/06/2018 FINDINGS: Lower chest: Hyperventilatory changes in the right lung base. Hepatobiliary: No focal liver abnormality is seen. No gallstones, gallbladder wall thickening, or biliary dilatation. Pancreas: Unremarkable. No pancreatic ductal dilatation or surrounding inflammatory changes. Spleen: Normal in size without focal abnormality. Adrenals/Urinary Tract: Adrenal glands are unremarkable. Kidneys are normal, without renal calculi, focal lesion, or hydronephrosis. Bladder is unremarkable. Stomach/Bowel: Normal appearance of the stomach and small bowel. Scattered colonic diverticulosis. The area of presumed diverticulitis in the sigmoid colon demonstrates interval worsening inflammatory changes. There is a relatively long segment of circumferential marked mucosal thickening of the involved sigmoid colon with an intramural area of hypoattenuation which measures 2.7 x 2.1 x 1.9 cm, likely representing an abscess. The appendix is long and crosses the midline with extension to the left lower quadrant. The distal part of the appendix, which abuts the abnormal sigmoid colon, is thickened to 11 mm, fluid-filled and with  indistinct borders, consistent with tip appendicitis, likely secondary to the inflammatory process of the sigmoid colon. There is marked pericolonic inflammatory stranding. Interval development of adjacent mesenteric lymphadenopathy. Small amount of free fluid tracks into the left adnexa. Vascular/Lymphatic: Likely reactive central mesenteric lymphadenopathy. Reproductive: Normal appearance of the uterus and the right adnexa. Inflammatory fluid within the left adnexa. Other: Known periumbilical fat and fluid containing anterior abdominal wall hernia. Musculoskeletal: Post left hip arthroplasty, stable. IMPRESSION: Worsening of the presumed sigmoid colon diverticulitis with marked thickening of the involved sigmoid colon, pericolonic inflammatory changes, 2.7 cm intramural abscess, and secondary tip appendicitis. Small amount of inflammatory fluid tracks into the left adnexa. Likely reactive central mesenteric lymphadenopathy. These results were called by telephone at the time of interpretation on 08/21/2018 at 4:54 pm to Dr. Roxan Hockey , who verbally acknowledged these results. Electronically Signed   By: Fidela Salisbury M.D.   On: 08/21/2018 17:13   Ct Abdomen Pelvis W Contrast  Result Date: 08/16/2018 CLINICAL DATA:  Lower abdominal pain and no midline hernia. Some low back pain as hernia repair 2014 but states hernia has recurred. Pain worse over the past 3 days. EXAM: CT ABDOMEN AND PELVIS WITH CONTRAST TECHNIQUE: Multidetector CT imaging of the abdomen and pelvis was performed using the standard protocol following bolus administration of intravenous contrast. CONTRAST:  154mL OMNIPAQUE IOHEXOL 300 MG/ML  SOLN COMPARISON:  05/06/2018 and 02/01/2018 FINDINGS: Lower chest: Calcified granuloma over the left lower lobe. Mild cardiomegaly. Hepatobiliary: Gallbladder, liver and biliary tree are normal. Pancreas: Normal. Spleen: Normal. Adrenals/Urinary Tract: Adrenal glands are normal. Kidneys are normal  in size without hydronephrosis or nephrolithiasis. Ureters and bladder are normal. Stomach/Bowel: Stomach and small bowel are normal. Appendix is within normal. Diverticulosis of the colon. There is inflammatory change adjacent a short segment of the thick-walled sigmoid colon in the left lower quadrant compatible with acute diverticulitis. There is no perforation or abscess. Vascular/Lymphatic: Normal. Reproductive: Within normal. Other: Periumbilical hernia containing peritoneal fat unchanged. Tiny amount of free pelvic fluid. Musculoskeletal: Left hip arthroplasty intact. Mild degenerate change of the right hip. IMPRESSION: Colonic diverticulosis with mild acute diverticulitis involving a short segment of the sigmoid colon in the left lower quadrant. No perforation or abscess. Known umbilical hernia containing peritoneal fat unchanged. Electronically Signed   By: Marin Olp M.D.   On: 08/16/2018 20:11   Dg Abd Acute W/chest  Result Date: 08/18/2018 CLINICAL DATA:  Abdominal pain and vomiting EXAM: DG ABDOMEN ACUTE W/ 1V CHEST COMPARISON:  12/25/2017 chest  radiograph. 05/06/2017 abdominal radiograph FINDINGS: Stable cardiomediastinal silhouette with cardiomegaly. No pneumothorax. No pleural effusion. Mild bibasilar atelectasis. No pulmonary edema. No consolidative airspace disease. No dilated small bowel loops or significant air-fluid levels. Mild colonic stool. No evidence of pneumatosis or pneumoperitoneum. No radiopaque nephrolithiasis. Left total hip arthroplasty. Lower lumbar spondylosis. IMPRESSION: 1. Stable mild cardiomegaly without pulmonary edema. 2. Mild bibasilar atelectasis. 3. Nonobstructive bowel gas pattern. Electronically Signed   By: Ilona Sorrel M.D.   On: 08/18/2018 11:54     CBC Recent Labs  Lab 08/18/18 1017 08/19/18 0152 08/20/18 0222 08/21/18 0335 08/22/18 1020 08/23/18 0304  WBC 11.9* 12.0* 11.4* 13.4* 11.2* 9.3  HGB 12.0 11.0* 11.0* 11.0* 11.3* 11.1*  HCT 39.5 34.6*  36.7 34.4* 35.9* 35.7*  PLT 462* 383 429* 419* 470* 515*  MCV 82.3 81.0 82.1 80.0 80.7 80.2  MCH 25.0* 25.8* 24.6* 25.6* 25.4* 24.9*  MCHC 30.4 31.8 30.0 32.0 31.5 31.1  RDW 14.6 14.6 14.4 14.2 14.2 14.2  LYMPHSABS 2.9  --   --   --   --   --   MONOABS 1.1*  --   --   --   --   --   EOSABS 0.3  --   --   --   --   --   BASOSABS 0.0  --   --   --   --   --     Chemistries  Recent Labs  Lab 08/18/18 1017 08/19/18 0152 08/21/18 0335 08/22/18 1020 08/23/18 0304  NA 137 136 139 138 138  K 3.9 3.9 3.1* 3.6 3.5  CL 103 102 103 104 104  CO2 25 26 24 24 23   GLUCOSE 97 103* 118* 122* 117*  BUN 6 <5* <5* <5* <5*  CREATININE 0.69 0.70 0.69 0.69 0.78  CALCIUM 9.0 8.6* 8.6* 8.9 8.7*   -------------------------------------------  Roxan Hockey M.D on 08/24/2018 at 4:19 PM  Pager---[phone] Go to [email or web address] - password [email or web address] for contact info  [facility] - Office  [phone]

## 2018-08-24 NOTE — Progress Notes (Signed)
[facility] Progress Note     Subjective: CC: LLQ pain and diarrhea Improving overall now, but patient had worsened pain and diarrhea with ensure yesterday. Is tolerating most CLD without pain but notes certain items bother her.   Objective: Vital signs in last 24 hours: Temp:  [97.7 F (36.5 C)-98.5 F (36.9 C)] 98.5 F (36.9 C) (12/07 0456) Pulse Rate:  [87-91] 90 (12/07 0456) Resp:  [16-18] 17 (12/07 0456) BP: (105-145)/(80-100) 105/92 (12/07 0456) SpO2:  [96 %-99 %] 97 % (12/07 0456) Last BM Date: 08/22/18  Intake/Output from previous day: 12/06 0701 - 12/07 0700 In: 860 [P.O.:860] Out: -  Intake/Output this shift: No intake/output data recorded.  PE: Gen:  Alert, NAD, pleasant Card:  Regular rate and rhythm, pedal pulses 2+ BL Pulm:  Normal effort, clear to auscultation bilaterally Abd: Soft, TTP in LLQ, non-distended, bowel sounds present, no HSM Skin: warm and dry, no rashes  Psych: A&Ox3   Lab Results:  Recent Labs    08/22/18 1020 08/23/18 0304  WBC 11.2* 9.3  HGB 11.3* 11.1*  HCT 35.9* 35.7*  PLT 470* 515*   BMET Recent Labs    08/22/18 1020 08/23/18 0304  NA 138 138  K 3.6 3.5  CL 104 104  CO2 24 23  GLUCOSE 122* 117*  BUN <5* <5*  CREATININE 0.69 0.78  CALCIUM 8.9 8.7*   PT/INR No results for input(s): LABPROT, INR in the last 72 hours. CMP     Component Value Date/Time   NA 138 08/23/2018 0304   NA 142 05/08/2013 0850   K 3.5 08/23/2018 0304   K 4.3 05/08/2013 0850   CL 104 08/23/2018 0304   CL 108 (H) 10/16/2012 1325   CO2 23 08/23/2018 0304   CO2 21 (L) 05/08/2013 0850   GLUCOSE 117 (H) 08/23/2018 0304   GLUCOSE 96 05/08/2013 0850   GLUCOSE 109 (H) 10/16/2012 1325   BUN <5 (L) 08/23/2018 0304   BUN 15.0 05/08/2013 0850   CREATININE 0.78 08/23/2018 0304   CREATININE 0.81 02/01/2018 0838   CREATININE 0.8 05/08/2013 0850   CALCIUM 8.7 (L) 08/23/2018 0304   CALCIUM 9.0 05/08/2013 0850   PROT 6.3 (L) 08/16/2018  1830   PROT 7.0 05/08/2013 0850   ALBUMIN 3.4 (L) 08/16/2018 1830   ALBUMIN 3.5 05/08/2013 0850   AST 15 08/16/2018 1830   AST 10 02/01/2018 0838   AST 12 05/08/2013 0850   ALT 13 08/16/2018 1830   ALT 10 02/01/2018 0838   ALT 11 05/08/2013 0850   ALKPHOS 68 08/16/2018 1830   ALKPHOS 71 05/08/2013 0850   BILITOT 0.3 08/16/2018 1830   BILITOT <0.2 (L) 02/01/2018 0838   BILITOT 0.28 05/08/2013 0850   GFRNONAA >60 08/23/2018 0304   GFRNONAA >60 02/01/2018 0838   GFRAA >60 08/23/2018 0304   GFRAA >60 02/01/2018 0838   Lipase     Component Value Date/Time   LIPASE 39 05/06/2018 1216       Studies/Results: No results found.  Anti-infectives: Anti-infectives (From admission, onward)   Start     Dose/Rate Route Frequency Ordered Stop   08/21/18 1845  ertapenem (INVANZ) 1,000 mg in sodium chloride 0.9 % 100 mL IVPB     1 g 200 mL/hr over 30 Minutes Intravenous Every 24 hours 08/21/18 1730     08/20/18 1830  piperacillin-tazobactam (ZOSYN) IVPB 3.375 g  Status:  Discontinued     3.375 g 12.5 mL/hr over 240 Minutes Intravenous Every 8 hours  08/20/18 1729 08/21/18 1730   08/18/18 2300  ciprofloxacin (CIPRO) IVPB 400 mg  Status:  Discontinued     400 mg 200 mL/hr over 60 Minutes Intravenous Every 12 hours 08/18/18 1302 08/20/18 1649   08/18/18 1500  metroNIDAZOLE (FLAGYL) IVPB 500 mg  Status:  Discontinued     500 mg 100 mL/hr over 60 Minutes Intravenous Every 8 hours 08/18/18 1302 08/20/18 1649   08/18/18 1100  ciprofloxacin (CIPRO) IVPB 400 mg     400 mg 200 mL/hr over 60 Minutes Intravenous  Once 08/18/18 1047 08/18/18 1239   08/18/18 1100  metroNIDAZOLE (FLAGYL) IVPB 500 mg     500 mg 100 mL/hr over 60 Minutes Intravenous  Once 08/18/18 1047 08/18/18 1344       Assessment/Plan GERD Asthma  Diverticulitis - WBC trending down, afeb - continue abx - some pain and diarrhea with FLD - keep on CLD - ambulate  LOS: 6 days    Brigid Re , [facility]  Surgery 08/24/2018, 10:20 AM Pager: [phone] Consults: [phone] Mon-Fri 7:00 am-4:30 pm Sat-Sun 7:00 am-11:30 am

## 2018-08-25 LAB — BASIC METABOLIC PANEL
Anion gap: 11 (ref 5–15)
BUN: 6 mg/dL (ref 6–20)
CO2: 24 mmol/L (ref 22–32)
Calcium: 8.5 mg/dL — ABNORMAL LOW (ref 8.9–10.3)
Chloride: 103 mmol/L (ref 98–111)
Creatinine, Ser: 0.73 mg/dL (ref 0.44–1.00)
GFR calc Af Amer: >60 mL/min (ref >60)
GFR calc non Af Amer: >60 mL/min (ref >60)
Glucose, Bld: 100 mg/dL — ABNORMAL HIGH (ref 70–99)
Potassium: 3.6 mmol/L (ref 3.5–5.1)
Sodium: 138 mmol/L (ref 135–145)

## 2018-08-25 LAB — CBC
HCT: 35.5 % — ABNORMAL LOW (ref 36.0–46.0)
HEMOGLOBIN: 11 g/dL — AB (ref 12.0–15.0)
MCH: 24.9 pg — AB (ref 26.0–34.0)
MCHC: 31 g/dL (ref 30.0–36.0)
MCV: 80.5 fL (ref 80.0–100.0)
Platelets: 593 10*3/uL — ABNORMAL HIGH (ref 150–400)
RBC: 4.41 MIL/uL (ref 3.87–5.11)
RDW: 14.4 % (ref 11.5–15.5)
WBC: 7.3 10*3/uL (ref 4.0–10.5)
nRBC: 0 % (ref 0.0–0.2)

## 2018-08-25 MED ORDER — FLORANEX PO PACK
1.0000 g | PACK | Freq: Three times a day (TID) | ORAL | Status: DC
  Administered 2018-08-25 – 2018-08-26 (×3): 1 g via ORAL
  Filled 2018-08-25 (×5): qty 1

## 2018-08-25 NOTE — Progress Notes (Signed)
Patient Demographics:    Dana Kennedy, is a 52 y.o. female, DOB - May 17, 1966, ZHG:[id number]  Admit date - 08/18/2018   Admitting Physician No admitting provider for patient encounter.  Outpatient Primary MD for the patient is Premier, [place] - 7   Chief Complaint  Patient presents with  . Emesis  . Vaginal Bleeding        Subjective:    Dana Kennedy today has no fevers, patient had abdominal pain and nausea after drinking Ensure today, no significant nausea today, abdominal pain appears to be improving  Assessment  & Plan :    Principal Problem:   Acute diverticulitis Active Problems:   GERD (gastroesophageal reflux disease)   Asthma   Vaginal spotting  Brief Summary:- 53 year old past medical history relevant for ITP, lupus, GERD and asthma along with a known umbilical hernia which she is awaiting surgical repair with central Backus surgery admitted on 08/18/2018 with worsening sigmoid diverticulitis after failing oral Flagyl/Cipro combination from 08/16/2018 to 08/18/2018  Plan: 1)Acute sigmoid Diverticulitis--- clinically continues to improve, tolerating full liquid diet well, afebrile, no further leukocytosis, and abdominal pain is improving as well,   please note that patient failed to improve on Cipro and Flagyl combination from 1129 through 08/18/18 , patient was then treated with IV Zosyn from 08/18/2018 through 08/21/2018, previously discussed discussed with on-call for the ID physician Dr. Linus Salmons on 08/21/2018... Continue IV Invanz 1 g every 24 hours....  surgical consult appreciated, repeat CT abdomen not shown  worsening sigmoid diverticulitis with  intramural abscess,  possible involvement of the appendix as well as left adnexa involvement... Discussed with general radiologist and interventional radiologist on 08/21/2018 apparently the 2.7 cm sigmoid abscess is  intramural and not amenable to IR drainage... Discussed with general surgery o they want to hold off on surgical intervention as long as she improves on IV Invanz .... Possible discharge home on Cipro and Flagyl in a.m.   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  4) periumbilical hernia----not very symptomatic at this time,    5)Asthma--- stable, no acute exacerbation, continue bronchodilators  6)H/o LUpus--- not symptomatic at this time     Disposition/Need for in-Hospital Stay- patient unable to be discharged at this time due to worsening sigmoid diverticulitis requiring IV Invanz (failed outpatient oral antibiotics and failed IV Zosyn), may need surgical intervention, did not tolerate full liquid diet  Code Status : Full  Disposition Plan  : TBD  Consults  :  Phone Consult with ID/Gen Surgery  DVT Prophylaxis  :    - SCDs *  Lab Results  Component Value Date   PLT 593 (H) 08/25/2018   Inpatient Medications  Scheduled Meds: . fluticasone furoate-vilanterol  1 puff Inhalation Daily  . lactobacillus  1 g Oral TID WC  . pantoprazole  40 mg Oral Daily   Continuous Infusions: . dextrose 5 % and 0.45 % NaCl with KCl 20 mEq/L 100 mL/hr at 08/25/18 0400  . ertapenem Stopped (08/24/18 1831)   PRN Meds:.acetaminophen **OR** acetaminophen, HYDROmorphone (DILAUDID) injection, ondansetron **OR** ondansetron (ZOFRAN) IV, oxyCODONE-acetaminophen    Anti-infectives (From  admission, onward)   Start     Dose/Rate Route Frequency Ordered Stop   08/21/18 1845  ertapenem (INVANZ) 1,000 mg in sodium chloride 0.9 % 100 mL IVPB     1 g 200 mL/hr over 30 Minutes Intravenous Every 24 hours 08/21/18 1730     08/20/18 1830  piperacillin-tazobactam (ZOSYN) IVPB 3.375 g  Status:  Discontinued     3.375 g 12.5 mL/hr over 240 Minutes  Intravenous Every 8 hours 08/20/18 1729 08/21/18 1730   08/18/18 2300  ciprofloxacin (CIPRO) IVPB 400 mg  Status:  Discontinued     400 mg 200 mL/hr over 60 Minutes Intravenous Every 12 hours 08/18/18 1302 08/20/18 1649   08/18/18 1500  metroNIDAZOLE (FLAGYL) IVPB 500 mg  Status:  Discontinued     500 mg 100 mL/hr over 60 Minutes Intravenous Every 8 hours 08/18/18 1302 08/20/18 1649   08/18/18 1100  ciprofloxacin (CIPRO) IVPB 400 mg     400 mg 200 mL/hr over 60 Minutes Intravenous  Once 08/18/18 1047 08/18/18 1239   08/18/18 1100  metroNIDAZOLE (FLAGYL) IVPB 500 mg     500 mg 100 mL/hr over 60 Minutes Intravenous  Once 08/18/18 1047 08/18/18 1344        Objective:   Vitals:   08/24/18 0456 08/24/18 1400 08/24/18 2155 08/25/18 0610  BP: (!) 105/92 135/80 124/80 (!) 147/92  Pulse: 90 87 87 68  Resp: 17 16 18 19   Temp: 98.5 F (36.9 C) 98.6 F (37 C) (!) 97.3 F (36.3 C) 97.9 F (36.6 C)  TempSrc: Oral Oral Oral Oral  SpO2: 97% 96% 96% 94%  Weight:      Height:        Wt Readings from Last 3 Encounters:  08/18/18 97.1 kg  08/16/18 97.1 kg  06/10/18 96.2 kg     Intake/Output Summary (Last 24 hours) at 08/25/2018 1414 Last data filed at 08/25/2018 0400 Gross per 24 hour  Intake 1520 ml  Output -  Net 1520 ml   Physical Exam Patient is examined daily including today on 08/25/18 , exams remain the same as of yesterday except that has changed   Gen:- Awake Alert, in no apparent distress  HEENT:- [place].AT, No sclera icterus Neck-Supple Neck,No JVD,.  Lungs-  CTAB , fair symmetrical air movement CV- S1, S2 normal, regular  Abd-  +ve B.Sounds, Abd Soft,  abdominal tenderness continues to improve especially in left lower quadrant, no significant rebound or guarding at this time extremity/Skin:- No  edema, pedal pulses present  Psych-affect is appropriate, oriented x3 Neuro-no new focal deficits, no tremors   Data Review:   Micro Results Recent Results (from the past 240  hour(s))  Wet prep, genital     Status: Abnormal   Collection Time: 08/18/18 10:30 AM  Result Value Ref Range Status   Yeast Wet Prep HPF POC NONE SEEN NONE SEEN Final   Trich, Wet Prep NONE SEEN NONE SEEN Final   Clue Cells Wet Prep HPF POC NONE SEEN NONE SEEN Final   WBC, Wet Prep HPF POC FEW (A) NONE SEEN Final   Sperm NONE SEEN  Final    Comment: Performed at [facility], [address] [place], [place] [address]    Radiology Reports Ct Abdomen Pelvis W Contrast  Result Date: 08/21/2018 CLINICAL DATA:  Abdominal pain suspected diverticulitis. EXAM: CT ABDOMEN AND PELVIS WITH CONTRAST TECHNIQUE: Multidetector CT imaging of the abdomen and pelvis was performed using the standard protocol following bolus  administration of intravenous contrast. CONTRAST:  166mL OMNIPAQUE IOHEXOL 300 MG/ML  SOLN COMPARISON:  Body CT 08/16/2018 and 05/06/2018 FINDINGS: Lower chest: Hyperventilatory changes in the right lung base. Hepatobiliary: No focal liver abnormality is seen. No gallstones, gallbladder wall thickening, or biliary dilatation. Pancreas: Unremarkable. No pancreatic ductal dilatation or surrounding inflammatory changes. Spleen: Normal in size without focal abnormality. Adrenals/Urinary Tract: Adrenal glands are unremarkable. Kidneys are normal, without renal calculi, focal lesion, or hydronephrosis. Bladder is unremarkable. Stomach/Bowel: Normal appearance of the stomach and small bowel. Scattered colonic diverticulosis. The area of presumed diverticulitis in the sigmoid colon demonstrates interval worsening inflammatory changes. There is a relatively long segment of circumferential marked mucosal thickening of the involved sigmoid colon with an intramural area of hypoattenuation which measures 2.7 x 2.1 x 1.9 cm, likely representing an abscess. The appendix is long and crosses the midline with extension to the left lower quadrant. The distal part of the appendix, which abuts the abnormal  sigmoid colon, is thickened to 11 mm, fluid-filled and with indistinct borders, consistent with tip appendicitis, likely secondary to the inflammatory process of the sigmoid colon. There is marked pericolonic inflammatory stranding. Interval development of adjacent mesenteric lymphadenopathy. Small amount of free fluid tracks into the left adnexa. Vascular/Lymphatic: Likely reactive central mesenteric lymphadenopathy. Reproductive: Normal appearance of the uterus and the right adnexa. Inflammatory fluid within the left adnexa. Other: Known periumbilical fat and fluid containing anterior abdominal wall hernia. Musculoskeletal: Post left hip arthroplasty, stable. IMPRESSION: Worsening of the presumed sigmoid colon diverticulitis with marked thickening of the involved sigmoid colon, pericolonic inflammatory changes, 2.7 cm intramural abscess, and secondary tip appendicitis. Small amount of inflammatory fluid tracks into the left adnexa. Likely reactive central mesenteric lymphadenopathy. These results were called by telephone at the time of interpretation on 08/21/2018 at 4:54 pm to Dr. Roxan Hockey , who verbally acknowledged these results. Electronically Signed   By: Fidela Salisbury M.D.   On: 08/21/2018 17:13   Ct Abdomen Pelvis W Contrast  Result Date: 08/16/2018 CLINICAL DATA:  Lower abdominal pain and no midline hernia. Some low back pain as hernia repair 2014 but states hernia has recurred. Pain worse over the past 3 days. EXAM: CT ABDOMEN AND PELVIS WITH CONTRAST TECHNIQUE: Multidetector CT imaging of the abdomen and pelvis was performed using the standard protocol following bolus administration of intravenous contrast. CONTRAST:  137mL OMNIPAQUE IOHEXOL 300 MG/ML  SOLN COMPARISON:  05/06/2018 and 02/01/2018 FINDINGS: Lower chest: Calcified granuloma over the left lower lobe. Mild cardiomegaly. Hepatobiliary: Gallbladder, liver and biliary tree are normal. Pancreas: Normal. Spleen: Normal.  Adrenals/Urinary Tract: Adrenal glands are normal. Kidneys are normal in size without hydronephrosis or nephrolithiasis. Ureters and bladder are normal. Stomach/Bowel: Stomach and small bowel are normal. Appendix is within normal. Diverticulosis of the colon. There is inflammatory change adjacent a short segment of the thick-walled sigmoid colon in the left lower quadrant compatible with acute diverticulitis. There is no perforation or abscess. Vascular/Lymphatic: Normal. Reproductive: Within normal. Other: Periumbilical hernia containing peritoneal fat unchanged. Tiny amount of free pelvic fluid. Musculoskeletal: Left hip arthroplasty intact. Mild degenerate change of the right hip. IMPRESSION: Colonic diverticulosis with mild acute diverticulitis involving a short segment of the sigmoid colon in the left lower quadrant. No perforation or abscess. Known umbilical hernia containing peritoneal fat unchanged. Electronically Signed   By: Marin Olp M.D.   On: 08/16/2018 20:11   Dg Abd Acute W/chest  Result Date: 08/18/2018 CLINICAL DATA:  Abdominal pain and vomiting EXAM:  DG ABDOMEN ACUTE W/ 1V CHEST COMPARISON:  12/25/2017 chest radiograph. 05/06/2017 abdominal radiograph FINDINGS: Stable cardiomediastinal silhouette with cardiomegaly. No pneumothorax. No pleural effusion. Mild bibasilar atelectasis. No pulmonary edema. No consolidative airspace disease. No dilated small bowel loops or significant air-fluid levels. Mild colonic stool. No evidence of pneumatosis or pneumoperitoneum. No radiopaque nephrolithiasis. Left total hip arthroplasty. Lower lumbar spondylosis. IMPRESSION: 1. Stable mild cardiomegaly without pulmonary edema. 2. Mild bibasilar atelectasis. 3. Nonobstructive bowel gas pattern. Electronically Signed   By: Ilona Sorrel M.D.   On: 08/18/2018 11:54     CBC Recent Labs  Lab 08/20/18 0222 08/21/18 0335 08/22/18 1020 08/23/18 0304 08/25/18 0300  WBC 11.4* 13.4* 11.2* 9.3 7.3  HGB 11.0*  11.0* 11.3* 11.1* 11.0*  HCT 36.7 34.4* 35.9* 35.7* 35.5*  PLT 429* 419* 470* 515* 593*  MCV 82.1 80.0 80.7 80.2 80.5  MCH 24.6* 25.6* 25.4* 24.9* 24.9*  MCHC 30.0 32.0 31.5 31.1 31.0  RDW 14.4 14.2 14.2 14.2 14.4    Chemistries  Recent Labs  Lab 08/19/18 0152 08/21/18 0335 08/22/18 1020 08/23/18 0304 08/25/18 0300  NA 136 139 138 138 138  K 3.9 3.1* 3.6 3.5 3.6  CL 102 103 104 104 103  CO2 26 24 24 23 24   GLUCOSE 103* 118* 122* 117* 100*  BUN <5* <5* <5* <5* 6  CREATININE 0.70 0.69 0.69 0.78 0.73  CALCIUM 8.6* 8.6* 8.9 8.7* 8.5*   -------------------------------------------  Roxan Hockey M.D on 08/25/2018 at 2:14 PM  Pager---[phone] Go to [email or web address] - password [email or web address] for contact info  [facility] - Office  [phone]

## 2018-08-25 NOTE — Progress Notes (Signed)
[facility] Progress Note     Subjective: CC: diverticulitis Patient reports pain is improved. Having bowel function, loose stools without blood or mucus. States she actually had some solid food last night and tolerated well. Denies nausea.  Objective: Vital signs in last 24 hours: Temp:  [97.3 F (36.3 C)-98.6 F (37 C)] 97.9 F (36.6 C) (12/08 0610) Pulse Rate:  [68-87] 68 (12/08 0610) Resp:  [16-19] 19 (12/08 0610) BP: (124-147)/(80-92) 147/92 (12/08 0610) SpO2:  [94 %-96 %] 94 % (12/08 0610) Last BM Date: 08/24/18  Intake/Output from previous day: 12/07 0701 - 12/08 0700 In: 1520 [P.O.:720; I.V.:100; IV Piggyback:700] Out: -  Intake/Output this shift: No intake/output data recorded.  PE: Gen:  Alert, NAD, pleasant Card:  Regular rate and rhythm, pedal pulses 2+ BL Pulm:  Normal effort, clear to auscultation bilaterally Abd: Soft, mildly TTP in LLQ, non-distended, bowel sounds present in all 4 quadrants, no HSM Skin: warm and dry, no rashes  Psych: A&Ox3   Lab Results:  Recent Labs    08/23/18 0304 08/25/18 0300  WBC 9.3 7.3  HGB 11.1* 11.0*  HCT 35.7* 35.5*  PLT 515* 593*   BMET Recent Labs    08/23/18 0304 08/25/18 0300  NA 138 138  K 3.5 3.6  CL 104 103  CO2 23 24  GLUCOSE 117* 100*  BUN <5* 6  CREATININE 0.78 0.73  CALCIUM 8.7* 8.5*   PT/INR No results for input(s): LABPROT, INR in the last 72 hours. CMP     Component Value Date/Time   NA 138 08/25/2018 0300   NA 142 05/08/2013 0850   K 3.6 08/25/2018 0300   K 4.3 05/08/2013 0850   CL 103 08/25/2018 0300   CL 108 (H) 10/16/2012 1325   CO2 24 08/25/2018 0300   CO2 21 (L) 05/08/2013 0850   GLUCOSE 100 (H) 08/25/2018 0300   GLUCOSE 96 05/08/2013 0850   GLUCOSE 109 (H) 10/16/2012 1325   BUN 6 08/25/2018 0300   BUN 15.0 05/08/2013 0850   CREATININE 0.73 08/25/2018 0300   CREATININE 0.81 02/01/2018 0838   CREATININE 0.8 05/08/2013 0850   CALCIUM 8.5 (L) 08/25/2018 0300   CALCIUM 9.0 05/08/2013 0850   PROT 6.3 (L) 08/16/2018 1830   PROT 7.0 05/08/2013 0850   ALBUMIN 3.4 (L) 08/16/2018 1830   ALBUMIN 3.5 05/08/2013 0850   AST 15 08/16/2018 1830   AST 10 02/01/2018 0838   AST 12 05/08/2013 0850   ALT 13 08/16/2018 1830   ALT 10 02/01/2018 0838   ALT 11 05/08/2013 0850   ALKPHOS 68 08/16/2018 1830   ALKPHOS 71 05/08/2013 0850   BILITOT 0.3 08/16/2018 1830   BILITOT <0.2 (L) 02/01/2018 0838   BILITOT 0.28 05/08/2013 0850   GFRNONAA >60 08/25/2018 0300   GFRNONAA >60 02/01/2018 0838   GFRAA >60 08/25/2018 0300   GFRAA >60 02/01/2018 0838   Lipase     Component Value Date/Time   LIPASE 39 05/06/2018 1216       Studies/Results: No results found.  Anti-infectives: Anti-infectives (From admission, onward)   Start     Dose/Rate Route Frequency Ordered Stop   08/21/18 1845  ertapenem (INVANZ) 1,000 mg in sodium chloride 0.9 % 100 mL IVPB     1 g 200 mL/hr over 30 Minutes Intravenous Every 24 hours 08/21/18 1730     08/20/18 1830  piperacillin-tazobactam (ZOSYN) IVPB 3.375 g  Status:  Discontinued     3.375 g 12.5 mL/hr over 240  Minutes Intravenous Every 8 hours 08/20/18 1729 08/21/18 1730   08/18/18 2300  ciprofloxacin (CIPRO) IVPB 400 mg  Status:  Discontinued     400 mg 200 mL/hr over 60 Minutes Intravenous Every 12 hours 08/18/18 1302 08/20/18 1649   08/18/18 1500  metroNIDAZOLE (FLAGYL) IVPB 500 mg  Status:  Discontinued     500 mg 100 mL/hr over 60 Minutes Intravenous Every 8 hours 08/18/18 1302 08/20/18 1649   08/18/18 1100  ciprofloxacin (CIPRO) IVPB 400 mg     400 mg 200 mL/hr over 60 Minutes Intravenous  Once 08/18/18 1047 08/18/18 1239   08/18/18 1100  metroNIDAZOLE (FLAGYL) IVPB 500 mg     500 mg 100 mL/hr over 60 Minutes Intravenous  Once 08/18/18 1047 08/18/18 1344       Assessment/Plan GERD Asthma  Diverticulitis - WBC trending down, afeb - continue abx - soft diet - ambulate - can consider transitioning to PO abx  today - would consult with ID pharmacist - hopefully this will resolve without surgical intervention but we will continue to follow  FEN: IVF, soft diet VTE: SCDs ID: Invanz 12/4>>  LOS: 7 days    Brigid Re , [facility] 08/25/2018, 9:21 AM Pager: [phone] Consults: [phone] Mon-Fri 7:00 am-4:30 pm Sat-Sun 7:00 am-11:30 am

## 2018-08-26 ENCOUNTER — Telehealth: Payer: Self-pay | Admitting: Hematology

## 2018-08-26 MED ORDER — SODIUM CHLORIDE 0.9 % IV SOLN: 1.0000 g | Freq: Once | INTRAVENOUS | Status: DC

## 2018-08-26 MED ORDER — ACETAMINOPHEN 325 MG PO TABS: 650.0000 mg | ORAL_TABLET | Freq: Four times a day (QID) | ORAL | 0 refills | Status: DC | PRN

## 2018-08-26 MED ORDER — OXYCODONE-ACETAMINOPHEN 5-325 MG PO TABS: 1.0000 | ORAL_TABLET | ORAL | 0 refills | Status: DC | PRN

## 2018-08-26 MED ORDER — CEFDINIR 300 MG PO CAPS: 300.0000 mg | ORAL_CAPSULE | Freq: Two times a day (BID) | ORAL | 0 refills | Status: AC

## 2018-08-26 MED ORDER — PANTOPRAZOLE SODIUM 40 MG PO TBEC: 40.0000 mg | DELAYED_RELEASE_TABLET | Freq: Every day | ORAL | 1 refills | Status: DC

## 2018-08-26 MED ORDER — SODIUM CHLORIDE 0.9 % IV SOLN
INTRAVENOUS | Status: DC | PRN
  Administered 2018-08-26: 250 mL via INTRAVENOUS

## 2018-08-26 MED ORDER — METRONIDAZOLE 500 MG PO TABS: 500.0000 mg | ORAL_TABLET | Freq: Three times a day (TID) | ORAL | 0 refills | Status: AC

## 2018-08-26 MED ORDER — FLORANEX PO PACK: 1.0000 g | PACK | Freq: Three times a day (TID) | ORAL | 0 refills | Status: DC

## 2018-08-26 MED ORDER — SODIUM CHLORIDE 0.9 % IV SOLN
1.0000 g | Freq: Once | INTRAVENOUS | Status: AC
  Administered 2018-08-26: 1000 mg via INTRAVENOUS
  Filled 2018-08-26: qty 1

## 2018-08-26 MED ORDER — ONDANSETRON HCL 4 MG PO TABS: 4.0000 mg | ORAL_TABLET | Freq: Four times a day (QID) | ORAL | 0 refills | Status: DC | PRN

## 2018-08-26 NOTE — Progress Notes (Signed)
Dana Kennedy to be D/C'd  per MD order. Discussed with the patient and all questions fully answered.  VSS, Skin clean, dry and intact without evidence of skin break down, no evidence of skin tears noted.  IV catheter discontinued. Site without signs and symptoms of complications. Dressing and pressure applied.  An After Visit Summary was printed and given to the patient. Patient received prescription.  D/c education completed with patient/family including follow up instructions, medication list, d/c activities limitations if indicated, with other d/c instructions as indicated by MD - patient able to verbalize understanding, all questions fully answered.   Patient instructed to return to ED, call 911, or call MD for any changes in condition.   Patient to walk and D/C home via private auto.  [place] Lanae Boast, RN

## 2018-08-26 NOTE — Discharge Instructions (Signed)
1)Take Omnicef/Cefdinir and Flagyl/metronidazole antibiotic as prescribed for diverticulitis/infection of the bowel--- absolutely no alcohol while taking metronidazole 2)Follow-up with  primary care physician within a week for reevaluation including repeat CBC/complete blood count repeat BMP/kidney electrolyte test 3)postmenopausal spotting--- patient will need pelvic ultrasound after discharge, she will need endometrial aspiration/curettage to rule out malignancy--follow-up with primary care physician for referral to OB/GYN

## 2018-08-26 NOTE — Telephone Encounter (Signed)
R/s appt per 12/3 message- pt is aware of appt date and time

## 2018-08-26 NOTE — Discharge Summary (Signed)
Dana Kennedy, is a 52 y.o. female  DOB [birth year]  MRN [id number].  Admission date:  08/18/2018  Admitting Physician  No admitting provider for patient encounter.  Discharge Date:  08/26/2018   Primary MD  [facility] [place]  Recommendations for primary care physician for things to follow:  1)Take Omnicef/Cefdinir and Flagyl/metronidazole antibiotic as prescribed for diverticulitis/infection of the bowel--- absolutely no alcohol while taking metronidazole 2)Follow-up with  primary care physician within a week for reevaluation including repeat CBC/complete blood count repeat BMP/kidney electrolyte test   Admission Diagnosis  Gastroesophageal reflux disease without esophagitis [K21.9] Diverticulitis of large intestine without perforation or abscess without bleeding [A57.90] Uncomplicated asthma, unspecified asthma severity, unspecified whether persistent [J45.909]   Discharge Diagnosis  Gastroesophageal reflux disease without esophagitis [K21.9] Diverticulitis of large intestine without perforation or abscess without bleeding [X83.33] Uncomplicated asthma, unspecified asthma severity, unspecified whether persistent [J45.909]    Principal Problem:   Acute diverticulitis Active Problems:   GERD (gastroesophageal reflux disease)   Asthma   Vaginal spotting      Past Medical History:  Diagnosis Date  . Anemia   . Asthma   . Chronic lower back pain   . GERD (gastroesophageal reflux disease)   . History of blood transfusion    "HgB low w/lupus flareup" (08/19/2018)  . History of ITP   . Rheumatoid arthritis ([place])    "legs; all my bones" (08/19/2018)  . SLE (systemic lupus erythematosus) ([place])     Past Surgical History:  Procedure Laterality Date  . HERNIA REPAIR    . stomach ulcer surgery  2008  . TONSILLECTOMY    . TOTAL HIP ARTHROPLASTY  06/14/2012   Procedure: TOTAL HIP  ARTHROPLASTY ANTERIOR APPROACH;  Surgeon: Mcarthur Rossetti, MD;  Location: [facility];  Service: Orthopedics;  Laterality: Left;  Left Total Hip Arthroplasty, Anterior Approach C-Arm  . TUBAL LIGATION    . UMBILICAL HERNIA REPAIR         HPI  from the history and physical done on the day of admission:    Patient Coming From: home  Chief Complaint: Abdominal pain  HPI: Dana Kennedy is a 52 y.o. female with a history of asthma, GERD, history of ITP, lupus.  Patient seen 2 days ago for abdominal pain and diagnosed with mild diverticulitis without perforation or abscess.  The patient was prescribed ciprofloxacin and Flagyl to take twice a day.  The patient went home and proceeded to have vomiting yesterday and today.  She has been intolerant of food and liquids and has had difficulty keeping her medications down.  She denies fevers, chills.  Her abdominal pain has been increasing.  Is nonradiating but in the lower quadrants bilaterally, right greater than left.  Initially, she does note some mild postmenopausal bleeding that started this morning.  She characterizes it as spotting.  Emergency Department Course: Patient started on IV Cipro and Flagyl. WBC slightly increased a 11k.    Hospital Course:   Brief Summary:- 52 year old past medical  history relevant for ITP, lupus, GERD and asthma along with a known umbilical hernia which she is awaiting surgical repair with [facility] [place] [facility] admitted on 08/18/2018 with worsening sigmoid diverticulitis after failing oral Flagyl/Cipro combination from 08/16/2018 to 08/18/2018  Plan: 1)Acute Sigmoid Diverticulitis--- clinically continues to improve, tolerating solid diet well, afebrile, no further leukocytosis, and abdominal pain is much improved, had formed BM,,   please note that patient failed to improve on Cipro and Flagyl combination from 1129 through 08/18/18 , patient was then treated with IV Zosyn from 08/18/2018 through 08/21/2018,  previously discussed discussed with on-call for the ID physician Dr. Linus Salmons on 08/21/2018... Treated with IV Invanz 1 g from 08/21/2018 through 08/26/2018 inclusive...  surgical consult appreciated, repeat CT abdomen  shown  worsening sigmoid diverticulitis with  intramural abscess,  possible involvement of the appendix as well as left adnexa involvement... Discussed with general radiologist and interventional radiologist on 08/21/2018 apparently the 2.7 cm sigmoid abscess is intramural and not amenable to IR drainage... Discussed with general surgery  they want to hold off on surgical intervention as long as she improves on IV Invanz ....  Fortunately patient improved significantly with IV Invanz, will discharge home on 08/26/2018 on p.o. Omnicef and Flagyl  2)postmenopausal spotting--- patient will need pelvic ultrasound after discharge, she will need endometrial aspiration/curettage to rule out malignancy--follow-up with primary care physician for referral to OB/GYN  3)H/o ITP--- monitor platelet count closely  4) periumbilical hernia----not very symptomatic at this time,    5)Asthma--- stable, no acute exacerbation, continue bronchodilators  6)H/o LUpus--- not symptomatic at this time    Code Status : Full  Disposition Plan  : TBD  Consults  :  Phone Consult with ID/Gen Surgery  Discharge Condition: stable  Follow UP  Follow-up Information    [facility] Call.   Specialty:  General Surgery Why:  We are working on your appointment and someone from our office should contact with the appointment information. Contact information: [address] [place] [phone]          Diet and Activity recommendation:  As advised  Discharge Instructions    Discharge Instructions    Call MD for:  difficulty breathing, headache or visual disturbances   Complete by:  As directed    Call MD for:  persistant dizziness or  light-headedness   Complete by:  As directed    Call MD for:  persistant nausea and vomiting   Complete by:  As directed    Call MD for:  severe uncontrolled pain   Complete by:  As directed    Call MD for:  temperature >100.4   Complete by:  As directed    Diet - low sodium heart healthy   Complete by:  As directed    Discharge instructions   Complete by:  As directed    1)Take Omnicef/Cefdinir and Flagyl/metronidazole antibiotic as prescribed for diverticulitis/infection of the bowel--- absolutely no alcohol while taking metronidazole 2)Follow-up with  primary care physician within a week for reevaluation including repeat CBC/complete blood count repeat BMP/kidney electrolyte test 3)postmenopausal spotting--- patient will need pelvic ultrasound after discharge, she will need endometrial aspiration/curettage to rule out malignancy--follow-up with primary care physician for referral to OB/GYN   Increase activity slowly   Complete by:  As directed         Discharge Medications     Allergies as of 08/26/2018      Reactions  Allegra [fexofenadine]    Pt states she began having spells of dry cough after taking this medication   Aspirin    Causes bleeding   Eszopiclone Swelling   Felt like throat was closing. Not sure if it was related to this medication or not. lunesta   Restoril [temazepam]    Headaches       Medication List    STOP taking these medications   ciprofloxacin 500 MG tablet Commonly known as:  CIPRO   omeprazole 20 MG capsule Commonly known as:  PRILOSEC   ondansetron 4 MG disintegrating tablet Commonly known as:  ZOFRAN-ODT     TAKE these medications   acetaminophen 325 MG tablet Commonly known as:  TYLENOL Take 2 tablets (650 mg total) by mouth every 6 (six) hours as needed for mild pain (or Fever >/= 101).   albuterol 108 (90 Base) MCG/ACT inhaler Commonly known as:  PROVENTIL HFA;VENTOLIN HFA Inhale 2 puffs into the lungs every 4 (four) hours as  needed for wheezing or shortness of breath.   budesonide-formoterol 160-4.5 MCG/ACT inhaler Commonly known as:  SYMBICORT Inhale 2 puffs into the lungs 2 (two) times daily.   cefdinir 300 MG capsule Commonly known as:  OMNICEF Take 1 capsule (300 mg total) by mouth 2 (two) times daily for 7 days.   chlorpheniramine 4 MG tablet Commonly known as:  CHLOR-TRIMETON Take 2 tablets (8 mg total) by mouth 3 (three) times daily.   fluticasone 50 MCG/ACT nasal spray Commonly known as:  FLONASE Place 2 sprays into both nostrils daily.   fluticasone furoate-vilanterol 200-25 MCG/INH Aepb Commonly known as:  BREO ELLIPTA Inhale 1 puff into the lungs daily.   hydrocortisone cream 1 % Apply 1 application topically as needed for itching.   lactobacillus Pack Take 1 packet (1 g total) by mouth 3 (three) times daily with meals.   metroNIDAZOLE 500 MG tablet Commonly known as:  FLAGYL Take 1 tablet (500 mg total) by mouth 3 (three) times daily for 7 days. What changed:  when to take this   Nicotine 21-14-7 MG/24HR Kit Take as directed   ondansetron 4 MG tablet Commonly known as:  ZOFRAN Take 1 tablet (4 mg total) by mouth every 6 (six) hours as needed for nausea.   oxyCODONE-acetaminophen 5-325 MG tablet Commonly known as:  PERCOCET/ROXICET Take 1 tablet by mouth every 4 (four) hours as needed for severe pain.   pantoprazole 40 MG tablet Commonly known as:  PROTONIX Take 1 tablet (40 mg total) by mouth daily. What changed:    medication strength  how much to take   sucralfate 1 g tablet Commonly known as:  CARAFATE Take 1 tablet (1 g total) by mouth 4 (four) times daily.       Major procedures and Radiology Reports - PLEASE review detailed and final reports for all details, in brief -   Ct Abdomen Pelvis W Contrast  Result Date: 08/21/2018 CLINICAL DATA:  Abdominal pain suspected diverticulitis. EXAM: CT ABDOMEN AND PELVIS WITH CONTRAST TECHNIQUE: Multidetector CT imaging  of the abdomen and pelvis was performed using the standard protocol following bolus administration of intravenous contrast. CONTRAST:  11m OMNIPAQUE IOHEXOL 300 MG/ML  SOLN COMPARISON:  Body CT 08/16/2018 and 05/06/2018 FINDINGS: Lower chest: Hyperventilatory changes in the right lung base. Hepatobiliary: No focal liver abnormality is seen. No gallstones, gallbladder wall thickening, or biliary dilatation. Pancreas: Unremarkable. No pancreatic ductal dilatation or surrounding inflammatory changes. Spleen: Normal in size without focal abnormality. Adrenals/Urinary Tract:  Adrenal glands are unremarkable. Kidneys are normal, without renal calculi, focal lesion, or hydronephrosis. Bladder is unremarkable. Stomach/Bowel: Normal appearance of the stomach and small bowel. Scattered colonic diverticulosis. The area of presumed diverticulitis in the sigmoid colon demonstrates interval worsening inflammatory changes. There is a relatively long segment of circumferential marked mucosal thickening of the involved sigmoid colon with an intramural area of hypoattenuation which measures 2.7 x 2.1 x 1.9 cm, likely representing an abscess. The appendix is long and crosses the midline with extension to the left lower quadrant. The distal part of the appendix, which abuts the abnormal sigmoid colon, is thickened to 11 mm, fluid-filled and with indistinct borders, consistent with tip appendicitis, likely secondary to the inflammatory process of the sigmoid colon. There is marked pericolonic inflammatory stranding. Interval development of adjacent mesenteric lymphadenopathy. Small amount of free fluid tracks into the left adnexa. Vascular/Lymphatic: Likely reactive central mesenteric lymphadenopathy. Reproductive: Normal appearance of the uterus and the right adnexa. Inflammatory fluid within the left adnexa. Other: Known periumbilical fat and fluid containing anterior abdominal wall hernia. Musculoskeletal: Post left hip  arthroplasty, stable. IMPRESSION: Worsening of the presumed sigmoid colon diverticulitis with marked thickening of the involved sigmoid colon, pericolonic inflammatory changes, 2.7 cm intramural abscess, and secondary tip appendicitis. Small amount of inflammatory fluid tracks into the left adnexa. Likely reactive central mesenteric lymphadenopathy. These results were called by telephone at the time of interpretation on 08/21/2018 at 4:54 pm to Dr. Roxan Hockey , who verbally acknowledged these results. Electronically Signed   By: Fidela Salisbury M.D.   On: 08/21/2018 17:13   Ct Abdomen Pelvis W Contrast  Result Date: 08/16/2018 CLINICAL DATA:  Lower abdominal pain and no midline hernia. Some low back pain as hernia repair 2014 but states hernia has recurred. Pain worse over the past 3 days. EXAM: CT ABDOMEN AND PELVIS WITH CONTRAST TECHNIQUE: Multidetector CT imaging of the abdomen and pelvis was performed using the standard protocol following bolus administration of intravenous contrast. CONTRAST:  159m OMNIPAQUE IOHEXOL 300 MG/ML  SOLN COMPARISON:  05/06/2018 and 02/01/2018 FINDINGS: Lower chest: Calcified granuloma over the left lower lobe. Mild cardiomegaly. Hepatobiliary: Gallbladder, liver and biliary tree are normal. Pancreas: Normal. Spleen: Normal. Adrenals/Urinary Tract: Adrenal glands are normal. Kidneys are normal in size without hydronephrosis or nephrolithiasis. Ureters and bladder are normal. Stomach/Bowel: Stomach and small bowel are normal. Appendix is within normal. Diverticulosis of the colon. There is inflammatory change adjacent a short segment of the thick-walled sigmoid colon in the left lower quadrant compatible with acute diverticulitis. There is no perforation or abscess. Vascular/Lymphatic: Normal. Reproductive: Within normal. Other: Periumbilical hernia containing peritoneal fat unchanged. Tiny amount of free pelvic fluid. Musculoskeletal: Left hip arthroplasty intact. Mild  degenerate change of the right hip. IMPRESSION: Colonic diverticulosis with mild acute diverticulitis involving a short segment of the sigmoid colon in the left lower quadrant. No perforation or abscess. Known umbilical hernia containing peritoneal fat unchanged. Electronically Signed   By: DMarin OlpM.D.   On: 08/16/2018 20:11   Dg Abd Acute W/chest  Result Date: 08/18/2018 CLINICAL DATA:  Abdominal pain and vomiting EXAM: DG ABDOMEN ACUTE W/ 1V CHEST COMPARISON:  12/25/2017 chest radiograph. 05/06/2017 abdominal radiograph FINDINGS: Stable cardiomediastinal silhouette with cardiomegaly. No pneumothorax. No pleural effusion. Mild bibasilar atelectasis. No pulmonary edema. No consolidative airspace disease. No dilated small bowel loops or significant air-fluid levels. Mild colonic stool. No evidence of pneumatosis or pneumoperitoneum. No radiopaque nephrolithiasis. Left total hip arthroplasty. Lower lumbar spondylosis. IMPRESSION:  1. Stable mild cardiomegaly without pulmonary edema. 2. Mild bibasilar atelectasis. 3. Nonobstructive bowel gas pattern. Electronically Signed   By: Ilona Sorrel M.D.   On: 08/18/2018 11:54    Micro Results   Recent Results (from the past 240 hour(s))  Wet prep, genital     Status: Abnormal   Collection Time: 08/18/18 10:30 AM  Result Value Ref Range Status   Yeast Wet Prep HPF POC NONE SEEN NONE SEEN Final   Trich, Wet Prep NONE SEEN NONE SEEN Final   Clue Cells Wet Prep HPF POC NONE SEEN NONE SEEN Final   WBC, Wet Prep HPF POC FEW (A) NONE SEEN Final   Sperm NONE SEEN  Final    Comment: Performed at [facility], [place] [address] [place], [place] [address]       Today   Subjective    Dana Kennedy today has no new complaints, abdominal pain is better, eating and drinking well, had formed BM, no fevers, no chills, no nausea or vomiting, husband at bedside, questions answered, patient eager to go home          Patient has been seen and examined prior  to discharge   Objective   Blood pressure 122/67, pulse 76, temperature 97.9 F (36.6 C), temperature source Oral, resp. rate 20, height '5\' 4"'  (1.626 m), weight 97.1 kg, last menstrual period 02/03/2018, SpO2 98 %.   Intake/Output Summary (Last 24 hours) at 08/26/2018 1702 Last data filed at 08/26/2018 1500 Gross per 24 hour  Intake 1518.59 ml  Output -  Net 1518.59 ml    Exam Gen:- Awake Alert, in no apparent distress  HEENT:- [place].AT, No sclera icterus Neck-Supple Neck,No JVD,.  Lungs-  CTAB , fair symmetrical air movement CV- S1, S2 normal, regular  Abd-  +ve B.Sounds, Abd Soft,  abdominal tenderness overall is improved no significant rebound or guarding at this time extremity/Skin:- No  edema, pedal pulses present  Psych-affect is appropriate, oriented x3 Neuro-no new focal deficits, no tremors   Data Review   CBC w Diff:  Lab Results  Component Value Date   WBC 7.3 08/25/2018   HGB 11.0 (L) 08/25/2018   HGB 13.0 02/01/2018   HGB 13.3 05/01/2016   HCT 35.5 (L) 08/25/2018   HCT 40.3 05/01/2016   PLT 593 (H) 08/25/2018   PLT 375 02/01/2018   PLT 331 05/01/2016   LYMPHOPCT 24 08/18/2018   LYMPHOPCT 32.2 05/01/2016   MONOPCT 9 08/18/2018   MONOPCT 10.9 05/01/2016   EOSPCT 2 08/18/2018   EOSPCT 1.8 05/01/2016   BASOPCT 0 08/18/2018   BASOPCT 0.3 05/01/2016    CMP:  Lab Results  Component Value Date   NA 138 08/25/2018   NA 142 05/08/2013   K 3.6 08/25/2018   K 4.3 05/08/2013   CL 103 08/25/2018   CL 108 (H) 10/16/2012   CO2 24 08/25/2018   CO2 21 (L) 05/08/2013   BUN 6 08/25/2018   BUN 15.0 05/08/2013   CREATININE 0.73 08/25/2018   CREATININE 0.81 02/01/2018   CREATININE 0.8 05/08/2013   GLU 112 (H) 11/05/2012   PROT 6.3 (L) 08/16/2018   PROT 7.0 05/08/2013   ALBUMIN 3.4 (L) 08/16/2018   ALBUMIN 3.5 05/08/2013   BILITOT 0.3 08/16/2018   BILITOT <0.2 (L) 02/01/2018   BILITOT 0.28 05/08/2013   ALKPHOS 68 08/16/2018   ALKPHOS 71 05/08/2013   AST 15  08/16/2018   AST 10 02/01/2018   AST 12 05/08/2013  ALT 13 08/16/2018   ALT 10 02/01/2018   ALT 11 05/08/2013  .   Total Discharge time is about 33 minutes  Roxan Hockey M.D on 08/26/2018 at 5:02 PM  Pager---[phone]  Go to [email or web address] - password [email or web address] for contact info  [facility] - Office  [phone]

## 2018-08-26 NOTE — Progress Notes (Signed)
[facility] Progress Note     Subjective: CC-  Sitting up in bed, husband at bedside. No complaints. Denies abdominal pain, nausea, vomiting. Tolerating solid food. BM this morning, stool solid.  VSS. Still on IV invanz.  Objective: Vital signs in last 24 hours: Temp:  [97.9 F (36.6 C)-98.4 F (36.9 C)] 98.2 F (36.8 C) (12/09 0528) Pulse Rate:  [76-92] 76 (12/09 0528) Resp:  [18-19] 18 (12/09 0528) BP: (132-134)/(78-95) 134/90 (12/09 0528) SpO2:  [93 %-99 %] 99 % (12/09 0528) Last BM Date: 08/25/18  Intake/Output from previous day: 12/08 0701 - 12/09 0700 In: 2204.8 [P.O.:560; I.V.:1644.8] Out: -  Intake/Output this shift: No intake/output data recorded.  PE: Gen:  Alert, NAD, pleasant HEENT: EOM's intact, pupils equal and round Card:  RRR Pulm:  CTAB, no W/R/R, effort normal Abd: Soft, mild distension, +BS, no HSM, no hernia, nontender Ext:  No BLE edema Psych: A&Ox3  Skin: no rashes noted, warm and dry  Lab Results:  Recent Labs    08/25/18 0300  WBC 7.3  HGB 11.0*  HCT 35.5*  PLT 593*   BMET Recent Labs    08/25/18 0300  NA 138  K 3.6  CL 103  CO2 24  GLUCOSE 100*  BUN 6  CREATININE 0.73  CALCIUM 8.5*   PT/INR No results for input(s): LABPROT, INR in the last 72 hours. CMP     Component Value Date/Time   NA 138 08/25/2018 0300   NA 142 05/08/2013 0850   K 3.6 08/25/2018 0300   K 4.3 05/08/2013 0850   CL 103 08/25/2018 0300   CL 108 (H) 10/16/2012 1325   CO2 24 08/25/2018 0300   CO2 21 (L) 05/08/2013 0850   GLUCOSE 100 (H) 08/25/2018 0300   GLUCOSE 96 05/08/2013 0850   GLUCOSE 109 (H) 10/16/2012 1325   BUN 6 08/25/2018 0300   BUN 15.0 05/08/2013 0850   CREATININE 0.73 08/25/2018 0300   CREATININE 0.81 02/01/2018 0838   CREATININE 0.8 05/08/2013 0850   CALCIUM 8.5 (L) 08/25/2018 0300   CALCIUM 9.0 05/08/2013 0850   PROT 6.3 (L) 08/16/2018 1830   PROT 7.0 05/08/2013 0850   ALBUMIN 3.4 (L) 08/16/2018 1830   ALBUMIN  3.5 05/08/2013 0850   AST 15 08/16/2018 1830   AST 10 02/01/2018 0838   AST 12 05/08/2013 0850   ALT 13 08/16/2018 1830   ALT 10 02/01/2018 0838   ALT 11 05/08/2013 0850   ALKPHOS 68 08/16/2018 1830   ALKPHOS 71 05/08/2013 0850   BILITOT 0.3 08/16/2018 1830   BILITOT <0.2 (L) 02/01/2018 0838   BILITOT 0.28 05/08/2013 0850   GFRNONAA >60 08/25/2018 0300   GFRNONAA >60 02/01/2018 0838   GFRAA >60 08/25/2018 0300   GFRAA >60 02/01/2018 0838   Lipase     Component Value Date/Time   LIPASE 39 05/06/2018 1216       Studies/Results: No results found.  Anti-infectives: Anti-infectives (From admission, onward)   Start     Dose/Rate Route Frequency Ordered Stop   08/26/18 1400  ertapenem (INVANZ) 1,000 mg in sodium chloride 0.9 % 100 mL IVPB     1 g 200 mL/hr over 30 Minutes Intravenous  Once 08/26/18 1043     08/26/18 1045  ertapenem (INVANZ) 1,000 mg in sodium chloride 0.9 % 100 mL IVPB  Status:  Discontinued     1 g 200 mL/hr over 30 Minutes Intravenous  Once 08/26/18 1042 08/26/18 1043   08/21/18 1845  ertapenem (INVANZ) 1,000 mg in sodium chloride 0.9 % 100 mL IVPB  Status:  Discontinued     1 g 200 mL/hr over 30 Minutes Intravenous Every 24 hours 08/21/18 1730 08/26/18 1041   08/20/18 1830  piperacillin-tazobactam (ZOSYN) IVPB 3.375 g  Status:  Discontinued     3.375 g 12.5 mL/hr over 240 Minutes Intravenous Every 8 hours 08/20/18 1729 08/21/18 1730   08/18/18 2300  ciprofloxacin (CIPRO) IVPB 400 mg  Status:  Discontinued     400 mg 200 mL/hr over 60 Minutes Intravenous Every 12 hours 08/18/18 1302 08/20/18 1649   08/18/18 1500  metroNIDAZOLE (FLAGYL) IVPB 500 mg  Status:  Discontinued     500 mg 100 mL/hr over 60 Minutes Intravenous Every 8 hours 08/18/18 1302 08/20/18 1649   08/18/18 1100  ciprofloxacin (CIPRO) IVPB 400 mg     400 mg 200 mL/hr over 60 Minutes Intravenous  Once 08/18/18 1047 08/18/18 1239   08/18/18 1100  metroNIDAZOLE (FLAGYL) IVPB 500 mg     500  mg 100 mL/hr over 60 Minutes Intravenous  Once 08/18/18 1047 08/18/18 1344       Assessment/Plan GERD Asthma  Sigmoid diverticulitis with intramural abscess - WBC normalized, afebrile - pain resolved, tolerated soft diet and having bowel function - Would recommend transitioning patient to oral antibiotics, if she tolerates this then she is stable for discharge from surgical standpoint. I will arrange f/u in our office.  FEN: IVF, soft diet VTE: SCDs ID: Invanz 12/4>>   LOS: 8 days    Wellington Hampshire , [facility] 08/26/2018, 11:33 AM Pager: [phone] Mon 7:00 am -11:30 AM Tues-Fri 7:00 am-4:30 pm Sat-Sun 7:00 am-11:30 am

## 2018-08-31 ENCOUNTER — Other Ambulatory Visit: Payer: Self-pay

## 2018-08-31 ENCOUNTER — Emergency Department (HOSPITAL_COMMUNITY): Payer: Medicaid Other

## 2018-08-31 ENCOUNTER — Emergency Department (HOSPITAL_COMMUNITY)
Admission: EM | Admit: 2018-08-31 | Discharge: 2018-08-31 | Disposition: A | Discharge status: Home / Self Care | Payer: Medicaid Other | Attending: Emergency Medicine | Admitting: Emergency Medicine

## 2018-08-31 ENCOUNTER — Encounter (HOSPITAL_COMMUNITY): Payer: Self-pay

## 2018-08-31 DIAGNOSIS — Z87891 Personal history of nicotine dependence: Secondary | ICD-10-CM | POA: Insufficient documentation

## 2018-08-31 DIAGNOSIS — K5792 Diverticulitis of intestine, part unspecified, without perforation or abscess without bleeding: Secondary | ICD-10-CM

## 2018-08-31 DIAGNOSIS — K5732 Diverticulitis of large intestine without perforation or abscess without bleeding: Secondary | ICD-10-CM | POA: Insufficient documentation

## 2018-08-31 DIAGNOSIS — J45909 Unspecified asthma, uncomplicated: Secondary | ICD-10-CM | POA: Insufficient documentation

## 2018-08-31 DIAGNOSIS — R1032 Left lower quadrant pain: Secondary | ICD-10-CM | POA: Diagnosis present

## 2018-08-31 DIAGNOSIS — Z79899 Other long term (current) drug therapy: Secondary | ICD-10-CM | POA: Insufficient documentation

## 2018-08-31 LAB — URINALYSIS, ROUTINE W REFLEX MICROSCOPIC
Bacteria, UA: NONE SEEN
Bilirubin Urine: NEGATIVE
Glucose, UA: NEGATIVE mg/dL
Ketones, ur: NEGATIVE mg/dL
NITRITE: NEGATIVE
Protein, ur: NEGATIVE mg/dL
Specific Gravity, Urine: 1.024 (ref 1.005–1.030)
pH: 6 (ref 5.0–8.0)

## 2018-08-31 LAB — CBC WITH DIFFERENTIAL/PLATELET
Abs Immature Granulocytes: 0.03 10*3/uL (ref 0.00–0.07)
BASOS ABS: 0 10*3/uL (ref 0.0–0.1)
Basophils Relative: 1 %
Eosinophils Absolute: 0.3 10*3/uL (ref 0.0–0.5)
Eosinophils Relative: 4 %
HCT: 39.4 % (ref 36.0–46.0)
Hemoglobin: 11.7 g/dL — ABNORMAL LOW (ref 12.0–15.0)
Immature Granulocytes: 0 %
Lymphocytes Relative: 37 %
Lymphs Abs: 2.5 10*3/uL (ref 0.7–4.0)
MCH: 24.6 pg — ABNORMAL LOW (ref 26.0–34.0)
MCHC: 29.7 g/dL — ABNORMAL LOW (ref 30.0–36.0)
MCV: 82.8 fL (ref 80.0–100.0)
Monocytes Absolute: 0.6 10*3/uL (ref 0.1–1.0)
Monocytes Relative: 9 %
NRBC: 0 % (ref 0.0–0.2)
Neutro Abs: 3.3 10*3/uL (ref 1.7–7.7)
Neutrophils Relative %: 49 %
Platelets: 716 10*3/uL — ABNORMAL HIGH (ref 150–400)
RBC: 4.76 MIL/uL (ref 3.87–5.11)
RDW: 14.5 % (ref 11.5–15.5)
WBC: 6.8 10*3/uL (ref 4.0–10.5)

## 2018-08-31 LAB — COMPREHENSIVE METABOLIC PANEL
ALT: 13 U/L (ref 0–44)
ANION GAP: 9 (ref 5–15)
AST: 12 U/L — ABNORMAL LOW (ref 15–41)
Albumin: 3.2 g/dL — ABNORMAL LOW (ref 3.5–5.0)
Alkaline Phosphatase: 69 U/L (ref 38–126)
BUN: 12 mg/dL (ref 6–20)
CO2: 25 mmol/L (ref 22–32)
Calcium: 9 mg/dL (ref 8.9–10.3)
Chloride: 110 mmol/L (ref 98–111)
Creatinine, Ser: 0.68 mg/dL (ref 0.44–1.00)
GFR calc Af Amer: >60 mL/min (ref >60)
GFR calc non Af Amer: >60 mL/min (ref >60)
Glucose, Bld: 108 mg/dL — ABNORMAL HIGH (ref 70–99)
Potassium: 3.9 mmol/L (ref 3.5–5.1)
Sodium: 144 mmol/L (ref 135–145)
TOTAL PROTEIN: 5.9 g/dL — AB (ref 6.5–8.1)
Total Bilirubin: 0.2 mg/dL — ABNORMAL LOW (ref 0.3–1.2)

## 2018-08-31 LAB — I-STAT BETA HCG BLOOD, ED (MC, WL, AP ONLY): I-stat hCG, quantitative: <5 m[IU]/mL (ref <5)

## 2018-08-31 LAB — I-STAT CG4 LACTIC ACID, ED: Lactic Acid, Venous: 1.16 mmol/L (ref 0.5–1.9)

## 2018-08-31 LAB — LIPASE, BLOOD: Lipase: 65 U/L — ABNORMAL HIGH (ref 11–51)

## 2018-08-31 MED ORDER — MORPHINE SULFATE (PF) 4 MG/ML IV SOLN
4.0000 mg | Freq: Once | INTRAVENOUS | Status: AC
  Administered 2018-08-31: 4 mg via INTRAVENOUS
  Filled 2018-08-31: qty 1

## 2018-08-31 MED ORDER — ONDANSETRON HCL 4 MG/2ML IJ SOLN
4.0000 mg | Freq: Once | INTRAMUSCULAR | Status: AC
  Administered 2018-08-31: 4 mg via INTRAVENOUS
  Filled 2018-08-31: qty 2

## 2018-08-31 MED ORDER — OXYCODONE-ACETAMINOPHEN 5-325 MG PO TABS
1.0000 | ORAL_TABLET | Freq: Once | ORAL | Status: AC
  Administered 2018-08-31: 1 via ORAL
  Filled 2018-08-31: qty 1

## 2018-08-31 MED ORDER — IOHEXOL 300 MG/ML  SOLN
100.0000 mL | Freq: Once | INTRAMUSCULAR | Status: AC | PRN
  Administered 2018-08-31: 100 mL via INTRAVENOUS

## 2018-08-31 MED ORDER — OXYCODONE-ACETAMINOPHEN 5-325 MG PO TABS: 1.0000 | ORAL_TABLET | ORAL | 0 refills | Status: DC | PRN

## 2018-08-31 MED ORDER — SODIUM CHLORIDE 0.9 % IV BOLUS
1000.0000 mL | Freq: Once | INTRAVENOUS | Status: AC
  Administered 2018-08-31: 1000 mL via INTRAVENOUS

## 2018-08-31 NOTE — ED Triage Notes (Signed)
Pt BIB POV for eval of ongoing LLQ abd pain. Pt recently d/c'd from here for diverticulitis flare. Pt reports dc'd on home abx and pain medication that she reports are not helping with pain. Pt also reports concern for abscess last time she was here. Pt reports hx of umbilical hernia, which she states "i'm not sure if that's the cause of my pain". Pt endorses nausea, no vomiting.

## 2018-08-31 NOTE — Discharge Instructions (Signed)
Your work-up today was overall reassuring.  The one finding we discussed that may be slightly worsened was the possibility of a microperforation however given the overall improvement in your imaging and your improvement in symptoms after medications, we feel you are safe for discharge home after our shared decision-making conversation.  Please continue the antibiotics and your nausea medicine.  Please use the pain medicine prescription was provided to help with discomfort.  Please follow-up with your primary doctor in the next several days.  If any symptoms change or worsen or get or you cannot tolerate eating and drinking, please return to the nearest emergency department for management.

## 2018-08-31 NOTE — ED Provider Notes (Signed)
[facility] DEPARTMENT Provider Note   CSN: [id number] Arrival date & time: 08/31/18  1911     History   Chief Complaint Chief Complaint  Patient presents with  . Abdominal Pain    HPI Dana Kennedy is a 52 y.o. female.  The history is provided by the patient and medical records. No language interpreter was used.  Abdominal Pain   This is a recurrent problem. The current episode started 2 days ago. The problem occurs constantly. The problem has not changed since onset.The pain is associated with an unknown factor. The pain is located in the LLQ and generalized abdominal region. The quality of the pain is aching and sharp. The pain is at a severity of 10/10. The pain is severe. Associated symptoms include nausea and constipation. Pertinent negatives include fever, diarrhea, vomiting, dysuria, frequency, hematuria and headaches. The symptoms are aggravated by palpation and eating. Nothing relieves the symptoms. Past workup includes CT scan. Her past medical history is significant for GERD. Past medical history comments: diverticulitis.    Past Medical History:  Diagnosis Date  . Anemia   . Asthma   . Chronic lower back pain   . GERD (gastroesophageal reflux disease)   . History of blood transfusion    "HgB low w/lupus flareup" (08/19/2018)  . History of ITP   . Rheumatoid arthritis ([place])    "legs; all my bones" (08/19/2018)  . SLE (systemic lupus erythematosus) ([place])     Patient Active Problem List   Diagnosis Date Noted  . Acute diverticulitis 08/18/2018  . Vaginal spotting 08/18/2018  . GERD (gastroesophageal reflux disease)   . Asthma   . 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 Continuous tobacco abuse 05/02/2016  . 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 Status post tonsillectomy 05/02/2016  . Dehydration 10/12/2013  . Idiopathic thrombocytopenic purpura ([place]) 09/05/2012  . Lupus (systemic lupus  erythematosus) ([place]) 09/05/2012  . 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 HERNIA REPAIR    . stomach ulcer surgery  2008  . TONSILLECTOMY    . TOTAL HIP ARTHROPLASTY  06/14/2012   Procedure: TOTAL HIP ARTHROPLASTY ANTERIOR APPROACH;  Surgeon: Mcarthur Rossetti, MD;  Location: [facility];  Service: Orthopedics;  Laterality: Left;  Left Total Hip Arthroplasty, Anterior Approach C-Arm  . TUBAL LIGATION    . UMBILICAL HERNIA REPAIR       OB History   No obstetric history on file.      Home Medications    Prior to Admission medications   Medication Sig Start Date End Date Taking? Authorizing Provider  acetaminophen (TYLENOL) 325 MG tablet Take 2 tablets (650 mg total) by mouth every 6 (six) hours as needed for mild pain (or Fever >/= 101). 08/26/18   Roxan Hockey, MD  albuterol (PROVENTIL HFA;VENTOLIN HFA) 108 (90 Base) MCG/ACT inhaler Inhale 2 puffs into the lungs every 4 (four) hours as needed for wheezing or shortness of breath. Patient not taking: Reported on 08/18/2018 04/14/17   Orpah Greek, MD  budesonide-formoterol [facility]) 160-4.5 MCG/ACT inhaler Inhale 2 puffs into the lungs 2 (two) times daily. 11/12/17   Mannam, Hart Robinsons, MD  cefdinir (OMNICEF) 300 MG capsule Take 1 capsule (300 mg total) by mouth 2 (two) times daily for 7 days. 08/26/18 09/02/18  Roxan Hockey, MD  chlorpheniramine (CHLOR-TRIMETON) 4 MG tablet Take 2 tablets (8 mg total) by mouth 3 (three) times  daily. 12/25/17   Mannam, Hart Robinsons, MD  fluticasone (FLONASE) 50 MCG/ACT nasal spray Place 2 sprays into both nostrils daily. 11/12/17   Mannam, Hart Robinsons, MD  fluticasone furoate-vilanterol (BREO ELLIPTA) 200-25 MCG/INH AEPB Inhale 1 puff into the lungs daily. 06/10/18   Mannam, Hart Robinsons, MD  hydrocortisone cream 1 % Apply 1 application topically as needed for itching.    [provider]  lactobacillus (FLORANEX/LACTINEX) PACK Take 1 packet  (1 g total) by mouth 3 (three) times daily with meals. 08/26/18   Roxan Hockey, MD  metroNIDAZOLE (FLAGYL) 500 MG tablet Take 1 tablet (500 mg total) by mouth 3 (three) times daily for 7 days. 08/26/18 09/02/18  Roxan Hockey, MD  Nicotine 21-14-7 MG/24HR KIT Take as directed Patient not taking: Reported on 08/18/2018 03/05/18   Marshell Garfinkel, MD  ondansetron (ZOFRAN) 4 MG tablet Take 1 tablet (4 mg total) by mouth every 6 (six) hours as needed for nausea. 08/26/18   Roxan Hockey, MD  oxyCODONE-acetaminophen (PERCOCET/ROXICET) 5-325 MG tablet Take 1 tablet by mouth every 4 (four) hours as needed for severe pain. 08/26/18   Roxan Hockey, MD  pantoprazole (PROTONIX) 40 MG tablet Take 1 tablet (40 mg total) by mouth daily. 08/26/18   Roxan Hockey, MD  sucralfate (CARAFATE) 1 g tablet Take 1 tablet (1 g total) by mouth 4 (four) times daily. 02/01/18   Lacretia Leigh, MD    Family History Family History  Problem Relation Age of Onset  . Cancer Mother   . Hypertension Mother     Social History Social History   Tobacco Use  . Smoking status: Former Smoker    Packs/day: 2.50    Years: 29.00    Pack years: 72.50    Types: Cigarettes    Last attempt to quit: 05/08/2018    Years since quitting: 0.3  . Smokeless tobacco: Never Used  Substance Use Topics  . Alcohol use: Yes    Comment: 08/19/2018 "glass of wine a couple times/year"  . Drug use: No     Allergies   Allegra [fexofenadine]; Aspirin; Eszopiclone; and Restoril [temazepam]   Review of Systems Review of Systems  Constitutional: Positive for chills. Negative for diaphoresis, fatigue and fever.  HENT: Negative for congestion.   Eyes: Negative for visual disturbance.  Respiratory: Negative for cough, chest tightness, shortness of breath, wheezing and stridor.   Cardiovascular: Negative for chest pain, palpitations and leg swelling.  Gastrointestinal: Positive for abdominal pain, constipation and nausea. Negative for  diarrhea and vomiting.  Genitourinary: Negative for dysuria, flank pain, frequency and hematuria.  Musculoskeletal: Negative for back pain, neck pain and neck stiffness.  Skin: Negative for rash and wound.  Neurological: Positive for light-headedness. Negative for headaches.  Psychiatric/Behavioral: Negative for agitation.  All other systems reviewed and are negative.    Physical Exam Updated Vital Signs BP (!) 143/98 (BP Location: Left Arm)   Pulse 93   Temp 98.9 F (37.2 C) (Oral)   Resp 18   Ht '5\' 4"'  (1.626 m)   Wt 97 kg   LMP 02/03/2018   SpO2 99%   BMI 36.71 kg/m   Physical Exam Vitals signs and nursing note reviewed.  Constitutional:      General: She is not in acute distress.    Appearance: She is well-developed. She is not toxic-appearing or diaphoretic.  HENT:     Head: Normocephalic and atraumatic.     Right Ear: External ear normal.     Left Ear: External ear normal.  Nose: Nose normal. No congestion.     Mouth/Throat:     Pharynx: No oropharyngeal exudate.  Eyes:     Conjunctiva/sclera: Conjunctivae normal.     Pupils: Pupils are equal, round, and reactive to light.  Neck:     Musculoskeletal: Normal range of motion and neck supple.  Cardiovascular:     Rate and Rhythm: Normal rate.     Pulses: Normal pulses.     Heart sounds: Normal heart sounds. No murmur.  Pulmonary:     Effort: No respiratory distress.     Breath sounds: No stridor. No wheezing, rhonchi or rales.  Chest:     Chest wall: No tenderness.  Abdominal:     General: There is no distension.     Tenderness: There is abdominal tenderness in the periumbilical area and left lower quadrant. There is no right CVA tenderness, left CVA tenderness, guarding or rebound.     Hernia: No hernia is present.    Musculoskeletal:        General: No swelling or tenderness.  Skin:    General: Skin is warm.     Findings: No erythema or rash.  Neurological:     Mental Status: She is alert and  oriented to person, place, and time.     Motor: No abnormal muscle tone.     Coordination: Coordination normal.     Deep Tendon Reflexes: Reflexes are normal and symmetric.      ED Treatments / Results  Labs (all labs ordered are listed, but only abnormal results are displayed) Labs Reviewed  CBC WITH DIFFERENTIAL/PLATELET - Abnormal; Notable for the following components:      Result Value   Hemoglobin 11.7 (*)    MCH 24.6 (*)    MCHC 29.7 (*)    Platelets 716 (*)    All other components within normal limits  COMPREHENSIVE METABOLIC PANEL - Abnormal; Notable for the following components:   Glucose, Bld 108 (*)    Total Protein 5.9 (*)    Albumin 3.2 (*)    AST 12 (*)    Total Bilirubin 0.2 (*)    All other components within normal limits  LIPASE, BLOOD - Abnormal; Notable for the following components:   Lipase 65 (*)    All other components within normal limits  URINALYSIS, ROUTINE W REFLEX MICROSCOPIC - Abnormal; Notable for the following components:   Hgb urine dipstick SMALL (*)    Leukocytes, UA TRACE (*)    All other components within normal limits  URINE CULTURE  I-STAT CG4 LACTIC ACID, ED  I-STAT BETA HCG BLOOD, ED ([facility], [facility], [facility] ONLY)  I-STAT CG4 LACTIC ACID, ED    EKG None  Radiology Ct Abdomen Pelvis W Contrast  Result Date: 08/31/2018 CLINICAL DATA:  Left lower quadrant abdominal pain. Recent diverticulitis. EXAM: CT ABDOMEN AND PELVIS WITH CONTRAST TECHNIQUE: Multidetector CT imaging of the abdomen and pelvis was performed using the standard protocol following bolus administration of intravenous contrast. CONTRAST:  124m OMNIPAQUE IOHEXOL 300 MG/ML  SOLN COMPARISON:  08/21/2018 FINDINGS: Lower chest: Subsegmental atelectasis in both lower lobes. Calcified granuloma the posterior basal segment left lower lobe. Mild-to-moderate cardiomegaly. Small type 1 hiatal hernia. Hepatobiliary: Unremarkable Pancreas: Unremarkable Spleen: Unremarkable Adrenals/Urinary Tract:  Unremarkable Stomach/Bowel: Improved but not resolved diverticulitis. Significant reduction in the degree of mesenteric stranding and the intramural abscess is no longer a discrete finding. This tiny locule of gas along some of the inflammatory findings that remain on image 50/6, this could  be a tiny micro perforation or simply gas within a small elongated diverticulum adjacent to the appendix. The degree of secondary inflammation of the distal appendix is reduced, with the appendiceal diameter only mildly prominent at 8 mm (previously 12 mm). There is fat deposition in the wall of the ascending colon, frequently this is incidental although there is a weak association with inflammatory bowel disease. Air fluid levels in the distal colon indicating diarrheal process. Vascular/Lymphatic: Unremarkable Reproductive: Unremarkable Other: No supplemental non-categorized findings. Musculoskeletal: Right paraumbilical hernia containing adipose tissue, with stranding and edema signal both within the herniated adipose tissue and within the adjacent omentum, suspicious for possible chronic inflammation. Left total hip prosthesis without appreciable complicating feature of the visualized portion. Mild degenerative spurring the right hip. Incidental lipoma of the left external oblique muscle. IMPRESSION: 1. Improved but not resolved diverticulitis. Significant reduction in the degree of mesenteric stranding and the intramural abscess. There is a tiny locule of gas along some of the inflammatory findings which could be due to a tiny microperforation or simply gas within a small elongated diverticulum adjacent to the appendix. 2. The degree of secondary inflammation of the appendix is reduced, with appendiceal diameter reduced from prior 12 mm to current 8 mm. 3. Air- fluid levels in the distal colon indicating diarrheal process. 4. Small type 1 hiatal hernia. 5. Right paraumbilical hernia containing adipose tissue, with stranding  and edema signal both within the herniated adipose tissue and within the adjacent omentum, suspicious for chronic inflammation. 6. Other imaging findings of potential clinical significance: Mild-to-moderate cardiomegaly. Small type 1 hiatal hernia. Electronically Signed   By: Van Clines M.D.   On: 08/31/2018 22:40    Procedures Procedures (including critical care time)  Medications Ordered in ED Medications  oxyCODONE-acetaminophen (PERCOCET/ROXICET) 5-325 MG per tablet 1 tablet (has no administration in time range)  sodium chloride 0.9 % bolus 1,000 mL (1,000 mLs Intravenous New Bag/Given 08/31/18 2056)  morphine 4 MG/ML injection 4 mg (4 mg Intravenous Given 08/31/18 2057)  ondansetron (ZOFRAN) injection 4 mg (4 mg Intravenous Given 08/31/18 2056)  iohexol (OMNIPAQUE) 300 MG/ML solution 100 mL (100 mLs Intravenous Contrast Given 08/31/18 2202)     Initial Impression / Assessment and Plan / ED Course  I have reviewed the triage vital signs and the nursing notes.  Pertinent labs & imaging results that were available during my care of the patient were reviewed by me and considered in my medical decision making (see chart for details).     Dana Kennedy is a 52 y.o. female with a past medical history significant for asthma, lupus, ITP, and recent admission for acute diverticulitis with abdominal abscess who was discharged 4 days ago who presents with worsening left lower quadrant abdominal pain.  Patient reports that after discharge, she was doing well for 1 or 2 days but reports that she has had worsened left lower quadrant abdominal pain is a 10 out of 10.  She reports she is not having bowel movements.  She reports nausea but no vomiting.  She says she is a decreased oral intake.  She reports chills but no fevers at home.  She reports feeling fatigued and lightheaded.  She says that she is concerned something is wrong with her bowel.  She denies other trauma.  Denies any significant  back pain or flank pain.  No urinary symptoms or vaginal symptoms at this time.  She denies any chest pain, shortness of breath, congestion, or cough.  On  exam, left lower quadrant is tender to palpation.  Patient also had some tenderness in her umbilicus.  Patient had what felt to be a small hernia near her umbilicus which she reports is chronic.  Patient had no CVA tenderness.  Lungs were clear and chest was nontender.  Legs were nonedematous and nontender.  Clinically I am concerned the patient may have had worsened diverticulitis.  Patient may also have obstruction given her lack of bowel movement in 2 days.  She does report she is passing some gas.  She will be given pain medicine, nausea medicine, fluids, n.p.o., and will have labs and repeat CT imaging.  Anticipate reassessment after work-up.  10:37 PM Patient's laboratory testing showed negative lactic acid.  No leukocytosis present.  Urinalysis shows no infection.  Metabolic panel shows normal kidney function and liver function is not elevated.  Lipase is slightly more elevated than prior.  Spoke with radiology about CT scan.  Overall, they feel her diverticulitis is improving however they do report one area that has a small locule of gas that could be a microperforation.  They report the appendix swelling has improved and the area of abscess is also improved.  They do not see obstruction.  Given the area of possible microperforation in the setting of persistent diverticulitis, will reassess patient to determine disposition.  10:46 PM Patient was reassessed and she was feeling much better.  She reports her pain is now manageable.  We discussed the possibility of admission given the evidence of possible microperforation however given the overall improvement of findings, patient feels comfortable going home.  She understands that if symptoms change or worsen she needs to come back to the emergency department.  She has home nausea medicine and  will continue her oral antibiotics.  Patient understood return precautions and plan of care.  Patient was discharged in good condition.   Final Clinical Impressions(s) / ED Diagnoses   Final diagnoses:  Left lower quadrant abdominal pain  Diverticulitis    ED Discharge Orders         Ordered    oxyCODONE-acetaminophen (PERCOCET/ROXICET) 5-325 MG tablet  Every 4 hours PRN     08/31/18 2247         Clinical Impression: 1. Left lower quadrant abdominal pain   2. Diverticulitis     Disposition: Discharge  Condition: Good  I have discussed the results, Dx and Tx plan with the pt(& family if present). He/she/they expressed understanding and agree(s) with the plan. Discharge instructions discussed at great length. Strict return precautions discussed and pt &/or family have verbalized understanding of the instructions. No further questions at time of discharge.    New Prescriptions   OXYCODONE-ACETAMINOPHEN (PERCOCET/ROXICET) 5-325 MG TABLET    Take 1 tablet by mouth every 4 (four) hours as needed for severe pain.    Follow Up: [facility], [place] [id number] [phone]     [facility] [address] [id number] [facility]c [place]        Tegeler, Gwenyth Allegra, MD 08/31/18 2249

## 2018-09-01 LAB — URINE CULTURE: Culture: NO GROWTH

## 2018-09-06 NOTE — Progress Notes (Signed)
HEMATOLOGY/ONCOLOGY CONSULTATION NOTE  Date of Service: 09/09/2018  Patient Care Team: [facility] PCP - General (Family Medicine)  Dr. 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 --Treatment:steroids/ IVIG-->Rituximab. --Relapse, Dec 2013-Jan 2014: Likely triggered by a prolonged sinusitis problems with this had drop in platelets consistent with ITP flare. --Treatment: Prednisone in late Jan, tapered down and off by02/26/14. --Labs, 11/12/12:WBC 5.0, Hgb 11.6, Plt 276. --ED presentation/Relapse, 12/15/12:5-7 days of bruising and oral bleeding, preceded bynon-compliancewith Plaquenil by Dr Charlestine Night x3 months --Labs, 12/15/12: WBC 5.0, Hgb 12.2, Plt <5. --Treatment: Steroids --Labs, 12/18/12: WBC ..., Hgb ..., Plt 47. --Labs, 12/24/12:WBC 12.8,Hgb 11.6, Plt164;Steroid taper started --Labs, 12/31/12: WBC ..., Hgb ..., Plt 7(without overt bleeding). --Treatment: Rituximab QWk x4  --Labs, 10/31/17: WBC 8.2, Hgb 12.1, Plt 439 --Labs, 02/01/18: WBC   5.7, Hgb 13.0, Plt 375   HISTORY OF PRESENTING ILLNESS:   Dana Kennedy is a wonderful 52 y.o. female who was previously seen by my colleague Dr. Grace Isaac for evaluation and management of Dana Kennedy, who she last saw on 02/01/18. The pt reports that she is doing well overall.  Prior to the patient's visit today, she had a CT A/P on 08/31/18 which revealed Improved but not resolved diverticulitis. She is also anticipating a sacroiliac joint injection on 09/16/18 with Dr. Lupe Carney.  The pt reports that she is continuing to have abdominal pain. She notes that she is eating soup and broth without pain, but  anything more firm has not been tolerated well. She notes that she has completed her antibiotic course. She notes that she feels well until she eats firm foods like sandwiches.   The pt notes that her lupus has been bothersome for her and she has not seen a Rheumatologist in "a while."  Most recent lab results (09/09/18) of CBC w/diff is as follows: all values are WNL except for [facility] at 25.4.  On review of systems, pt reports recent infection, persisting abdominal tenderness, and denies fevers, chills, night sweats, and any other symptoms.   On PMHx the pt reports lupus. On Social Hx the pt reports that she has completely quit smoking recently.    MEDICAL HISTORY:  Past Medical History:  Diagnosis Date  . Anemia   . Asthma   . Chronic lower back pain   . GERD (gastroesophageal reflux disease)   . History of blood transfusion    "HgB low w/lupus flareup" (08/19/2018)  . History of ITP   . Rheumatoid arthritis ([place])    "legs; all my bones" (08/19/2018)  . SLE (systemic lupus erythematosus) ([place])     SURGICAL HISTORY: Past Surgical History:  Procedure Laterality Date  . HERNIA REPAIR    . stomach ulcer surgery  2008  . TONSILLECTOMY    . TOTAL HIP ARTHROPLASTY  06/14/2012   Procedure: TOTAL HIP ARTHROPLASTY ANTERIOR APPROACH;  Surgeon: Mcarthur Rossetti, MD;  Location: [facility];  Service: Orthopedics;  Laterality: Left;  Left Total Hip Arthroplasty, Anterior Approach C-Arm  . TUBAL LIGATION    . UMBILICAL HERNIA REPAIR      SOCIAL HISTORY: Social History   Socioeconomic History  . Marital status: Married    Spouse name: Not on file  . Number of children: Not on file  . Years of education: Not on file  . Highest education level: Not on file  Occupational History  . Not on file  Social Needs  . Financial resource strain: Not on file  . Food insecurity:    Worry: Not on file    Inability: Not on file  . Transportation needs:    Medical: Not on file    Non-medical:  Not on file  Tobacco Use  . Smoking status: Former Smoker    Packs/day: 2.50    Years: 29.00    Pack years: 72.50    Types: Cigarettes    Last attempt to quit: 05/08/2018    Years since quitting: 0.3  . Smokeless tobacco: Never Used  Substance and Sexual Activity  . Alcohol use: Yes    Comment: 08/19/2018 "glass of wine a couple times/year"  . Drug use: No  . Sexual activity: Yes  Lifestyle  . Physical activity:    Days per week: Not on file    Minutes per session: Not on file  . Stress: Not on file  Relationships  . Social connections:    Talks on phone: Not on file    Gets together: Not on file    Attends religious service: Not on file    Active member of club or organization: Not on file    Attends meetings of clubs or organizations: Not on file    Relationship status: Not on file  . Intimate partner violence:    Fear of current or ex partner: Not on file    Emotionally abused: Not on file    Physically abused: Not on file    Forced sexual activity: Not on file  Other Topics Concern  . Not on file  Social History Narrative  . Not on file    FAMILY HISTORY: Family History  Problem Relation Age of Onset  . Cancer Mother   . Hypertension Mother     ALLERGIES:  is allergic to allegra [fexofenadine]; aspirin; eszopiclone; and restoril [temazepam].  MEDICATIONS:  Current Outpatient Medications  Medication Sig Dispense Refill  . acetaminophen (TYLENOL) 325 MG tablet Take 2 tablets (650 mg total) by mouth every 6 (six) hours as needed for mild pain (or Fever >/= 101). 12 tablet 0  . budesonide-formoterol (SYMBICORT) 160-4.5 MCG/ACT inhaler Inhale 2 puffs into the lungs 2 (two) times daily. 1 Inhaler 6  . chlorpheniramine (CHLOR-TRIMETON) 4 MG tablet Take 2 tablets (8 mg total) by mouth 3 (three) times daily. 190 tablet 0  . fluticasone (FLONASE) 50 MCG/ACT nasal spray Place 2 sprays into both nostrils daily. 16 g 2  . fluticasone furoate-vilanterol (BREO ELLIPTA)  200-25 MCG/INH AEPB Inhale 1 puff into the lungs daily. 60 each 5  . lactobacillus (FLORANEX/LACTINEX) PACK Take 1 packet (1 g total) by mouth 3 (three) times daily with meals. 30 packet 0  . ondansetron (ZOFRAN) 4 MG tablet Take 1 tablet (4 mg total) by mouth every 6 (six) hours as needed for nausea. 12 tablet 0  . pantoprazole (PROTONIX) 40 MG tablet Take 1 tablet (40 mg total) by mouth daily. 30 tablet 1  . sucralfate (CARAFATE) 1 g tablet Take 1 tablet (1 g total) by mouth 4 (four) times daily. 30 tablet 0  . albuterol (PROVENTIL HFA;VENTOLIN HFA) 108 (90 Base) MCG/ACT inhaler Inhale 2 puffs into the lungs every 4 (four) hours as needed for wheezing or shortness of breath. (Patient not taking: Reported on 08/18/2018) 1 Inhaler 2  . Nicotine 21-14-7 MG/24HR KIT Take as directed (Patient not taking: Reported on 08/18/2018) 56 each 0  . oxyCODONE-acetaminophen (PERCOCET/ROXICET)  5-325 MG tablet Take 1 tablet by mouth every 4 (four) hours as needed for severe pain. (Patient not taking: Reported on 09/09/2018) 10 tablet 0  . oxyCODONE-acetaminophen (PERCOCET/ROXICET) 5-325 MG tablet Take 1 tablet by mouth every 4 (four) hours as needed for severe pain. (Patient not taking: Reported on 09/09/2018) 15 tablet 0   No current facility-administered medications for this visit.     REVIEW OF SYSTEMS:    10 Point review of Systems was done is negative except as noted above.  PHYSICAL EXAMINATION:  . Vitals:   09/09/18 0848  BP: 112/87  Pulse: 99  Resp: 18  Temp: 98.6 F (37 C)  SpO2: 100%   Filed Weights   09/09/18 0848  Weight: 208 lb (94.3 kg)   .Body mass index is 35.7 kg/m.  GENERAL:alert, in no acute distress and comfortable SKIN: no acute rashes, no significant lesions EYES: conjunctiva are pink and non-injected, sclera anicteric OROPHARYNX: MMM, no exudates, no oropharyngeal erythema or ulceration NECK: supple, no JVD LYMPH:  no palpable lymphadenopathy in the cervical, axillary  or inguinal regions LUNGS: clear to auscultation b/l with normal respiratory effort HEART: regular rate & rhythm ABDOMEN:  normoactive bowel sounds , non tender, not distended. Extremity: no pedal edema PSYCH: alert & oriented x 3 with fluent speech NEURO: no focal motor/sensory deficits  LABORATORY DATA:  I have reviewed the data as listed  . CBC Latest Ref Rng & Units 09/09/2018 08/31/2018 08/25/2018  WBC 4.0 - 10.5 K/uL 6.9 6.8 7.3  Hemoglobin 12.0 - 15.0 g/dL 12.6 11.7(L) 11.0(L)  Hematocrit 36.0 - 46.0 % 40.5 39.4 35.5(L)  Platelets 150 - 400 K/uL 396 716(H) 593(H)    . CMP Latest Ref Rng & Units 08/31/2018 08/25/2018 08/23/2018  Glucose 70 - 99 mg/dL 108(H) 100(H) 117(H)  BUN 6 - 20 mg/dL 12 6 <5(L)  Creatinine 0.44 - 1.00 mg/dL 0.68 0.73 0.78  Sodium 135 - 145 mmol/L 144 138 138  Potassium 3.5 - 5.1 mmol/L 3.9 3.6 3.5  Chloride 98 - 111 mmol/L 110 103 104  CO2 22 - 32 mmol/L _0 Calcium 8.9 - 10.3 mg/dL 9.0 8.5(L) 8.7(L)  Total Protein 6.5 - 8.1 g/dL 5.9(L) - -  Total Bilirubin 0.3 - 1.2 mg/dL 0.2(L) - -  Alkaline Phos 38 - 126 U/L 69 - -  AST 15 - 41 U/L 12(L) - -  ALT 0 - 44 U/L 13 - -     RADIOGRAPHIC STUDIES: I have personally reviewed the radiological images as listed and agreed with the findings in the report. Ct Abdomen Pelvis W Contrast  Result Date: 08/31/2018 CLINICAL DATA:  Left lower quadrant abdominal pain. Recent diverticulitis. EXAM: CT ABDOMEN AND PELVIS WITH CONTRAST TECHNIQUE: Multidetector CT imaging of the abdomen and pelvis was performed using the standard protocol following bolus administration of intravenous contrast. CONTRAST:  152m OMNIPAQUE IOHEXOL 300 MG/ML  SOLN COMPARISON:  08/21/2018 FINDINGS: Lower chest: Subsegmental atelectasis in both lower lobes. Calcified granuloma the posterior basal segment left lower lobe. Mild-to-moderate cardiomegaly. Small type 1 hiatal hernia. Hepatobiliary: Unremarkable Pancreas: Unremarkable Spleen:  Unremarkable Adrenals/Urinary Tract: Unremarkable Stomach/Bowel: Improved but not resolved diverticulitis. Significant reduction in the degree of mesenteric stranding and the intramural abscess is no longer a discrete finding. This tiny locule of gas along some of the inflammatory findings that remain on image 50/6, this could be a tiny micro perforation or simply gas within a small elongated diverticulum adjacent to the appendix. The degree of secondary  inflammation of the distal appendix is reduced, with the appendiceal diameter only mildly prominent at 8 mm (previously 12 mm). There is fat deposition in the wall of the ascending colon, frequently this is incidental although there is a weak association with inflammatory bowel disease. Air fluid levels in the distal colon indicating diarrheal process. Vascular/Lymphatic: Unremarkable Reproductive: Unremarkable Other: No supplemental non-categorized findings. Musculoskeletal: Right paraumbilical hernia containing adipose tissue, with stranding and edema signal both within the herniated adipose tissue and within the adjacent omentum, suspicious for possible chronic inflammation. Left total hip prosthesis without appreciable complicating feature of the visualized portion. Mild degenerative spurring the right hip. Incidental lipoma of the left external oblique muscle. IMPRESSION: 1. Improved but not resolved diverticulitis. Significant reduction in the degree of mesenteric stranding and the intramural abscess. There is a tiny locule of gas along some of the inflammatory findings which could be due to a tiny microperforation or simply gas within a small elongated diverticulum adjacent to the appendix. 2. The degree of secondary inflammation of the appendix is reduced, with appendiceal diameter reduced from prior 12 mm to current 8 mm. 3. Air- fluid levels in the distal colon indicating diarrheal process. 4. Small type 1 hiatal hernia. 5. Right paraumbilical hernia  containing adipose tissue, with stranding and edema signal both within the herniated adipose tissue and within the adjacent omentum, suspicious for chronic inflammation. 6. Other imaging findings of potential clinical significance: Mild-to-moderate cardiomegaly. Small type 1 hiatal hernia. Electronically Signed   By: Van Clines M.D.   On: 08/31/2018 22:40   Ct Abdomen Pelvis W Contrast  Result Date: 08/21/2018 CLINICAL DATA:  Abdominal pain suspected diverticulitis. EXAM: CT ABDOMEN AND PELVIS WITH CONTRAST TECHNIQUE: Multidetector CT imaging of the abdomen and pelvis was performed using the standard protocol following bolus administration of intravenous contrast. CONTRAST:  152m OMNIPAQUE IOHEXOL 300 MG/ML  SOLN COMPARISON:  Body CT 08/16/2018 and 05/06/2018 FINDINGS: Lower chest: Hyperventilatory changes in the right lung base. Hepatobiliary: No focal liver abnormality is seen. No gallstones, gallbladder wall thickening, or biliary dilatation. Pancreas: Unremarkable. No pancreatic ductal dilatation or surrounding inflammatory changes. Spleen: Normal in size without focal abnormality. Adrenals/Urinary Tract: Adrenal glands are unremarkable. Kidneys are normal, without renal calculi, focal lesion, or hydronephrosis. Bladder is unremarkable. Stomach/Bowel: Normal appearance of the stomach and small bowel. Scattered colonic diverticulosis. The area of presumed diverticulitis in the sigmoid colon demonstrates interval worsening inflammatory changes. There is a relatively long segment of circumferential marked mucosal thickening of the involved sigmoid colon with an intramural area of hypoattenuation which measures 2.7 x 2.1 x 1.9 cm, likely representing an abscess. The appendix is long and crosses the midline with extension to the left lower quadrant. The distal part of the appendix, which abuts the abnormal sigmoid colon, is thickened to 11 mm, fluid-filled and with indistinct borders, consistent with tip  appendicitis, likely secondary to the inflammatory process of the sigmoid colon. There is marked pericolonic inflammatory stranding. Interval development of adjacent mesenteric lymphadenopathy. Small amount of free fluid tracks into the left adnexa. Vascular/Lymphatic: Likely reactive central mesenteric lymphadenopathy. Reproductive: Normal appearance of the uterus and the right adnexa. Inflammatory fluid within the left adnexa. Other: Known periumbilical fat and fluid containing anterior abdominal wall hernia. Musculoskeletal: Post left hip arthroplasty, stable. IMPRESSION: Worsening of the presumed sigmoid colon diverticulitis with marked thickening of the involved sigmoid colon, pericolonic inflammatory changes, 2.7 cm intramural abscess, and secondary tip appendicitis. Small amount of inflammatory fluid tracks into the left adnexa.  Likely reactive central mesenteric lymphadenopathy. These results were called by telephone at the time of interpretation on 08/21/2018 at 4:54 pm to Dr. Roxan Hockey , who verbally acknowledged these results. Electronically Signed   By: Fidela Salisbury M.D.   On: 08/21/2018 17:13   Ct Abdomen Pelvis W Contrast  Result Date: 08/16/2018 CLINICAL DATA:  Lower abdominal pain and no midline hernia. Some low back pain as hernia repair 2014 but states hernia has recurred. Pain worse over the past 3 days. EXAM: CT ABDOMEN AND PELVIS WITH CONTRAST TECHNIQUE: Multidetector CT imaging of the abdomen and pelvis was performed using the standard protocol following bolus administration of intravenous contrast. CONTRAST:  142m OMNIPAQUE IOHEXOL 300 MG/ML  SOLN COMPARISON:  05/06/2018 and 02/01/2018 FINDINGS: Lower chest: Calcified granuloma over the left lower lobe. Mild cardiomegaly. Hepatobiliary: Gallbladder, liver and biliary tree are normal. Pancreas: Normal. Spleen: Normal. Adrenals/Urinary Tract: Adrenal glands are normal. Kidneys are normal in size without hydronephrosis or  nephrolithiasis. Ureters and bladder are normal. Stomach/Bowel: Stomach and small bowel are normal. Appendix is within normal. Diverticulosis of the colon. There is inflammatory change adjacent a short segment of the thick-walled sigmoid colon in the left lower quadrant compatible with acute diverticulitis. There is no perforation or abscess. Vascular/Lymphatic: Normal. Reproductive: Within normal. Other: Periumbilical hernia containing peritoneal fat unchanged. Tiny amount of free pelvic fluid. Musculoskeletal: Left hip arthroplasty intact. Mild degenerate change of the right hip. IMPRESSION: Colonic diverticulosis with mild acute diverticulitis involving a short segment of the sigmoid colon in the left lower quadrant. No perforation or abscess. Known umbilical hernia containing peritoneal fat unchanged. Electronically Signed   By: DMarin OlpM.D.   On: 08/16/2018 20:11   Dg Abd Acute W/chest  Result Date: 08/18/2018 CLINICAL DATA:  Abdominal pain and vomiting EXAM: DG ABDOMEN ACUTE W/ 1V CHEST COMPARISON:  12/25/2017 chest radiograph. 05/06/2017 abdominal radiograph FINDINGS: Stable cardiomediastinal silhouette with cardiomegaly. No pneumothorax. No pleural effusion. Mild bibasilar atelectasis. No pulmonary edema. No consolidative airspace disease. No dilated small bowel loops or significant air-fluid levels. Mild colonic stool. No evidence of pneumatosis or pneumoperitoneum. No radiopaque nephrolithiasis. Left total hip arthroplasty. Lower lumbar spondylosis. IMPRESSION: 1. Stable mild cardiomegaly without pulmonary edema. 2. Mild bibasilar atelectasis. 3. Nonobstructive bowel gas pattern. Electronically Signed   By: JIlona SorrelM.D.   On: 08/18/2018 11:54    ASSESSMENT & PLAN:  52y.o. female with systemic lupus and   1. Dana Kennedy -Discussed patient's most recent labs from 09/09/18, PLT normal at 396k, HGB normal at 12.6, WBC normal at 6.9k -No intervention required at this time for the  patient's Dana Kennedy -Recommend PCP Dr. WMina Marbleconsider Rheumatology referral as treatment for the patient's Lupus will also have a bearing on her Dana Kennedy, as Lupus has been the primary driver of her ITP -Advised keeping well hydrated, and continuing to eat soups until firmer foods are tolerated. Also advised following up with PCP, which the pt notes she will do on 09/16/18.  -Advised that if the pt develops any black stools or blood in the stools, or new worsened abdominal pain, she should present to the ED  -Will see the pt back in 6 months    RTC with Dr KIrene Limbowith labs in 6 months   All of the patients questions were answered with apparent satisfaction. The patient knows to call the clinic with any problems, questions or concerns.  The total time spent in the appt was 25 minutes and more than  50% was on counseling and direct patient cares.    Sullivan Lone MD MS AAHIVMS [facility] Hematology/Oncology Physician [facility]  (Office):       [phone] (Work cell):  [phone] (Fax):           [phone]  09/09/2018 9:32 AM  I, Baldwin [place], am acting as a scribe for Dr. Sullivan Lone.   .I have reviewed the above documentation for accuracy and completeness, and I agree with the above. Brunetta Genera MD

## 2018-09-09 ENCOUNTER — Inpatient Hospital Stay: Payer: Medicaid Other | Attending: Hematology

## 2018-09-09 ENCOUNTER — Inpatient Hospital Stay (HOSPITAL_BASED_OUTPATIENT_CLINIC_OR_DEPARTMENT_OTHER): Payer: Medicaid Other | Admitting: Hematology

## 2018-09-09 ENCOUNTER — Telehealth: Payer: Self-pay

## 2018-09-09 ENCOUNTER — Encounter: Payer: Self-pay | Admitting: Hematology

## 2018-09-09 VITALS — BP 112/87 | HR 99 | Temp 98.6°F | Resp 18 | Ht 64.0 in | Wt 208.0 lb

## 2018-09-09 DIAGNOSIS — Z79899 Other long term (current) drug therapy: Secondary | ICD-10-CM | POA: Insufficient documentation

## 2018-09-09 DIAGNOSIS — M329 Systemic lupus erythematosus, unspecified: Secondary | ICD-10-CM

## 2018-09-09 DIAGNOSIS — D6941 Evans syndrome: Secondary | ICD-10-CM | POA: Insufficient documentation

## 2018-09-09 DIAGNOSIS — Z87891 Personal history of nicotine dependence: Secondary | ICD-10-CM | POA: Insufficient documentation

## 2018-09-09 DIAGNOSIS — D693 Immune thrombocytopenic purpura: Secondary | ICD-10-CM

## 2018-09-09 LAB — CMP (CANCER CENTER ONLY)
ALT: 11 U/L (ref 0–44)
AST: 11 U/L — ABNORMAL LOW (ref 15–41)
Albumin: 3.7 g/dL (ref 3.5–5.0)
Alkaline Phosphatase: 91 U/L (ref 38–126)
Anion gap: 11 (ref 5–15)
BUN: 13 mg/dL (ref 6–20)
CHLORIDE: 108 mmol/L (ref 98–111)
CO2: 27 mmol/L (ref 22–32)
Calcium: 9.6 mg/dL (ref 8.9–10.3)
Creatinine: 0.84 mg/dL (ref 0.44–1.00)
GFR, Est AFR Am: >60 mL/min (ref >60)
GFR, Estimated: >60 mL/min (ref >60)
GLUCOSE: 122 mg/dL — AB (ref 70–99)
Potassium: 3.9 mmol/L (ref 3.5–5.1)
SODIUM: 146 mmol/L — AB (ref 135–145)
Total Bilirubin: <0.2 mg/dL — ABNORMAL LOW (ref 0.3–1.2)
Total Protein: 6.9 g/dL (ref 6.5–8.1)

## 2018-09-09 LAB — CBC WITH DIFFERENTIAL (CANCER CENTER ONLY)
Abs Immature Granulocytes: 0.02 10*3/uL (ref 0.00–0.07)
Basophils Absolute: 0.1 10*3/uL (ref 0.0–0.1)
Basophils Relative: 1 %
Eosinophils Absolute: 0.2 10*3/uL (ref 0.0–0.5)
Eosinophils Relative: 3 %
HCT: 40.5 % (ref 36.0–46.0)
Hemoglobin: 12.6 g/dL (ref 12.0–15.0)
Immature Granulocytes: 0 %
LYMPHS PCT: 33 %
Lymphs Abs: 2.3 10*3/uL (ref 0.7–4.0)
MCH: 25.4 pg — ABNORMAL LOW (ref 26.0–34.0)
MCHC: 31.1 g/dL (ref 30.0–36.0)
MCV: 81.5 fL (ref 80.0–100.0)
Monocytes Absolute: 0.6 10*3/uL (ref 0.1–1.0)
Monocytes Relative: 8 %
Neutro Abs: 3.7 10*3/uL (ref 1.7–7.7)
Neutrophils Relative %: 55 %
Platelet Count: 396 10*3/uL (ref 150–400)
RBC: 4.97 MIL/uL (ref 3.87–5.11)
RDW: 14.8 % (ref 11.5–15.5)
WBC Count: 6.9 10*3/uL (ref 4.0–10.5)
nRBC: 0 % (ref 0.0–0.2)

## 2018-09-09 NOTE — Telephone Encounter (Signed)
Printed avs and calender of upcoming appointment. Per 12/23 los 

## 2018-09-25 ENCOUNTER — Ambulatory Visit: Payer: Self-pay | Admitting: General Surgery

## 2018-09-26 NOTE — Patient Instructions (Addendum)
Dana Kennedy  09/26/2018   Your procedure is scheduled on: 10-08-2018  Report to [facility]  Entrance  Report to admitting at 845 AM    Call this number if you have problems the morning of surgery [phone]    Remember:  [place], NO [place]. FOLLOW ALL BOWEL PREP INSTRUCTIONS FROM DR Kieth Brightly  DRINK 2 PRESURGERY ENSURE DRINKS THE NIGHT BEFORE SURGERY AT  1000 PM AND 1 PRESURGERY DRINK THE DAY OF THE PROCEDURE 3 HOURS PRIOR TO SCHEDULED SURGERY. NO SOLIDS AFTER MIDNIGHT THE DAY PRIOR TO THE SURGERY. NOTHING BY MOUTH EXCEPT CLEAR LIQUIDS UNTIL THREE HOURS PRIOR TO SCHEDULED SURGERY. PLEASE FINISH PRESURGERY ENSURE DRINK PER SURGEON ORDER 3 HOURS PRIOR TO SCHEDULED SURGERY TIME WHICH NEEDS TO BE COMPLETED AT 745 AM  FOLLOW ALL BOWEL PREP INSTRUCTIONS FROM DR Kieth Brightly  CLEAR LIQUID DIET   Foods Allowed                                                                     Foods Excluded  Coffee and tea, regular and decaf                             liquids that you cannot  Plain Jell-O in any flavor                                             see through such as: Fruit ices (not with fruit pulp)                                     milk, soups, orange juice  Iced Popsicles                                    All solid food Carbonated beverages, regular and diet                                    Cranberry, grape and apple juices Sports drinks like Gatorade Lightly seasoned clear broth or consume(fat free) Sugar, honey syrup  Sample Menu Breakfast                                Lunch                                     Supper Cranberry juice                    Beef broth  Chicken broth Jell-O                                     Grape juice                           Apple juice Coffee or tea                        Jell-O                                       Popsicle                                                Coffee or tea                        Coffee or tea  _____________________________________________________________________    Take these medicines the morning of surgery with A SIP OF WATER: SYMBICORT, FLONASE NASAL SPRAY, PANTAPRAZOLE, PERCOCET IF NEEDED, ZOFRAN IF NEEDED, NICOTINE PATCH, ALBUTEROL INHALER IF NEEDED AND BRING INHALER                               You may not have any metal on your body including hair pins and              piercings  Do not wear jewelry, make-up, lotions, powders or perfumes, deodorant             Do not wear nail polish.  Do not shave  48 hours prior to surgery.              Men may shave face and neck.   Do not bring valuables to the hospital. [place].  Contacts, dentures or bridgework may not be worn into surgery.  Leave suitcase in the car. After surgery it may be brought to your room.                  Please read over the following fact sheets you were given: _____________________________________________________________________  [facility] - Preparing for Surgery Before surgery, you can play an important role.  Because skin is not sterile, your skin needs to be as free of germs as possible.  You can reduce the number of germs on your skin by washing with CHG (chlorahexidine gluconate) soap before surgery.  CHG is an antiseptic cleaner which kills germs and bonds with the skin to continue killing germs even after washing. Please DO NOT use if you have an allergy to CHG or antibacterial soaps.  If your skin becomes reddened/irritated stop using the CHG and inform your nurse when you arrive at Short Stay. Do not shave (including legs and underarms) for at least 48 hours prior to the first CHG shower.  You may shave your face/neck. Please follow these instructions carefully:  1.  Shower with CHG Soap the night before surgery and the  morning of  Surgery.  2.  If you choose to wash your hair, wash your hair first as usual with your  normal  shampoo.  3.  After you shampoo, rinse your hair and body thoroughly to remove the  shampoo.                           4.  Use CHG as you would any other liquid soap.  You can apply chg directly  to the skin and wash                       Gently with a scrungie or clean washcloth.  5.  Apply the CHG Soap to your body ONLY FROM THE NECK DOWN.   Do not use on face/ open                           Wound or open sores. Avoid contact with eyes, ears mouth and genitals (private parts).                       Wash face,  Genitals (private parts) with your normal soap.             6.  Wash thoroughly, paying special attention to the area where your surgery  will be performed.  7.  Thoroughly rinse your body with warm water from the neck down.  8.  DO NOT shower/wash with your normal soap after using and rinsing off  the CHG Soap.                9.  Pat yourself dry with a clean towel.            10.  Wear clean pajamas.            11.  Place clean sheets on your bed the night of your first shower and do not  sleep with pets. Day of Surgery : Do not apply any lotions/deodorants the morning of surgery.  Please wear clean clothes to the hospital/surgery center.  FAILURE TO FOLLOW THESE INSTRUCTIONS MAY RESULT IN THE CANCELLATION OF YOUR SURGERY PATIENT SIGNATURE_________________________________  NURSE SIGNATURE__________________________________  ________________________________________________________________________   Dana Kennedy  An incentive spirometer is a tool that can help keep your lungs clear and active. This tool measures how well you are filling your lungs with each breath. Taking long deep breaths may help reverse or decrease the chance of developing breathing (pulmonary) problems (especially infection) following:  A long period of time when you are unable to move or be active. BEFORE  THE PROCEDURE   If the spirometer includes an indicator to show your best effort, your nurse or respiratory therapist will set it to a desired goal.  If possible, sit up straight or lean slightly forward. Try not to slouch.  Hold the incentive spirometer in an upright position. INSTRUCTIONS FOR USE  1. Sit on the edge of your bed if possible, or sit up as far as you can in bed or on a chair. 2. Hold the incentive spirometer in an upright position. 3. Breathe out normally. 4. Place the mouthpiece in your mouth and seal your lips tightly around it. 5. Breathe in slowly and as deeply as possible, raising the piston or the ball toward the top of the column. 6. Hold your breath for 3-5 seconds or for as long as possible. Allow the piston or  ball to fall to the bottom of the column. 7. Remove the mouthpiece from your mouth and breathe out normally. 8. Rest for a few seconds and repeat Steps 1 through 7 at least 10 times every 1-2 hours when you are awake. Take your time and take a few normal breaths between deep breaths. 9. The spirometer may include an indicator to show your best effort. Use the indicator as a goal to work toward during each repetition. 10. After each set of 10 deep breaths, practice coughing to be sure your lungs are clear. If you have an incision (the cut made at the time of surgery), support your incision when coughing by placing a pillow or rolled up towels firmly against it. Once you are able to get out of bed, walk around indoors and cough well. You may stop using the incentive spirometer when instructed by your caregiver.  RISKS AND COMPLICATIONS  Take your time so you do not get dizzy or light-headed.  If you are in pain, you may need to take or ask for pain medication before doing incentive spirometry. It is harder to take a deep breath if you are having pain. AFTER USE  Rest and breathe slowly and easily.  It can be helpful to keep track of a log of your progress.  Your caregiver can provide you with a simple table to help with this. If you are using the spirometer at home, follow these instructions: [place] IF:   You are having difficultly using the spirometer.  You have trouble using the spirometer as often as instructed.  Your pain medication is not giving enough relief while using the spirometer.  You develop fever of 100.5 F (38.1 C) or higher. SEEK IMMEDIATE MEDICAL CARE IF:   You cough up bloody sputum that had not been present before.  You develop fever of 102 F (38.9 C) or greater.  You develop worsening pain at or near the incision site. MAKE SURE YOU:   Understand these instructions.  Will watch your condition.  Will get help right away if you are not doing well or get worse. Document Released: 01/15/2007 Document Revised: 11/27/2011 Document Reviewed: 03/18/2007 [facility] Patient Information 2014 [place], [place].   ________________________________________________________________________

## 2018-09-26 NOTE — Progress Notes (Signed)
CHEST XRAY 12-25-17 Epic EKG 10-31-17 EPIC

## 2018-10-02 ENCOUNTER — Encounter (HOSPITAL_COMMUNITY)
Admission: RE | Admit: 2018-10-02 | Discharge: 2018-10-02 | Disposition: A | Discharge status: Home / Self Care | Payer: Medicaid Other | Source: Ambulatory Visit | Attending: General Surgery | Admitting: General Surgery

## 2018-10-02 ENCOUNTER — Encounter (HOSPITAL_COMMUNITY): Payer: Self-pay

## 2018-10-02 ENCOUNTER — Other Ambulatory Visit: Payer: Self-pay

## 2018-10-02 DIAGNOSIS — Z01818 Encounter for other preprocedural examination: Secondary | ICD-10-CM | POA: Insufficient documentation

## 2018-10-02 DIAGNOSIS — R079 Chest pain, unspecified: Secondary | ICD-10-CM | POA: Diagnosis not present

## 2018-10-02 HISTORY — DX: Diverticulitis of intestine, part unspecified, without perforation or abscess without bleeding: K57.92

## 2018-10-02 HISTORY — DX: Umbilical hernia without obstruction or gangrene: K42.9

## 2018-10-02 HISTORY — DX: Chest pain, unspecified: R07.9

## 2018-10-02 LAB — BASIC METABOLIC PANEL
Anion gap: 8 (ref 5–15)
BUN: 11 mg/dL (ref 6–20)
CO2: 26 mmol/L (ref 22–32)
Calcium: 8.9 mg/dL (ref 8.9–10.3)
Chloride: 108 mmol/L (ref 98–111)
Creatinine, Ser: 0.8 mg/dL (ref 0.44–1.00)
Glucose, Bld: 107 mg/dL — ABNORMAL HIGH (ref 70–99)
Potassium: 3.8 mmol/L (ref 3.5–5.1)
SODIUM: 142 mmol/L (ref 135–145)

## 2018-10-02 LAB — CBC WITH DIFFERENTIAL/PLATELET
Abs Immature Granulocytes: 0.03 10*3/uL (ref 0.00–0.07)
Basophils Absolute: 0 10*3/uL (ref 0.0–0.1)
Basophils Relative: 0 %
Eosinophils Absolute: 0.1 10*3/uL (ref 0.0–0.5)
Eosinophils Relative: 2 %
HCT: 40.3 % (ref 36.0–46.0)
Hemoglobin: 12.3 g/dL (ref 12.0–15.0)
Immature Granulocytes: 0 %
Lymphocytes Relative: 35 %
Lymphs Abs: 2.3 10*3/uL (ref 0.7–4.0)
MCH: 25.5 pg — ABNORMAL LOW (ref 26.0–34.0)
MCHC: 30.5 g/dL (ref 30.0–36.0)
MCV: 83.4 fL (ref 80.0–100.0)
Monocytes Absolute: 0.6 10*3/uL (ref 0.1–1.0)
Monocytes Relative: 9 %
NRBC: 0 % (ref 0.0–0.2)
Neutro Abs: 3.6 10*3/uL (ref 1.7–7.7)
Neutrophils Relative %: 54 %
Platelets: 404 10*3/uL — ABNORMAL HIGH (ref 150–400)
RBC: 4.83 MIL/uL (ref 3.87–5.11)
RDW: 15.9 % — ABNORMAL HIGH (ref 11.5–15.5)
WBC: 6.7 10*3/uL (ref 4.0–10.5)

## 2018-10-02 LAB — HEMOGLOBIN A1C
HEMOGLOBIN A1C: 6.4 % — AB (ref 4.8–5.6)
Mean Plasma Glucose: 136.98 mg/dL

## 2018-10-02 NOTE — Consult Note (Signed)
[place] Nurse requested for preoperative stoma site marking.  Having abdominal hernia repair. Marking for ostomy in bilateral upper quadrants.  Patient has round, distended abdomen with large bulging from hernia noted in RLQ .  Discussed surgical procedure and stoma creation with patient and family.  Explained role of the [place] nurse team.  Provided the patient with educational booklet and provided samples of pouching options.  Answered patient and family questions.   Examined patient  sitting, and standing in order to place the marking in the patient's visual field, away from any creases or abdominal contour issues and within the rectus muscle. Wears belt at umbilicus and due to round abdomen will mark above umbilicus.   Marked for colostomy in the LLQ  4 cm to the left of the umbilicus and 4 cm above the umbilicus.  Marked for ileostomy in the RLQ  4 cm to the right of the umbilicus and  4  cm above the umbilicus.  Patient's abdomen cleansed with CHG wipes at site markings, allowed to air dry prior to marking.Covered mark with thin film transparent dressing to preserve mark until date of surgery.   [place] Nurse team will follow up with patient after surgery for continue ostomy care and teaching.   Domenic Moras MSN, RN, FNP-BC CWON Wound, Ostomy, Continence Nurse Pager [phone]

## 2018-10-03 ENCOUNTER — Encounter (HOSPITAL_COMMUNITY): Payer: Self-pay | Admitting: Physician Assistant

## 2018-10-04 NOTE — Progress Notes (Signed)
PCP: no pcp per patient, goes to [facility] when needed  CARDIOLOGIST: dr Philbert Riser  INFO IN Epic: ekg 10-02-18  INFO ON CHART: requested cardiology records from [place] 10-02-18 and 10-03-18, never received, patient stated was going for cardiac clearance for hernia repair, then surgery changed to partial colectomy.  BLOOD THINNERS AND LAST DOSES:none ____________________________________  PATIENT SYMPTOMS AT TIME OF PREOP: none

## 2018-10-04 NOTE — Progress Notes (Signed)
EKG 07-11-18 [facility] RECEIVED AND PLACED ON CHART

## 2018-10-07 MED ORDER — BUPIVACAINE LIPOSOME 1.3 % IJ SUSP
20.0000 mL | Freq: Once | INTRAMUSCULAR | Status: DC
  Filled 2018-10-07: qty 20

## 2018-10-07 NOTE — Progress Notes (Addendum)
Anesthesia Chart Review   Case:  [id number] Date/Time:  10/08/18 1030   Procedure:  PARTIAL COLON RESECTION WITH ANOSTOMOSIS, POSSIBLE OSTOMY ERAS PATHWAY (N/A )   Anesthesia type:  General   Pre-op diagnosis:  diverticalitis   Location:  [place] 02 / [facility] ORS   Surgeon:  Mickeal Skinner, MD      DISCUSSION: 53 yo former smoker (quit 09/01/18) with h/o anemia, asthma, GERD, SLE, rheumatoid arthritis, diverticulitis, h/o ITP scheduled for above surgery on 10/08/18 with Dr. Gurney Maxin.   Last seen by hematology 02/03/18 with stable platelet count and return follow up in 6 months.  Platelet count at [facility] on 10/02/18 404.  She has previously been seen by rheumatology, this was about 5 years ago.  Currently SLE asymptomatic.   Pt seen by cardiology, Dr. Alphia Moh, on 07/10/18 for preoperative clearance.  Note in Care Everywhere.  At this visit patient complained of atypical chest pain.  Per Dr. Philbert Riser, "She has a sedentary life but does do ADLs and is not limited by angina or HF with this level of exertion. Her CP is chronic and atypical for cardiac etiology, therefore, I do not think we need to pursue ischemic evaluation at this time. I think she is okay to proceed with surgery at this time from a cardiac perspective as long as her echo doesn't have any significant findings which I do not expect. Her ECG suggests inferior infarct so I will further evaluate with an echocardiogram." Pt never went to get the Echo done, it appears she no showed twice.  Discussed with Dr. Daiva Huge, pt will need to get Echo done prior to proceeding as cardiologist suggested.  Information given to Dr. Amie [place] nurse.   Pt has called me several times irate that her surgery cannot proceed without an echo per cardiologist recommendation.  Dr. Amie [place] scheduler called and stated that she cannot get in touch with Dr. Kieth Brightly and therefore will not cancel the surgery.  She states that the pt will show up tomorrow for  surgery regardless.  Discussed again with Dr. Daiva Huge. VS: BP 131/83   Pulse 91   Temp 37.2 C (Oral)   Resp 18   Ht _0  (1.626 m)   Wt 93.9 kg   LMP 02/03/2018   SpO2 98%   BMI 35.53 kg/m   PROVIDERS: [facility], [place], Marinell Blight, MD with Hematology and Oncology  Alphia Moh, MD is Cardiologist  LABS: Labs reviewed: Acceptable for surgery. (all labs ordered are listed, but only abnormal results are displayed)  Labs Reviewed  BASIC METABOLIC PANEL - Abnormal; Notable for the following components:      Result Value   Glucose, Bld 107 (*)    All other components within normal limits  CBC WITH DIFFERENTIAL/PLATELET - Abnormal; Notable for the following components:   MCH 25.5 (*)    RDW 15.9 (*)    Platelets 404 (*)    All other components within normal limits  HEMOGLOBIN A1C - Abnormal; Notable for the following components:   Hgb A1c MFr Bld 6.4 (*)    All other components within normal limits     IMAGES: DG Abdomen Acute w/1V Chest 08/18/18 FINDINGS: Stable cardiomediastinal silhouette with cardiomegaly. No pneumothorax. No pleural effusion. Mild bibasilar atelectasis. No pulmonary edema. No consolidative airspace disease. No dilated small bowel loops or significant air-fluid levels. Mild colonic stool. No evidence of pneumatosis or pneumoperitoneum. No radiopaque nephrolithiasis. Left total hip arthroplasty. Lower lumbar  spondylosis.  IMPRESSION: 1. Stable mild cardiomegaly without pulmonary edema. 2. Mild bibasilar atelectasis. 3. Nonobstructive bowel gas pattern.  EKG: 10/02/2018 Rate 79 bpm Sinus rhythm with 1st degree A-V block Moderate voltage criteria for LVH, may be normal variant Borderline ECG No significant change since last tracing  CV:  Past Medical History:  Diagnosis Date  . Anemia   . Asthma   . Chest pain last week  . Chronic lower back pain   . Diverticulitis   . GERD (gastroesophageal reflux  disease)   . History of blood transfusion    "HgB low w/lupus flareup" (08/19/2018)  . History of ITP 2014   NOT PROBLEM NOW  . Rheumatoid arthritis ([place])    "legs; all my bones" (08/19/2018)  . SLE (systemic lupus erythematosus) ([place])   . Umbilical hernia     Past Surgical History:  Procedure Laterality Date  . HERNIA REPAIR    . stomach ulcer surgery  2008  . TONSILLECTOMY    . TOTAL HIP ARTHROPLASTY  06/14/2012   Procedure: TOTAL HIP ARTHROPLASTY ANTERIOR APPROACH;  Surgeon: Mcarthur Rossetti, MD;  Location: [facility];  Service: Orthopedics;  Laterality: Left;  Left Total Hip Arthroplasty, Anterior Approach C-Arm  . TUBAL LIGATION    . UMBILICAL HERNIA REPAIR      MEDICATIONS: . acetaminophen (TYLENOL) 325 MG tablet  . albuterol (PROVENTIL HFA;VENTOLIN HFA) 108 (90 Base) MCG/ACT inhaler  . budesonide-formoterol (SYMBICORT) 160-4.5 MCG/ACT inhaler  . chlorpheniramine (CHLOR-TRIMETON) 4 MG tablet  . fluticasone (FLONASE) 50 MCG/ACT nasal spray  . fluticasone furoate-vilanterol (BREO ELLIPTA) 200-25 MCG/INH AEPB  . lactobacillus (FLORANEX/LACTINEX) PACK  . Nicotine 21-14-7 MG/24HR KIT  . ondansetron (ZOFRAN) 4 MG tablet  . oxyCODONE-acetaminophen (PERCOCET/ROXICET) 5-325 MG tablet  . oxyCODONE-acetaminophen (PERCOCET/ROXICET) 5-325 MG tablet  . pantoprazole (PROTONIX) 40 MG tablet  . sucralfate (CARAFATE) 1 g tablet   No current facility-administered medications for this encounter.      Maia Plan [facility] Pre-Surgical Testing [phone] 10/07/18 11:41 AM

## 2018-10-08 ENCOUNTER — Inpatient Hospital Stay (HOSPITAL_COMMUNITY): Admission: RE | Admit: 2018-10-08 | Payer: Medicaid Other | Source: Home / Self Care | Admitting: General Surgery

## 2018-10-08 ENCOUNTER — Encounter (HOSPITAL_COMMUNITY): Admission: RE | Payer: Self-pay | Source: Home / Self Care

## 2018-10-08 SURGERY — COLON RESECTION
Anesthesia: General

## 2018-10-08 NOTE — Progress Notes (Signed)
Pt showed up for surgery 10/08/18 unaware procedure had been cancelled by Dr Kieth Brightly. per OR, due to medical clearance. Dr Kieth Brightly is in OR can speak to pt at approx 10. Pt notified and refuses to wait to speak with MD.  She was on OR schedule at 3pm yesterday off by 0630 am today.   Pt and her sister called back at 85 requesting Dr Kieth Brightly to call them @ [phone] when he is done with surgery.  Pt sister name is Davy Pique, Pt gives consent to speak with her.  Name and number given to [place] desk.

## 2018-10-22 NOTE — Pre-Procedure Instructions (Signed)
Dana Kennedy  10/22/2018      [place], [place] - [address] [place] [address] Phone: [phone] Fax: [phone]  [id number] - [place], [place] - [address] [place] [address] Phone: [phone] Fax: [phone]    Your procedure is scheduled on Thursday February 6th.  Report to [facility] Admitting at 6:30 A.M.  Call this number if you have problems the morning of surgery:  [phone]   Remember:  Do not eat or drink after midnight.     Take these medicines the morning of surgery with A SIP OF WATER  budesonide-formoterol (SYMBICORT) fluticasone (FLONASE) fluticasone furoate-vilanterol (BREO ELLIPTA)  acetaminophen (TYLENOL) if needed albuterol (PROVENTIL HFA;VENTOLIN HFA) -if needed. *Bring inhaler with you to the hospital on the morning of your surgery* ondansetron (ZOFRAN)-if needed oxyCODONE-acetaminophen (PERCOCET/ROXICET)-if needed pantoprazole (PROTONIX)   7 days prior to surgery STOP taking any Aspirin(unless otherwise instructed by your surgeon), Aleve, Naproxen, Ibuprofen, Motrin, Advil, Goody's, BC's, all herbal medications, fish oil, and all vitamins   Do not wear jewelry, make-up or nail polish.  Do not wear lotions, powders, or perfumes, or deodorant.  Do not shave 48 hours prior to surgery.    Do not bring valuables to the hospital.  [facility] is not responsible for any belongings or valuables.  Contacts, eyeglasses, hearing aids,dentures or bridgework may not be worn into surgery.  Leave your suitcase in the car.  After surgery it may be brought to your room.  For patients admitted to the hospital, discharge time will be determined by your treatment team.  Patients discharged the day of surgery will not be allowed to drive home.   [place]- Preparing  For Surgery  Before surgery, you can play an important role. Because skin is not sterile, your skin needs to be as free of germs as possible. You can reduce the number of germs on your skin by washing with CHG (chlorahexidine gluconate) Soap before surgery.  CHG is an antiseptic cleaner which kills germs and bonds with the skin to continue killing germs even after washing.    Oral Hygiene is also important to reduce your risk of infection.  Remember - BRUSH YOUR TEETH THE MORNING OF SURGERY WITH YOUR REGULAR TOOTHPASTE  Please do not use if you have an allergy to CHG or antibacterial soaps. If your skin becomes reddened/irritated stop using the CHG.  Do not shave (including legs and underarms) for at least 48 hours prior to first CHG shower. It is OK to shave your face.  Please follow these instructions carefully.   1. Shower the NIGHT BEFORE SURGERY and the MORNING OF SURGERY with CHG.   2. If you chose to wash your hair, wash your hair first as usual with your normal shampoo.  3. After you shampoo, rinse your hair and body thoroughly to remove the shampoo.  4. Use CHG as you would any other liquid soap. You can apply CHG directly to the skin and wash gently with a scrungie or a clean washcloth.   5. Apply the CHG Soap to your body ONLY FROM THE NECK DOWN.  Do not use on open wounds or open sores. Avoid contact with your eyes, ears, mouth and genitals (private parts). Wash Face and genitals (private parts)  with your normal soap.  6. Wash thoroughly, paying special attention  to the area where your surgery will be performed.  7. Thoroughly rinse your body with warm water from the neck down.  8. DO NOT shower/wash with your normal soap after using and rinsing off the CHG Soap.  9. Pat yourself dry with a CLEAN TOWEL.  10. Wear CLEAN PAJAMAS to bed the night before surgery, wear comfortable clothes the morning of surgery  11. Place CLEAN SHEETS on your bed the night of your first shower  and DO NOT SLEEP WITH PETS.    Day of Surgery: Shower as stated above. Do not apply any deodorants/lotions.  Please wear clean clothes to the hospital/surgery center.   Remember to brush your teeth WITH YOUR REGULAR TOOTHPASTE.   Please read over the following fact sheets that you were given.

## 2018-10-23 ENCOUNTER — Encounter (HOSPITAL_COMMUNITY): Payer: Self-pay

## 2018-10-23 ENCOUNTER — Emergency Department (HOSPITAL_COMMUNITY)
Admission: EM | Admit: 2018-10-23 | Discharge: 2018-10-23 | Disposition: A | Discharge status: Home / Self Care | Payer: Medicaid Other | Source: Home / Self Care | Attending: Emergency Medicine | Admitting: Emergency Medicine

## 2018-10-23 ENCOUNTER — Encounter (HOSPITAL_COMMUNITY): Payer: Self-pay | Admitting: Anesthesiology

## 2018-10-23 ENCOUNTER — Other Ambulatory Visit: Payer: Self-pay

## 2018-10-23 ENCOUNTER — Encounter (HOSPITAL_COMMUNITY)
Admission: RE | Admit: 2018-10-23 | Discharge: 2018-10-23 | Disposition: A | Discharge status: Home / Self Care | Payer: Medicaid Other | Source: Ambulatory Visit | Attending: General Surgery | Admitting: General Surgery

## 2018-10-23 ENCOUNTER — Ambulatory Visit: Payer: Self-pay | Admitting: General Surgery

## 2018-10-23 ENCOUNTER — Encounter (HOSPITAL_COMMUNITY): Payer: Self-pay | Admitting: *Deleted

## 2018-10-23 DIAGNOSIS — K429 Umbilical hernia without obstruction or gangrene: Secondary | ICD-10-CM

## 2018-10-23 DIAGNOSIS — K4090 Unilateral inguinal hernia, without obstruction or gangrene, not specified as recurrent: Secondary | ICD-10-CM

## 2018-10-23 DIAGNOSIS — Z87891 Personal history of nicotine dependence: Secondary | ICD-10-CM

## 2018-10-23 DIAGNOSIS — Z01812 Encounter for preprocedural laboratory examination: Secondary | ICD-10-CM

## 2018-10-23 DIAGNOSIS — Z79899 Other long term (current) drug therapy: Secondary | ICD-10-CM | POA: Insufficient documentation

## 2018-10-23 DIAGNOSIS — J45909 Unspecified asthma, uncomplicated: Secondary | ICD-10-CM

## 2018-10-23 LAB — CBC
HCT: 41.3 % (ref 36.0–46.0)
HCT: 38.2 % (ref 36.0–46.0)
Hemoglobin: 13 g/dL (ref 12.0–15.0)
Hemoglobin: 12.1 g/dL (ref 12.0–15.0)
MCH: 25.5 pg — ABNORMAL LOW (ref 26.0–34.0)
MCH: 25.7 pg — ABNORMAL LOW (ref 26.0–34.0)
MCHC: 31.5 g/dL (ref 30.0–36.0)
MCHC: 31.7 g/dL (ref 30.0–36.0)
MCV: 81.1 fL (ref 80.0–100.0)
MCV: 81.1 fL (ref 80.0–100.0)
Platelets: 395 10*3/uL (ref 150–400)
Platelets: 371 10*3/uL (ref 150–400)
RBC: 5.09 MIL/uL (ref 3.87–5.11)
RBC: 4.71 MIL/uL (ref 3.87–5.11)
RDW: 16 % — ABNORMAL HIGH (ref 11.5–15.5)
RDW: 16.1 % — ABNORMAL HIGH (ref 11.5–15.5)
WBC: 6.1 10*3/uL (ref 4.0–10.5)
WBC: 5.8 10*3/uL (ref 4.0–10.5)
nRBC: 0 % (ref 0.0–0.2)
nRBC: 0 % (ref 0.0–0.2)

## 2018-10-23 LAB — BASIC METABOLIC PANEL
Anion gap: 13 (ref 5–15)
Anion gap: 11 (ref 5–15)
BUN: 7 mg/dL (ref 6–20)
BUN: 7 mg/dL (ref 6–20)
CO2: 23 mmol/L (ref 22–32)
CO2: 23 mmol/L (ref 22–32)
Calcium: 9.2 mg/dL (ref 8.9–10.3)
Calcium: 9 mg/dL (ref 8.9–10.3)
Chloride: 106 mmol/L (ref 98–111)
Chloride: 109 mmol/L (ref 98–111)
Creatinine, Ser: 0.61 mg/dL (ref 0.44–1.00)
Creatinine, Ser: 0.64 mg/dL (ref 0.44–1.00)
GFR calc non Af Amer: >60 mL/min (ref >60)
Glucose, Bld: 113 mg/dL — ABNORMAL HIGH (ref 70–99)
Glucose, Bld: 112 mg/dL — ABNORMAL HIGH (ref 70–99)
Potassium: 3.6 mmol/L (ref 3.5–5.1)
Potassium: 3.5 mmol/L (ref 3.5–5.1)
SODIUM: 143 mmol/L (ref 135–145)
Sodium: 142 mmol/L (ref 135–145)

## 2018-10-23 LAB — TYPE AND SCREEN
ABO/RH(D): A POS
Antibody Screen: NEGATIVE

## 2018-10-23 LAB — LACTIC ACID, PLASMA: Lactic Acid, Venous: 2 mmol/L (ref 0.5–1.9)

## 2018-10-23 MED ORDER — ONDANSETRON HCL 4 MG/2ML IJ SOLN
4.0000 mg | Freq: Once | INTRAMUSCULAR | Status: AC
  Administered 2018-10-23: 4 mg via INTRAVENOUS
  Filled 2018-10-23: qty 2

## 2018-10-23 MED ORDER — MORPHINE SULFATE (PF) 4 MG/ML IV SOLN
4.0000 mg | Freq: Once | INTRAVENOUS | Status: AC
  Administered 2018-10-23: 4 mg via INTRAVENOUS

## 2018-10-23 MED ORDER — HYDROMORPHONE HCL 1 MG/ML IJ SOLN: 1.0000 mg | Freq: Once | INTRAMUSCULAR | Status: DC

## 2018-10-23 MED ORDER — MORPHINE SULFATE (PF) 4 MG/ML IV SOLN
4.0000 mg | Freq: Once | INTRAVENOUS | Status: DC
  Filled 2018-10-23: qty 1

## 2018-10-23 NOTE — Anesthesia Preprocedure Evaluation (Addendum)
Anesthesia Evaluation  Patient identified by MRN, date of birth, ID band Patient awake    Reviewed: Allergy & Precautions, NPO status , Patient's Chart, lab work & pertinent test results  Airway Mallampati: II  TM Distance: >3 FB Neck ROM: Full    Dental  (+) Teeth Intact, Caps,    Pulmonary asthma , former smoker,  REcently quit smoking   Pulmonary exam normal breath sounds clear to auscultation       Cardiovascular negative cardio ROS   Rhythm:Regular Rate:Normal     Neuro/Psych negative neurological ROS  negative psych ROS   GI/Hepatic Neg liver ROS, GERD  Controlled and Medicated,Diverticulitis   Endo/Other  Obesity  Renal/GU negative Renal ROS  negative genitourinary   Musculoskeletal  (+) Arthritis , Rheumatoid disorders,    Abdominal (+) + obese,   Peds  Hematology  (+) anemia , SLE Hx/o ITP Hx/o [name]   Anesthesia Other Findings   Reproductive/Obstetrics                           Anesthesia Physical Anesthesia Plan  ASA: III  Anesthesia Plan: General   Post-op Pain Management:    Induction: Intravenous  PONV Risk Score and Plan: 4 or greater and Scopolamine patch - Pre-op, Midazolam, Dexamethasone, Ondansetron and Treatment may vary due to age or medical condition  Airway Management Planned: Oral ETT  Additional Equipment:   Intra-op Plan:   Post-operative Plan: Extubation in OR  Informed Consent: I have reviewed the patients History and Physical, chart, labs and discussed the procedure including the risks, benefits and alternatives for the proposed anesthesia with the patient or authorized representative who has indicated his/her understanding and acceptance.     Dental advisory given  Plan Discussed with: CRNA and Surgeon  Anesthesia Plan Comments: (PAT note written 10/23/2018 by Myra Gianotti, PA-C. )      Anesthesia Quick Evaluation

## 2018-10-23 NOTE — Progress Notes (Signed)
Spoke with Abigail Butts, triage RN at [facility] regarding pt's bowel prep instructions, antibiotics, ensures, and miralax. Since pt was a rescheduled surgery, office was unaware that she had already used the previous prep prior to coming in on 10/08/18. Abigail Butts will contact patient and provide instructions as prep needs to start today.   Jacqlyn Larsen, RN

## 2018-10-23 NOTE — ED Triage Notes (Signed)
Pt states frequent problems with umbilical hernia.

## 2018-10-23 NOTE — ED Notes (Signed)
ED Provider at bedside. 

## 2018-10-23 NOTE — Discharge Instructions (Addendum)
Contact a health care provider if: Your hernia gets larger. Your hernia becomes painful. Get help right away if: You develop sudden, severe pain near the area of your hernia. You have pain as well as nausea or vomiting. You have pain and the skin over your hernia changes color. You develop a fever.

## 2018-10-23 NOTE — Progress Notes (Addendum)
PCP - No PCP; pt visits [facility] as needed. Cardiologist - Dr. Philbert Riser  Chest x-ray - 12/25/17 EKG - 10/02/18 Stress Test - denies ECHO - 10/11/18-care everywhere Cardiac Cath - denies  Sleep Study - denies  Blood Thinner Instructions:N/A Aspirin Instructions:N/A  Anesthesia review: Yes, recently cancelled sx d/t needed an Echocardiogram. Results in care everywhere.   Pt states that she did not receive any bowel prep instructions, antibiotics, ensure drinks, gatorade or miralax for this procedure this time around. Called [facility] but they are not open at this time. Will call again when office opens. Pt left call back numbers for instructions.   Patient denies shortness of breath, fever, cough and chest pain at PAT appointment   Patient verbalized understanding of instructions that were given to them at the PAT appointment. Patient was also instructed that they will need to review over the PAT instructions again at home before surgery.

## 2018-10-23 NOTE — ED Provider Notes (Signed)
[place] [facility] EMERGENCY DEPARTMENT Provider Note   CSN: [id number] Arrival date & time: 10/23/18  9379     History   Chief Complaint Chief Complaint  Patient presents with  . Hernia    HPI Dana Kennedy is a 53 y.o. female who presents emergency department with chief complaint of umbilical hernia pain.  Patient states that she began having pain in her umbilicus 3 days ago with that was firm and hard.  Patient denies fever chills, nausea or vomiting.  HPI  Past Medical History:  Diagnosis Date  . Anemia   . Asthma   . Chest pain    atypcial chest pain evaluation 07/10/18 Philbert Riser, Coletta Memos, MD), echo order (NL LVEF, wall motion 09/2018)  . Chronic lower back pain   . Diverticulitis   . GERD (gastroesophageal reflux disease)   . History of blood transfusion    "HgB low w/lupus flareup" (08/19/2018)  . History of ITP 2014   NOT PROBLEM NOW  . Rheumatoid arthritis ([place])    "legs; all my bones" (08/19/2018)  . SLE (systemic lupus erythematosus) ([place])   . Umbilical hernia     Patient Active Problem List   Diagnosis Date Noted  . Acute diverticulitis 08/18/2018  . Vaginal spotting 08/18/2018  . GERD (gastroesophageal reflux disease)   . Asthma   . 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 Continuous tobacco abuse 05/02/2016  . 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 Status post tonsillectomy 05/02/2016  . Dehydration 10/12/2013  . Idiopathic thrombocytopenic purpura ([place]) 09/05/2012  . Lupus (systemic lupus erythematosus) ( [place]) 09/05/2012  . 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 HERNIA REPAIR    . stomach ulcer surgery  2008  . TONSILLECTOMY    . TOTAL HIP ARTHROPLASTY  06/14/2012   Procedure: TOTAL HIP ARTHROPLASTY ANTERIOR APPROACH;  Surgeon: Mcarthur Rossetti, MD;  Location: [facility];  Service: Orthopedics;  Laterality: Left;  Left Total Hip  Arthroplasty, Anterior Approach C-Arm  . TUBAL LIGATION    . UMBILICAL HERNIA REPAIR       OB History   No obstetric history on file.      Home Medications    Prior to Admission medications   Medication Sig Start Date End Date Taking? Authorizing Provider  acetaminophen (TYLENOL) 325 MG tablet Take 2 tablets (650 mg total) by mouth every 6 (six) hours as needed for mild pain (or Fever >/= 101). 08/26/18  Yes Emokpae, Courage, MD  albuterol (PROVENTIL HFA;VENTOLIN HFA) 108 (90 Base) MCG/ACT inhaler Inhale 2 puffs into the lungs every 4 (four) hours as needed for wheezing or shortness of breath. 04/14/17  Yes Pollina, Gwenyth Allegra, MD  budesonide-formoterol (SYMBICORT) 160-4.5 MCG/ACT inhaler Inhale 2 puffs into the lungs 2 (two) times daily. 11/12/17  Yes Mannam, Praveen, MD  chlorpheniramine (CHLOR-TRIMETON) 4 MG tablet Take 2 tablets (8 mg total) by mouth 3 (three) times daily. 12/25/17  Yes Mannam, Praveen, MD  fluticasone (FLONASE) 50 MCG/ACT nasal spray Place 2 sprays into both nostrils daily. 11/12/17  Yes Mannam, Praveen, MD  fluticasone furoate-vilanterol (BREO ELLIPTA) 200-25 MCG/INH AEPB Inhale 1 puff into the lungs daily. 06/10/18  Yes Mannam, Praveen, MD  olopatadine (PATANOL) 0.1 % ophthalmic solution Place 1 drop into both eyes 2 (two) times daily as needed for allergies.   Yes [provider]  ondansetron (ZOFRAN) 4 MG tablet Take 1 tablet (4 mg total) by mouth  every 6 (six) hours as needed for nausea. 08/26/18  Yes Emokpae, Courage, MD  oxyCODONE-acetaminophen (PERCOCET/ROXICET) 5-325 MG tablet Take 1 tablet by mouth every 4 (four) hours as needed for severe pain. 08/26/18  Yes Emokpae, Courage, MD  pantoprazole (PROTONIX) 40 MG tablet Take 1 tablet (40 mg total) by mouth daily. 08/26/18  Yes Emokpae, Courage, MD  lactobacillus (FLORANEX/LACTINEX) PACK Take 1 packet (1 g total) by mouth 3 (three) times daily with meals. Patient not taking: Reported on 10/02/2018 08/26/18    Roxan Hockey, MD  Nicotine 21-14-7 MG/24HR KIT Take as directed Patient not taking: Reported on 10/23/2018 03/05/18   Marshell Garfinkel, MD  oxyCODONE-acetaminophen (PERCOCET/ROXICET) 5-325 MG tablet Take 1 tablet by mouth every 4 (four) hours as needed for severe pain. Patient not taking: Reported on 09/09/2018 08/31/18   Tegeler, Gwenyth Allegra, MD  sucralfate (CARAFATE) 1 g tablet Take 1 tablet (1 g total) by mouth 4 (four) times daily. Patient not taking: Reported on 10/02/2018 02/01/18   Lacretia Leigh, MD    Family History Family History  Problem Relation Age of Onset  . Cancer Mother   . Hypertension Mother     Social History Social History   Tobacco Use  . Smoking status: Former Smoker    Packs/day: 2.50    Years: 29.00    Pack years: 72.50    Types: Cigarettes    Last attempt to quit: 09/01/2018    Years since quitting: 0.1  . Smokeless tobacco: Never Used  Substance Use Topics  . Alcohol use: Yes    Comment: 08/19/2018 "glass of wine a couple times/year"  . Drug use: No     Allergies   Aspirin; Eszopiclone; Allegra [fexofenadine]; and Restoril [temazepam]   Review of Systems Review of Systems Ten systems reviewed and are negative for acute change, except as noted in the HPI.    Physical Exam Updated Vital Signs BP (!) 128/96 (BP Location: Right Arm)   Pulse 72   Temp 98.3 F (36.8 C) (Oral)   Resp 18   Ht _0  (1.626 m)   Wt 94.8 kg   LMP 02/03/2018   SpO2 98%   BMI 35.87 kg/m   Physical Exam Abdominal:     General: Abdomen is protuberant. A surgical scar is present.     Hernia: A hernia is present. Hernia is present in the umbilical area.        ED Treatments / Results  Labs (all labs ordered are listed, but only abnormal results are displayed) Labs Reviewed  CBC - Abnormal; Notable for the following components:      Result Value   MCH 25.7 (*)    RDW 16.1 (*)    All other components within normal limits  BASIC METABOLIC PANEL -  Abnormal; Notable for the following components:   Glucose, Bld 112 (*)    All other components within normal limits  LACTIC ACID, PLASMA - Abnormal; Notable for the following components:   Lactic Acid, Venous 2.0 (*)    All other components within normal limits    EKG None  Radiology No results found.  Procedures Hernia reduction Date/Time: 10/23/2018 12:32 PM Performed by: Margarita Mail, PA-C Authorized by: Margarita Mail, PA-C  Consent: Verbal consent obtained. Risks and benefits: risks, benefits and alternatives were discussed Consent given by: patient Patient identity confirmed: verbally with patient Local anesthesia used: no  Anesthesia: Local anesthesia used: no  Sedation: Patient sedated: no  Patient tolerance: Patient tolerated the procedure well  with no immediate complications    (including critical care time)  Medications Ordered in ED Medications  ondansetron (ZOFRAN) injection 4 mg (4 mg Intravenous Given 10/23/18 0943)  morphine 4 MG/ML injection 4 mg (4 mg Intravenous Given 10/23/18 0942)     Initial Impression / Assessment and Plan / ED Course  I have reviewed the triage vital signs and the nursing notes.  Pertinent labs & imaging results that were available during my care of the patient were reviewed by me and considered in my medical decision making (see chart for details).     Patient Hernia resolved with Ice pack, Trendelenburg and gentle pressure. She has an established relationship with [facility] and is scheduled for an abdominal surgery tomorrow with Dr. Kieth Brightly. The patient appears appropriate for discharge at this time.  Final Clinical Impressions(s) / ED Diagnoses   Final diagnoses:  Umbilical hernia without obstruction and without gangrene    ED Discharge Orders    None       Margarita Mail, PA-C 10/23/18 1235    Sherwood Gambler, MD 10/23/18 1520

## 2018-10-23 NOTE — Progress Notes (Addendum)
Anesthesia Chart Review:  Case:  [id number] Date/Time:  10/24/18 0815   Procedure:  PARTIAL COLON RESECTION WITH ANASTOMOSIS POSSIBLE OSTOMY ERAS PATHWAY (N/A )   Anesthesia type:  General   Pre-op diagnosis:  diverticulitis   Location:  [facility] OR ROOM 01 / [place] OR   Surgeon:  Kinsinger, Arta Bruce, MD      DISCUSSION: Patient is a 53 year old female scheduled for the above procedure. Case was initially scheduled at [facility] on 10/08/18, but delayed to complete previously recommended echocardiogram as part of a preoperative clearance. She has since had the echo which was unremarkable (see below).  History includes former smoker (quit 09/01/18), anemia, asthma, GERD, SLE, RA, diverticulitis, ITP (2011, relapse 2014), atypical chest pain (07/10/18 cardiology evaluation).   Last seen by hematology 02/03/18 with stable platelet count and return follow up in 6 months.  Platelet count 395K with PAT labs. She has previously been seen by rheumatology about 5 years ago.    She was seen by cardiologist Alphia Moh, MD on 07/10/18  for preoperative clearance for umbilical hernia repair. Per note in [place], "She has a sedentary life but does do ADLs and is not limited by angina or HF with this level of exertion. Her CP is chronic and atypical for cardiac etiology, therefore, I do not think we need to pursue ischemic evaluation at this time. I think she is okay to proceed with surgery at this time from a cardiac perspective as long as her echo doesn't have any significant findings which I do not expect. Her ECG suggests inferior infarct so I will further evaluate with an echocardiogram."   PAT RN contacted [facility] this morning to arrange bowel prep since patient reported that new supplies had not been given to her since previous surgery cancelled. Also of note, it appears that following her PAT visit she went to the ED for for umbilical hernia pain x 3 days. No N/V. Lactic acid mildly elevated at 2.0. WBC WNL.  Umbilical hernia reduced in ED and discharged home per provider with plans for surgery as scheduled.     VS: BP (!) 142/89   Pulse 79   Temp 36.9 C   Resp 20   Ht _0  (1.626 m)   Wt 95 kg   LMP 02/03/2018   SpO2 99%   BMI 35.94 kg/m   PROVIDERS: [facility] provides primary care - Lebron Conners, Marinell Blight, MD with Hematology and Oncology [facility]) - Alphia Moh, MD is Cardiologist ([place])   LABS: 10/23/18 PAT labs reviewed and appear acceptable for OR. A1c 10/02/18 was 6.4. However, she also had elevated lactic acid of 2.0 in the ED at 9:42 AM. Work-up is on-going. (all labs ordered are listed, but only abnormal results are displayed)  Labs Reviewed  BASIC METABOLIC PANEL - Abnormal; Notable for the following components:      Result Value   Glucose, Bld 113 (*)    All other components within normal limits  CBC - Abnormal; Notable for the following components:   MCH 25.5 (*)    RDW 16.0 (*)    All other components within normal limits  TYPE AND SCREEN   PFTs 12/25/17: FVC 1.84 (61%), post 2.05 (68%). FEV1 1.52 (63%), post 1.71 (71%). DLCO unc 20.78 (81%), cor 21.40 (83%).   IMAGES: DG Abdomen Acute w/1V Chest 08/18/18 FINDINGS: Stable cardiomediastinal silhouette with cardiomegaly. No pneumothorax. No pleural effusion. Mild bibasilar atelectasis. No pulmonary edema. No  consolidative airspace disease. No dilated small bowel loops or significant air-fluid levels. Mild colonic stool. No evidence of pneumatosis or pneumoperitoneum. No radiopaque nephrolithiasis. Left total hip arthroplasty. Lower lumbar spondylosis. IMPRESSION: 1. Stable mild cardiomegaly without pulmonary edema. 2. Mild bibasilar atelectasis. 3. Nonobstructive bowel gas pattern.   EKG: 10/02/2018 Rate 79 bpm Sinus rhythm with 1st degree A-V block Moderate voltage criteria for LVH, may be normal variant Borderline ECG No significant change since last  tracing   CV: Echo 10/11/18 ([place]): SUMMARY The left ventricular size is normal. Mild left ventricular hypertrophy Left ventricular systolic function is normal. LV ejection fraction = 65-70%. Left ventricular filling pattern is prolonged relaxation. The left ventricular wall motion is normal. The right ventricular systolic function is normal. There is no significant valvular stenosis or regurgitation. The aortic sinus is normal size. IVC size was normal. There is no pericardial effusion. There is no comparison study available.   Past Medical History:  Diagnosis Date  . Anemia   . Asthma   . Chest pain last week  . Chronic lower back pain   . Diverticulitis   . GERD (gastroesophageal reflux disease)   . History of blood transfusion    "HgB low w/lupus flareup" (08/19/2018)  . History of ITP 2014   NOT PROBLEM NOW  . Rheumatoid arthritis ([place])    "legs; all my bones" (08/19/2018)  . SLE (systemic lupus erythematosus) ([place])   . Umbilical hernia     Past Surgical History:  Procedure Laterality Date  . HERNIA REPAIR    . stomach ulcer surgery  2008  . TONSILLECTOMY    . TOTAL HIP ARTHROPLASTY  06/14/2012   Procedure: TOTAL HIP ARTHROPLASTY ANTERIOR APPROACH;  Surgeon: Mcarthur Rossetti, MD;  Location: [facility];  Service: Orthopedics;  Laterality: Left;  Left Total Hip Arthroplasty, Anterior Approach C-Arm  . TUBAL LIGATION    . UMBILICAL HERNIA REPAIR      MEDICATIONS: . acetaminophen (TYLENOL) 325 MG tablet  . albuterol (PROVENTIL HFA;VENTOLIN HFA) 108 (90 Base) MCG/ACT inhaler  . budesonide-formoterol (SYMBICORT) 160-4.5 MCG/ACT inhaler  . chlorpheniramine (CHLOR-TRIMETON) 4 MG tablet  . fluticasone (FLONASE) 50 MCG/ACT nasal spray  . fluticasone furoate-vilanterol (BREO ELLIPTA) 200-25 MCG/INH AEPB  . lactobacillus (FLORANEX/LACTINEX) PACK  . Nicotine 21-14-7 MG/24HR KIT  . ondansetron (ZOFRAN) 4 MG tablet  . oxyCODONE-acetaminophen  (PERCOCET/ROXICET) 5-325 MG tablet  . oxyCODONE-acetaminophen (PERCOCET/ROXICET) 5-325 MG tablet  . pantoprazole (PROTONIX) 40 MG tablet  . sucralfate (CARAFATE) 1 g tablet   No current facility-administered medications for this encounter.     Myra Gianotti, PA-C Surgical Short Stay/Anesthesiology [facility] Phone [phone] [facility] Phone [phone] 10/23/2018 1:09 PM

## 2018-10-23 NOTE — ED Notes (Signed)
Ace wrap applied

## 2018-10-24 ENCOUNTER — Encounter (HOSPITAL_COMMUNITY)
Admission: RE | Disposition: A | Discharge status: Home / Self Care | Payer: Self-pay | Source: Home / Self Care | Attending: General Surgery

## 2018-10-24 ENCOUNTER — Inpatient Hospital Stay (HOSPITAL_COMMUNITY): Payer: Medicaid Other | Admitting: Physician Assistant

## 2018-10-24 ENCOUNTER — Encounter (HOSPITAL_COMMUNITY): Payer: Self-pay | Admitting: Certified Registered Nurse Anesthetist

## 2018-10-24 ENCOUNTER — Inpatient Hospital Stay (HOSPITAL_COMMUNITY)
Admission: RE | Admit: 2018-10-24 | Discharge: 2018-10-29 | DRG: 331 | Disposition: A | Discharge status: Home / Self Care | Payer: Medicaid Other | Attending: General Surgery | Admitting: General Surgery

## 2018-10-24 DIAGNOSIS — Z6836 Body mass index (BMI) 36.0-36.9, adult: Secondary | ICD-10-CM | POA: Diagnosis not present

## 2018-10-24 DIAGNOSIS — J45909 Unspecified asthma, uncomplicated: Secondary | ICD-10-CM | POA: Diagnosis present

## 2018-10-24 DIAGNOSIS — Z96642 Presence of left artificial hip joint: Secondary | ICD-10-CM | POA: Diagnosis present

## 2018-10-24 DIAGNOSIS — K5732 Diverticulitis of large intestine without perforation or abscess without bleeding: Secondary | ICD-10-CM | POA: Diagnosis present

## 2018-10-24 DIAGNOSIS — Z8249 Family history of ischemic heart disease and other diseases of the circulatory system: Secondary | ICD-10-CM | POA: Diagnosis not present

## 2018-10-24 DIAGNOSIS — Z87891 Personal history of nicotine dependence: Secondary | ICD-10-CM

## 2018-10-24 DIAGNOSIS — M069 Rheumatoid arthritis, unspecified: Secondary | ICD-10-CM | POA: Diagnosis present

## 2018-10-24 DIAGNOSIS — E669 Obesity, unspecified: Secondary | ICD-10-CM | POA: Diagnosis present

## 2018-10-24 DIAGNOSIS — M329 Systemic lupus erythematosus, unspecified: Secondary | ICD-10-CM | POA: Diagnosis present

## 2018-10-24 DIAGNOSIS — K429 Umbilical hernia without obstruction or gangrene: Secondary | ICD-10-CM | POA: Diagnosis present

## 2018-10-24 DIAGNOSIS — Z809 Family history of malignant neoplasm, unspecified: Secondary | ICD-10-CM

## 2018-10-24 HISTORY — PX: PARTIAL COLECTOMY: SHX5273

## 2018-10-24 LAB — CBC WITH DIFFERENTIAL/PLATELET
Abs Immature Granulocytes: 0.01 10*3/uL (ref 0.00–0.07)
BASOS ABS: 0.1 10*3/uL (ref 0.0–0.1)
Basophils Relative: 1 %
Eosinophils Absolute: 0.2 10*3/uL (ref 0.0–0.5)
Eosinophils Relative: 4 %
HCT: 38.4 % (ref 36.0–46.0)
Hemoglobin: 11.9 g/dL — ABNORMAL LOW (ref 12.0–15.0)
Immature Granulocytes: 0 %
Lymphocytes Relative: 41 %
Lymphs Abs: 2.4 10*3/uL (ref 0.7–4.0)
MCH: 25.4 pg — ABNORMAL LOW (ref 26.0–34.0)
MCHC: 31 g/dL (ref 30.0–36.0)
MCV: 82.1 fL (ref 80.0–100.0)
Monocytes Absolute: 0.5 10*3/uL (ref 0.1–1.0)
Monocytes Relative: 9 %
NEUTROS PCT: 45 %
NRBC: 0 % (ref 0.0–0.2)
Neutro Abs: 2.6 10*3/uL (ref 1.7–7.7)
Platelets: 378 10*3/uL (ref 150–400)
RBC: 4.68 MIL/uL (ref 3.87–5.11)
RDW: 16 % — ABNORMAL HIGH (ref 11.5–15.5)
WBC: 5.7 10*3/uL (ref 4.0–10.5)

## 2018-10-24 LAB — HEPATIC FUNCTION PANEL
ALT: 15 U/L (ref 0–44)
AST: 17 U/L (ref 15–41)
Albumin: 3.5 g/dL (ref 3.5–5.0)
Alkaline Phosphatase: 71 U/L (ref 38–126)
Bilirubin, Direct: <0.1 mg/dL (ref 0.0–0.2)
Total Bilirubin: 0.5 mg/dL (ref 0.3–1.2)
Total Protein: 6.1 g/dL — ABNORMAL LOW (ref 6.5–8.1)

## 2018-10-24 SURGERY — COLECTOMY, PARTIAL
Anesthesia: General | Site: Abdomen

## 2018-10-24 MED ORDER — HYDROMORPHONE HCL 1 MG/ML IJ SOLN
INTRAMUSCULAR | Status: AC
  Administered 2018-10-24: 1 mg
  Filled 2018-10-24: qty 1

## 2018-10-24 MED ORDER — BUPIVACAINE LIPOSOME 1.3 % IJ SUSP
20.0000 mL | Freq: Once | INTRAMUSCULAR | Status: DC
  Filled 2018-10-24: qty 20

## 2018-10-24 MED ORDER — LACTATED RINGERS IV SOLN
INTRAVENOUS | Status: DC
  Administered 2018-10-24 (×2): via INTRAVENOUS

## 2018-10-24 MED ORDER — ROCURONIUM BROMIDE 50 MG/5ML IV SOSY
PREFILLED_SYRINGE | INTRAVENOUS | Status: AC
  Filled 2018-10-24: qty 5

## 2018-10-24 MED ORDER — BUPIVACAINE HCL 0.25 % IJ SOLN
INTRAMUSCULAR | Status: DC | PRN
  Administered 2018-10-24: 30 mL

## 2018-10-24 MED ORDER — DEXAMETHASONE SODIUM PHOSPHATE 10 MG/ML IJ SOLN
INTRAMUSCULAR | Status: AC
  Filled 2018-10-24: qty 1

## 2018-10-24 MED ORDER — PROPOFOL 10 MG/ML IV BOLUS
INTRAVENOUS | Status: AC
  Filled 2018-10-24: qty 40

## 2018-10-24 MED ORDER — PHENYLEPHRINE 40 MCG/ML (10ML) SYRINGE FOR IV PUSH (FOR BLOOD PRESSURE SUPPORT)
PREFILLED_SYRINGE | INTRAVENOUS | Status: DC | PRN
  Administered 2018-10-24 (×2): 80 ug via INTRAVENOUS

## 2018-10-24 MED ORDER — LIDOCAINE 2% (20 MG/ML) 5 ML SYRINGE
INTRAMUSCULAR | Status: AC
  Filled 2018-10-24: qty 5

## 2018-10-24 MED ORDER — BUPIVACAINE HCL (PF) 0.25 % IJ SOLN
INTRAMUSCULAR | Status: AC
  Filled 2018-10-24: qty 30

## 2018-10-24 MED ORDER — 0.9 % SODIUM CHLORIDE (POUR BTL) OPTIME
TOPICAL | Status: DC | PRN
  Administered 2018-10-24: 2000 mL

## 2018-10-24 MED ORDER — ALBUTEROL SULFATE (2.5 MG/3ML) 0.083% IN NEBU: 3.0000 mL | INHALATION_SOLUTION | RESPIRATORY_TRACT | Status: DC | PRN

## 2018-10-24 MED ORDER — MEPERIDINE HCL 50 MG/ML IJ SOLN: 6.2500 mg | INTRAMUSCULAR | Status: DC | PRN

## 2018-10-24 MED ORDER — ROCURONIUM BROMIDE 10 MG/ML (PF) SYRINGE
PREFILLED_SYRINGE | INTRAVENOUS | Status: DC | PRN
  Administered 2018-10-24: 50 mg via INTRAVENOUS
  Administered 2018-10-24: 30 mg via INTRAVENOUS

## 2018-10-24 MED ORDER — KETOROLAC TROMETHAMINE 15 MG/ML IJ SOLN
15.0000 mg | Freq: Four times a day (QID) | INTRAMUSCULAR | Status: DC | PRN
  Administered 2018-10-24 – 2018-10-28 (×6): 15 mg via INTRAVENOUS
  Filled 2018-10-24 (×7): qty 1

## 2018-10-24 MED ORDER — BUPIVACAINE LIPOSOME 1.3 % IJ SUSP
INTRAMUSCULAR | Status: DC | PRN
  Administered 2018-10-24: 20 mL

## 2018-10-24 MED ORDER — ACETAMINOPHEN 325 MG PO TABS
650.0000 mg | ORAL_TABLET | Freq: Four times a day (QID) | ORAL | Status: DC | PRN
  Administered 2018-10-26 – 2018-10-28 (×3): 650 mg via ORAL
  Filled 2018-10-24 (×3): qty 2

## 2018-10-24 MED ORDER — MIDAZOLAM HCL 2 MG/2ML IJ SOLN
INTRAMUSCULAR | Status: AC
  Filled 2018-10-24: qty 2

## 2018-10-24 MED ORDER — FENTANYL CITRATE (PF) 250 MCG/5ML IJ SOLN
INTRAMUSCULAR | Status: AC
  Filled 2018-10-24: qty 5

## 2018-10-24 MED ORDER — PROPOFOL 10 MG/ML IV BOLUS
INTRAVENOUS | Status: DC | PRN
  Administered 2018-10-24: 150 mg via INTRAVENOUS

## 2018-10-24 MED ORDER — ONDANSETRON HCL 4 MG/2ML IJ SOLN: 4.0000 mg | Freq: Four times a day (QID) | INTRAMUSCULAR | Status: DC | PRN

## 2018-10-24 MED ORDER — ACETAMINOPHEN 500 MG PO TABS
1000.0000 mg | ORAL_TABLET | ORAL | Status: AC
  Administered 2018-10-24: 1000 mg via ORAL
  Filled 2018-10-24: qty 2

## 2018-10-24 MED ORDER — MORPHINE SULFATE (PF) 2 MG/ML IV SOLN
2.0000 mg | INTRAVENOUS | Status: DC | PRN
  Administered 2018-10-24: 4 mg via INTRAVENOUS
  Filled 2018-10-24: qty 2

## 2018-10-24 MED ORDER — GABAPENTIN 300 MG PO CAPS
300.0000 mg | ORAL_CAPSULE | ORAL | Status: AC
  Administered 2018-10-24: 300 mg via ORAL
  Filled 2018-10-24: qty 1

## 2018-10-24 MED ORDER — ONDANSETRON HCL 4 MG/2ML IJ SOLN
INTRAMUSCULAR | Status: DC | PRN
  Administered 2018-10-24: 4 mg via INTRAVENOUS

## 2018-10-24 MED ORDER — ENOXAPARIN SODIUM 40 MG/0.4ML ~~LOC~~ SOLN
40.0000 mg | SUBCUTANEOUS | Status: DC
  Administered 2018-10-25 – 2018-10-29 (×5): 40 mg via SUBCUTANEOUS
  Filled 2018-10-24 (×4): qty 0.4

## 2018-10-24 MED ORDER — PHENYLEPHRINE 40 MCG/ML (10ML) SYRINGE FOR IV PUSH (FOR BLOOD PRESSURE SUPPORT)
PREFILLED_SYRINGE | INTRAVENOUS | Status: AC
  Filled 2018-10-24: qty 10

## 2018-10-24 MED ORDER — HYDROMORPHONE HCL 1 MG/ML IJ SOLN
0.5000 mg | INTRAMUSCULAR | Status: AC | PRN
  Administered 2018-10-24 (×2): 0.5 mg via INTRAVENOUS

## 2018-10-24 MED ORDER — DIPHENHYDRAMINE HCL 25 MG PO CAPS: 25.0000 mg | ORAL_CAPSULE | Freq: Four times a day (QID) | ORAL | Status: DC | PRN

## 2018-10-24 MED ORDER — OXYCODONE HCL 5 MG PO TABS
5.0000 mg | ORAL_TABLET | Freq: Four times a day (QID) | ORAL | Status: DC | PRN
  Administered 2018-10-24 – 2018-10-26 (×6): 5 mg via ORAL
  Filled 2018-10-24 (×5): qty 1

## 2018-10-24 MED ORDER — HYDROMORPHONE HCL 1 MG/ML IJ SOLN
0.2500 mg | INTRAMUSCULAR | Status: DC | PRN
  Administered 2018-10-24 (×4): 0.5 mg via INTRAVENOUS

## 2018-10-24 MED ORDER — GLYCOPYRROLATE PF 0.2 MG/ML IJ SOSY
PREFILLED_SYRINGE | INTRAMUSCULAR | Status: DC | PRN
  Administered 2018-10-24: .1 mg via INTRAVENOUS

## 2018-10-24 MED ORDER — GABAPENTIN 300 MG PO CAPS
300.0000 mg | ORAL_CAPSULE | Freq: Two times a day (BID) | ORAL | Status: DC
  Administered 2018-10-24 – 2018-10-29 (×10): 300 mg via ORAL
  Filled 2018-10-24 (×11): qty 1

## 2018-10-24 MED ORDER — ENSURE SURGERY PO LIQD
237.0000 mL | Freq: Two times a day (BID) | ORAL | Status: DC
  Administered 2018-10-24 – 2018-10-29 (×6): 237 mL via ORAL
  Filled 2018-10-24 (×12): qty 237

## 2018-10-24 MED ORDER — MIDAZOLAM HCL 5 MG/5ML IJ SOLN
INTRAMUSCULAR | Status: DC | PRN
  Administered 2018-10-24: 2 mg via INTRAVENOUS

## 2018-10-24 MED ORDER — ALVIMOPAN 12 MG PO CAPS
12.0000 mg | ORAL_CAPSULE | ORAL | Status: AC
  Administered 2018-10-24: 12 mg via ORAL
  Filled 2018-10-24: qty 1

## 2018-10-24 MED ORDER — LIDOCAINE 2% (20 MG/ML) 5 ML SYRINGE
INTRAMUSCULAR | Status: DC | PRN
  Administered 2018-10-24: 80 mg via INTRAVENOUS

## 2018-10-24 MED ORDER — CHLORHEXIDINE GLUCONATE 4 % EX LIQD: 60.0000 mL | Freq: Once | CUTANEOUS | Status: DC

## 2018-10-24 MED ORDER — ALBUTEROL SULFATE HFA 108 (90 BASE) MCG/ACT IN AERS
INHALATION_SPRAY | RESPIRATORY_TRACT | Status: AC
  Filled 2018-10-24: qty 6.7

## 2018-10-24 MED ORDER — SUGAMMADEX SODIUM 200 MG/2ML IV SOLN
INTRAVENOUS | Status: DC | PRN
  Administered 2018-10-24: 200 mg via INTRAVENOUS

## 2018-10-24 MED ORDER — ONDANSETRON HCL 4 MG PO TABS: 4.0000 mg | ORAL_TABLET | Freq: Four times a day (QID) | ORAL | Status: DC | PRN

## 2018-10-24 MED ORDER — METOCLOPRAMIDE HCL 5 MG/ML IJ SOLN: 10.0000 mg | Freq: Once | INTRAMUSCULAR | Status: DC | PRN

## 2018-10-24 MED ORDER — CEFOTETAN DISODIUM-DEXTROSE 2-2.08 GM-%(50ML) IV SOLR
2.0000 g | INTRAVENOUS | Status: AC
  Administered 2018-10-24: 2 g via INTRAVENOUS
  Filled 2018-10-24: qty 50

## 2018-10-24 MED ORDER — HYDROMORPHONE HCL 1 MG/ML IJ SOLN
1.0000 mg | INTRAMUSCULAR | Status: DC | PRN
  Administered 2018-10-24 – 2018-10-29 (×16): 1 mg via INTRAVENOUS
  Filled 2018-10-24 (×18): qty 1

## 2018-10-24 MED ORDER — METOPROLOL TARTRATE 5 MG/5ML IV SOLN
5.0000 mg | Freq: Four times a day (QID) | INTRAVENOUS | Status: DC | PRN
  Administered 2018-10-27 (×2): 5 mg via INTRAVENOUS

## 2018-10-24 MED ORDER — FLUTICASONE FUROATE-VILANTEROL 200-25 MCG/INH IN AEPB
1.0000 | INHALATION_SPRAY | Freq: Every day | RESPIRATORY_TRACT | Status: DC
  Administered 2018-10-26 – 2018-10-29 (×3): 1 via RESPIRATORY_TRACT
  Filled 2018-10-24: qty 28

## 2018-10-24 MED ORDER — PANTOPRAZOLE SODIUM 40 MG PO TBEC
40.0000 mg | DELAYED_RELEASE_TABLET | Freq: Every day | ORAL | Status: DC
  Administered 2018-10-25 – 2018-10-29 (×5): 40 mg via ORAL
  Filled 2018-10-24 (×5): qty 1

## 2018-10-24 MED ORDER — FENTANYL CITRATE (PF) 100 MCG/2ML IJ SOLN
INTRAMUSCULAR | Status: DC | PRN
  Administered 2018-10-24: 100 ug via INTRAVENOUS
  Administered 2018-10-24: 50 ug via INTRAVENOUS
  Administered 2018-10-24 (×2): 100 ug via INTRAVENOUS
  Administered 2018-10-24: 50 ug via INTRAVENOUS
  Administered 2018-10-24: 100 ug via INTRAVENOUS

## 2018-10-24 MED ORDER — SCOPOLAMINE 1 MG/3DAYS TD PT72
1.0000 | MEDICATED_PATCH | TRANSDERMAL | Status: DC
  Administered 2018-10-24: 1.5 mg via TRANSDERMAL
  Filled 2018-10-24: qty 1

## 2018-10-24 MED ORDER — ONDANSETRON HCL 4 MG/2ML IJ SOLN
INTRAMUSCULAR | Status: AC
  Filled 2018-10-24: qty 2

## 2018-10-24 MED ORDER — HEPARIN SODIUM (PORCINE) 5000 UNIT/ML IJ SOLN
5000.0000 [IU] | Freq: Once | INTRAMUSCULAR | Status: AC
  Administered 2018-10-24: 5000 [IU] via SUBCUTANEOUS
  Filled 2018-10-24: qty 1

## 2018-10-24 MED ORDER — ALBUTEROL SULFATE HFA 108 (90 BASE) MCG/ACT IN AERS
INHALATION_SPRAY | RESPIRATORY_TRACT | Status: DC | PRN
  Administered 2018-10-24 (×2): 5 via RESPIRATORY_TRACT

## 2018-10-24 MED ORDER — KETOROLAC TROMETHAMINE 15 MG/ML IJ SOLN
15.0000 mg | INTRAMUSCULAR | Status: AC
  Administered 2018-10-24: 15 mg via INTRAVENOUS
  Filled 2018-10-24: qty 1

## 2018-10-24 MED ORDER — DIPHENHYDRAMINE HCL 50 MG/ML IJ SOLN: 25.0000 mg | Freq: Four times a day (QID) | INTRAMUSCULAR | Status: DC | PRN

## 2018-10-24 MED ORDER — FLUTICASONE PROPIONATE 50 MCG/ACT NA SUSP
2.0000 | Freq: Every day | NASAL | Status: DC
  Administered 2018-10-26 – 2018-10-29 (×3): 2 via NASAL
  Filled 2018-10-24: qty 16

## 2018-10-24 MED ORDER — HYDROMORPHONE HCL 1 MG/ML IJ SOLN
INTRAMUSCULAR | Status: AC
  Filled 2018-10-24: qty 1

## 2018-10-24 MED ORDER — OXYCODONE HCL 5 MG PO TABS
ORAL_TABLET | ORAL | Status: AC
  Filled 2018-10-24: qty 1

## 2018-10-24 MED ORDER — METOPROLOL TARTRATE 5 MG/5ML IV SOLN
5.0000 mg | Freq: Four times a day (QID) | INTRAVENOUS | Status: DC
  Administered 2018-10-24 – 2018-10-29 (×18): 5 mg via INTRAVENOUS
  Filled 2018-10-24 (×19): qty 5

## 2018-10-24 MED ORDER — SODIUM CHLORIDE 0.45 % IV SOLN
INTRAVENOUS | Status: DC
  Administered 2018-10-24 – 2018-10-27 (×3): via INTRAVENOUS

## 2018-10-24 MED ORDER — DEXAMETHASONE SODIUM PHOSPHATE 10 MG/ML IJ SOLN
INTRAMUSCULAR | Status: DC | PRN
  Administered 2018-10-24: 10 mg via INTRAVENOUS

## 2018-10-24 MED ORDER — ALVIMOPAN 12 MG PO CAPS
12.0000 mg | ORAL_CAPSULE | Freq: Two times a day (BID) | ORAL | Status: DC
  Administered 2018-10-25 – 2018-10-26 (×4): 12 mg via ORAL
  Filled 2018-10-24 (×4): qty 1

## 2018-10-24 SURGICAL SUPPLY — 55 items
BIOPATCH WHT 1IN DISK W/4.0 H (GAUZE/BANDAGES/DRESSINGS) ×2 IMPLANT
BLADE CLIPPER SURG (BLADE) IMPLANT
CANISTER SUCT 3000ML PPV (MISCELLANEOUS) ×1 IMPLANT
COVER SURGICAL LIGHT HANDLE (MISCELLANEOUS) ×6 IMPLANT
COVER WAND RF STERILE (DRAPES) ×1 IMPLANT
DRAIN CHANNEL 19F RND (DRAIN) ×2 IMPLANT
DRSG OPSITE POSTOP 4X10 (GAUZE/BANDAGES/DRESSINGS) ×2 IMPLANT
DRSG OPSITE POSTOP 4X8 (GAUZE/BANDAGES/DRESSINGS) IMPLANT
DRSG TEGADERM 4X4.75 (GAUZE/BANDAGES/DRESSINGS) ×2 IMPLANT
ELECT CAUTERY BLADE 6.4 (BLADE) ×2 IMPLANT
ELECT REM PT RETURN 9FT ADLT (ELECTROSURGICAL) ×3
ELECTRODE REM PT RTRN 9FT ADLT (ELECTROSURGICAL) ×1 IMPLANT
EVACUATOR SILICONE 100CC (DRAIN) ×2 IMPLANT
GLOVE BIO SURGEON STRL SZ7 (GLOVE) ×4 IMPLANT
GLOVE BIOGEL PI IND STRL 7.0 (GLOVE) IMPLANT
GLOVE BIOGEL PI IND STRL 7.5 (GLOVE) ×2 IMPLANT
GLOVE BIOGEL PI IND STRL 8 (GLOVE) IMPLANT
GLOVE BIOGEL PI INDICATOR 7.0 (GLOVE) ×4
GLOVE BIOGEL PI INDICATOR 7.5 (GLOVE) ×8
GLOVE BIOGEL PI INDICATOR 8 (GLOVE) ×2
GLOVE ECLIPSE 7.5 STRL STRAW (GLOVE) ×4 IMPLANT
GLOVE ECLIPSE 8.0 STRL XLNG CF (GLOVE) ×4 IMPLANT
GLOVE SURG SS PI 7.0 STRL IVOR (GLOVE) ×6 IMPLANT
GOWN STRL REUS W/ TWL LRG LVL3 (GOWN DISPOSABLE) ×6 IMPLANT
GOWN STRL REUS W/TWL LRG LVL3 (GOWN DISPOSABLE) ×24
KIT TURNOVER KIT B (KITS) ×3 IMPLANT
LIGASURE IMPACT 36 18CM CVD LR (INSTRUMENTS) ×2 IMPLANT
NEEDLE 22X1 1/2 (OR ONLY) (NEEDLE) ×3 IMPLANT
NS IRRIG 1000ML POUR BTL (IV SOLUTION) ×6 IMPLANT
PACK COLON (CUSTOM PROCEDURE TRAY) ×3 IMPLANT
PAD ARMBOARD 7.5X6 YLW CONV (MISCELLANEOUS) ×3 IMPLANT
PENCIL BUTTON HOLSTER BLD 10FT (ELECTRODE) ×1 IMPLANT
SLEEVE SUCTION CATH 165 (SLEEVE) ×2 IMPLANT
SPONGE LAP 18X18 X RAY DECT (DISPOSABLE) ×4 IMPLANT
STAPLER CIRC CVD 29MM 37CM (STAPLE) ×2 IMPLANT
STAPLER CUT CVD 40MM BLUE (STAPLE) ×2 IMPLANT
STAPLER CUT RELOAD BLUE (STAPLE) ×4 IMPLANT
STAPLER VISISTAT 35W (STAPLE) ×3 IMPLANT
SURGILUBE 2OZ TUBE FLIPTOP (MISCELLANEOUS) ×2 IMPLANT
SUT ETHILON 2 0 FS 18 (SUTURE) ×2 IMPLANT
SUT PDS AB 0 CT 36 (SUTURE) ×6 IMPLANT
SUT PROLENE 0 CT 1 30 (SUTURE) ×1 IMPLANT
SUT PROLENE 2 0 CT2 30 (SUTURE) IMPLANT
SUT PROLENE 2 0 KS (SUTURE) ×2 IMPLANT
SUT SILK 2 0 SH CR/8 (SUTURE) ×3 IMPLANT
SUT SILK 2 0 TIES 10X30 (SUTURE) ×3 IMPLANT
SUT SILK 3 0 SH CR/8 (SUTURE) ×3 IMPLANT
SUT SILK 3 0 TIES 10X30 (SUTURE) ×3 IMPLANT
SUT VIC AB 0 CT1 27 (SUTURE)
SUT VIC AB 0 CT1 27XBRD ANBCTR (SUTURE) ×1 IMPLANT
SUT VIC AB 3-0 SH 18 (SUTURE) IMPLANT
TRAY FOLEY MTR SLVR 16FR STAT (SET/KITS/TRAYS/PACK) ×2 IMPLANT
TRAY PROCTOSCOPIC FIBER OPTIC (SET/KITS/TRAYS/PACK) ×2 IMPLANT
TUBE CONNECTING 12'X1/4 (SUCTIONS) ×2
TUBE CONNECTING 12X1/4 (SUCTIONS) ×4 IMPLANT

## 2018-10-24 NOTE — Op Note (Signed)
Preoperative diagnosis: diverticulitis, umbilical hernia  Postoperative diagnosis: same   Procedure: partial colectomy with colorectal anastomosis, splenic mobilization, appendectomy, umbilical hernia repair  Surgeon: Gurney Maxin, M.D.  Asst: Judyann Munson, RNFA  Anesthesia: general  Indications for procedure: Dana Kennedy is a 53 y.o. year old female with symptoms of recurrent abdominal pain and findings of diverticulitis. Due to recurrent flare up decision was made to proceed with colectomy with anastomosis.  Description of procedure: The patient was brought into the operative suite. Anesthesia was administered with General endotracheal anesthesia. WHO checklist was applied. The patient was then placed in lithotomy position. The area was prepped and draped in the usual sterile fashion.  Next, midline incision was made and cautery was used to dissect down to the subcutaneous tissues.  Fascia was entered in the midline.  There was a small mesh patch encountered in the mid upper abdomen that was divided.  The omentum was adhered to the abdominal wall in this region and cautery was used to dissect through the adhesive disease lasting less than 5 minutes.  Next, patient was placed in Trendelenburg position the sigmoid colon was freed with blunt dissection.  The sigmoid colon had moderate amount of inflammation but no abscess cavities were encountered.  Next, white line of Toldt on the left side was divided and a space was created moving proximally.  Next, retroperitoneum was expected and the ureter was identified.  On inspection of the sigmoid colon, the appendix was adhered to the sigmoid colon, so decision was made to excise the appendix.  A window was created at the base of the appendix and a contour blue load stapler was used to divide the base the appendix and then the LigaSure was used to divide the mesoappendix.  Then, the peritoneum of the mesentery of the sigmoid colon was  incised moving down towards the rectum this allowed visualization of the mesenteric vessels.    A healthy portion of colon along the distal left colon was identified and mesentery was divided and the hole was created and a blue load contour stapler was used to divide the colon in this area.  LigaSure was then used to divide the mesenteric vessels of the sigmoid colon moving down towards the rectum.  Splaying of tinea was identified as the rectosigmoid junction and a portion of healthy rectum was chosen for distal transection.  Once the mesentery was divided down to this area a blue load contour stapler was used to divide the rectosigmoid junction.  Sigmoid colon and appendix were then sent as specimen.  Next, I looked at the length of the left colon and.  That any additional length therefore I incised the remainder of the white line of Toldt moving up towards the spleen.  We then identified the transverse colon dividing the omentum away from the transverse colon moving towards the spleen did a full splenic flexure mobilization using cautery to divide attachments up to the spleen and retroperitoneum.  This allowed good mobility of the colon and it easily reached down into the pelvis.  The staple line of the left colon was then removed and pursestring device was put in place with a 2-0 Prolene.  The colon was then incised and the superior that a 29 mm stapler would easily fit into the area.  So a 29 mm anvil was placed and the pursestring was secured around it.  Next, I ensured that there was good mobility of the rectum and no strictures or twisting.  I went  below and introduced the stapler up to the rectum and performed end-to-end anastomosis with the EEA 29 mm stapler.  I then did a proctoscopy which showed no leak and the anastomosis was found at around 16cm.  We then did a full clean change out including gown gloves and drapes and instruments.  I then placed a 48 [place] Blake drain through the left lower  quadrant placing the tip into the pelvis along the anastomosis.  I sutured it in with a 2-0 nylon.  Next, the midline fascia was closed with a 0 PDS in running fashion.  Along the area of the umbilicus there was a small umbilical hernia which was reduced and then incised such that the 0 PDS closed the hernia.  The subcutaneous tissues were irrigated and then the skin was closed with interrupted staples.  All counts were correct.  [name] was put in place.  Patient awoke from anesthesia was brought back in stable condition.  Findings: inflamed colon with adherent appendix, negative leak test at 16cm  Specimen: sigmoid colon with appendix, stitch marks proximal  Implant: 19 fr blake LLQ with tip resting in pelvis   Blood loss: 139ml  Local anesthesia: 50 ml Exparel:Marcaine Mix  Complications: none  Gurney Maxin, M.D. General, Bariatric, & Minimally Invasive Surgery [facility]

## 2018-10-24 NOTE — H&P (Signed)
Dana Kennedy is an 53 y.o. female.   Chief Complaint: diverticulitis HPI: 53 yo female with recurrent diverticulitis. She has been hospitalized for this as well as multiple rounds of antibiotics but continues to have discomfort in the area and flare ups.  Past Medical History:  Diagnosis Date  . Anemia   . Asthma   . Chest pain    atypcial chest pain evaluation 07/10/18 Philbert Riser, Coletta Memos, MD), echo order (NL LVEF, wall motion 09/2018)  . Chronic lower back pain   . Diverticulitis   . GERD (gastroesophageal reflux disease)   . History of blood transfusion    "HgB low w/lupus flareup" (08/19/2018)  . History of ITP 2014   NOT PROBLEM NOW  . Rheumatoid arthritis ([place])    "legs; all my bones" (08/19/2018)  . SLE (systemic lupus erythematosus) ([place])   . Umbilical hernia     Past Surgical History:  Procedure Laterality Date  . HERNIA REPAIR    . stomach ulcer surgery  2008  . TONSILLECTOMY    . TOTAL HIP ARTHROPLASTY  06/14/2012   Procedure: TOTAL HIP ARTHROPLASTY ANTERIOR APPROACH;  Surgeon: Mcarthur Rossetti, MD;  Location: [facility];  Service: Orthopedics;  Laterality: Left;  Left Total Hip Arthroplasty, Anterior Approach C-Arm  . TUBAL LIGATION    . UMBILICAL HERNIA REPAIR      Family History  Problem Relation Age of Onset  . Cancer Mother   . Hypertension Mother    Social History:  reports that she quit smoking about 7 weeks ago. Her smoking use included cigarettes. She has a 72.50 pack-year smoking history. She has never used smokeless tobacco. She reports current alcohol use. She reports that she does not use drugs.  Allergies:  Allergies  Allergen Reactions  . Aspirin     Causes bleeding  . Eszopiclone Swelling    Felt like throat was closing. Not sure if it was related to this medication or not. lunesta  . Allegra [Fexofenadine] Cough    Pt states she began having spells of dry cough after taking this medication  . Restoril [Temazepam] Other (See  Comments)    Headaches     Medications Prior to Admission  Medication Sig Dispense Refill  . acetaminophen (TYLENOL) 325 MG tablet Take 2 tablets (650 mg total) by mouth every 6 (six) hours as needed for mild pain (or Fever >/= 101). 12 tablet 0  . albuterol (PROVENTIL HFA;VENTOLIN HFA) 108 (90 Base) MCG/ACT inhaler Inhale 2 puffs into the lungs every 4 (four) hours as needed for wheezing or shortness of breath. 1 Inhaler 2  . budesonide-formoterol (SYMBICORT) 160-4.5 MCG/ACT inhaler Inhale 2 puffs into the lungs 2 (two) times daily. 1 Inhaler 6  . chlorpheniramine (CHLOR-TRIMETON) 4 MG tablet Take 2 tablets (8 mg total) by mouth 3 (three) times daily. 190 tablet 0  . fluticasone (FLONASE) 50 MCG/ACT nasal spray Place 2 sprays into both nostrils daily. 16 g 2  . fluticasone furoate-vilanterol (BREO ELLIPTA) 200-25 MCG/INH AEPB Inhale 1 puff into the lungs daily. 60 each 5  . lactobacillus (FLORANEX/LACTINEX) PACK Take 1 packet (1 g total) by mouth 3 (three) times daily with meals. (Patient not taking: Reported on 10/02/2018) 30 packet 0  . Nicotine 21-14-7 MG/24HR KIT Take as directed (Patient not taking: Reported on 10/23/2018) 56 each 0  . olopatadine (PATANOL) 0.1 % ophthalmic solution Place 1 drop into both eyes 2 (two) times daily as needed for allergies.    Marland Kitchen ondansetron (ZOFRAN) 4 MG  tablet Take 1 tablet (4 mg total) by mouth every 6 (six) hours as needed for nausea. 12 tablet 0  . oxyCODONE-acetaminophen (PERCOCET/ROXICET) 5-325 MG tablet Take 1 tablet by mouth every 4 (four) hours as needed for severe pain. 10 tablet 0  . oxyCODONE-acetaminophen (PERCOCET/ROXICET) 5-325 MG tablet Take 1 tablet by mouth every 4 (four) hours as needed for severe pain. (Patient not taking: Reported on 09/09/2018) 15 tablet 0  . pantoprazole (PROTONIX) 40 MG tablet Take 1 tablet (40 mg total) by mouth daily. 30 tablet 1  . sucralfate (CARAFATE) 1 g tablet Take 1 tablet (1 g total) by mouth 4 (four) times daily.  (Patient not taking: Reported on 10/02/2018) 30 tablet 0    Results for orders placed or performed during the hospital encounter of 10/23/18 (from the past 48 hour(s))  CBC     Status: Abnormal   Collection Time: 10/23/18  9:42 AM  Result Value Ref Range   WBC 5.8 4.0 - 10.5 K/uL   RBC 4.71 3.87 - 5.11 MIL/uL   Hemoglobin 12.1 12.0 - 15.0 g/dL   HCT 38.2 36.0 - 46.0 %   MCV 81.1 80.0 - 100.0 fL   MCH 25.7 (L) 26.0 - 34.0 pg   MCHC 31.7 30.0 - 36.0 g/dL   RDW 16.1 (H) 11.5 - 15.5 %   Platelets 371 150 - 400 K/uL   nRBC 0.0 0.0 - 0.2 %    Comment: Performed at [facility], [address] [place], [place] [address]  Basic metabolic panel     Status: Abnormal   Collection Time: 10/23/18  9:42 AM  Result Value Ref Range   Sodium 143 135 - 145 mmol/L   Potassium 3.5 3.5 - 5.1 mmol/L   Chloride 109 98 - 111 mmol/L   CO2 23 22 - 32 mmol/L   Glucose, Bld 112 (H) 70 - 99 mg/dL   BUN 7 6 - 20 mg/dL   Creatinine, Ser 0.64 0.44 - 1.00 mg/dL   Calcium 9.0 8.9 - 10.3 mg/dL   GFR calc non Af Amer >60 >60 mL/min   GFR calc Af Amer >60 >60 mL/min   Anion gap 11 5 - 15    Comment: Performed at [facility], [place] [address], [place], [place] [address]  Lactic acid, plasma     Status: Abnormal   Collection Time: 10/23/18  9:42 AM  Result Value Ref Range   Lactic Acid, Venous 2.0 (HH) 0.5 - 1.9 mmol/L    Comment: CRITICAL RESULT CALLED TO, READ BACK BY AND VERIFIED WITH: B.MICHELSON,RN 1039 10/23/2018 CLARK,S Performed at [facility], [place] [address] [place], [place] [address]    No results found.  Review of Systems  Constitutional: Negative for chills and fever.  HENT: Negative for hearing loss.   Eyes: Negative for blurred vision and double vision.  Respiratory: Negative for cough and hemoptysis.   Cardiovascular: Negative for chest pain and palpitations.  Gastrointestinal: Negative for abdominal pain, nausea and vomiting.  Genitourinary: Negative for dysuria and  urgency.  Musculoskeletal: Negative for myalgias and neck pain.  Skin: Negative for itching and rash.  Neurological: Negative for dizziness, tingling and headaches.  Endo/Heme/Allergies: Does not bruise/bleed easily.  Psychiatric/Behavioral: Negative for depression and suicidal ideas.    Last menstrual period 02/03/2018. Physical Exam  Vitals reviewed. Constitutional: She is oriented to person, place, and time. She appears well-developed and well-nourished.  HENT:  Head: Normocephalic and atraumatic.  Eyes: Pupils  are equal, round, and reactive to light. Conjunctivae and EOM are normal.  Neck: Normal range of motion. Neck supple.  Cardiovascular: Normal rate and regular rhythm.  Respiratory: Effort normal and breath sounds normal.  GI: Soft. Bowel sounds are normal. She exhibits no distension. There is no abdominal tenderness.  Midline hernia  Musculoskeletal: Normal range of motion.  Neurological: She is alert and oriented to person, place, and time.  Skin: Skin is warm and dry.  Psychiatric: She has a normal mood and affect. Her behavior is normal.     Assessment/Plan 53 yo female with recurrent diverticulitis -open sigmoid colectomy with colorectal anastomosis -inpatient admission -ERAS protocol  Mickeal Skinner, MD 10/24/2018, 6:49 AM

## 2018-10-24 NOTE — Transfer of Care (Signed)
Immediate Anesthesia Transfer of Care Note  Patient: Dana Kennedy  Procedure(s) Performed: PARTIAL COLON RESECTION WITH ANASTOMOSIS; APPENDECTOMY  ERAS PATHWAY SPLENIC FLEXURE MOBILIZATION; UMBILICAL HERNIA REPAIR (N/A Abdomen)  Patient Location: PACU  Anesthesia Type:General  Level of Consciousness: awake, patient cooperative and responds to stimulation  Airway & Oxygen Therapy: Patient Spontanous Breathing and Patient connected to nasal cannula oxygen  Post-op Assessment: Report given to RN and Post -op Vital signs reviewed and stable  Post vital signs: Reviewed and stable  Last Vitals:  Vitals Value Taken Time  BP 141/96 10/24/2018 11:11 AM  Temp    Pulse 72 10/24/2018 11:14 AM  Resp 17 10/24/2018 11:14 AM  SpO2 99 % 10/24/2018 11:14 AM  Vitals shown include unvalidated device data.  Last Pain:  Vitals:   10/24/18 0729  TempSrc:   PainSc: 9       Patients Stated Pain Goal: 3 (40/35/24 8185)  Complications: No apparent anesthesia complications

## 2018-10-24 NOTE — Anesthesia Postprocedure Evaluation (Signed)
Anesthesia Post Note  Patient: Lanina A Fields-Baugham  Procedure(s) Performed: PARTIAL COLON RESECTION WITH ANASTOMOSIS; APPENDECTOMY  ERAS PATHWAY SPLENIC FLEXURE MOBILIZATION; UMBILICAL HERNIA REPAIR (N/A Abdomen)     Patient location during evaluation: PACU Anesthesia Type: General Level of consciousness: awake and alert Pain management: pain level controlled Vital Signs Assessment: post-procedure vital signs reviewed and stable Respiratory status: spontaneous breathing, nonlabored ventilation, respiratory function stable and patient connected to nasal cannula oxygen Cardiovascular status: blood pressure returned to baseline and stable Postop Assessment: no apparent nausea or vomiting Anesthetic complications: no    Last Vitals:  Vitals:   10/24/18 1507 10/24/18 1525  BP: (!) 157/108 (!) 155/96  Pulse: 79   Resp: (!) 24   Temp: 36.9 C   SpO2: 99%     Last Pain:  Vitals:   10/24/18 1625  TempSrc:   PainSc: Asleep                 Lawerance Matsuo A.

## 2018-10-25 ENCOUNTER — Encounter (HOSPITAL_COMMUNITY): Payer: Self-pay | Admitting: General Surgery

## 2018-10-25 LAB — CBC
HCT: 35.1 % — ABNORMAL LOW (ref 36.0–46.0)
Hemoglobin: 11 g/dL — ABNORMAL LOW (ref 12.0–15.0)
MCH: 25.1 pg — ABNORMAL LOW (ref 26.0–34.0)
MCHC: 31.3 g/dL (ref 30.0–36.0)
MCV: 80 fL (ref 80.0–100.0)
Platelets: 373 10*3/uL (ref 150–400)
RBC: 4.39 MIL/uL (ref 3.87–5.11)
RDW: 15.8 % — ABNORMAL HIGH (ref 11.5–15.5)
WBC: 10.7 10*3/uL — AB (ref 4.0–10.5)
nRBC: 0 % (ref 0.0–0.2)

## 2018-10-25 LAB — BASIC METABOLIC PANEL
Anion gap: 14 (ref 5–15)
BUN: 8 mg/dL (ref 6–20)
CO2: 21 mmol/L — ABNORMAL LOW (ref 22–32)
Calcium: 8.8 mg/dL — ABNORMAL LOW (ref 8.9–10.3)
Chloride: 101 mmol/L (ref 98–111)
Creatinine, Ser: 0.8 mg/dL (ref 0.44–1.00)
GFR calc Af Amer: >60 mL/min (ref >60)
GFR calc non Af Amer: >60 mL/min (ref >60)
Glucose, Bld: 132 mg/dL — ABNORMAL HIGH (ref 70–99)
Potassium: 3.7 mmol/L (ref 3.5–5.1)
Sodium: 136 mmol/L (ref 135–145)

## 2018-10-26 MED ORDER — OXYCODONE-ACETAMINOPHEN 5-325 MG PO TABS
1.0000 | ORAL_TABLET | ORAL | Status: DC | PRN
  Administered 2018-10-27 (×3): 2 via ORAL
  Administered 2018-10-27: 1 via ORAL
  Administered 2018-10-27 – 2018-10-29 (×7): 2 via ORAL
  Filled 2018-10-26 (×12): qty 2

## 2018-10-26 NOTE — Progress Notes (Signed)
Patient ID: Dana Kennedy, female   DOB: 11/18/1965, 53 y.o.   MRN: [id number] 2 Days Post-Op   Subjective: Up walking in halls.  No complaints.  Denies nausea or pain.  Tolerating full liquids.  No flatus or bowel movement yet.  Objective: Vital signs in last 24 hours: Temp:  [97.8 F (36.6 C)-98.5 F (36.9 C)] 98.3 F (36.8 C) (02/08 0413) Pulse Rate:  [79-97] 79 (02/08 0413) Resp:  [17-24] 18 (02/08 0413) BP: (99-134)/(70-80) 115/80 (02/08 0413) SpO2:  [94 %-96 %] 96 % (02/08 0413) Weight:  [95.8 kg] 95.8 kg (02/08 0413) Last BM Date: 10/23/18  Intake/Output from previous day: 02/07 0701 - 02/08 0700 In: 1860.8 [P.O.:590; I.V.:1270.8] Out: 135 [Drains:135] Intake/Output this shift: No intake/output data recorded.  General appearance: alert, cooperative and no distress GI: Mild distention.  Appropriate peri-incisional tenderness.  JP drainage serosanguineous. Incision/Wound: Small amount of old bloody drainage.  No erythema.  Lab Results:  Recent Labs    10/23/18 0942 10/25/18 0515  WBC 5.7  5.8 10.7*  HGB 11.9*  12.1 11.0*  HCT 38.4  38.2 35.1*  PLT 378  371 373   BMET Recent Labs    10/23/18 0942 10/25/18 0515  NA 143 136  K 3.5 3.7  CL 109 101  CO2 23 21*  GLUCOSE 112* 132*  BUN 7 8  CREATININE 0.64 0.80  CALCIUM 9.0 8.8*     Studies/Results: No results found.  Anti-infectives: Anti-infectives (From admission, onward)   Start     Dose/Rate Route Frequency Ordered Stop   10/24/18 0700  cefoTEtan in Dextrose 5% (CEFOTAN) IVPB 2 g     2 g 100 mL/hr over 30 Minutes Intravenous On call to O.R. 10/24/18 0651 10/24/18 0845      Assessment/Plan: s/p Procedure(s): PARTIAL COLON RESECTION WITH ANASTOMOSIS; APPENDECTOMY  ERAS PATHWAY SPLENIC FLEXURE MOBILIZATION; UMBILICAL HERNIA REPAIR Stable with expected early postop ileus.  Continue full liquid diet.  Ambulating well.   LOS: 2 days    Edward Jolly 10/26/2018

## 2018-10-26 NOTE — Progress Notes (Signed)
S: pain improved this morning, tolerating liquids, no nausea, no flatus or BM O: BP 115/80 (BP Location: Left Arm)   Pulse 79   Temp 98.3 F (36.8 C) (Oral)   Resp 18   Ht 5\' 4"  (1.626 m)   Wt 95.8 kg   LMP 02/03/2018   SpO2 96%   BMI 36.25 kg/m  Gen: NAD Neuro: AOx4 Abd: soft, appropriately tender, incision c/d/i no erythema  A/P 52 yo female POD 1 sigmoidectomy -Advance to full liquids -continue multimodal pain control -continue ambulation -await return of bowel function

## 2018-10-27 NOTE — Progress Notes (Signed)
Patient ID: Dana Kennedy, female   DOB: 1965-09-21, 53 y.o.   MRN: [id number] 3 Days Post-Op   Subjective: Had increased generalized abdominal pain yesterday and last night but again feels better this morning.  Had one bowel movement with old blood.  Denies nausea.  Objective: Vital signs in last 24 hours: Temp:  [97.9 F (36.6 C)-99.1 F (37.3 C)] 99.1 F (37.3 C) (02/09 0525) Pulse Rate:  [77-84] 78 (02/09 0525) Resp:  [18-20] 18 (02/09 0525) BP: (120-127)/(70-82) 126/81 (02/09 0525) SpO2:  [92 %-98 %] 98 % (02/09 0525) Last BM Date: 10/23/18  Intake/Output from previous day: 02/08 0701 - 02/09 0700 In: 2168.4 [P.O.:1034; I.V.:1134.4] Out: 168 [Urine:3; Drains:165] Intake/Output this shift: No intake/output data recorded.  General appearance: alert, cooperative and no distress GI: Nondistended.  Mild appropriate tenderness. Incision/Wound: No erythema.  Slight old bloody drainage.  Lab Results:  Recent Labs    10/25/18 0515  WBC 10.7*  HGB 11.0*  HCT 35.1*  PLT 373   BMET Recent Labs    10/25/18 0515  NA 136  K 3.7  CL 101  CO2 21*  GLUCOSE 132*  BUN 8  CREATININE 0.80  CALCIUM 8.8*     Studies/Results: No results found.  Anti-infectives: Anti-infectives (From admission, onward)   Start     Dose/Rate Route Frequency Ordered Stop   10/24/18 0700  cefoTEtan in Dextrose 5% (CEFOTAN) IVPB 2 g     2 g 100 mL/hr over 30 Minutes Intravenous On call to O.R. 10/24/18 0651 10/24/18 0845      Assessment/Plan: s/p Procedure(s): PARTIAL COLON RESECTION WITH ANASTOMOSIS; APPENDECTOMY  ERAS PATHWAY SPLENIC FLEXURE MOBILIZATION; UMBILICAL HERNIA REPAIR Doing well.  She is hungry and we will advance to full liquid diet.  Continue progressive ambulation. Possible discharge tomorrow morning.   LOS: 3 days    Edward Jolly 10/27/2018

## 2018-10-28 NOTE — Progress Notes (Signed)
  Progress Note: General Surgery Service   Assessment/Plan: Active Problems:   Diverticulitis large intestine  s/p Procedure(s): PARTIAL COLON RESECTION WITH ANASTOMOSIS; APPENDECTOMY  ERAS PATHWAY SPLENIC FLEXURE MOBILIZATION; UMBILICAL HERNIA REPAIR 04/23/7618 -tolerating diet +BM today not bloody -remove drain -continue pain control and ambulation    LOS: 4 days  Chief Complaint/Subjective: +BM today, tolerating diet, some left sided pain  Objective: Vital signs in last 24 hours: Temp:  [98.5 F (36.9 C)-98.8 F (37.1 C)] 98.5 F (36.9 C) (02/10 5093) Pulse Rate:  [80-88] 81 (02/10 0613) Resp:  [18-20] 20 (02/10 0613) BP: (132-138)/(81-88) 136/81 (02/10 0613) SpO2:  [97 %-100 %] 100 % (02/10 0919) Last BM Date: 10/23/18  Intake/Output from previous day: 02/09 0701 - 02/10 0700 In: 2565 [P.O.:1200; I.V.:1365] Out: 120 [Drains:120] Intake/Output this shift: Total I/O In: 100 [P.O.:100] Out: -   Lungs: nonlabored breathing  Cardiovascular: RRR  Abd: soft, ATTP, incision c/d/i  Extremities: no edema  Neuro: AOx4  Lab Results: CBC  No results for input(s): WBC, HGB, HCT, PLT in the last 72 hours. BMET No results for input(s): NA, K, CL, CO2, GLUCOSE, BUN, CREATININE, CALCIUM in the last 72 hours. PT/INR No results for input(s): LABPROT, INR in the last 72 hours. ABG No results for input(s): PHART, HCO3 in the last 72 hours.  Invalid input(s): PCO2, PO2  Studies/Results:  Anti-infectives: Anti-infectives (From admission, onward)   Start     Dose/Rate Route Frequency Ordered Stop   10/24/18 0700  cefoTEtan in Dextrose 5% (CEFOTAN) IVPB 2 g     2 g 100 mL/hr over 30 Minutes Intravenous On call to O.R. 10/24/18 2671 10/24/18 0845      Medications: Scheduled Meds: . enoxaparin (LOVENOX) injection  40 mg Subcutaneous Q24H  . feeding supplement  237 mL Oral BID BM  . fluticasone  2 spray Each Nare Daily  . fluticasone furoate-vilanterol  1 puff  Inhalation Daily  . gabapentin  300 mg Oral BID  . metoprolol tartrate  5 mg Intravenous Q6H  . pantoprazole  40 mg Oral Daily   Continuous Infusions: . sodium chloride 50 mL/hr at 10/27/18 2028  . lactated ringers     PRN Meds:.acetaminophen, albuterol, diphenhydrAMINE **OR** diphenhydrAMINE, HYDROmorphone (DILAUDID) injection, ketorolac, metoprolol tartrate, ondansetron **OR** ondansetron (ZOFRAN) IV, oxyCODONE-acetaminophen  Mickeal Skinner, MD [facility]

## 2018-10-29 MED ORDER — OXYCODONE HCL 5 MG PO TABS: 5.0000 mg | ORAL_TABLET | Freq: Four times a day (QID) | ORAL | 0 refills | Status: DC | PRN

## 2018-10-29 MED ORDER — IBUPROFEN 800 MG PO TABS: 800.0000 mg | ORAL_TABLET | Freq: Three times a day (TID) | ORAL | 0 refills | Status: DC | PRN

## 2018-10-29 NOTE — Discharge Summary (Signed)
Physician Discharge Summary  Patient ID: Dana Kennedy MRN: [id number] DOB/AGE: 26-Sep-1965 53 y.o.  Admit date: 10/24/2018 Discharge date: 10/29/2018  Admission Diagnoses:  Discharge Diagnoses:  Active Problems:   Diverticulitis large intestine   Discharged Condition: good  Hospital Course: 53 yo female with recurrent diveriticulitis. She presented to the hospital for colon resection. Post operatively she was admitted to the surgical floor. She was slowly advanced on a diet over the next 3 days. She has multiple bloody bowel movements and then regular bowel movements. She was discharged home POD 5.  Consults: None  Significant Diagnostic Studies: labs:  CBC    Component Value Date/Time   WBC 10.7 (H) 10/25/2018 0515   RBC 4.39 10/25/2018 0515   HGB 11.0 (L) 10/25/2018 0515   HGB 12.6 09/09/2018 0838   HGB 13.3 05/01/2016 0946   HCT 35.1 (L) 10/25/2018 0515   HCT 40.3 05/01/2016 0946   PLT 373 10/25/2018 0515   PLT 396 09/09/2018 0838   PLT 331 05/01/2016 0946   MCV 80.0 10/25/2018 0515   MCV 82.6 05/01/2016 0946   MCH 25.1 (L) 10/25/2018 0515   MCHC 31.3 10/25/2018 0515   RDW 15.8 (H) 10/25/2018 0515   RDW 14.5 05/01/2016 0946   LYMPHSABS 2.4 10/23/2018 0942   LYMPHSABS 2.2 05/01/2016 0946   MONOABS 0.5 10/23/2018 0942   MONOABS 0.7 05/01/2016 0946   EOSABS 0.2 10/23/2018 0942   EOSABS 0.1 05/01/2016 0946   BASOSABS 0.1 10/23/2018 0942   BASOSABS 0.0 05/01/2016 0946     Treatments: IV hydration and surgery: partial colectomy  Discharge Exam: Blood pressure (!) 144/85, pulse 77, temperature 99 F (37.2 C), temperature source Oral, resp. rate 17, height _0  (1.626 m), weight 95.8 kg, last menstrual period 02/03/2018, SpO2 97 %. General appearance: alert and cooperative Head: Normocephalic, without obvious abnormality, atraumatic Resp: clear to auscultation bilaterally Cardio: regular rate and rhythm, S1, S2 normal, no murmur, click, rub or gallop GI:  soft, non-tender; bowel sounds normal; no masses,  no organomegaly and incison c/d/i  Disposition: Discharge disposition: 01-Home or Self Care       Discharge Instructions    Call MD for:  persistant dizziness or light-headedness   Complete by:  As directed    Call MD for:  persistant nausea and vomiting   Complete by:  As directed    Call MD for:  redness, tenderness, or signs of infection (pain, swelling, redness, odor or green/yellow discharge around incision site)   Complete by:  As directed    Call MD for:  severe uncontrolled pain   Complete by:  As directed    Call MD for:  temperature >100.4   Complete by:  As directed    Diet - low sodium heart healthy   Complete by:  As directed    Increase activity slowly   Complete by:  As directed      Allergies as of 10/29/2018      Reactions   Aspirin    Causes bleeding   Eszopiclone Swelling   Felt like throat was closing. Not sure if it was related to this medication or not. lunesta   Allegra [fexofenadine] Cough   Pt states she began having spells of dry cough after taking this medication   Restoril [temazepam] Other (See Comments)   Headaches       Medication List    STOP taking these medications   ondansetron 4 MG tablet Commonly known as:  ZOFRAN  oxyCODONE-acetaminophen 5-325 MG tablet Commonly known as:  PERCOCET/ROXICET   sucralfate 1 g tablet Commonly known as:  CARAFATE     TAKE these medications   acetaminophen 325 MG tablet Commonly known as:  TYLENOL Take 2 tablets (650 mg total) by mouth every 6 (six) hours as needed for mild pain (or Fever >/= 101).   albuterol 108 (90 Base) MCG/ACT inhaler Commonly known as:  PROVENTIL HFA;VENTOLIN HFA Inhale 2 puffs into the lungs every 4 (four) hours as needed for wheezing or shortness of breath.   budesonide-formoterol 160-4.5 MCG/ACT inhaler Commonly known as:  SYMBICORT Inhale 2 puffs into the lungs 2 (two) times daily.   chlorpheniramine 4 MG  tablet Commonly known as:  CHLOR-TRIMETON Take 2 tablets (8 mg total) by mouth 3 (three) times daily.   fluticasone 50 MCG/ACT nasal spray Commonly known as:  FLONASE Place 2 sprays into both nostrils daily.   fluticasone furoate-vilanterol 200-25 MCG/INH Aepb Commonly known as:  BREO ELLIPTA Inhale 1 puff into the lungs daily.   ibuprofen 800 MG tablet Commonly known as:  ADVIL,MOTRIN Take 1 tablet (800 mg total) by mouth every 8 (eight) hours as needed.   lactobacillus Pack Take 1 packet (1 g total) by mouth 3 (three) times daily with meals.   Nicotine 21-14-7 MG/24HR Kit Take as directed   olopatadine 0.1 % ophthalmic solution Commonly known as:  PATANOL Place 1 drop into both eyes 2 (two) times daily as needed for allergies.   oxyCODONE 5 MG immediate release tablet Commonly known as:  Oxy IR/ROXICODONE Take 1 tablet (5 mg total) by mouth every 6 (six) hours as needed for severe pain.   pantoprazole 40 MG tablet Commonly known as:  PROTONIX Take 1 tablet (40 mg total) by mouth daily.        Signed: Arta Bruce Darus Hershman 10/29/2018, 1:13 PM

## 2018-12-24 ENCOUNTER — Telehealth: Payer: Self-pay | Admitting: *Deleted

## 2018-12-24 NOTE — Telephone Encounter (Signed)
Attempted to reach patient to convert new pt appt to video. Only number was mobile and voice MB full. Called Desmon, listed as spouse and gave him office number for her to call back. He verbalized understanding.

## 2018-12-30 NOTE — Telephone Encounter (Signed)
Called patient who stated she had no capability to do video appt. She asked to reschedule. I gave her options in early June, and she asked what was she supposed to do about her pain until June. Then she stated her daughter could probably help her with a video appt, and she stated she would call her and then call me back.

## 2018-12-30 NOTE — Telephone Encounter (Signed)
Pt called in and stated she doesn't want a video visit she needs to physically see someone

## 2019-01-01 NOTE — Telephone Encounter (Signed)
Called patient and asked if her daughter can help her with video visit. She stated she is seeing her daughter today and will call back before we close today. She  verbalized understanding, appreciation.

## 2019-01-06 ENCOUNTER — Emergency Department (HOSPITAL_COMMUNITY): Payer: Medicaid Other

## 2019-01-06 ENCOUNTER — Encounter (HOSPITAL_COMMUNITY): Payer: Self-pay | Admitting: Emergency Medicine

## 2019-01-06 ENCOUNTER — Other Ambulatory Visit: Payer: Self-pay

## 2019-01-06 ENCOUNTER — Emergency Department (HOSPITAL_COMMUNITY)
Admission: EM | Admit: 2019-01-06 | Discharge: 2019-01-06 | Disposition: A | Discharge status: Home / Self Care | Payer: Medicaid Other | Attending: Emergency Medicine | Admitting: Emergency Medicine

## 2019-01-06 DIAGNOSIS — G8918 Other acute postprocedural pain: Secondary | ICD-10-CM | POA: Insufficient documentation

## 2019-01-06 DIAGNOSIS — Z79899 Other long term (current) drug therapy: Secondary | ICD-10-CM | POA: Insufficient documentation

## 2019-01-06 DIAGNOSIS — Z96642 Presence of left artificial hip joint: Secondary | ICD-10-CM | POA: Insufficient documentation

## 2019-01-06 DIAGNOSIS — J45909 Unspecified asthma, uncomplicated: Secondary | ICD-10-CM | POA: Insufficient documentation

## 2019-01-06 DIAGNOSIS — R109 Unspecified abdominal pain: Secondary | ICD-10-CM

## 2019-01-06 DIAGNOSIS — Z87891 Personal history of nicotine dependence: Secondary | ICD-10-CM | POA: Diagnosis not present

## 2019-01-06 DIAGNOSIS — R1012 Left upper quadrant pain: Secondary | ICD-10-CM | POA: Insufficient documentation

## 2019-01-06 LAB — COMPREHENSIVE METABOLIC PANEL
ALT: 15 U/L (ref 0–44)
AST: 21 U/L (ref 15–41)
Albumin: 3.7 g/dL (ref 3.5–5.0)
Alkaline Phosphatase: 90 U/L (ref 38–126)
Anion gap: 8 (ref 5–15)
BUN: 10 mg/dL (ref 6–20)
CO2: 24 mmol/L (ref 22–32)
Calcium: 9.6 mg/dL (ref 8.9–10.3)
Chloride: 106 mmol/L (ref 98–111)
Creatinine, Ser: 0.91 mg/dL (ref 0.44–1.00)
GFR calc Af Amer: >60 mL/min (ref >60)
GFR calc non Af Amer: >60 mL/min (ref >60)
Glucose, Bld: 110 mg/dL — ABNORMAL HIGH (ref 70–99)
Potassium: 3.9 mmol/L (ref 3.5–5.1)
Sodium: 138 mmol/L (ref 135–145)
Total Bilirubin: 0.4 mg/dL (ref 0.3–1.2)
Total Protein: 6.5 g/dL (ref 6.5–8.1)

## 2019-01-06 LAB — CBC
HCT: 41.1 % (ref 36.0–46.0)
Hemoglobin: 12.8 g/dL (ref 12.0–15.0)
MCH: 25 pg — ABNORMAL LOW (ref 26.0–34.0)
MCHC: 31.1 g/dL (ref 30.0–36.0)
MCV: 80.1 fL (ref 80.0–100.0)
Platelets: 416 10*3/uL — ABNORMAL HIGH (ref 150–400)
RBC: 5.13 MIL/uL — ABNORMAL HIGH (ref 3.87–5.11)
RDW: 14.5 % (ref 11.5–15.5)
WBC: 6.3 10*3/uL (ref 4.0–10.5)
nRBC: 0 % (ref 0.0–0.2)

## 2019-01-06 LAB — LIPASE, BLOOD: Lipase: 95 U/L — ABNORMAL HIGH (ref 11–51)

## 2019-01-06 MED ORDER — SODIUM CHLORIDE 0.9% FLUSH: 3.0000 mL | Freq: Once | INTRAVENOUS | Status: DC

## 2019-01-06 MED ORDER — OXYCODONE-ACETAMINOPHEN 5-325 MG PO TABS: 1.0000 | ORAL_TABLET | Freq: Four times a day (QID) | ORAL | 0 refills | Status: DC | PRN

## 2019-01-06 MED ORDER — HYDROMORPHONE HCL 1 MG/ML IJ SOLN
0.5000 mg | Freq: Once | INTRAMUSCULAR | Status: AC
  Administered 2019-01-06: 0.5 mg via INTRAVENOUS
  Filled 2019-01-06: qty 1

## 2019-01-06 MED ORDER — IOHEXOL 300 MG/ML  SOLN
100.0000 mL | Freq: Once | INTRAMUSCULAR | Status: AC | PRN
  Administered 2019-01-06: 100 mL via INTRAVENOUS

## 2019-01-06 NOTE — ED Provider Notes (Signed)
[facility] Provider Note   CSN: [id number] Arrival date & time: 01/06/19  1338    History   Chief Complaint Chief Complaint  Patient presents with  . Post-op Problem    HPI Dana Kennedy is a 53 y.o. female.     HPI   53 year old female presents today with complaints of abdominal pain.  She most recently was discharged from the hospital on 10/29/2018 status post colon resection with anastomosis and appendectomy.  He notes that she developed pain to her right mid abdomen and left upper abdomen 4 days ago.  She denies any nausea vomiting or fever.  She denies any diarrhea or constipation is passing gas.  She reports pain worse with movement and palpation.  She notes that just lateral right of her vertical incision she has a bulging when she stands, not present when laying back.  Past Medical History:  Diagnosis Date  . Anemia   . Asthma   . Chest pain    atypcial chest pain evaluation 07/10/18 Philbert Riser, Coletta Memos, MD), echo order (NL LVEF, wall motion 09/2018)  . Chronic lower back pain   . Diverticulitis   . GERD (gastroesophageal reflux disease)   . History of blood transfusion    "HgB low w/lupus flareup" (08/19/2018)  . History of ITP 2014   NOT PROBLEM NOW  . Rheumatoid arthritis ([place])    "legs; all my bones" (08/19/2018)  . SLE (systemic lupus erythematosus) ([place])   . Umbilical hernia     Patient Active Problem List   Diagnosis Date Noted  . Diverticulitis large intestine 10/24/2018  . Acute diverticulitis 08/18/2018  . Vaginal spotting 08/18/2018  . GERD (gastroesophageal reflux disease)   . Asthma   . 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 Continuous tobacco abuse 05/02/2016  . 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 Status post tonsillectomy 05/02/2016  . Dehydration 10/12/2013  . Idiopathic thrombocytopenic purpura ([place]) 09/05/2012  . Lupus (systemic lupus erythematosus) ([place])  09/05/2012  . 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 HERNIA REPAIR    . PARTIAL COLECTOMY N/A 10/24/2018   Procedure: PARTIAL COLON RESECTION WITH ANASTOMOSIS; APPENDECTOMY  ERAS PATHWAY SPLENIC FLEXURE MOBILIZATION; UMBILICAL HERNIA REPAIR;  Surgeon: Kinsinger, Arta Bruce, MD;  Location: [place];  Service: General;  Laterality: N/A;  . stomach ulcer surgery  2008  . TONSILLECTOMY    . TOTAL HIP ARTHROPLASTY  06/14/2012   Procedure: TOTAL HIP ARTHROPLASTY ANTERIOR APPROACH;  Surgeon: Mcarthur Rossetti, MD;  Location: [facility];  Service: Orthopedics;  Laterality: Left;  Left Total Hip Arthroplasty, Anterior Approach C-Arm  . TUBAL LIGATION    . UMBILICAL HERNIA REPAIR       OB History   No obstetric history on file.      Home Medications    Prior to Admission medications   Medication Sig Start Date End Date Taking? Authorizing Provider  acetaminophen (TYLENOL) 325 MG tablet Take 2 tablets (650 mg total) by mouth every 6 (six) hours as needed for mild pain (or Fever >/= 101). 08/26/18   Roxan Hockey, MD  albuterol (PROVENTIL HFA;VENTOLIN HFA) 108 (90 Base) MCG/ACT inhaler Inhale 2 puffs into the lungs every 4 (four) hours as needed for wheezing or shortness of breath. 04/14/17   Pollina, Gwenyth Allegra, MD  budesonide-formoterol (SYMBICORT) 160-4.5 MCG/ACT inhaler Inhale 2 puffs into the lungs 2 (two) times daily. 11/12/17  Mannam, Praveen, MD  chlorpheniramine (CHLOR-TRIMETON) 4 MG tablet Take 2 tablets (8 mg total) by mouth 3 (three) times daily. 12/25/17   Mannam, Hart Robinsons, MD  fluticasone (FLONASE) 50 MCG/ACT nasal spray Place 2 sprays into both nostrils daily. 11/12/17   Mannam, Hart Robinsons, MD  fluticasone furoate-vilanterol (BREO ELLIPTA) 200-25 MCG/INH AEPB Inhale 1 puff into the lungs daily. 06/10/18   Mannam, Hart Robinsons, MD  ibuprofen (ADVIL,MOTRIN) 800 MG tablet Take 1 tablet (800 mg total) by mouth every 8 (eight) hours as  needed. 10/29/18   Kinsinger, Arta Bruce, MD  lactobacillus (FLORANEX/LACTINEX) PACK Take 1 packet (1 g total) by mouth 3 (three) times daily with meals. Patient not taking: Reported on 10/02/2018 08/26/18   Roxan Hockey, MD  Nicotine 21-14-7 MG/24HR KIT Take as directed Patient not taking: Reported on 10/23/2018 03/05/18   Marshell Garfinkel, MD  olopatadine (PATANOL) 0.1 % ophthalmic solution Place 1 drop into both eyes 2 (two) times daily as needed for allergies.    [provider]  oxyCODONE (OXY IR/ROXICODONE) 5 MG immediate release tablet Take 1 tablet (5 mg total) by mouth every 6 (six) hours as needed for severe pain. 10/29/18   Kinsinger, Arta Bruce, MD  pantoprazole (PROTONIX) 40 MG tablet Take 1 tablet (40 mg total) by mouth daily. 08/26/18   Roxan Hockey, MD    Family History Family History  Problem Relation Age of Onset  . Cancer Mother   . Hypertension Mother     Social History Social History   Tobacco Use  . Smoking status: Former Smoker    Packs/day: 2.50    Years: 29.00    Pack years: 72.50    Types: Cigarettes    Last attempt to quit: 09/01/2018    Years since quitting: 0.3  . Smokeless tobacco: Never Used  Substance Use Topics  . Alcohol use: Yes    Comment: 08/19/2018 "glass of wine a couple times/year"  . Drug use: No     Allergies   Aspirin; Eszopiclone; Allegra [fexofenadine]; and Restoril [temazepam]   Review of Systems Review of Systems  All other systems reviewed and are negative.    Physical Exam Updated Vital Signs BP 123/79 (BP Location: Right Arm)   Pulse 99   Temp 99.6 F (37.6 C) (Oral)   Resp 18   Ht 5' 5" (1.651 m)   Wt 91.6 kg   LMP 02/03/2018   SpO2 97%   BMI 33.61 kg/m   Physical Exam Vitals signs and nursing note reviewed.  Constitutional:      Appearance: She is well-developed.  HENT:     Head: Normocephalic and atraumatic.  Eyes:     General: No scleral icterus.       Right eye: No discharge.        Left  eye: No discharge.     Conjunctiva/sclera: Conjunctivae normal.     Pupils: Pupils are equal, round, and reactive to light.  Neck:     Musculoskeletal: Normal range of motion.     Vascular: No JVD.     Trachea: No tracheal deviation.  Pulmonary:     Effort: Pulmonary effort is normal.     Breath sounds: No stridor.  Abdominal:       Comments: Ventral incision scar, no erythema, exquisite tenderness to palpation of the right mid abdomen and left upper abdomen-all sounds normal, no obvious bowel loops on exam- difficult exam secondary to discomfort  Neurological:     Mental Status: She is alert and oriented  to person, place, and time.     Coordination: Coordination normal.  Psychiatric:        Behavior: Behavior normal.        Thought Content: Thought content normal.        Judgment: Judgment normal.      ED Treatments / Results  Labs (all labs ordered are listed, but only abnormal results are displayed) Labs Reviewed  LIPASE, BLOOD - Abnormal; Notable for the following components:      Result Value   Lipase 95 (*)    All other components within normal limits  COMPREHENSIVE METABOLIC PANEL - Abnormal; Notable for the following components:   Glucose, Bld 110 (*)    All other components within normal limits  CBC - Abnormal; Notable for the following components:   RBC 5.13 (*)    MCH 25.0 (*)    Platelets 416 (*)    All other components within normal limits    EKG None  Radiology No results found.  Procedures Procedures (including critical care time)  Medications Ordered in ED Medications  sodium chloride flush (NS) 0.9 % injection 3 mL (0 mLs Intravenous Hold 01/06/19 1449)  HYDROmorphone (DILAUDID) injection 0.5 mg (has no administration in time range)     Initial Impression / Assessment and Plan / ED Course  I have reviewed the triage vital signs and the nursing notes.  Pertinent labs & imaging results that were available during my care of the patient were  reviewed by me and considered in my medical decision making (see chart for details).         Labs: CBC, CMP, lipase  Imaging: CT abdomen pelvis with contrast  Consults:  Therapeutics: Dilaudid  Discharge Meds:   Assessment/Plan: 53 year old female presents today with abdominal pain.  Patient is status post colonic resection.  She reports history of hernia.  She has no obvious bowel loops presently but is exquisitely tender.  Patient will need CT scan here.  Patient given pain medication, CT pending.  Patient care signed to oncoming provider pending CT results and disposition.  She does not peer to be toxically ill presently, no signs of acute bowel obstruction.      Final Clinical Impressions(s) / ED Diagnoses   Final diagnoses:  Abdominal pain, unspecified abdominal location    ED Discharge Orders    None       Okey Regal, PA-C 01/06/19 1507    Veryl Speak, MD 01/06/19 1820

## 2019-01-06 NOTE — ED Triage Notes (Signed)
Pt arrives to ED for increase in abd pain after having major abd surgery on 2/05. Pt states she feels like their is a knot in her LLQ.

## 2019-01-06 NOTE — ED Notes (Signed)
Pt to CT at this time.

## 2019-01-06 NOTE — Discharge Instructions (Addendum)
Return here as needed.  Follow-up with your surgeon. °

## 2019-01-08 NOTE — Telephone Encounter (Signed)
Called patient to ask about video visit. She stated she downloaded zoom on her phone. I advised her we typically use Webex site, however she was reluctant to try to download webex. We rescheduled her appt for June, and I advised will call her back if we can help her with zoom meeting. She verbalized understanding, appreciation.

## 2019-01-10 ENCOUNTER — Ambulatory Visit: Payer: Medicaid Other | Admitting: Diagnostic Neuroimaging

## 2019-01-20 ENCOUNTER — Encounter: Payer: Self-pay | Admitting: *Deleted

## 2019-01-20 NOTE — Addendum Note (Signed)
Addended by: Minna Antis on: 01/20/2019 04:38 PM   Modules accepted: Orders

## 2019-01-20 NOTE — Telephone Encounter (Signed)
Spoke with patient and updated EMR for video visit tomorrow.

## 2019-01-21 ENCOUNTER — Encounter: Payer: Self-pay | Admitting: Diagnostic Neuroimaging

## 2019-01-21 ENCOUNTER — Ambulatory Visit (INDEPENDENT_AMBULATORY_CARE_PROVIDER_SITE_OTHER): Payer: Medicaid Other | Admitting: Diagnostic Neuroimaging

## 2019-01-21 ENCOUNTER — Telehealth: Payer: Self-pay | Admitting: Diagnostic Neuroimaging

## 2019-01-21 ENCOUNTER — Other Ambulatory Visit: Payer: Self-pay

## 2019-01-21 DIAGNOSIS — R200 Anesthesia of skin: Secondary | ICD-10-CM | POA: Diagnosis not present

## 2019-01-21 NOTE — Telephone Encounter (Signed)
Medicaid order sent to [facility]. They will obtain the auth and reach out to the pt to schedule.  °

## 2019-01-21 NOTE — Progress Notes (Signed)
[facility]  PATIENT: Dana Kennedy DOB: 03-04-1966  REFERRING CLINICIAN: Kinsinger, L HISTORY FROM: patient  REASON FOR VISIT: new consult    HISTORICAL  CHIEF COMPLAINT:  Chief Complaint  Patient presents with   Numbness    HISTORY OF PRESENT ILLNESS:   53 year old female here for evaluation of left arm numbness.  Patient had diverticulitis, status post colon resection surgery in February 2020.  In addition patient had appendectomy and umbilical hernia repair.  Immediately postoperatively, patient felt numbness and pain in her left arm.  This has continued without relief.  Patient initially felt numbness and heaviness in her left shoulder.  This progressed to numbness and tingling in her fingertips.  She feels weakness in her left arm.  She has some left neck pain.  Her left leg also feels swollen and heavy.  She has a tingling in the bottom of her left foot.  She feels a fullness in her left face.   REVIEW OF SYSTEMS: Full 14 system review of systems performed and negative with exception of: As per HPI.  ALLERGIES: Allergies  Allergen Reactions   Aspirin     Causes bleeding   Eszopiclone Swelling    Felt like throat was closing. Not sure if it was related to this medication or not. lunesta   Gabapentin Swelling    Ankles swelling   Allegra [Fexofenadine] Cough    Pt states she began having spells of dry cough after taking this medication   Restoril [Temazepam] Other (See Comments)    Headaches     HOME MEDICATIONS: Outpatient Medications Prior to Visit  Medication Sig Dispense Refill   acetaminophen (TYLENOL) 325 MG tablet Take 2 tablets (650 mg total) by mouth every 6 (six) hours as needed for mild pain (or Fever >/= 101). 12 tablet 0   albuterol (PROVENTIL HFA;VENTOLIN HFA) 108 (90 Base) MCG/ACT inhaler Inhale 2 puffs into the lungs every 4 (four) hours as needed for wheezing or shortness of breath. 1 Inhaler 2    budesonide-formoterol (SYMBICORT) 160-4.5 MCG/ACT inhaler Inhale 2 puffs into the lungs 2 (two) times daily. 1 Inhaler 6   chlorpheniramine (CHLOR-TRIMETON) 4 MG tablet Take 2 tablets (8 mg total) by mouth 3 (three) times daily. (Patient not taking: Reported on 01/20/2019) 190 tablet 0   fluticasone (FLONASE) 50 MCG/ACT nasal spray Place 2 sprays into both nostrils daily. 16 g 2   fluticasone furoate-vilanterol (BREO ELLIPTA) 200-25 MCG/INH AEPB Inhale 1 puff into the lungs daily. 60 each 5   ibuprofen (ADVIL,MOTRIN) 800 MG tablet Take 1 tablet (800 mg total) by mouth every 8 (eight) hours as needed. (Patient not taking: Reported on 01/20/2019) 30 tablet 0   Nicotine 21-14-7 MG/24HR KIT Take as directed (Patient not taking: Reported on 10/23/2018) 56 each 0   olopatadine (PATANOL) 0.1 % ophthalmic solution Place 1 drop into both eyes 2 (two) times daily as needed for allergies.     oxyCODONE-acetaminophen (PERCOCET/ROXICET) 5-325 MG tablet Take 1 tablet by mouth every 6 (six) hours as needed for severe pain. 20 tablet 0   pantoprazole (PROTONIX) 40 MG tablet Take 1 tablet (40 mg total) by mouth daily. 30 tablet 1   No facility-administered medications prior to visit.     PAST MEDICAL HISTORY: Past Medical History:  Diagnosis Date   Anemia    Asthma    Chest pain    atypcial chest pain evaluation 07/10/18 Philbert Riser, Coletta Memos, MD), echo order (NL LVEF, wall motion 09/2018)   Chronic  lower back pain    Diverticulitis    GERD (gastroesophageal reflux disease)    History of blood transfusion    "HgB low w/lupus flareup" (08/19/2018)   History of ITP 2014   NOT PROBLEM NOW   Rheumatoid arthritis ([place])    "legs; all my bones" (08/19/2018)   SLE (systemic lupus erythematosus) ([place])    Umbilical hernia     PAST SURGICAL HISTORY: Past Surgical History:  Procedure Laterality Date   HERNIA REPAIR     PARTIAL COLECTOMY N/A 10/24/2018   Procedure: PARTIAL COLON RESECTION WITH  ANASTOMOSIS; APPENDECTOMY  ERAS PATHWAY SPLENIC FLEXURE MOBILIZATION; UMBILICAL HERNIA REPAIR;  Surgeon: Kinsinger, Arta Bruce, MD;  Location: [place];  Service: General;  Laterality: N/A;   stomach ulcer surgery  2008   TONSILLECTOMY     TOTAL HIP ARTHROPLASTY  06/14/2012   Procedure: TOTAL HIP ARTHROPLASTY ANTERIOR APPROACH;  Surgeon: Mcarthur Rossetti, MD;  Location: [facility];  Service: Orthopedics;  Laterality: Left;  Left Total Hip Arthroplasty, Anterior Approach C-Arm   TUBAL LIGATION     UMBILICAL HERNIA REPAIR      FAMILY HISTORY: Family History  Problem Relation Age of Onset   Cancer Mother    Hypertension Mother    Heart attack Mother    Liver disease Father    Hypertension Brother     SOCIAL HISTORY: Social History   Socioeconomic History   Marital status: Married    Spouse name: Not on file   Number of children: 5   Years of education: Not on file   Highest education level: Some college, no degree  Occupational History    Comment: disability  Scientist, product/process development strain: Not on file   Food insecurity:    Worry: Not on file    Inability: Not on file   Transportation needs:    Medical: Not on file    Non-medical: Not on file  Tobacco Use   Smoking status: Former Smoker    Packs/day: 2.50    Years: 29.00    Pack years: 72.50    Types: Cigarettes    Last attempt to quit: 09/01/2018    Years since quitting: 0.3   Smokeless tobacco: Never Used  Substance and Sexual Activity   Alcohol use: Yes    Comment: 08/19/2018 "glass of wine a couple times/year"   Drug use: No   Sexual activity: Yes  Lifestyle   Physical activity:    Days per week: Not on file    Minutes per session: Not on file   Stress: Not on file  Relationships   Social connections:    Talks on phone: Not on file    Gets together: Not on file    Attends religious service: Not on file    Active member of club or organization: Not on file    Attends  meetings of clubs or organizations: Not on file    Relationship status: Not on file   Intimate partner violence:    Fear of current or ex partner: Not on file    Emotionally abused: Not on file    Physically abused: Not on file    Forced sexual activity: Not on file  Other Topics Concern   Not on file  Social History Narrative   Lives with spouse   Caffeine- none     PHYSICAL EXAM   VIDEO EXAM  GENERAL EXAM/CONSTITUTIONAL:  Vitals: There were no vitals filed for this visit.  There is no  height or weight on file to calculate BMI. Wt Readings from Last 3 Encounters:  01/06/19 202 lb (91.6 kg)  10/26/18 211 lb 3.2 oz (95.8 kg)  10/23/18 209 lb (94.8 kg)     Patient is in no distress; well developed, nourished and groomed; neck is supple   NEUROLOGIC: MENTAL STATUS:  No flowsheet data found.  awake, alert, oriented to person, place and time  recent and remote memory intact  normal attention and concentration  language fluent, comprehension intact, naming intact  fund of knowledge appropriate  CRANIAL NERVE:   2nd, 3rd, 4th, 6th - visual fields full to confrontation, extraocular muscles intact, no nystagmus  5th - facial sensation symmetric  7th - facial strength symmetric  8th - hearing intact  11th - shoulder shrug symmetric  12th - tongue protrusion midline  MOTOR:   NO TREMOR; NO DRIFT IN BUE  SLOW MOVEMENTS IN LEFT HAND; DIFFICULT TO RAISE LEFT ARM OVERHEAD COMPLETELY  SENSORY:   DECR IN LEFT HAND FINGERTIPS  COORDINATION:   fine finger movements SLOW ON LEFT HAND   FOOT TAP SLOW ON LEFT FOOT    DIAGNOSTIC DATA (LABS, IMAGING, TESTING) - I reviewed patient records, labs, notes, testing and imaging myself where available.  Lab Results  Component Value Date   WBC 6.3 01/06/2019   HGB 12.8 01/06/2019   HCT 41.1 01/06/2019   MCV 80.1 01/06/2019   PLT 416 (H) 01/06/2019      Component Value Date/Time   NA 138 01/06/2019 1400     NA 142 05/08/2013 0850   K 3.9 01/06/2019 1400   K 4.3 05/08/2013 0850   CL 106 01/06/2019 1400   CL 108 (H) 10/16/2012 1325   CO2 24 01/06/2019 1400   CO2 21 (L) 05/08/2013 0850   GLUCOSE 110 (H) 01/06/2019 1400   GLUCOSE 96 05/08/2013 0850   GLUCOSE 109 (H) 10/16/2012 1325   BUN 10 01/06/2019 1400   BUN 15.0 05/08/2013 0850   CREATININE 0.91 01/06/2019 1400   CREATININE 0.84 09/09/2018 0838   CREATININE 0.8 05/08/2013 0850   CALCIUM 9.6 01/06/2019 1400   CALCIUM 9.0 05/08/2013 0850   PROT 6.5 01/06/2019 1400   PROT 7.0 05/08/2013 0850   ALBUMIN 3.7 01/06/2019 1400   ALBUMIN 3.5 05/08/2013 0850   AST 21 01/06/2019 1400   AST 11 (L) 09/09/2018 0838   AST 12 05/08/2013 0850   ALT 15 01/06/2019 1400   ALT 11 09/09/2018 0838   ALT 11 05/08/2013 0850   ALKPHOS 90 01/06/2019 1400   ALKPHOS 71 05/08/2013 0850   BILITOT 0.4 01/06/2019 1400   BILITOT <0.2 (L) 09/09/2018 0838   BILITOT 0.28 05/08/2013 0850   GFRNONAA >60 01/06/2019 1400   GFRNONAA >60 09/09/2018 0838   GFRAA >60 01/06/2019 1400   GFRAA >60 09/09/2018 0838   No results found for: CHOL, HDL, LDLCALC, LDLDIRECT, TRIG, CHOLHDL Lab Results  Component Value Date   HGBA1C 6.4 (H) 10/02/2018   Lab Results  Component Value Date   [id number] 846 08/27/2010   Lab Results  Component Value Date   TSH 0.579 08/26/2010       ASSESSMENT AND PLAN  53 y.o. year old female here with left arm heaviness and numbness, postoperatively from surgery in February 2020 (colon resection, appendectomy, hernia repair).  Patient also has some tingling in her left foot and a "fullness" sensation in her left face.  Could represent perioperative stroke versus cervical radiculopathy versus peripheral neuropathy.  Perioperative neuropathies  can occur in setting of prolonged surgeries and anatomic variations patients, usually gradually improved over 3 to 27-monthtimeframe, sometimes longer.  Dx:  1. Numbness     Virtual Visit via  Video Note  I connected with Dana Kennedy on 01/21/19 at  1:30 PM EDT by a video enabled telemedicine application and verified that I am speaking with the correct person using two identifiers.  Location: Patient: home  Provider: office   I discussed the limitations of evaluation and management by telemedicine and the availability of in person appointments. The patient expressed understanding and agreed to proceed.   I discussed the assessment and treatment plan with the patient. The patient was provided an opportunity to ask questions and all were answered. The patient agreed with the plan and demonstrated an understanding of the instructions.   The patient was advised to call back or seek an in-person evaluation if the symptoms worsen or if the condition fails to improve as anticipated.  I provided 30 minutes of non-face-to-face time during this encounter.   PLAN:  PERI-OPERATIVE LEFT ARM PAIN, NUMBNESS, WEAKNESS (also left face fullness; left foot tingling) - check MRI brain (rule out stroke) - check MRI cervical spine (eval for radiculopathy) - then may consider EMG/NCS in future  Orders Placed This Encounter  Procedures   MR BRAIN WO CONTRAST   MR [place]  Return pending test results, for pending if symptoms worsen or fail to improve.    VPenni Bombard MD 59/03/9535 29:22PM Certified in Neurology, Neurophysiology and Neuroimaging  [facility] [address] [place] [phone]

## 2019-02-03 NOTE — Telephone Encounter (Signed)
Proceed with MRI brain to start. Then I may consider MRI cervical spine in future. -VRP

## 2019-02-03 NOTE — Telephone Encounter (Signed)
Dana Kennedy with [facility] informed me that Medicaid approved the MRI brain but not the MRI Cervical  Based on [employer] Guidelines Section: SP 3.1 Neck (Cervical Spine) Pain without and with Neurological Features (Including Stenosis), we cannot approve this request. Your records show that you may have a problem with your neck. The reason this request cannot be approved is because: 1. MRI might be supported in the evaluation of suspected or known spinal disease with one of the following. -Failure to improve after a recent (within three months) six week trial of physician-guided clinical care with clinical re-evaluation. -Any signs or symptoms such as significant motor weakness, malignancy, infection, cauda equina syndrome, for which conservative treatment is not needed. The clinical information received fails to support meeting these requirements and, therefore, the request is not indicated at this time.    There is an option to do a peer to peer the phone number is [phone] the case number is [id number]. It would have to be scheduled by Friday 02/07/19.  Right now she is scheduled for tomorrow at [facility]. Do you want her to proceed on having the MRI Brain or reschedule for a later day?

## 2019-02-03 NOTE — Telephone Encounter (Signed)
Noted I will let Noelia aware.

## 2019-02-04 ENCOUNTER — Other Ambulatory Visit: Payer: Medicaid Other

## 2019-02-08 ENCOUNTER — Ambulatory Visit
Admission: RE | Admit: 2019-02-08 | Discharge: 2019-02-08 | Disposition: A | Discharge status: Home / Self Care | Payer: Medicaid Other | Source: Ambulatory Visit | Attending: Diagnostic Neuroimaging | Admitting: Diagnostic Neuroimaging

## 2019-02-08 ENCOUNTER — Other Ambulatory Visit: Payer: Self-pay

## 2019-02-08 DIAGNOSIS — R200 Anesthesia of skin: Secondary | ICD-10-CM

## 2019-02-11 ENCOUNTER — Telehealth: Payer: Self-pay | Admitting: *Deleted

## 2019-02-11 DIAGNOSIS — R29898 Other symptoms and signs involving the musculoskeletal system: Secondary | ICD-10-CM

## 2019-02-11 NOTE — Telephone Encounter (Signed)
Called patient re: MRI results. No answer and voice MB full.

## 2019-02-11 NOTE — Telephone Encounter (Signed)
Will check EMG. -VRP

## 2019-02-11 NOTE — Telephone Encounter (Signed)
Received call back from patient. I advised her the MRI brain result was unremarkable. Her insurance didn't approve MRI cervical spine. She would like to have EMG/NCS to evaluate her left hand issues. I advised will send her request to Dr Leta Baptist. If he approves she'll get a call form Elmyra Ricks to schedule, and advised Dr Leta Baptist does NCS on Thurs afternoons. She  verbalized understanding, appreciation.

## 2019-02-11 NOTE — Addendum Note (Signed)
Addended by: Andrey Spearman R on: 02/11/2019 04:07 PM   Modules accepted: Orders

## 2019-02-18 ENCOUNTER — Emergency Department (HOSPITAL_COMMUNITY)
Admission: EM | Admit: 2019-02-18 | Discharge: 2019-02-18 | Disposition: A | Discharge status: Home / Self Care | Payer: Medicaid Other | Attending: Emergency Medicine | Admitting: Emergency Medicine

## 2019-02-18 ENCOUNTER — Encounter (HOSPITAL_COMMUNITY): Payer: Self-pay | Admitting: *Deleted

## 2019-02-18 ENCOUNTER — Emergency Department (HOSPITAL_COMMUNITY): Payer: Medicaid Other

## 2019-02-18 ENCOUNTER — Other Ambulatory Visit: Payer: Self-pay

## 2019-02-18 DIAGNOSIS — R1030 Lower abdominal pain, unspecified: Secondary | ICD-10-CM | POA: Insufficient documentation

## 2019-02-18 DIAGNOSIS — R109 Unspecified abdominal pain: Secondary | ICD-10-CM

## 2019-02-18 DIAGNOSIS — J45909 Unspecified asthma, uncomplicated: Secondary | ICD-10-CM | POA: Insufficient documentation

## 2019-02-18 DIAGNOSIS — Z87891 Personal history of nicotine dependence: Secondary | ICD-10-CM | POA: Diagnosis not present

## 2019-02-18 DIAGNOSIS — Z79899 Other long term (current) drug therapy: Secondary | ICD-10-CM | POA: Insufficient documentation

## 2019-02-18 DIAGNOSIS — R1084 Generalized abdominal pain: Secondary | ICD-10-CM

## 2019-02-18 DIAGNOSIS — R1033 Periumbilical pain: Secondary | ICD-10-CM | POA: Insufficient documentation

## 2019-02-18 LAB — COMPREHENSIVE METABOLIC PANEL
ALT: 13 U/L (ref 0–44)
AST: 19 U/L (ref 15–41)
Albumin: 4.2 g/dL (ref 3.5–5.0)
Alkaline Phosphatase: 107 U/L (ref 38–126)
Anion gap: 15 (ref 5–15)
BUN: 10 mg/dL (ref 6–20)
CO2: 24 mmol/L (ref 22–32)
Calcium: 10 mg/dL (ref 8.9–10.3)
Chloride: 100 mmol/L (ref 98–111)
Creatinine, Ser: 0.78 mg/dL (ref 0.44–1.00)
GFR calc Af Amer: >60 mL/min (ref >60)
GFR calc non Af Amer: >60 mL/min (ref >60)
Glucose, Bld: 111 mg/dL — ABNORMAL HIGH (ref 70–99)
Potassium: 4 mmol/L (ref 3.5–5.1)
Sodium: 139 mmol/L (ref 135–145)
Total Bilirubin: 0.6 mg/dL (ref 0.3–1.2)
Total Protein: 7.5 g/dL (ref 6.5–8.1)

## 2019-02-18 LAB — CBC
HCT: 43.4 % (ref 36.0–46.0)
Hemoglobin: 14.1 g/dL (ref 12.0–15.0)
MCH: 25.5 pg — ABNORMAL LOW (ref 26.0–34.0)
MCHC: 32.5 g/dL (ref 30.0–36.0)
MCV: 78.5 fL — ABNORMAL LOW (ref 80.0–100.0)
Platelets: 390 10*3/uL (ref 150–400)
RBC: 5.53 MIL/uL — ABNORMAL HIGH (ref 3.87–5.11)
RDW: 15.5 % (ref 11.5–15.5)
WBC: 9.3 10*3/uL (ref 4.0–10.5)
nRBC: 0 % (ref 0.0–0.2)

## 2019-02-18 LAB — URINALYSIS, ROUTINE W REFLEX MICROSCOPIC
Bilirubin Urine: NEGATIVE
Glucose, UA: NEGATIVE mg/dL
Ketones, ur: NEGATIVE mg/dL
Nitrite: NEGATIVE
Protein, ur: NEGATIVE mg/dL
Specific Gravity, Urine: >1.046 — ABNORMAL HIGH (ref 1.005–1.030)
pH: 6 (ref 5.0–8.0)

## 2019-02-18 LAB — LIPASE, BLOOD: Lipase: 33 U/L (ref 11–51)

## 2019-02-18 MED ORDER — MORPHINE SULFATE (PF) 4 MG/ML IV SOLN
4.0000 mg | Freq: Once | INTRAVENOUS | Status: AC
  Administered 2019-02-18: 4 mg via INTRAVENOUS
  Filled 2019-02-18: qty 1

## 2019-02-18 MED ORDER — IOPAMIDOL (ISOVUE-300) INJECTION 61%
100.0000 mL | Freq: Once | INTRAVENOUS | Status: AC | PRN
  Administered 2019-02-18: 100 mL via INTRAVENOUS

## 2019-02-18 MED ORDER — SODIUM CHLORIDE 0.9 % IV BOLUS
1000.0000 mL | Freq: Once | INTRAVENOUS | Status: AC
  Administered 2019-02-18: 1000 mL via INTRAVENOUS

## 2019-02-18 MED ORDER — SODIUM CHLORIDE 0.9% FLUSH
3.0000 mL | Freq: Once | INTRAVENOUS | Status: AC
  Administered 2019-02-18: 3 mL via INTRAVENOUS

## 2019-02-18 MED ORDER — DICYCLOMINE HCL 20 MG PO TABS: 20.0000 mg | ORAL_TABLET | Freq: Three times a day (TID) | ORAL | 0 refills | Status: DC | PRN

## 2019-02-18 MED ORDER — ONDANSETRON HCL 4 MG/2ML IJ SOLN
4.0000 mg | Freq: Once | INTRAMUSCULAR | Status: AC
  Administered 2019-02-18: 4 mg via INTRAVENOUS
  Filled 2019-02-18: qty 2

## 2019-02-18 NOTE — ED Notes (Addendum)
Lab state urine culture container in lab. Will send requesaition

## 2019-02-18 NOTE — ED Triage Notes (Signed)
Onset of pain and knot just to the right of the umbilical. Pain "twisting, tight" radiates all through the right sided of the abdomen. LBM yesterday, has had 4 episodes of vomiting. No fevers. Hx of abdominal surgery in February (partial colectomy, appendectomy and umbilical hernial repair)  Pt husband is [place] @ [phone]

## 2019-02-18 NOTE — ED Notes (Signed)
Pt verbalized understanding of d/c instructions and has no further questions, VSS, NAD. Pt's urine culture was collected and pt will be called with results. Pt to call pcp to follow up.

## 2019-02-18 NOTE — ED Notes (Signed)
Patient transported to CT 

## 2019-02-18 NOTE — ED Provider Notes (Signed)
[facility]PART[facility] Provider Note  CSN: [id number] Arrival date & time: 02/18/19 0546  Chief Complaint(s) Abdominal Pain  HPI Dana Kennedy is a 53 y.o. female with a past medical history listed below including complicated diverticulitis non-responsive to antibiotics requiring partial colectomy at which time she also had an appendectomy and umbilical hernia repair.  She presents today with 2 days of gradually worsening lower abdominal pain and distention.  Majority of the pain is periumbilical on the right.  Pain is described as a deep ache and sharp pain.  Pain is waxing and waning.  Pain is exacerbated with palpation.  No alleviating factors.  She endorses 4 episodes of nonbloody non-bilious emesis yesterday related to the pain.  Last bowel movement was approximately 8 hours ago.  She reports that she is still passing gas.  She denies any fevers or chills.  No coughing or congestion.  No chest pain or shortness of breath.  No urinary symptoms.  Denies any other physical complaints at this time.  HPI  Past Medical History Past Medical History:  Diagnosis Date   Anemia    Asthma    Chest pain    atypcial chest pain evaluation 07/10/18 Philbert Riser, Coletta Memos, MD), echo order (NL LVEF, wall motion 09/2018)   Chronic lower back pain    Diverticulitis    GERD (gastroesophageal reflux disease)    History of blood transfusion    "HgB low w/lupus flareup" (08/19/2018)   History of ITP 2014   NOT PROBLEM NOW   Rheumatoid arthritis ([place])    "legs; all my bones" (08/19/2018)   SLE (systemic lupus erythematosus) ([place])    Umbilical hernia    Patient Active Problem List   Diagnosis Date Noted   Diverticulitis large intestine 10/24/2018   Acute diverticulitis 08/18/2018   Vaginal spotting 08/18/2018   GERD (gastroesophageal reflux disease)    Asthma    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 Authorizing Provider  acetaminophen (TYLENOL) 325 MG tablet Take 2 tablets (650 mg total) by mouth every 6 (six) hours as needed for mild pain (or Fever >/= 101). 08/26/18  Yes Emokpae, Courage, MD  budesonide-formoterol (SYMBICORT) 160-4.5 MCG/ACT inhaler Inhale 2 puffs into the lungs 2 (two) times daily. 11/12/17  Yes Mannam, Praveen, MD  fluticasone (FLONASE) 50 MCG/ACT nasal spray Place 2 sprays into both nostrils daily. 11/12/17  Yes Mannam, Praveen, MD  fluticasone furoate-vilanterol (BREO ELLIPTA) 200-25 MCG/INH AEPB Inhale 1 puff into the lungs daily. 06/10/18  Yes Mannam, Praveen, MD  oxyCODONE-acetaminophen (PERCOCET/ROXICET) 5-325 MG tablet Take 1 tablet by mouth every 6 (six) hours as needed for severe pain. 01/06/19  Yes Lawyer, Harrell Gave, PA-C  pantoprazole (PROTONIX) 40 MG tablet Take 1 tablet (40 mg total) by mouth daily. 08/26/18  Yes Emokpae, Courage, MD  albuterol (PROVENTIL HFA;VENTOLIN HFA) 108 (90 Base) MCG/ACT inhaler Inhale 2 puffs into the lungs every 4 (four) hours as needed for wheezing or shortness of breath. Patient not taking: Reported on 02/18/2019 04/14/17   Orpah Greek, MD  chlorpheniramine (CHLOR-TRIMETON) 4 MG tablet Take 2 tablets (8 mg total) by mouth  3 (three) times daily. Patient not taking: Reported on 01/20/2019 12/25/17   Marshell Garfinkel, MD  ibuprofen (ADVIL,MOTRIN) 800 MG tablet Take 1 tablet (800 mg total) by mouth every 8 (eight) hours as needed. Patient not taking: Reported on 01/20/2019 10/29/18   Kinsinger, Arta Bruce, MD  Nicotine 21-14-7 MG/24HR KIT Take as  directed Patient not taking: Reported on 10/23/2018 03/05/18   Marshell Garfinkel, MD                                                                                                                                    Past Surgical History Past Surgical History:  Procedure Laterality Date   HERNIA REPAIR     PARTIAL COLECTOMY N/A 10/24/2018   Procedure: PARTIAL COLON RESECTION WITH ANASTOMOSIS; APPENDECTOMY  ERAS PATHWAY SPLENIC FLEXURE MOBILIZATION; UMBILICAL HERNIA REPAIR;  Surgeon: Kinsinger, Arta Bruce, MD;  Location: [facility];  Service: General;  Laterality: N/A;   stomach ulcer surgery  2008   TONSILLECTOMY     TOTAL HIP ARTHROPLASTY  06/14/2012   Procedure: TOTAL HIP ARTHROPLASTY ANTERIOR APPROACH;  Surgeon: Mcarthur Rossetti, MD;  Location: [facility];  Service: Orthopedics;  Laterality: Left;  Left Total Hip Arthroplasty, Anterior Approach C-Arm   TUBAL LIGATION     UMBILICAL HERNIA REPAIR     Family History Family History  Problem Relation Age of Onset   Cancer Mother    Hypertension Mother    Heart attack Mother    Liver disease Father    Hypertension Brother     Social History Social History   Tobacco Use   Smoking status: Former Smoker    Packs/day: 2.50    Years: 29.00    Pack years: 72.50    Types: Cigarettes    Last attempt to quit: 09/01/2018    Years since quitting: 0.4   Smokeless tobacco: Never Used  Substance Use Topics   Alcohol use: Yes    Comment: 08/19/2018 "glass of wine a couple times/year"   Drug use: No   Allergies Aspirin; Eszopiclone; Gabapentin; Allegra [fexofenadine]; and Restoril [temazepam]  Review of Systems Review of Systems All other systems are reviewed and are negative for acute change except as noted in the HPI  Physical Exam Vital Signs  I have reviewed the triage vital signs BP (!) 134/96 (BP Location: Right Arm)    Pulse 98    Temp 98.7 F (37.1 C) (Oral)    Resp 18    Ht '5\' 5"'  (1.651 m)    Wt 96.2 kg    LMP  02/03/2018    SpO2 99%    BMI 35.28 kg/m   Physical Exam Vitals signs reviewed.  Constitutional:      General: She is not in acute distress.    Appearance: She is well-developed. She is not diaphoretic.  HENT:     Head: Normocephalic and atraumatic.     Nose: Nose normal.  Eyes:  General: No scleral icterus.       Right eye: No discharge.        Left eye: No discharge.     Conjunctiva/sclera: Conjunctivae normal.     Pupils: Pupils are equal, round, and reactive to light.  Neck:     Musculoskeletal: Normal range of motion and neck supple.  Cardiovascular:     Rate and Rhythm: Normal rate and regular rhythm.     Heart sounds: No murmur. No friction rub. No gallop.   Pulmonary:     Effort: Pulmonary effort is normal. No respiratory distress.     Breath sounds: Normal breath sounds. No stridor. No rales.  Abdominal:     General: There is distension.     Palpations: Abdomen is soft.     Tenderness: There is generalized abdominal tenderness (but worse in right side and lower abd) and tenderness in the right upper quadrant, right lower quadrant, periumbilical area, suprapubic area and left lower quadrant. There is guarding. There is no rebound.  Musculoskeletal:        General: No tenderness.  Skin:    General: Skin is warm and dry.     Findings: No erythema or rash.  Neurological:     Mental Status: She is alert and oriented to person, place, and time.     ED Results and Treatments Labs (all labs ordered are listed, but only abnormal results are displayed) Labs Reviewed  COMPREHENSIVE METABOLIC PANEL - Abnormal; Notable for the following components:      Result Value   Glucose, Bld 111 (*)    All other components within normal limits  CBC - Abnormal; Notable for the following components:   RBC 5.53 (*)    MCV 78.5 (*)    MCH 25.5 (*)    All other components within normal limits  LIPASE, BLOOD  URINALYSIS, ROUTINE W REFLEX MICROSCOPIC                                                                                                                          EKG  EKG Interpretation  Date/Time:    Ventricular Rate:    PR Interval:    QRS Duration:   QT Interval:    QTC Calculation:   R Axis:     Text Interpretation:        Radiology   Medications Ordered in ED Medications  sodium chloride flush (NS) 0.9 % injection 3 mL (3 mLs Intravenous Given 02/18/19 0757)  sodium chloride 0.9 % bolus 1,000 mL (1,000 mLs Intravenous New Bag/Given 02/18/19 0756)  morphine 4 MG/ML injection 4 mg (4 mg Intravenous Given 02/18/19 0756)  ondansetron (ZOFRAN) injection 4 mg (4 mg Intravenous Given 02/18/19 0756)  iopamidol (ISOVUE-300) 61 % injection 100 mL (100 mLs Intravenous Contrast Given 02/18/19 0723)  Procedures Procedures  (including critical care time)  Medical Decision Making / ED Course I have reviewed the nursing notes for this encounter and the patient's prior records (if available in EHR or on provided paperwork).    Patient presents with 2 days of gradually worsening abdominal distention and pain.  She is afebrile stable vital signs.  On exam patient does have a distended abdomen and is tender to palpation.  She has a right periumbilical hernia which appears to be soft but is quite tender.  Labs and imaging obtained to assess for intraabd infection vs SBO.  Patient care turned over to Dr Alvino Chapel at 0730. Patient case and results discussed in detail; please see their note for further ED managment.     Final Clinical Impression(s) / ED Diagnoses Final diagnoses:  Generalized abdominal pain      This chart was dictated using voice recognition software.  Despite best efforts to proofread,  errors can occur which can change the documentation meaning.   Fatima Blank, MD 02/18/19 [phone]

## 2019-02-19 LAB — URINE CULTURE

## 2019-03-01 ENCOUNTER — Ambulatory Visit: Payer: Medicaid Other

## 2019-03-05 ENCOUNTER — Ambulatory Visit: Payer: Self-pay | Admitting: Diagnostic Neuroimaging

## 2019-03-10 ENCOUNTER — Inpatient Hospital Stay: Payer: Medicaid Other | Admitting: Hematology

## 2019-03-10 ENCOUNTER — Inpatient Hospital Stay: Payer: Medicaid Other | Attending: Hematology

## 2019-05-14 IMAGING — CT CT ABD-PELV W/ CM
2 of 5 series · 16 of 46 positions shown, 18 images · IV contrast (omnipaque)
Comparison: Body CT 08/16/2018 and 05/06/2018

CLINICAL DATA: Abdominal pain suspected diverticulitis.

EXAM:
CT ABDOMEN AND PELVIS WITH CONTRAST
TECHNIQUE: Multidetector CT imaging of the abdomen and pelvis was performed
using the standard protocol following bolus administration of
intravenous contrast.
CONTRAST:  100mL OMNIPAQUE IOHEXOL 300 MG/ML  SOLN

[Series 4: a/p w/ 5mm · axial · 0.98mm/px · z∈[-506,-91]mm · 13 of 93 slices shown, 15 images]
[im 5/93  soft-tissue]
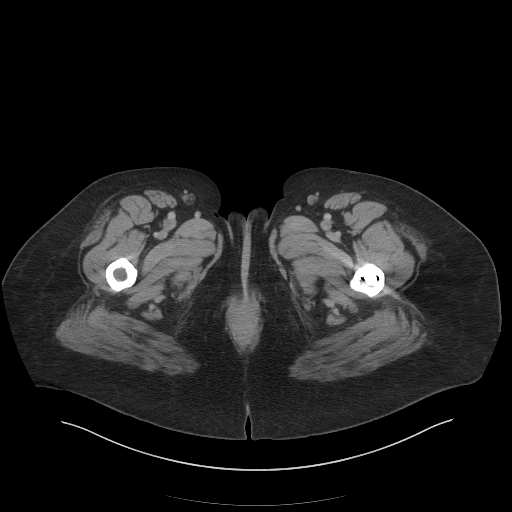
[im 5/93  bone]
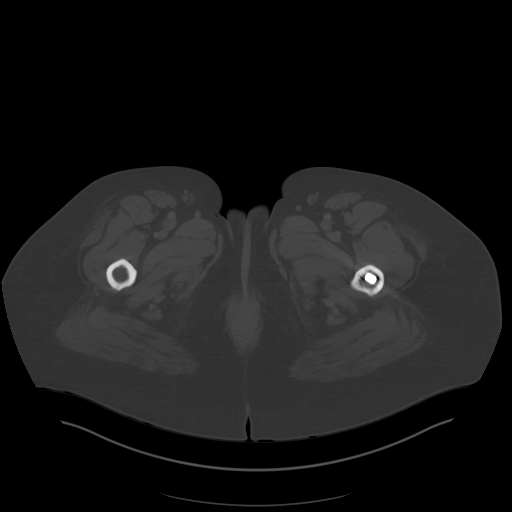
[im 15/93  soft-tissue]
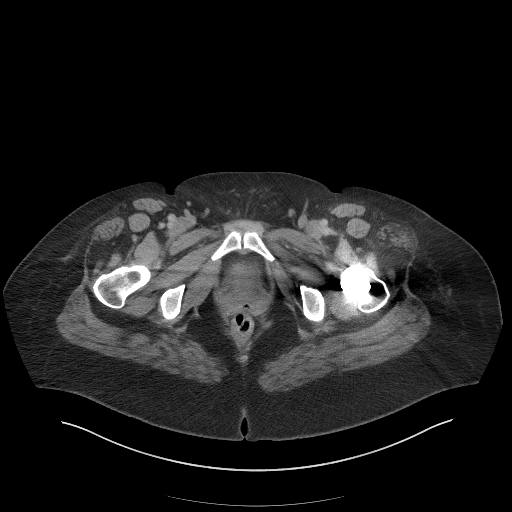
[im 20/93  soft-tissue]
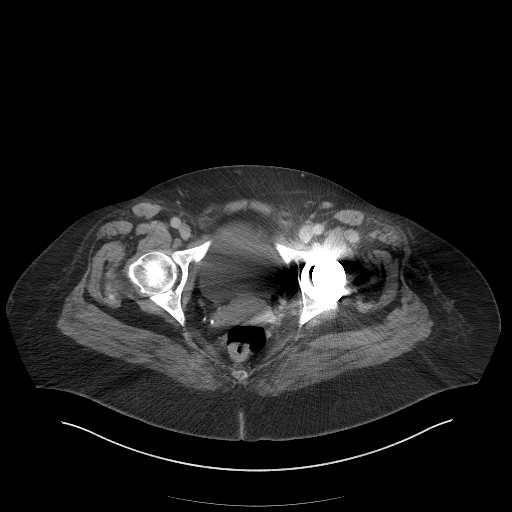
[im 25/93  soft-tissue]
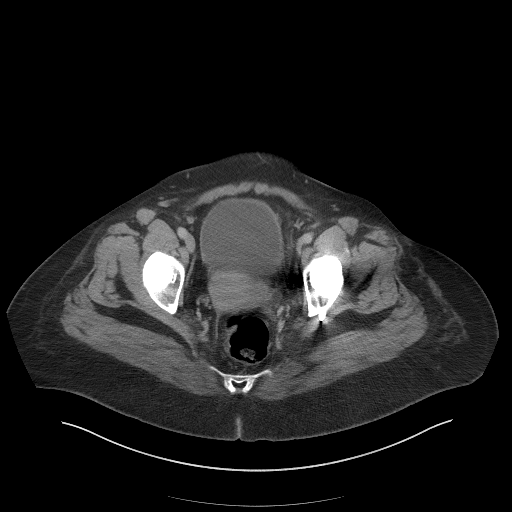
[im 34/93  soft-tissue]
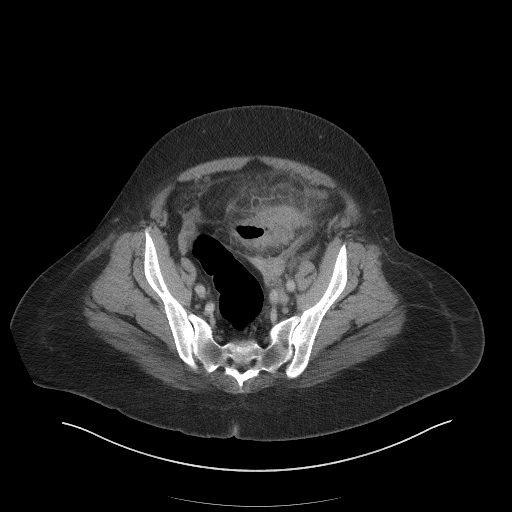
[im 39/93  soft-tissue]
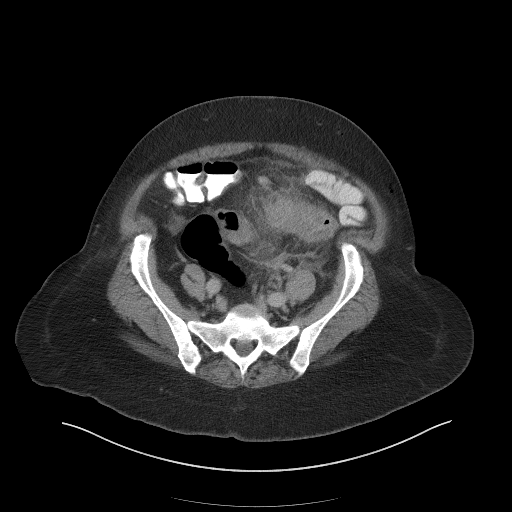
[im 49/93  soft-tissue]
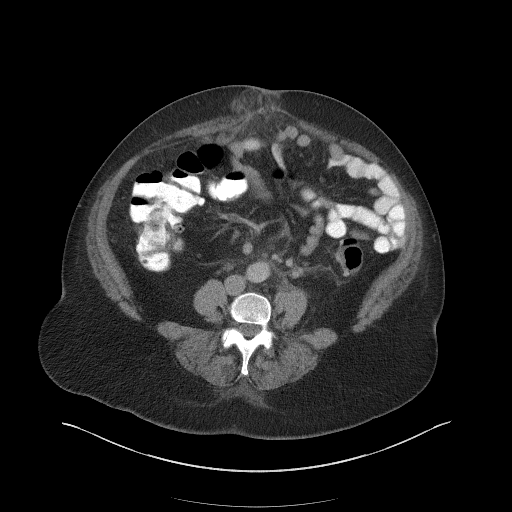
[im 54/93  soft-tissue]
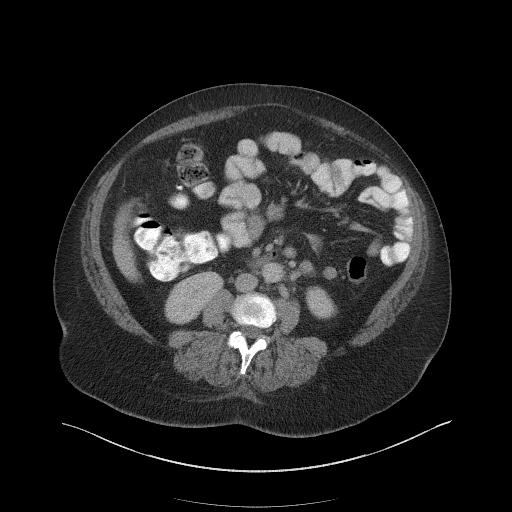
[im 59/93  soft-tissue]
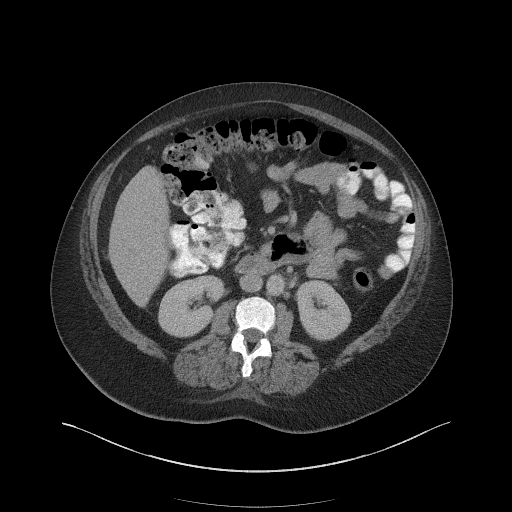
[im 59/93  bone]
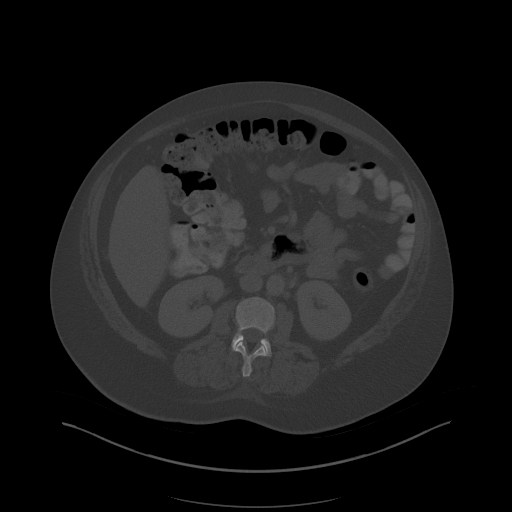
[im 68/93  soft-tissue]
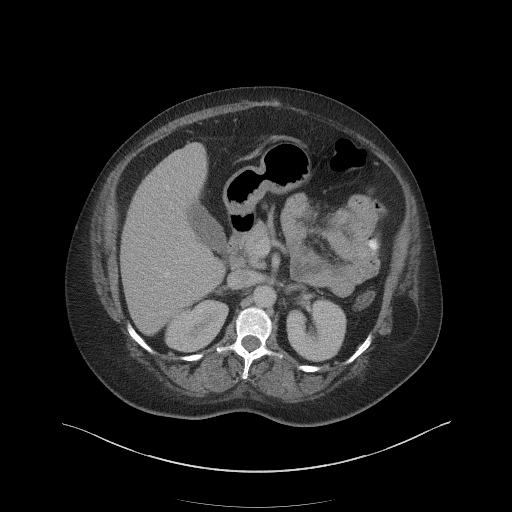
[im 73/93  soft-tissue]
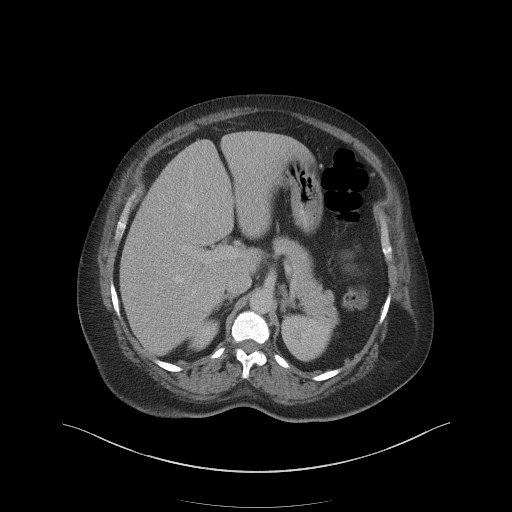
[im 78/93  soft-tissue]
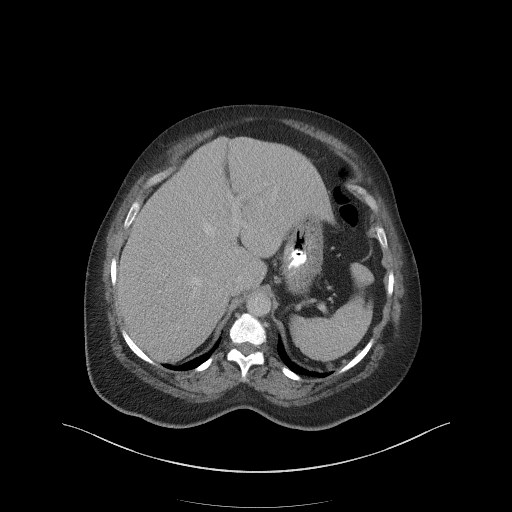
[im 88/93  soft-tissue]
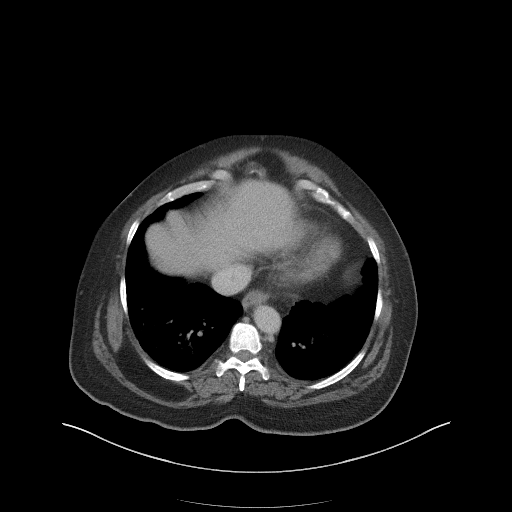

[Series 7: a/p w/ cor · coronal · 0.81mm/px · 3 of 161 slices shown]
[im 54/161  soft-tissue]
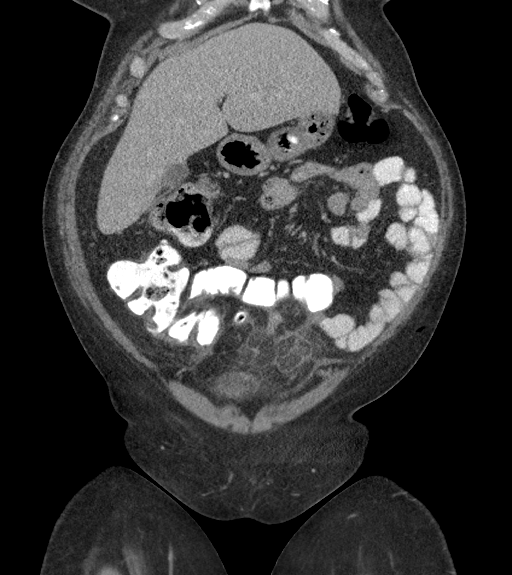
[im 72/161  soft-tissue]
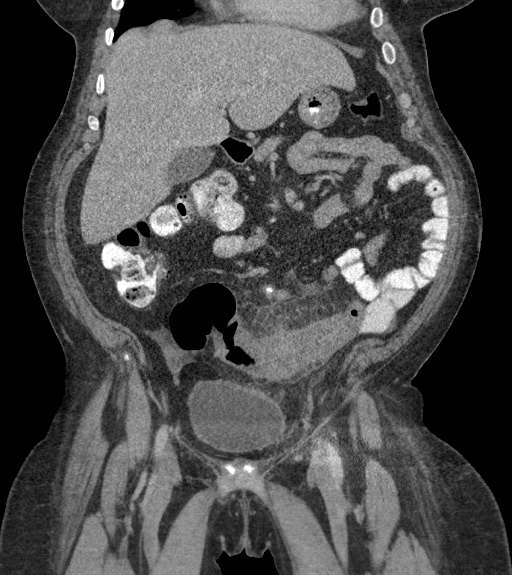
[im 89/161  soft-tissue]
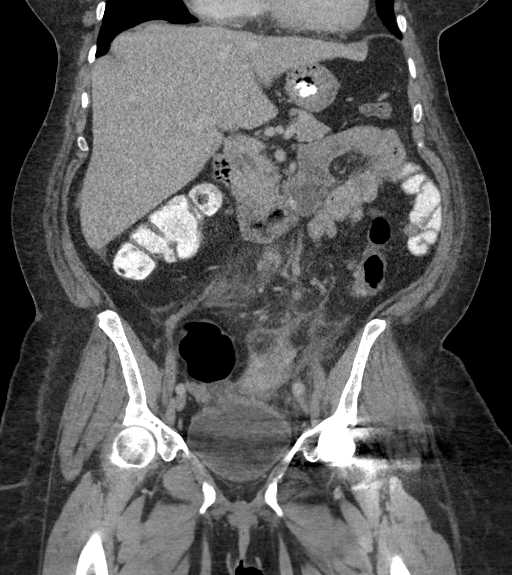

[16 of 46 positions shown; findings below may reference images not displayed]

FINDINGS: Lower chest: Hyperventilatory changes in the right lung base.

Hepatobiliary: No focal liver abnormality is seen. No gallstones,
gallbladder wall thickening, or biliary dilatation.

Pancreas: Unremarkable. No pancreatic ductal dilatation or
surrounding inflammatory changes.

Spleen: Normal in size without focal abnormality.

Adrenals/Urinary Tract: Adrenal glands are unremarkable. Kidneys are
normal, without renal calculi, focal lesion, or hydronephrosis.
Bladder is unremarkable.

Stomach/Bowel: Normal appearance of the stomach and small bowel.
Scattered colonic diverticulosis. The area of presumed
diverticulitis in the sigmoid colon demonstrates interval worsening
inflammatory changes. There is a relatively long segment of
circumferential marked mucosal thickening of the involved sigmoid
colon with an intramural area of hypoattenuation which measures
x 2.1 x 1.9 cm, likely representing an abscess. The appendix is long
and crosses the midline with extension to the left lower quadrant.
The distal part of the appendix, which abuts the abnormal sigmoid
colon, is thickened to 11 mm, fluid-filled and with indistinct
borders, consistent with tip appendicitis, likely secondary to the
inflammatory process of the sigmoid colon. There is marked
pericolonic inflammatory stranding. Interval development of adjacent
mesenteric lymphadenopathy. Small amount of free fluid tracks into
the left adnexa.

Vascular/Lymphatic: Likely reactive central mesenteric
lymphadenopathy.

Reproductive: Normal appearance of the uterus and the right adnexa.
Inflammatory fluid within the left adnexa.

Other: Known periumbilical fat and fluid containing anterior
abdominal wall hernia.

Musculoskeletal: Post left hip arthroplasty, stable.
IMPRESSION: Worsening of the presumed sigmoid colon diverticulitis with marked
thickening of the involved sigmoid colon, pericolonic inflammatory
changes, 2.7 cm intramural abscess, and secondary tip appendicitis.

Small amount of inflammatory fluid tracks into the left adnexa.

Likely reactive central mesenteric lymphadenopathy.

These results were called by telephone at the time of interpretation
on 08/21/2018 at [DATE] to Dr. DEPOSITO KAMARA , who verbally
acknowledged these results.

## 2019-07-20 ENCOUNTER — Encounter (HOSPITAL_COMMUNITY): Payer: Self-pay | Admitting: Emergency Medicine

## 2019-07-20 ENCOUNTER — Emergency Department (HOSPITAL_COMMUNITY)
Admission: EM | Admit: 2019-07-20 | Discharge: 2019-07-20 | Disposition: A | Discharge status: Home / Self Care | Payer: Medicaid Other | Attending: Emergency Medicine | Admitting: Emergency Medicine

## 2019-07-20 ENCOUNTER — Other Ambulatory Visit: Payer: Self-pay

## 2019-07-20 ENCOUNTER — Emergency Department (HOSPITAL_COMMUNITY): Payer: Medicaid Other

## 2019-07-20 DIAGNOSIS — J45909 Unspecified asthma, uncomplicated: Secondary | ICD-10-CM | POA: Insufficient documentation

## 2019-07-20 DIAGNOSIS — K420 Umbilical hernia with obstruction, without gangrene: Secondary | ICD-10-CM

## 2019-07-20 DIAGNOSIS — B3749 Other urogenital candidiasis: Secondary | ICD-10-CM | POA: Insufficient documentation

## 2019-07-20 DIAGNOSIS — M3210 Systemic lupus erythematosus, organ or system involvement unspecified: Secondary | ICD-10-CM | POA: Insufficient documentation

## 2019-07-20 DIAGNOSIS — Z79899 Other long term (current) drug therapy: Secondary | ICD-10-CM | POA: Diagnosis not present

## 2019-07-20 DIAGNOSIS — Z20828 Contact with and (suspected) exposure to other viral communicable diseases: Secondary | ICD-10-CM | POA: Insufficient documentation

## 2019-07-20 DIAGNOSIS — Z87891 Personal history of nicotine dependence: Secondary | ICD-10-CM | POA: Diagnosis not present

## 2019-07-20 DIAGNOSIS — R109 Unspecified abdominal pain: Secondary | ICD-10-CM | POA: Diagnosis present

## 2019-07-20 LAB — URINALYSIS, ROUTINE W REFLEX MICROSCOPIC
Bilirubin Urine: NEGATIVE
Glucose, UA: NEGATIVE mg/dL
Ketones, ur: NEGATIVE mg/dL
Nitrite: NEGATIVE
Protein, ur: NEGATIVE mg/dL
Specific Gravity, Urine: 1.025 (ref 1.005–1.030)
WBC, UA: >50 WBC/hpf — ABNORMAL HIGH (ref 0–5)
pH: 6 (ref 5.0–8.0)

## 2019-07-20 LAB — CBC WITH DIFFERENTIAL/PLATELET
Abs Immature Granulocytes: 0.03 10*3/uL (ref 0.00–0.07)
Basophils Absolute: 0 10*3/uL (ref 0.0–0.1)
Basophils Relative: 1 %
Eosinophils Absolute: 0.3 10*3/uL (ref 0.0–0.5)
Eosinophils Relative: 4 %
HCT: 41.4 % (ref 36.0–46.0)
Hemoglobin: 12.9 g/dL (ref 12.0–15.0)
Immature Granulocytes: 0 %
Lymphocytes Relative: 31 %
Lymphs Abs: 2.4 10*3/uL (ref 0.7–4.0)
MCH: 26.2 pg (ref 26.0–34.0)
MCHC: 31.2 g/dL (ref 30.0–36.0)
MCV: 84 fL (ref 80.0–100.0)
Monocytes Absolute: 0.7 10*3/uL (ref 0.1–1.0)
Monocytes Relative: 10 %
Neutro Abs: 4.1 10*3/uL (ref 1.7–7.7)
Neutrophils Relative %: 54 %
Platelets: 387 10*3/uL (ref 150–400)
RBC: 4.93 MIL/uL (ref 3.87–5.11)
RDW: 14.8 % (ref 11.5–15.5)
WBC: 7.6 10*3/uL (ref 4.0–10.5)
nRBC: 0 % (ref 0.0–0.2)

## 2019-07-20 LAB — COMPREHENSIVE METABOLIC PANEL
ALT: 14 U/L (ref 0–44)
AST: 16 U/L (ref 15–41)
Albumin: 3.7 g/dL (ref 3.5–5.0)
Alkaline Phosphatase: 89 U/L (ref 38–126)
Anion gap: 13 (ref 5–15)
BUN: 9 mg/dL (ref 6–20)
CO2: 24 mmol/L (ref 22–32)
Calcium: 9.4 mg/dL (ref 8.9–10.3)
Chloride: 105 mmol/L (ref 98–111)
Creatinine, Ser: 0.65 mg/dL (ref 0.44–1.00)
GFR calc Af Amer: >60 mL/min (ref >60)
GFR calc non Af Amer: >60 mL/min (ref >60)
Glucose, Bld: 101 mg/dL — ABNORMAL HIGH (ref 70–99)
Potassium: 3.9 mmol/L (ref 3.5–5.1)
Sodium: 142 mmol/L (ref 135–145)
Total Bilirubin: 0.6 mg/dL (ref 0.3–1.2)
Total Protein: 6.5 g/dL (ref 6.5–8.1)

## 2019-07-20 LAB — LIPASE, BLOOD: Lipase: 27 U/L (ref 11–51)

## 2019-07-20 LAB — I-STAT BETA HCG BLOOD, ED (MC, WL, AP ONLY): I-stat hCG, quantitative: <5 m[IU]/mL (ref <5)

## 2019-07-20 MED ORDER — METRONIDAZOLE 500 MG PO TABS: 500.0000 mg | ORAL_TABLET | Freq: Two times a day (BID) | ORAL | 0 refills | Status: DC

## 2019-07-20 MED ORDER — NICOTINE 7 MG/24HR TD PT24
7.0000 mg | MEDICATED_PATCH | Freq: Once | TRANSDERMAL | Status: DC
  Administered 2019-07-20: 12:00:00 7 mg via TRANSDERMAL
  Filled 2019-07-20: qty 1

## 2019-07-20 MED ORDER — OXYCODONE-ACETAMINOPHEN 10-325 MG PO TABS: 1.0000 | ORAL_TABLET | Freq: Four times a day (QID) | ORAL | 0 refills | Status: DC | PRN

## 2019-07-20 MED ORDER — ONDANSETRON HCL 4 MG/2ML IJ SOLN
4.0000 mg | Freq: Once | INTRAMUSCULAR | Status: AC
  Administered 2019-07-20: 11:00:00 4 mg via INTRAVENOUS
  Filled 2019-07-20: qty 2

## 2019-07-20 MED ORDER — FENTANYL CITRATE (PF) 100 MCG/2ML IJ SOLN
25.0000 ug | Freq: Once | INTRAMUSCULAR | Status: AC
  Administered 2019-07-20: 14:00:00 25 ug via INTRAVENOUS
  Filled 2019-07-20: qty 2

## 2019-07-20 MED ORDER — SODIUM CHLORIDE 0.9 % IV SOLN
INTRAVENOUS | Status: DC
  Administered 2019-07-20: 11:00:00 via INTRAVENOUS

## 2019-07-20 MED ORDER — FENTANYL CITRATE (PF) 100 MCG/2ML IJ SOLN
25.0000 ug | Freq: Once | INTRAMUSCULAR | Status: AC
  Administered 2019-07-20: 25 ug via INTRAVENOUS
  Filled 2019-07-20: qty 2

## 2019-07-20 MED ORDER — IOHEXOL 300 MG/ML  SOLN
100.0000 mL | Freq: Once | INTRAMUSCULAR | Status: AC | PRN
  Administered 2019-07-20: 12:00:00 100 mL via INTRAVENOUS

## 2019-07-20 NOTE — Discharge Instructions (Signed)
As discussed, today's evaluation has been notable for demonstration of a umbilical hernia containing a small portion of fat, that is inflamed, contributing to your pain. It is important that you follow-up with our surgical colleagues in their office. Please call tomorrow for the next available appointment. You have also been provided medication for likely yeast infection, and an additional test has been conducted on your urinalysis to ensure that there is not an additional bacterial infection.  Return here for concerning changes in your condition.

## 2019-07-20 NOTE — ED Provider Notes (Signed)
[facility] EMERGENCY DEPARTMENT Provider Note   CSN: [id number] Arrival date & time: 07/20/19  7510     History   Chief Complaint Chief Complaint  Patient presents with   Abdominal Pain    HPI Dana Kennedy is a 53 y.o. female.     HPI Patient presents with her husband who assists with the HPI. Patient notes that she, over the past 2 weeks has had worsening abdominal pain, anorexia, nausea, and multiple episodes of vomiting. For the past days she has had increasing inability to sleep as well secondary to pain. It is unclear if she has any clear interventions that alleviate her pain, but the pain is become substantially worse, prompting today's evaluation peer Pain is focally in the left lower quadrant, with diffuse radiation, sharp, severe, worse with motion or palpation. Patient has a notable history of prior surgery, hernia repair with mesh in February of this year. She notes that since that time she has had some degree of pain, waxing, waning, but nothing as a sustained, or as severe as this episode. On review of systems questioning patient also complains of sore throat, states that she may have a yeast infection, notes that both of these concerns have been going on for quite some time.  Neither is appreciably changed. She denies other new concerns including fever, difficulty breathing.  She does have a cough, acknowledges a smoking addiction. Past Medical History:  Diagnosis Date   Anemia    Asthma    Chest pain    atypcial chest pain evaluation 07/10/18 Philbert Riser, Coletta Memos, MD), echo order (NL LVEF, wall motion 09/2018)   Chronic lower back pain    Diverticulitis    GERD (gastroesophageal reflux disease)    History of blood transfusion    "HgB low w/lupus flareup" (08/19/2018)   History of ITP 2014   NOT PROBLEM NOW   Rheumatoid arthritis ([place])    "legs; all my bones" (08/19/2018)   SLE (systemic lupus erythematosus) ([place])     Umbilical hernia     Patient Active Problem List   Diagnosis Date Noted   Diverticulitis large intestine 10/24/2018   Acute diverticulitis 08/18/2018   Vaginal spotting 08/18/2018   GERD (gastroesophageal reflux disease)    Asthma    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;  Service: General;  Laterality: N/A;   stomach ulcer surgery  2008   TONSILLECTOMY     TOTAL HIP ARTHROPLASTY  06/14/2012   Procedure: TOTAL HIP ARTHROPLASTY ANTERIOR APPROACH;  Surgeon: Mcarthur Rossetti, MD;  [facility];  Service: Orthopedics;  Laterality: Left;  Left Total Hip Arthroplasty, Anterior Approach C-Arm   TUBAL LIGATION     UMBILICAL HERNIA REPAIR       OB History   No obstetric history on file.      Home Medications    Prior to Admission medications   Medication Sig Start Date End  Date Taking? Authorizing Provider  acetaminophen (TYLENOL) 325 MG tablet Take 2 tablets (650 mg total) by mouth every 6 (six) hours as needed for mild pain (or Fever >/= 101). 08/26/18   Roxan Hockey, MD  albuterol (PROVENTIL HFA;VENTOLIN HFA) 108 (90 Base) MCG/ACT inhaler Inhale 2 puffs into the lungs every 4 (four) hours as needed for wheezing or shortness of breath. Patient not taking: Reported on 02/18/2019 04/14/17   Orpah Greek, MD  budesonide-formoterol  [facility]) 160-4.5 MCG/ACT inhaler Inhale 2 puffs into the lungs 2 (two) times daily. 11/12/17   Mannam, Hart Robinsons, MD  chlorpheniramine (CHLOR-TRIMETON) 4 MG tablet Take 2 tablets (8 mg total) by mouth 3 (three) times daily. Patient not taking: Reported on 01/20/2019 12/25/17   Marshell Garfinkel, MD  dicyclomine (BENTYL) 20 MG tablet Take 1 tablet (20 mg total) by mouth every 8 (eight) hours as needed for spasms. 02/18/19   Davonna Belling, MD  fluticasone (FLONASE) 50 MCG/ACT nasal spray Place 2 sprays into both nostrils daily. 11/12/17   Mannam, Hart Robinsons, MD  fluticasone furoate-vilanterol (BREO ELLIPTA) 200-25 MCG/INH AEPB Inhale 1 puff into the lungs daily. 06/10/18   Mannam, Hart Robinsons, MD  ibuprofen (ADVIL,MOTRIN) 800 MG tablet Take 1 tablet (800 mg total) by mouth every 8 (eight) hours as needed. Patient not taking: Reported on 01/20/2019 10/29/18   Kinsinger, Arta Bruce, MD  Nicotine 21-14-7 MG/24HR KIT Take as directed Patient not taking: Reported on 10/23/2018 03/05/18   Marshell Garfinkel, MD  oxyCODONE-acetaminophen (PERCOCET/ROXICET) 5-325 MG tablet Take 1 tablet by mouth every 6 (six) hours as needed for severe pain. 01/06/19   Lawyer, Harrell Gave, PA-C  pantoprazole (PROTONIX) 40 MG tablet Take 1 tablet (40 mg total) by mouth daily. 08/26/18   Roxan Hockey, MD    Family History Family History  Problem Relation Age of Onset   Cancer Mother    Hypertension Mother    Heart attack Mother    Liver disease Father    Hypertension Brother     Social History Social History   Tobacco Use   Smoking status: Former Smoker    Packs/day: 2.50    Years: 29.00    Pack years: 72.50    Types: Cigarettes    Quit date: 09/01/2018    Years since quitting: 0.8   Smokeless tobacco: Never Used  Substance Use Topics   Alcohol use: Yes    Comment: 08/19/2018 "glass of wine a couple times/year"   Drug use: No     Allergies   Aspirin, Eszopiclone, Gabapentin, Allegra [fexofenadine], and Restoril  [temazepam]   Review of Systems Review of Systems  Constitutional:       Per HPI, otherwise negative  HENT:       Per HPI, otherwise negative  Respiratory:       Per HPI, otherwise negative  Cardiovascular:       Per HPI, otherwise negative  Gastrointestinal: Positive for abdominal pain, nausea and vomiting.  Endocrine:       Negative aside from HPI  Genitourinary:       Neg aside from HPI   Musculoskeletal:       Per HPI, otherwise negative  Skin: Negative.   Neurological: Positive for weakness. Negative for syncope.     Physical Exam Updated Vital Signs BP (!) 134/94 (BP Location: Right Arm)    Pulse 90    Temp 98.6 F (37 C) (Oral)    Resp 20    LMP 02/03/2018    SpO2 99%   Physical  Exam Vitals signs and nursing note reviewed.  Constitutional:      General: She is not in acute distress.    Appearance: She is well-developed. She is obese.  HENT:     Head: Normocephalic and atraumatic.     Mouth/Throat:     Mouth: Mucous membranes are moist. No injury or lacerations.     Dentition: Normal dentition.  Eyes:     Conjunctiva/sclera: Conjunctivae normal.  Cardiovascular:     Rate and Rhythm: Normal rate and regular rhythm.  Pulmonary:     Effort: Pulmonary effort is normal. No respiratory distress.     Breath sounds: Normal breath sounds. No stridor.  Abdominal:     General: There is no distension.     Tenderness: There is abdominal tenderness.     Comments: Patient has both diffuse and focal tenderness in the left lower quadrant with guarding throughout.  Skin:    General: Skin is warm and dry.  Neurological:     Mental Status: She is alert and oriented to person, place, and time.     Cranial Nerves: No cranial nerve deficit.      ED Treatments / Results  Labs (all labs ordered are listed, but only abnormal results are displayed) Labs Reviewed  COMPREHENSIVE METABOLIC PANEL - Abnormal; Notable for the following components:      Result Value   Glucose,  Bld 101 (*)    All other components within normal limits  URINALYSIS, ROUTINE W REFLEX MICROSCOPIC - Abnormal; Notable for the following components:   APPearance HAZY (*)    Hgb urine dipstick MODERATE (*)    Leukocytes,Ua LARGE (*)    WBC, UA >50 (*)    Bacteria, UA RARE (*)    All other components within normal limits  NOVEL CORONAVIRUS, NAA (HOSP ORDER, SEND-OUT TO REF LAB; TAT 18-24 HRS)  LIPASE, BLOOD  CBC WITH DIFFERENTIAL/PLATELET  I-STAT BETA HCG BLOOD, ED ([facility], [facility], [facility] ONLY)    EKG None  Radiology Ct Abdomen Pelvis W Contrast  Result Date: 07/20/2019 CLINICAL DATA:  Patient with nausea, vomiting, left-sided abdominal pain. On she does have history of surgery, earlier in the year with mesh placement for hernia repair. EXAM: CT ABDOMEN AND PELVIS WITH CONTRAST TECHNIQUE: Multidetector CT imaging of the abdomen and pelvis was performed using the standard protocol following bolus administration of intravenous contrast. CONTRAST:  127m OMNIPAQUE IOHEXOL 300 MG/ML  SOLN COMPARISON:  02/18/2019 and older studies. FINDINGS: Lower chest: No acute abnormality. Hepatobiliary: No focal liver abnormality is seen. No gallstones, gallbladder wall thickening, or biliary dilatation. Pancreas: Unremarkable. No pancreatic ductal dilatation or surrounding inflammatory changes. Spleen: Normal in size without focal abnormality. Adrenals/Urinary Tract: No adrenal masses. Kidneys normal in size, orientation and position. 6 mm low-density mass, upper pole the right kidney, consistent with a cyst. 3-4 mm low-density lesion anterior midpole the left kidney, too small to characterize but also likely a cyst. No other masses, no stones and no hydronephrosis. Normal ureters. Bladder is decompressed but otherwise unremarkable. Stomach/Bowel: Stable mid sigmoid colon bowel anastomosis staple line.:: Normal in caliber. No wall thickening or inflammation. Normal stomach and small bowel. Vascular/Lymphatic: Vasculature  unremarkable. No enlarged lymph nodes. Reproductive: Uterus and bilateral adnexa are unremarkable. Other: Umbilical hernia. This contains fat as well as stranding and fluid attenuation along its margins. Hernia base/fascial defect, measures 4.7 cm. Herniated fat with its associated inflammatory change measures 9.2 x 8.9 cm. This has increased in size from the prior CT. No  ascites. Musculoskeletal: Well-positioned left hip total arthroplasty. No fracture or acute finding. No bone lesion. Left flank lipoma measuring 7.4 x 3.2 x 6 cm. IMPRESSION: 1. Fat containing umbilical hernia which has increased in size from the prior CT. Contains stranding and fluid attenuation consistent with inflammation. 2. No acute findings within the abdomen or pelvis. No bowel inflammation. 3. Stable changes from a previous left colon resection and mid sigmoid colon anastomosis. Electronically Signed   By: Lajean Manes M.D.   On: 07/20/2019 12:45    Procedures Procedures (including critical care time)  Medications Ordered in ED Medications  0.9 %  sodium chloride infusion ( Intravenous New Bag/Given 07/20/19 1039)  nicotine (NICODERM CQ - dosed in mg/24 hr) patch 7 mg (7 mg Transdermal Patch Applied 07/20/19 1150)  fentaNYL (SUBLIMAZE) injection 25 mcg (25 mcg Intravenous Given 07/20/19 1035)  ondansetron (ZOFRAN) injection 4 mg (4 mg Intravenous Given 07/20/19 1035)  iohexol (OMNIPAQUE) 300 MG/ML solution 100 mL (100 mLs Intravenous Contrast Given 07/20/19 1156)  fentaNYL (SUBLIMAZE) injection 25 mcg (25 mcg Intravenous Given 07/20/19 1343)     Initial Impression / Assessment and Plan / ED Course  I have reviewed the triage vital signs and the nursing notes.  Pertinent labs & imaging results that were available during my care of the patient were reviewed by me and considered in my medical decision making (see chart for details).        2:15 PM Patient in no distress, awake, alert, hemodynamically  unremarkable. Discussed all findings with the patient and her husband. I have also discussed the patient's CT results with her surgeon from earlier this year, who performed the partial colectomy.  We discussed procedure that point and today's findings. Patient does have fat entrapment and umbilical hernia, with inflammation, but no evidence for bowel entrapment, no hemodynamic stability, no substantial lab abnormalities.  Patient will receive analgesia, will follow-up in the outpatient surgical center for definitive care in this regard. Patient's complaint sore throat is unremarkable, no evidence for pharyngitis. Now, patient with requests medicine for likely yeast infection. Urinalysis consistent with this possibility, with leukocytosis, positive leukocytes, negative nitrite.   Final Clinical Impressions(s) / ED Diagnoses   Final diagnoses:  Umbilical hernia, incarcerated    ED Discharge Orders         Ordered    metroNIDAZOLE (FLAGYL) 500 MG tablet  2 times daily     07/20/19 1420    oxyCODONE-acetaminophen (PERCOCET) 10-325 MG tablet  Every 6 hours PRN     07/20/19 1420           Carmin Muskrat, MD 07/20/19 1420

## 2019-07-20 NOTE — ED Triage Notes (Signed)
C/o R sided abd pain and swelling since hernia repair in February. States it is worse after taking a hot shower.  Also reports nausea and diarrhea.  Pt also believes she has a yeast infection.

## 2019-07-21 LAB — URINE CULTURE: Culture: NO GROWTH

## 2019-07-21 LAB — NOVEL CORONAVIRUS, NAA (HOSP ORDER, SEND-OUT TO REF LAB; TAT 18-24 HRS): SARS-CoV-2, NAA: NOT DETECTED

## 2019-07-30 ENCOUNTER — Other Ambulatory Visit: Payer: Self-pay

## 2019-07-30 ENCOUNTER — Emergency Department (HOSPITAL_COMMUNITY)
Admission: EM | Admit: 2019-07-30 | Discharge: 2019-07-30 | Disposition: A | Discharge status: Home / Self Care | Payer: Medicaid Other | Attending: Emergency Medicine | Admitting: Emergency Medicine

## 2019-07-30 DIAGNOSIS — B9689 Other specified bacterial agents as the cause of diseases classified elsewhere: Secondary | ICD-10-CM

## 2019-07-30 DIAGNOSIS — Z202 Contact with and (suspected) exposure to infections with a predominantly sexual mode of transmission: Secondary | ICD-10-CM | POA: Diagnosis not present

## 2019-07-30 DIAGNOSIS — M3210 Systemic lupus erythematosus, organ or system involvement unspecified: Secondary | ICD-10-CM | POA: Diagnosis not present

## 2019-07-30 DIAGNOSIS — N3000 Acute cystitis without hematuria: Secondary | ICD-10-CM

## 2019-07-30 DIAGNOSIS — K429 Umbilical hernia without obstruction or gangrene: Secondary | ICD-10-CM

## 2019-07-30 DIAGNOSIS — Z87891 Personal history of nicotine dependence: Secondary | ICD-10-CM | POA: Insufficient documentation

## 2019-07-30 DIAGNOSIS — R300 Dysuria: Secondary | ICD-10-CM | POA: Insufficient documentation

## 2019-07-30 DIAGNOSIS — N760 Acute vaginitis: Secondary | ICD-10-CM | POA: Insufficient documentation

## 2019-07-30 DIAGNOSIS — Z79899 Other long term (current) drug therapy: Secondary | ICD-10-CM | POA: Diagnosis not present

## 2019-07-30 DIAGNOSIS — R109 Unspecified abdominal pain: Secondary | ICD-10-CM | POA: Diagnosis present

## 2019-07-30 LAB — URINALYSIS, ROUTINE W REFLEX MICROSCOPIC
Bilirubin Urine: NEGATIVE
Glucose, UA: NEGATIVE mg/dL
Ketones, ur: NEGATIVE mg/dL
Nitrite: NEGATIVE
Protein, ur: 100 mg/dL — AB
Specific Gravity, Urine: 1.03 (ref 1.005–1.030)
WBC, UA: >50 WBC/hpf — ABNORMAL HIGH (ref 0–5)
pH: 5 (ref 5.0–8.0)

## 2019-07-30 LAB — WET PREP, GENITAL
Sperm: NONE SEEN
Trich, Wet Prep: NONE SEEN
Yeast Wet Prep HPF POC: NONE SEEN

## 2019-07-30 MED ORDER — DIPHENHYDRAMINE HCL 25 MG PO CAPS
25.0000 mg | ORAL_CAPSULE | Freq: Once | ORAL | Status: AC
  Administered 2019-07-30: 25 mg via ORAL
  Filled 2019-07-30: qty 1

## 2019-07-30 MED ORDER — AZITHROMYCIN 250 MG PO TABS
1000.0000 mg | ORAL_TABLET | Freq: Once | ORAL | Status: AC
  Administered 2019-07-30: 1000 mg via ORAL
  Filled 2019-07-30: qty 4

## 2019-07-30 MED ORDER — DEXAMETHASONE SODIUM PHOSPHATE 10 MG/ML IJ SOLN
10.0000 mg | Freq: Once | INTRAMUSCULAR | Status: AC
  Administered 2019-07-30: 10 mg via INTRAMUSCULAR
  Filled 2019-07-30: qty 1

## 2019-07-30 MED ORDER — OXYCODONE-ACETAMINOPHEN 5-325 MG PO TABS
1.0000 | ORAL_TABLET | Freq: Once | ORAL | Status: AC
  Administered 2019-07-30: 1 via ORAL
  Filled 2019-07-30: qty 1

## 2019-07-30 MED ORDER — METRONIDAZOLE 500 MG PO TABS
2000.0000 mg | ORAL_TABLET | Freq: Once | ORAL | Status: AC
  Administered 2019-07-30: 2000 mg via ORAL
  Filled 2019-07-30: qty 4

## 2019-07-30 MED ORDER — CEPHALEXIN 500 MG PO CAPS: ORAL_CAPSULE | ORAL | 0 refills | Status: DC

## 2019-07-30 MED ORDER — LIDOCAINE HCL (PF) 1 % IJ SOLN
INTRAMUSCULAR | Status: AC
  Administered 2019-07-30: 2 mL
  Filled 2019-07-30: qty 5

## 2019-07-30 MED ORDER — CEFTRIAXONE SODIUM 250 MG IJ SOLR
250.0000 mg | Freq: Once | INTRAMUSCULAR | Status: AC
  Administered 2019-07-30: 21:00:00 250 mg via INTRAMUSCULAR
  Filled 2019-07-30: qty 250

## 2019-07-30 MED ORDER — STERILE WATER FOR INJECTION IJ SOLN
INTRAMUSCULAR | Status: AC
  Administered 2019-07-30: 3 mL
  Filled 2019-07-30: qty 10

## 2019-07-30 NOTE — Discharge Instructions (Signed)
You were treated for a presumed STI. Your gonorrhea and chlamydia tests are stil pending. Do not have unprotected intercourse for the next 10 days after treatment. Please follow closely with [facility] [place] [facility] for your umbilical hernia Get help right away if: You develop sudden, severe pain near the area of your hernia. You have pain as well as nausea or vomiting. You have pain and the skin over your hernia changes color. You develop a fever.  Get help right away if you have: Severe pain in your back or your lower abdomen. A fever. Nausea or vomiting.

## 2019-07-30 NOTE — ED Triage Notes (Signed)
Pt reports hx of hernia which is causing abd pain that increases when coughing etc. Was told today about possible exposure to std and wants to be checked. Has burning pain with urination.

## 2019-07-30 NOTE — ED Notes (Signed)
Pt verbalized understanding of discharge instructions. Prescriptions and follow up care reviewed. No further questions at this time.

## 2019-07-30 NOTE — ED Provider Notes (Signed)
[facility] EMERGENCY DEPARTMENT Provider Note   CSN: [id number] Arrival date & time: 07/30/19  1349     History   Chief Complaint Chief Complaint  Patient presents with  . Hernia  . Dysuria  . Exposure to STD    HPI Dana Kennedy is a 53 y.o. female who presents with complaint of abdominal pain and dysuria in ER.  Patient has a known umbilical hernia.  She was seen for the same 2 weeks ago and had a CT scan that showed a fat-containing hernia with some inflammatory changes and no evidence of incarcerated bowel or obstruction.  Patient states that she is having significant pain in her umbilical hernia that is unchanged from her previous evaluation.  Hurts when she coughs laughs or moves is better with rest.  Patient is also complaining of pelvic pain, burning with urination, foul odor and heavy discharge.  She is concerned that her husband gave her an STI.  He is here currently being treated with penile discharge.  She denies fever or chills.     HPI  Past Medical History:  Diagnosis Date  . Anemia   . Asthma   . Chest pain    atypcial chest pain evaluation 07/10/18 Philbert Riser, Coletta Memos, MD), echo order (NL LVEF, wall motion 09/2018)  . Chronic lower back pain   . Diverticulitis   . GERD (gastroesophageal reflux disease)   . History of blood transfusion    "HgB low w/lupus flareup" (08/19/2018)  . History of ITP 2014   NOT PROBLEM NOW  . Rheumatoid arthritis ([place])    "legs; all my bones" (08/19/2018)  . SLE (systemic lupus erythematosus) ([place])   . Umbilical hernia     Patient Active Problem List   Diagnosis Date Noted  . Diverticulitis large intestine 10/24/2018  . Acute diverticulitis 08/18/2018  . Vaginal spotting 08/18/2018  . GERD (gastroesophageal reflux disease)   . Asthma   . 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 Continuous tobacco abuse 05/02/2016  . 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 Status post  tonsillectomy 05/02/2016  . Dehydration 10/12/2013  . Idiopathic thrombocytopenic purpura ([place]) 09/05/2012  . Lupus (systemic lupus erythematosus) ([place]) 09/05/2012  . Avascular necrosis of hip ([place]) 06/14/2012    Past Surgical History:  Procedure Laterality Date  . HERNIA REPAIR    . PARTIAL COLECTOMY N/A 10/24/2018   Procedure: PARTIAL COLON RESECTION WITH ANASTOMOSIS; APPENDECTOMY  ERAS PATHWAY SPLENIC FLEXURE MOBILIZATION; UMBILICAL HERNIA REPAIR;  Surgeon: Kinsinger, Arta Bruce, MD;  Location: [place];  Service: General;  Laterality: N/A;  . stomach ulcer surgery  2008  . TONSILLECTOMY    . TOTAL HIP ARTHROPLASTY  06/14/2012   Procedure: TOTAL HIP ARTHROPLASTY ANTERIOR APPROACH;  Surgeon: Mcarthur Rossetti, MD;  [facility];  Service: Orthopedics;  Laterality: Left;  Left Total Hip Arthroplasty, Anterior Approach C-Arm  . TUBAL LIGATION    . UMBILICAL HERNIA REPAIR       OB History   No obstetric history on file.      Home Medications    Prior to Admission medications   Medication Sig Start Date End Date Taking? Authorizing Provider  acetaminophen (TYLENOL) 325 MG tablet Take 2 tablets (650 mg total) by mouth every 6 (six) hours as needed for mild pain (or Fever >/= 101). 08/26/18   Roxan Hockey, MD  albuterol (PROVENTIL HFA;VENTOLIN HFA) 108 (90 Base) MCG/ACT inhaler Inhale 2 puffs into the lungs every 4 (four) hours  as needed for wheezing or shortness of breath. Patient not taking: Reported on 02/18/2019 04/14/17   Orpah Greek, MD  budesonide-formoterol [facility]) 160-4.5 MCG/ACT inhaler Inhale 2 puffs into the lungs 2 (two) times daily. 11/12/17   Mannam, Hart Robinsons, MD  chlorpheniramine (CHLOR-TRIMETON) 4 MG tablet Take 2 tablets (8 mg total) by mouth 3 (three) times daily. Patient not taking: Reported on 01/20/2019 12/25/17   Marshell Garfinkel, MD  dicyclomine (BENTYL) 20 MG tablet Take 1 tablet (20 mg total) by mouth every 8 (eight) hours as needed for spasms.  02/18/19   Davonna Belling, MD  fluticasone (FLONASE) 50 MCG/ACT nasal spray Place 2 sprays into both nostrils daily. 11/12/17   Mannam, Hart Robinsons, MD  fluticasone furoate-vilanterol (BREO ELLIPTA) 200-25 MCG/INH AEPB Inhale 1 puff into the lungs daily. 06/10/18   Mannam, Hart Robinsons, MD  ibuprofen (ADVIL,MOTRIN) 800 MG tablet Take 1 tablet (800 mg total) by mouth every 8 (eight) hours as needed. Patient not taking: Reported on 01/20/2019 10/29/18   Kinsinger, Arta Bruce, MD  metroNIDAZOLE (FLAGYL) 500 MG tablet Take 1 tablet (500 mg total) by mouth 2 (two) times daily. 07/20/19   Carmin Muskrat, MD  Nicotine 21-14-7 MG/24HR KIT Take as directed Patient not taking: Reported on 10/23/2018 03/05/18   Marshell Garfinkel, MD  oxyCODONE-acetaminophen (PERCOCET) 10-325 MG tablet Take 1 tablet by mouth every 6 (six) hours as needed for pain. 07/20/19   Carmin Muskrat, MD  pantoprazole (PROTONIX) 40 MG tablet Take 1 tablet (40 mg total) by mouth daily. 08/26/18   Roxan Hockey, MD    Family History Family History  Problem Relation Age of Onset  . Cancer Mother   . Hypertension Mother   . Heart attack Mother   . Liver disease Father   . Hypertension Brother     Social History Social History   Tobacco Use  . Smoking status: Former Smoker    Packs/day: 2.50    Years: 29.00    Pack years: 72.50    Types: Cigarettes    Quit date: 09/01/2018    Years since quitting: 0.9  . Smokeless tobacco: Never Used  Substance Use Topics  . Alcohol use: Yes    Comment: 08/19/2018 "glass of wine a couple times/year"  . Drug use: No     Allergies   Aspirin, Eszopiclone, Gabapentin, Allegra [fexofenadine], and Restoril [temazepam]   Review of Systems Review of Systems  Ten systems reviewed and are negative for acute change, except as noted in the HPI.   Physical Exam Updated Vital Signs BP (!) 126/93 (BP Location: Left Arm)   Pulse 95   Temp 99.2 F (37.3 C) (Oral)   Resp 18   LMP 02/03/2018   SpO2 97%    Physical Exam Vitals signs and nursing note reviewed.  Constitutional:      General: She is not in acute distress.    Appearance: She is well-developed. She is not diaphoretic.  HENT:     Head: Normocephalic and atraumatic.  Eyes:     General: No scleral icterus.    Conjunctiva/sclera: Conjunctivae normal.  Neck:     Musculoskeletal: Normal range of motion.  Cardiovascular:     Rate and Rhythm: Normal rate and regular rhythm.     Heart sounds: Normal heart sounds. No murmur. No friction rub. No gallop.   Pulmonary:     Effort: Pulmonary effort is normal. No respiratory distress.     Breath sounds: Normal breath sounds.  Abdominal:     General: Bowel  sounds are normal. There is no distension.     Palpations: Abdomen is soft. There is no mass.     Tenderness: There is no abdominal tenderness. There is no guarding.     Hernia: A hernia is present. Hernia is present in the umbilical area.     Comments: Tender umbilical hernia, easily   Skin:    General: Skin is warm and dry.  Neurological:     Mental Status: She is alert and oriented to person, place, and time.  Psychiatric:        Behavior: Behavior normal.      ED Treatments / Results  Labs (all labs ordered are listed, but only abnormal results are displayed) Labs Reviewed  URINALYSIS, ROUTINE W REFLEX MICROSCOPIC - Abnormal; Notable for the following components:      Result Value   APPearance CLOUDY (*)    Hgb urine dipstick MODERATE (*)    Protein, ur 100 (*)    Leukocytes,Ua LARGE (*)    WBC, UA >50 (*)    Bacteria, UA FEW (*)    All other components within normal limits  WET PREP, GENITAL  GC/CHLAMYDIA PROBE AMP ([place]) NOT AT [facility]    EKG None  Radiology No results found.  Procedures Procedures (including critical care time)  Medications Ordered in ED Medications - No data to display   Initial Impression / Assessment and Plan / ED Course  I have reviewed the triage vital signs and the  nursing notes.  Pertinent labs & imaging results that were available during my care of the patient were reviewed by me and considered in my medical decision making (see chart for details).  Clinical Course as of Jul 29 2220  Wed Jul 30, 2019  2108 Patient is having allergic reaction after administration of the medication. I have ordered decadron and benadryl    [AH]    Clinical Course User Index [AH] Margarita Mail, PA-C       Patient here with c/o abdominal hernia pain and dysuria. Her addominal hernia is soft wnd without discoloration. She has had persistent pain in her helical hernia and it does not appear to be incarcerated.  Patient had a CT scan recently that showed fat with inflammatory stranding.  Patient urine appears infected.  Wet prep shows clue cells and white blood cells.  Copious discharge noted in her husband who is also here being checked out appears to have infected UA suggestive of STI.  Patient treated here for STI with Rocephin, azithromycin, and Flagyl.  She will be discharged on Keflex. After patient received her medication she had some development of facial hives without evidence of involvement of her airway, or wheezing.  She was given IM Decadron and oral Benadryl with relief of her symptoms and watched in the emergency department for about 1 hour prior to discharge.  Discussed return precaution, safe sex practices.  Final Clinical Impressions(s) / ED Diagnoses   Final diagnoses:  None    ED Discharge Orders    None       Margarita Mail, PA-C 07/30/19 2226    Charlesetta Shanks, MD 08/01/19 1300

## 2019-08-01 LAB — GC/CHLAMYDIA PROBE AMP (~~LOC~~) NOT AT ARMC
Chlamydia: NEGATIVE
Neisseria Gonorrhea: POSITIVE — AB

## 2019-10-08 ENCOUNTER — Telehealth: Payer: Self-pay | Admitting: Hematology

## 2019-10-08 NOTE — Telephone Encounter (Signed)
Returned patient's phone call regarding rescheduling an appointment, per patient's request 06/22 appointment has moved to 01/25.

## 2019-10-13 ENCOUNTER — Inpatient Hospital Stay: Payer: Medicaid Other | Attending: Hematology

## 2019-10-13 ENCOUNTER — Inpatient Hospital Stay: Payer: Medicaid Other | Admitting: Hematology

## 2019-10-13 ENCOUNTER — Other Ambulatory Visit: Payer: Self-pay | Admitting: *Deleted

## 2019-10-13 DIAGNOSIS — D693 Immune thrombocytopenic purpura: Secondary | ICD-10-CM

## 2019-10-16 ENCOUNTER — Telehealth: Payer: Self-pay | Admitting: Hematology

## 2019-10-16 NOTE — Telephone Encounter (Signed)
Called pt per 1/25 sch msg - no answer and vmail full. Message sent to RN

## 2019-12-29 ENCOUNTER — Emergency Department (HOSPITAL_COMMUNITY): Payer: Medicaid Other

## 2019-12-29 ENCOUNTER — Encounter (HOSPITAL_COMMUNITY): Payer: Self-pay | Admitting: Emergency Medicine

## 2019-12-29 ENCOUNTER — Emergency Department (HOSPITAL_COMMUNITY)
Admission: EM | Admit: 2019-12-29 | Discharge: 2019-12-30 | Disposition: A | Discharge status: Home / Self Care | Payer: Medicaid Other | Attending: Emergency Medicine | Admitting: Emergency Medicine

## 2019-12-29 DIAGNOSIS — R0602 Shortness of breath: Secondary | ICD-10-CM | POA: Diagnosis present

## 2019-12-29 DIAGNOSIS — B9689 Other specified bacterial agents as the cause of diseases classified elsewhere: Secondary | ICD-10-CM | POA: Insufficient documentation

## 2019-12-29 DIAGNOSIS — Z20822 Contact with and (suspected) exposure to covid-19: Secondary | ICD-10-CM | POA: Diagnosis not present

## 2019-12-29 DIAGNOSIS — J4541 Moderate persistent asthma with (acute) exacerbation: Secondary | ICD-10-CM | POA: Diagnosis not present

## 2019-12-29 DIAGNOSIS — N760 Acute vaginitis: Secondary | ICD-10-CM | POA: Insufficient documentation

## 2019-12-29 LAB — COMPREHENSIVE METABOLIC PANEL
ALT: 15 U/L (ref 0–44)
AST: 15 U/L (ref 15–41)
Albumin: 3.7 g/dL (ref 3.5–5.0)
Alkaline Phosphatase: 92 U/L (ref 38–126)
Anion gap: 12 (ref 5–15)
BUN: 5 mg/dL — ABNORMAL LOW (ref 6–20)
CO2: 26 mmol/L (ref 22–32)
Calcium: 9.7 mg/dL (ref 8.9–10.3)
Chloride: 103 mmol/L (ref 98–111)
Creatinine, Ser: 0.76 mg/dL (ref 0.44–1.00)
GFR calc Af Amer: >60 mL/min (ref >60)
GFR calc non Af Amer: >60 mL/min (ref >60)
Glucose, Bld: 103 mg/dL — ABNORMAL HIGH (ref 70–99)
Potassium: 3.7 mmol/L (ref 3.5–5.1)
Sodium: 141 mmol/L (ref 135–145)
Total Bilirubin: 0.6 mg/dL (ref 0.3–1.2)
Total Protein: 6.7 g/dL (ref 6.5–8.1)

## 2019-12-29 LAB — CBC
HCT: 44.9 % (ref 36.0–46.0)
Hemoglobin: 14.4 g/dL (ref 12.0–15.0)
MCH: 27.4 pg (ref 26.0–34.0)
MCHC: 32.1 g/dL (ref 30.0–36.0)
MCV: 85.4 fL (ref 80.0–100.0)
Platelets: 404 10*3/uL — ABNORMAL HIGH (ref 150–400)
RBC: 5.26 MIL/uL — ABNORMAL HIGH (ref 3.87–5.11)
RDW: 14.6 % (ref 11.5–15.5)
WBC: 7.5 10*3/uL (ref 4.0–10.5)
nRBC: 0 % (ref 0.0–0.2)

## 2019-12-29 LAB — POC SARS CORONAVIRUS 2 AG -  ED: SARS Coronavirus 2 Ag: NEGATIVE

## 2019-12-29 NOTE — ED Triage Notes (Signed)
Pt in POV. Reports SOB, cough, subjective fever, chills/body aches since Friday. Pt states she thinks she has COVID. Afebrile in triage. Sats 96% RA.

## 2019-12-30 ENCOUNTER — Other Ambulatory Visit: Payer: Self-pay

## 2019-12-30 ENCOUNTER — Telehealth (HOSPITAL_COMMUNITY): Payer: Self-pay | Admitting: Physician Assistant

## 2019-12-30 LAB — URINALYSIS, ROUTINE W REFLEX MICROSCOPIC
Bilirubin Urine: NEGATIVE
Glucose, UA: NEGATIVE mg/dL
Ketones, ur: NEGATIVE mg/dL
Nitrite: NEGATIVE
Protein, ur: NEGATIVE mg/dL
Specific Gravity, Urine: 1.012 (ref 1.005–1.030)
WBC, UA: >50 WBC/hpf — ABNORMAL HIGH (ref 0–5)
pH: 6 (ref 5.0–8.0)

## 2019-12-30 LAB — WET PREP, GENITAL
Sperm: NONE SEEN
Trich, Wet Prep: NONE SEEN
Yeast Wet Prep HPF POC: NONE SEEN

## 2019-12-30 LAB — HIV ANTIBODY (ROUTINE TESTING W REFLEX): HIV Screen 4th Generation wRfx: NONREACTIVE

## 2019-12-30 LAB — RPR: RPR Ser Ql: NONREACTIVE

## 2019-12-30 LAB — SARS CORONAVIRUS 2 (TAT 6-24 HRS): SARS Coronavirus 2: NEGATIVE

## 2019-12-30 MED ORDER — AZITHROMYCIN 250 MG PO TABS
1000.0000 mg | ORAL_TABLET | Freq: Once | ORAL | Status: AC
  Administered 2019-12-30: 1000 mg via ORAL
  Filled 2019-12-30: qty 4

## 2019-12-30 MED ORDER — PREDNISONE 20 MG PO TABS
60.0000 mg | ORAL_TABLET | Freq: Once | ORAL | Status: AC
  Administered 2019-12-30: 60 mg via ORAL
  Filled 2019-12-30: qty 3

## 2019-12-30 MED ORDER — METRONIDAZOLE 500 MG PO TABS: 500.0000 mg | ORAL_TABLET | Freq: Two times a day (BID) | ORAL | 0 refills | Status: DC

## 2019-12-30 MED ORDER — AZITHROMYCIN 250 MG PO TABS: ORAL_TABLET | ORAL | 0 refills | Status: DC

## 2019-12-30 MED ORDER — AMOXICILLIN-POT CLAVULANATE 875-125 MG PO TABS: 1.0000 | ORAL_TABLET | Freq: Two times a day (BID) | ORAL | 0 refills | Status: DC

## 2019-12-30 MED ORDER — IPRATROPIUM BROMIDE HFA 17 MCG/ACT IN AERS
2.0000 | INHALATION_SPRAY | Freq: Once | RESPIRATORY_TRACT | Status: AC
  Administered 2019-12-30: 2 via RESPIRATORY_TRACT
  Filled 2019-12-30: qty 12.9

## 2019-12-30 MED ORDER — PREDNISONE 20 MG PO TABS: 20.0000 mg | ORAL_TABLET | Freq: Every day | ORAL | 0 refills | Status: DC

## 2019-12-30 MED ORDER — ALBUTEROL SULFATE HFA 108 (90 BASE) MCG/ACT IN AERS: 2.0000 | INHALATION_SPRAY | RESPIRATORY_TRACT | 0 refills | Status: DC | PRN

## 2019-12-30 MED ORDER — CEFTRIAXONE SODIUM 500 MG IJ SOLR
500.0000 mg | Freq: Once | INTRAMUSCULAR | Status: AC
  Administered 2019-12-30: 500 mg via INTRAMUSCULAR
  Filled 2019-12-30: qty 500

## 2019-12-30 MED ORDER — ALBUTEROL SULFATE HFA 108 (90 BASE) MCG/ACT IN AERS
4.0000 | INHALATION_SPRAY | Freq: Once | RESPIRATORY_TRACT | Status: AC
  Administered 2019-12-30: 4 via RESPIRATORY_TRACT
  Filled 2019-12-30: qty 6.7

## 2019-12-30 NOTE — Discharge Instructions (Signed)
You have been seen today for asthma exacerbation vs covid. Please read and follow all provided instructions. Return to the emergency room for worsening condition or new concerning symptoms.    The xray shows you possibly have pneumonia. Your Covid test is still pending at this time.  If it is positive you will receive a phone call, it is negative it will be available for you to see in your MyChart.  If the test ends up being positive you should continue symptomatic treatment including Tylenol, ibuprofen, over-the-counter cough medicine  1. Medications:  -Prescription to your pharmacy for 2 antibiotics used to treat pneumonia.  These are Augmentin and azithromycin.  Please take as prescribed. -Prescription also sent for prednisone.  This is a steroid use to help with asthma.  Please take as prescribed. -Refill printed for your albuterol inhaler.  Continue usual home medications Take medications as prescribed. Please review all of the medicines and only take them if you do not have an allergy to them.   2. Treatment: rest, drink plenty of fluids  3. Follow Up:  Please follow up with primary care provider by scheduling an appointment as soon as possible for a visit     It is also a possibility that you have an allergic reaction to any of the medicines that you have been prescribed - Everybody reacts differently to medications and while MOST people have no trouble with most medicines, you may have a reaction such as nausea, vomiting, rash, swelling, shortness of breath. If this is the case, please stop taking the medicine immediately and contact your physician.  ?

## 2019-12-30 NOTE — ED Notes (Signed)
Pt on the phone cursing about staff members calling them names such as "bitch" due to the wait time. Pt stating that staff "genuinely do not understand how ridiculous this is" and that I what is making her mad.

## 2019-12-30 NOTE — Telephone Encounter (Cosign Needed)
I saw patient earlier today in [facility] ED. Upon further chart review after discharge I accidentally forgot to prescribe her flagyl for her bacterial vaginosis. I sent prescription to her pharmacy and notified her by phone to let her know of the prescription. I advised patient not to drink while taking this medication as well. Advised patient to follow up with pcp as we discussed earlier during ED visit to ensure her symptoms improve.

## 2019-12-30 NOTE — ED Notes (Signed)
Pt ambulated on pulse ox, SpO2 remaining 95% and above.

## 2019-12-30 NOTE — ED Provider Notes (Signed)
[facility] EMERGENCY DEPARTMENT Provider Note   CSN: [id number] Arrival date & time: 12/29/19  1458     History Chief Complaint  Patient presents with  . Shortness of Breath  . Vaginal Discharge    Dana Kennedy is a 54 y.o. female last medical history significant for anemia, asthma, rheumatoid arthritis, SLE, tobacco abuse presents to emergency department today with chief complaint of shortness of breath and vaginal discharge x 4 days. Patient states she has been out of her albuterol inhaler for a long time.  She is having intermittent wheezing and shortness of breath with exertion.  She is also endorsing productive cough with yellow phlegm.  She has tried taking over-the-counter allergy medication without symptom relief. Patient describes vaginal discharge as malodorous yellow and thin.  She states she has a history of STDs in the recent past.  She is also endorsing burning with urination.  She is only sexually active with 1 female partner without protection. She denies fever, chills, congestion, sore throat, chest pain, abdominal pain, nausea, emesis, gross hematuria, pelvic pain, back pain, abnormal vaginal bleeding, rash, sense of taste or smell.  She denies any sick contacts.  History provided by patient with additional history obtained from chart review.     Past Medical History:  Diagnosis Date  . Anemia   . Asthma   . Chest pain    atypcial chest pain evaluation 07/10/18 Philbert Riser, Coletta Memos, MD), echo order (NL LVEF, wall motion 09/2018)  . Chronic lower back pain   . Diverticulitis   . GERD (gastroesophageal reflux disease)   . History of blood transfusion    "HgB low w/lupus flareup" (08/19/2018)  . History of ITP 2014   NOT PROBLEM NOW  . Rheumatoid arthritis ([place])    "legs; all my bones" (08/19/2018)  . SLE (systemic lupus erythematosus) ([place])   . Umbilical hernia     Patient Active Problem List   Diagnosis Date Noted  . Diverticulitis  large intestine 10/24/2018  . Acute diverticulitis 08/18/2018  . Vaginal spotting 08/18/2018  . GERD (gastroesophageal reflux disease)   . Asthma   . 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 Continuous tobacco abuse 05/02/2016  . 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 Status post tonsillectomy 05/02/2016  . Dehydration 10/12/2013  . Idiopathic thrombocytopenic purpura ([place]) 09/05/2012  . Lupus (systemic lupus erythematosus) ([place]) 09/05/2012  . Avascular necrosis of hip ([place]) 06/14/2012    Past Surgical History:  Procedure Laterality Date  . HERNIA REPAIR    . PARTIAL COLECTOMY N/A 10/24/2018   Procedure: PARTIAL COLON RESECTION WITH ANASTOMOSIS; APPENDECTOMY  ERAS PATHWAY SPLENIC FLEXURE MOBILIZATION; UMBILICAL HERNIA REPAIR;  Surgeon: Kinsinger, Arta Bruce, MD;  Location: [facility];  Service: General;  Laterality: N/A;  . stomach ulcer surgery  2008  . TONSILLECTOMY    . TOTAL HIP ARTHROPLASTY  06/14/2012   Procedure: TOTAL HIP ARTHROPLASTY ANTERIOR APPROACH;  Surgeon: Mcarthur Rossetti, MD;  Location: [facility];  Service: Orthopedics;  Laterality: Left;  Left Total Hip Arthroplasty, Anterior Approach C-Arm  . TUBAL LIGATION    . UMBILICAL HERNIA REPAIR       OB History   No obstetric history on file.     Family History  Problem Relation Age of Onset  . Cancer Mother   . Hypertension Mother   . Heart attack Mother   . Liver disease Father   . Hypertension Brother     Social History   Tobacco  Use  . Smoking status: Current Every Day Smoker    Packs/day: 0.50    Types: Cigarettes    Last attempt to quit: 09/01/2018    Years since quitting: 1.3  . Smokeless tobacco: Never Used  Substance Use Topics  . Alcohol use: Yes    Comment: 08/19/2018 "glass of wine a couple times/year"  . Drug use: No    Home Medications Prior to Admission medications   Medication Sig Start Date End Date Taking? Authorizing Provider  acetaminophen (TYLENOL) 325  MG tablet Take 2 tablets (650 mg total) by mouth every 6 (six) hours as needed for mild pain (or Fever >/= 101). 08/26/18   Roxan Hockey, MD  albuterol (VENTOLIN HFA) 108 (90 Base) MCG/ACT inhaler Inhale 2 puffs into the lungs every 4 (four) hours as needed for wheezing or shortness of breath. 12/30/19   Scotti Kosta E, PA-C  amoxicillin-clavulanate (AUGMENTIN) 875-125 MG tablet Take 1 tablet by mouth every 12 (twelve) hours. 12/30/19   Loralyn Rachel E, PA-C  azithromycin (ZITHROMAX Z-PAK) 250 MG tablet Take 2 tablets (500 mg total) by mouth daily for 1 day, THEN 1 tablet (250 mg total) daily for 4 days. 12/31/19 01/05/20  Lillian Ballester E, PA-C  budesonide-formoterol (SYMBICORT) 160-4.5 MCG/ACT inhaler Inhale 2 puffs into the lungs 2 (two) times daily. 11/12/17   Mannam, Hart Robinsons, MD  cephALEXin (KEFLEX) 500 MG capsule 2 caps po bid x 7 days 07/30/19   Margarita Mail, PA-C  chlorpheniramine (CHLOR-TRIMETON) 4 MG tablet Take 2 tablets (8 mg total) by mouth 3 (three) times daily. 12/25/17   Mannam, Hart Robinsons, MD  dicyclomine (BENTYL) 20 MG tablet Take 1 tablet (20 mg total) by mouth every 8 (eight) hours as needed for spasms. 02/18/19   Davonna Belling, MD  fluticasone (FLONASE) 50 MCG/ACT nasal spray Place 2 sprays into both nostrils daily. 11/12/17   Mannam, Hart Robinsons, MD  fluticasone furoate-vilanterol (BREO ELLIPTA) 200-25 MCG/INH AEPB Inhale 1 puff into the lungs daily. 06/10/18   Mannam, Hart Robinsons, MD  ibuprofen (ADVIL,MOTRIN) 800 MG tablet Take 1 tablet (800 mg total) by mouth every 8 (eight) hours as needed. 10/29/18   Kinsinger, Arta Bruce, MD  metroNIDAZOLE (FLAGYL) 500 MG tablet Take 1 tablet (500 mg total) by mouth 2 (two) times daily. 07/20/19   Carmin Muskrat, MD  Nicotine 21-14-7 MG/24HR KIT Take as directed 03/05/18   Marshell Garfinkel, MD  oxyCODONE-acetaminophen (PERCOCET) 10-325 MG tablet Take 1 tablet by mouth every 6 (six) hours as needed for pain. 07/20/19   Carmin Muskrat, MD    pantoprazole (PROTONIX) 40 MG tablet Take 1 tablet (40 mg total) by mouth daily. 08/26/18   Roxan Hockey, MD  predniSONE (DELTASONE) 20 MG tablet Take 1 tablet (20 mg total) by mouth daily for 4 days. 12/31/19 01/04/20  Sahith Nurse E, PA-C    Allergies    Aspirin, Eszopiclone, Gabapentin, Allegra [fexofenadine], and Restoril [temazepam]  Review of Systems   Review of Systems  All other systems are reviewed and are negative for acute change except as noted in the HPI.  Physical Exam Updated Vital Signs BP 118/82 (BP Location: Right Arm)   Pulse 97   Temp 98.3 F (36.8 C) (Oral)   Resp 20   Ht '5\' 5"'  (1.651 m)   Wt 97.5 kg   LMP 02/03/2018   SpO2 98%   BMI 35.78 kg/m   Physical Exam Vitals and nursing note reviewed.  Constitutional:      General: She is not in acute  distress.    Appearance: She is not ill-appearing.  HENT:     Head: Normocephalic and atraumatic.     Right Ear: Tympanic membrane and external ear normal.     Left Ear: Tympanic membrane and external ear normal.     Nose: Nose normal.     Mouth/Throat:     Mouth: Mucous membranes are moist.     Pharynx: Oropharynx is clear.  Eyes:     General: No scleral icterus.       Right eye: No discharge.        Left eye: No discharge.     Extraocular Movements: Extraocular movements intact.     Conjunctiva/sclera: Conjunctivae normal.     Pupils: Pupils are equal, round, and reactive to light.  Neck:     Vascular: No JVD.  Cardiovascular:     Rate and Rhythm: Normal rate and regular rhythm.     Pulses: Normal pulses.          Radial pulses are 2+ on the right side and 2+ on the left side.     Heart sounds: Normal heart sounds.  Pulmonary:     Comments: Wheezing heard in all lung fields. Symmetric chest rise. No rales, or rhonchi.  Oxygen saturation is 97% on room air.  Normal work of breathing.  She is speaking in full sentences. Abdominal:     Tenderness: There is no right CVA tenderness or left CVA  tenderness.     Comments: Abdomen is soft, non-distended, and non-tender in all quadrants. No rigidity, no guarding. No peritoneal signs.  Genitourinary:    Comments: Normal external genitalia. No pain with speculum insertion. Closed cervical os with normal appearance - no rash or lesions. No significant discharge or bleeding. Thin green discharge from cervix. On bimanual examination no adnexal tenderness or cervical motion tenderness. Chaperone Magda Paganini RN present during exam.  Musculoskeletal:        General: Normal range of motion.     Cervical back: Normal range of motion.     Right lower leg: No edema.     Left lower leg: No edema.  Skin:    General: Skin is warm and dry.     Capillary Refill: Capillary refill takes less than 2 seconds.  Neurological:     Mental Status: She is oriented to person, place, and time.     GCS: GCS eye subscore is 4. GCS verbal subscore is 5. GCS motor subscore is 6.     Comments: Fluent speech, no facial droop.  Psychiatric:        Behavior: Behavior normal.       ED Results / Procedures / Treatments   Labs (all labs ordered are listed, but only abnormal results are displayed) Labs Reviewed  WET PREP, GENITAL - Abnormal; Notable for the following components:      Result Value   Clue Cells Wet Prep HPF POC PRESENT (*)    WBC, Wet Prep HPF POC MANY (*)    All other components within normal limits  CBC - Abnormal; Notable for the following components:   RBC 5.26 (*)    Platelets 404 (*)    All other components within normal limits  COMPREHENSIVE METABOLIC PANEL - Abnormal; Notable for the following components:   Glucose, Bld 103 (*)    BUN 5 (*)    All other components within normal limits  URINALYSIS, ROUTINE W REFLEX MICROSCOPIC - Abnormal; Notable for the following components:   APPearance HAZY (*)  Hgb urine dipstick MODERATE (*)    Leukocytes,Ua LARGE (*)    WBC, UA >50 (*)    Bacteria, UA RARE (*)    All other components within normal  limits  URINE CULTURE  SARS CORONAVIRUS 2 (TAT 6-24 HRS)  HIV ANTIBODY (ROUTINE TESTING W REFLEX)  RPR  POC SARS CORONAVIRUS 2 AG -  ED  GC/CHLAMYDIA PROBE AMP ([place]) NOT AT [facility]    EKG EKG Interpretation  Date/Time:  Monday December 29 2019 15:18:07 EDT Ventricular Rate:  103 PR Interval:  166 QRS Duration: 74 QT Interval:  344 QTC Calculation: 450 R Axis:   5 Text Interpretation: Sinus tachycardia Confirmed by Dory Horn) on 12/30/2019 5:53:55 AM   Radiology DG Chest 2 View  Result Date: 12/29/2019 CLINICAL DATA:  Shortness of breath for 1 day, history of asthma and GERD, current smoker EXAM: CHEST - 2 VIEW COMPARISON:  Radiograph 12/25/2017 FINDINGS: Some increasing patchy and interstitial opacities noted towards the lung bases and periphery. No pneumothorax or effusion. Cardiomediastinal contours are similar to prior exams accounting for differences in technique. No acute osseous or soft tissue abnormality. IMPRESSION: Increasing patchy and interstitial opacities towards the lung bases and periphery. Findings could represent developing infection or edema. Electronically Signed   By: Lovena Le M.D.   On: 12/29/2019 22:15    Procedures Procedures (including critical care time)  Medications Ordered in ED Medications  predniSONE (DELTASONE) tablet 60 mg (60 mg Oral Given 12/30/19 0723)  albuterol (VENTOLIN HFA) 108 (90 Base) MCG/ACT inhaler 4-6 puff (4 puffs Inhalation Given 12/30/19 0725)  ipratropium (ATROVENT HFA) inhaler 2 puff (2 puffs Inhalation Given 12/30/19 0727)  cefTRIAXone (ROCEPHIN) injection 500 mg (500 mg Intramuscular Given 12/30/19 0957)  azithromycin (ZITHROMAX) tablet 1,000 mg (1,000 mg Oral Given 12/30/19 4496)    ED Course  I have reviewed the triage vital signs and the nursing notes.  Pertinent labs & imaging results that were available during my care of the patient were reviewed by me and considered in my medical decision making (see chart  for details).    MDM Rules/Calculators/A&P                      Patient seen and examined. Patient presents awake, alert, hemodynamically stable, afebrile, non toxic.  In triage patient was noted to be tachycardic to 102 and tachypneic.  No hypoxia.  On my exam heart rate ranging in the 90s and no tachypnea, normal work of breathing.  She does have wheezing heard in all lung fields.  No abdominal tenderness, no peritoneal signs. DDx includes Covid, pneumonia, asthma exacerbation, PE, UTI, STD, less likely pyelonephritis.  Labs elected in triage.  I viewed results.  No leukocytosis, no anemia, no severe electrolyte derangement, no renal insufficiency, normal liver enzymes.  Rapid point-of-care Covid test is negative. Covid PCR test performed and patient aware she needs to quarantine until she has the result. Chest x-ray viewed by me shows patchy opacities in bilateral lung bases and periphery with concern for infection.  EKG shows sinus tachycardia, no ischemic changes.  Pelvic exam performed with RN chaperone and shows thin discharge from cervix.  No adnexal or cervical motion tenderness. Wet prep shows clue cells and WBC, negative for trichomonas or yeast.  We will treat prophylactically for gonorrhea and chlamydia.  Patient aware she has pending test including HIV and syphilis and that she will be contacted if results are positive.  Patient given albuterol and prednisone.  On reassessment lungs sound much improved, wheezing no longer heard. Patient does have dry hacking coughing during my initial exam and when reassessed multiple times. Her heart rate while I am in the room ranges consistently from 90-101. She has documented vitals of heart rate 121 after breathing treatment and while coughing. When she is not coughing her respiratory rate is normal. UA shows large leukocyte, over 50 WBC, will send for urine culture and hold off on antibiotics at this time as patient is already being prescribed  multiple antibiotics for pneumonia and STD treatment to make sure she was prescribed appropriate UTI antibiotic.  Patient ambulated in the emergency department without respiratory distress, tachycardia, or hypoxia. SpO2 during ambulation >94% on room air. PSI/PORT score 42 making outpatient treatment reasonable. Will treat with z pack and augmentin given her comorbidities incase this is strictly pneumonia without covid. Will also discharge with prescription for short steroid burst to cover for asthma exacerbation. Patient is eager to be discharged home and feels she can manage her symptoms with antibiotics and OTC treatments.  The patient appears reasonably screened and/or stabilized for discharge and I doubt any other medical condition or other [facility] requiring further screening, evaluation, or treatment in the ED at this time prior to discharge. The patient is safe for discharge with strict return precautions discussed. Recommend pcp follow up if symptoms persist. Findings and plan of care discussed with supervising physician Dr. Tyrone Nine.   Dana Kennedy was evaluated in Emergency Department on 12/30/2019 for the symptoms described in the history of present illness. She was evaluated in the context of the global COVID-19 pandemic, which necessitated consideration that the patient might be at risk for infection with the SARS-CoV-2 virus that causes COVID-19. Institutional protocols and algorithms that pertain to the evaluation of patients at risk for COVID-19 are in a state of rapid change based on information released by regulatory bodies including the CDC and federal and state organizations. These policies and algorithms were followed during the patient's care in the ED.   Portions of this note were generated with [employer]. Dictation errors may occur despite best attempts at proofreading.   Final Clinical Impression(s) / ED Diagnoses Final diagnoses:  Suspected COVID-19 virus  infection  Moderate persistent asthma with exacerbation  Bacterial vaginosis    Rx / DC Orders ED Discharge Orders         Ordered    azithromycin (ZITHROMAX Z-PAK) 250 MG tablet     12/30/19 1023    predniSONE (DELTASONE) 20 MG tablet  Daily     12/30/19 1023    amoxicillin-clavulanate (AUGMENTIN) 875-125 MG tablet  Every 12 hours     12/30/19 1023    albuterol (VENTOLIN HFA) 108 (90 Base) MCG/ACT inhaler  Every 4 hours PRN     12/30/19 1023           Vollie Aaron, [place], PA-C 12/30/19 [place], [place], DO 12/30/19 1038

## 2019-12-30 NOTE — ED Notes (Signed)
Pt states she would like to be tested for STDs.

## 2019-12-30 NOTE — ED Notes (Signed)
Pt discharge instructions and prescriptions reviewed with the patient. The patient verbalized understanding of both. Pt discharged. 

## 2019-12-31 ENCOUNTER — Inpatient Hospital Stay (HOSPITAL_COMMUNITY)
Admission: EM | Admit: 2019-12-31 | Discharge: 2020-01-02 | DRG: 202 | Disposition: A | Discharge status: Home / Self Care | Payer: Medicaid Other | Attending: Family Medicine | Admitting: Family Medicine

## 2019-12-31 ENCOUNTER — Encounter (HOSPITAL_COMMUNITY): Payer: Self-pay

## 2019-12-31 ENCOUNTER — Emergency Department (HOSPITAL_COMMUNITY): Payer: Medicaid Other

## 2019-12-31 ENCOUNTER — Other Ambulatory Visit: Payer: Self-pay

## 2019-12-31 DIAGNOSIS — Z720 Tobacco use: Secondary | ICD-10-CM

## 2019-12-31 DIAGNOSIS — I160 Hypertensive urgency: Secondary | ICD-10-CM | POA: Diagnosis present

## 2019-12-31 DIAGNOSIS — M069 Rheumatoid arthritis, unspecified: Secondary | ICD-10-CM | POA: Diagnosis present

## 2019-12-31 DIAGNOSIS — J189 Pneumonia, unspecified organism: Secondary | ICD-10-CM | POA: Diagnosis not present

## 2019-12-31 DIAGNOSIS — Z862 Personal history of diseases of the blood and blood-forming organs and certain disorders involving the immune mechanism: Secondary | ICD-10-CM

## 2019-12-31 DIAGNOSIS — N760 Acute vaginitis: Secondary | ICD-10-CM | POA: Diagnosis not present

## 2019-12-31 DIAGNOSIS — Z20822 Contact with and (suspected) exposure to covid-19: Secondary | ICD-10-CM | POA: Diagnosis present

## 2019-12-31 DIAGNOSIS — Z886 Allergy status to analgesic agent status: Secondary | ICD-10-CM

## 2019-12-31 DIAGNOSIS — Z96642 Presence of left artificial hip joint: Secondary | ICD-10-CM | POA: Diagnosis present

## 2019-12-31 DIAGNOSIS — N390 Urinary tract infection, site not specified: Secondary | ICD-10-CM | POA: Diagnosis not present

## 2019-12-31 DIAGNOSIS — Z7951 Long term (current) use of inhaled steroids: Secondary | ICD-10-CM

## 2019-12-31 DIAGNOSIS — J4541 Moderate persistent asthma with (acute) exacerbation: Secondary | ICD-10-CM

## 2019-12-31 DIAGNOSIS — Z9049 Acquired absence of other specified parts of digestive tract: Secondary | ICD-10-CM

## 2019-12-31 DIAGNOSIS — Z791 Long term (current) use of non-steroidal anti-inflammatories (NSAID): Secondary | ICD-10-CM

## 2019-12-31 DIAGNOSIS — B9689 Other specified bacterial agents as the cause of diseases classified elsewhere: Secondary | ICD-10-CM

## 2019-12-31 DIAGNOSIS — M329 Systemic lupus erythematosus, unspecified: Secondary | ICD-10-CM | POA: Diagnosis present

## 2019-12-31 DIAGNOSIS — K219 Gastro-esophageal reflux disease without esophagitis: Secondary | ICD-10-CM | POA: Diagnosis present

## 2019-12-31 DIAGNOSIS — J45901 Unspecified asthma with (acute) exacerbation: Principal | ICD-10-CM | POA: Diagnosis present

## 2019-12-31 DIAGNOSIS — Z8249 Family history of ischemic heart disease and other diseases of the circulatory system: Secondary | ICD-10-CM

## 2019-12-31 DIAGNOSIS — J45909 Unspecified asthma, uncomplicated: Secondary | ICD-10-CM | POA: Diagnosis present

## 2019-12-31 DIAGNOSIS — R42 Dizziness and giddiness: Secondary | ICD-10-CM | POA: Diagnosis present

## 2019-12-31 DIAGNOSIS — Z79891 Long term (current) use of opiate analgesic: Secondary | ICD-10-CM

## 2019-12-31 DIAGNOSIS — F17210 Nicotine dependence, cigarettes, uncomplicated: Secondary | ICD-10-CM | POA: Diagnosis present

## 2019-12-31 DIAGNOSIS — Z888 Allergy status to other drugs, medicaments and biological substances status: Secondary | ICD-10-CM

## 2019-12-31 DIAGNOSIS — Z79899 Other long term (current) drug therapy: Secondary | ICD-10-CM

## 2019-12-31 LAB — BASIC METABOLIC PANEL
Anion gap: 12 (ref 5–15)
BUN: 15 mg/dL (ref 6–20)
CO2: 24 mmol/L (ref 22–32)
Calcium: 9.6 mg/dL (ref 8.9–10.3)
Chloride: 105 mmol/L (ref 98–111)
Creatinine, Ser: 0.79 mg/dL (ref 0.44–1.00)
GFR calc Af Amer: >60 mL/min (ref >60)
GFR calc non Af Amer: >60 mL/min (ref >60)
Glucose, Bld: 116 mg/dL — ABNORMAL HIGH (ref 70–99)
Potassium: 4 mmol/L (ref 3.5–5.1)
Sodium: 141 mmol/L (ref 135–145)

## 2019-12-31 LAB — CBC
HCT: 43.5 % (ref 36.0–46.0)
Hemoglobin: 14.1 g/dL (ref 12.0–15.0)
MCH: 27.6 pg (ref 26.0–34.0)
MCHC: 32.4 g/dL (ref 30.0–36.0)
MCV: 85.1 fL (ref 80.0–100.0)
Platelets: 380 10*3/uL (ref 150–400)
RBC: 5.11 MIL/uL (ref 3.87–5.11)
RDW: 14.6 % (ref 11.5–15.5)
WBC: 11.9 10*3/uL — ABNORMAL HIGH (ref 4.0–10.5)
nRBC: 0 % (ref 0.0–0.2)

## 2019-12-31 LAB — URINE CULTURE

## 2019-12-31 LAB — I-STAT BETA HCG BLOOD, ED (MC, WL, AP ONLY): I-stat hCG, quantitative: <5 m[IU]/mL (ref <5)

## 2019-12-31 LAB — GC/CHLAMYDIA PROBE AMP (~~LOC~~) NOT AT ARMC
Chlamydia: NEGATIVE
Comment: NEGATIVE
Comment: NORMAL
Neisseria Gonorrhea: NEGATIVE

## 2019-12-31 LAB — TROPONIN I (HIGH SENSITIVITY)
Troponin I (High Sensitivity): <2 ng/L (ref <18)
Troponin I (High Sensitivity): <2 ng/L (ref <18)

## 2019-12-31 LAB — D-DIMER, QUANTITATIVE: D-Dimer, Quant: 0.36 ug/mL-FEU (ref 0.00–0.50)

## 2019-12-31 MED ORDER — SODIUM CHLORIDE 0.9 % IV SOLN
INTRAVENOUS | Status: DC | PRN
  Administered 2019-12-31: 10 mL/h via INTRAVENOUS

## 2019-12-31 MED ORDER — OXYCODONE-ACETAMINOPHEN 10-325 MG PO TABS: 1.0000 | ORAL_TABLET | Freq: Four times a day (QID) | ORAL | Status: DC | PRN

## 2019-12-31 MED ORDER — MAGNESIUM SULFATE IN D5W 1-5 GM/100ML-% IV SOLN
1.0000 g | Freq: Once | INTRAVENOUS | Status: AC
  Administered 2019-12-31: 1 g via INTRAVENOUS
  Filled 2019-12-31: qty 100

## 2019-12-31 MED ORDER — FLUTICASONE PROPIONATE 50 MCG/ACT NA SUSP
2.0000 | Freq: Every day | NASAL | Status: DC
  Filled 2019-12-31: qty 16

## 2019-12-31 MED ORDER — BACLOFEN 10 MG PO TABS: 10.0000 mg | ORAL_TABLET | Freq: Three times a day (TID) | ORAL | Status: DC | PRN

## 2019-12-31 MED ORDER — FLUTICASONE FUROATE-VILANTEROL 200-25 MCG/INH IN AEPB
1.0000 | INHALATION_SPRAY | Freq: Every day | RESPIRATORY_TRACT | Status: DC
  Administered 2020-01-01 – 2020-01-02 (×2): 1 via RESPIRATORY_TRACT
  Filled 2019-12-31: qty 28

## 2019-12-31 MED ORDER — ONDANSETRON HCL 4 MG/2ML IJ SOLN: 4.0000 mg | Freq: Four times a day (QID) | INTRAMUSCULAR | Status: DC | PRN

## 2019-12-31 MED ORDER — SODIUM CHLORIDE 0.9% FLUSH
3.0000 mL | Freq: Once | INTRAVENOUS | Status: AC
  Administered 2019-12-31: 3 mL via INTRAVENOUS

## 2019-12-31 MED ORDER — ALBUTEROL SULFATE HFA 108 (90 BASE) MCG/ACT IN AERS
6.0000 | INHALATION_SPRAY | Freq: Once | RESPIRATORY_TRACT | Status: AC
  Administered 2019-12-31: 6 via RESPIRATORY_TRACT
  Filled 2019-12-31: qty 6.7

## 2019-12-31 MED ORDER — MAGNESIUM SULFATE 50 % IJ SOLN: 1.0000 g | Freq: Once | INTRAMUSCULAR | Status: DC

## 2019-12-31 MED ORDER — MOMETASONE FURO-FORMOTEROL FUM 200-5 MCG/ACT IN AERO
2.0000 | INHALATION_SPRAY | Freq: Two times a day (BID) | RESPIRATORY_TRACT | Status: DC
  Administered 2020-01-01 – 2020-01-02 (×3): 2 via RESPIRATORY_TRACT
  Filled 2019-12-31: qty 8.8

## 2019-12-31 MED ORDER — ALBUTEROL SULFATE (2.5 MG/3ML) 0.083% IN NEBU: 2.5000 mg | INHALATION_SOLUTION | RESPIRATORY_TRACT | Status: DC | PRN

## 2019-12-31 MED ORDER — OXYCODONE-ACETAMINOPHEN 5-325 MG PO TABS
1.0000 | ORAL_TABLET | Freq: Four times a day (QID) | ORAL | Status: DC | PRN
  Administered 2019-12-31 – 2020-01-02 (×5): 1 via ORAL
  Filled 2019-12-31 (×5): qty 1

## 2019-12-31 MED ORDER — METRONIDAZOLE 500 MG PO TABS
500.0000 mg | ORAL_TABLET | Freq: Two times a day (BID) | ORAL | Status: DC
  Administered 2019-12-31 – 2020-01-02 (×4): 500 mg via ORAL
  Filled 2019-12-31 (×4): qty 1

## 2019-12-31 MED ORDER — ENOXAPARIN SODIUM 40 MG/0.4ML ~~LOC~~ SOLN
40.0000 mg | SUBCUTANEOUS | Status: DC
  Administered 2019-12-31 – 2020-01-01 (×2): 40 mg via SUBCUTANEOUS
  Filled 2019-12-31 (×2): qty 0.4

## 2019-12-31 MED ORDER — OXYCODONE HCL 5 MG PO TABS
5.0000 mg | ORAL_TABLET | Freq: Four times a day (QID) | ORAL | Status: DC | PRN
  Administered 2020-01-01 – 2020-01-02 (×4): 5 mg via ORAL
  Filled 2019-12-31 (×4): qty 1

## 2019-12-31 MED ORDER — PANTOPRAZOLE SODIUM 40 MG PO TBEC
40.0000 mg | DELAYED_RELEASE_TABLET | Freq: Every day | ORAL | Status: DC
  Administered 2020-01-01 – 2020-01-02 (×2): 40 mg via ORAL
  Filled 2019-12-31 (×2): qty 1

## 2019-12-31 MED ORDER — METHYLPREDNISOLONE SODIUM SUCC 125 MG IJ SOLR
60.0000 mg | Freq: Two times a day (BID) | INTRAMUSCULAR | Status: DC
  Administered 2019-12-31 – 2020-01-02 (×4): 60 mg via INTRAVENOUS
  Filled 2019-12-31 (×4): qty 2

## 2019-12-31 MED ORDER — AMOXICILLIN-POT CLAVULANATE 875-125 MG PO TABS: 1.0000 | ORAL_TABLET | Freq: Two times a day (BID) | ORAL | Status: DC

## 2019-12-31 MED ORDER — DICYCLOMINE HCL 20 MG PO TABS
20.0000 mg | ORAL_TABLET | Freq: Three times a day (TID) | ORAL | Status: DC | PRN
  Filled 2019-12-31: qty 1

## 2019-12-31 MED ORDER — IPRATROPIUM BROMIDE HFA 17 MCG/ACT IN AERS
2.0000 | INHALATION_SPRAY | Freq: Once | RESPIRATORY_TRACT | Status: AC
  Administered 2019-12-31: 2 via RESPIRATORY_TRACT
  Filled 2019-12-31: qty 12.9

## 2019-12-31 MED ORDER — AZITHROMYCIN 250 MG PO TABS
250.0000 mg | ORAL_TABLET | Freq: Every day | ORAL | Status: DC
  Administered 2020-01-01 – 2020-01-02 (×2): 250 mg via ORAL
  Filled 2019-12-31 (×2): qty 1

## 2019-12-31 MED ORDER — SODIUM CHLORIDE 0.9 % IV SOLN
1.0000 g | INTRAVENOUS | Status: DC
  Administered 2019-12-31 – 2020-01-01 (×2): 1 g via INTRAVENOUS
  Filled 2019-12-31: qty 1
  Filled 2019-12-31: qty 10
  Filled 2019-12-31: qty 1

## 2019-12-31 MED ORDER — IBUPROFEN 400 MG PO TABS
800.0000 mg | ORAL_TABLET | Freq: Three times a day (TID) | ORAL | Status: DC | PRN
  Administered 2020-01-02: 800 mg via ORAL
  Filled 2019-12-31 (×2): qty 2

## 2019-12-31 MED ORDER — ONDANSETRON HCL 4 MG PO TABS: 4.0000 mg | ORAL_TABLET | Freq: Four times a day (QID) | ORAL | Status: DC | PRN

## 2019-12-31 MED ORDER — ACETAMINOPHEN 325 MG PO TABS: 650.0000 mg | ORAL_TABLET | Freq: Four times a day (QID) | ORAL | Status: DC | PRN

## 2019-12-31 MED ORDER — ALBUTEROL SULFATE HFA 108 (90 BASE) MCG/ACT IN AERS
4.0000 | INHALATION_SPRAY | Freq: Once | RESPIRATORY_TRACT | Status: AC
  Administered 2019-12-31: 4 via RESPIRATORY_TRACT

## 2019-12-31 NOTE — ED Provider Notes (Signed)
[facility]-EMERGENCY DEPT Provider Note   CSN: [id number] Arrival date & time: 12/31/19  1733     History No chief complaint on file.   Dana Kennedy is a 54 y.o. female with PMH significant for anemia, asthma, SLE, RA, and tobacco use disorder presents to the ED with complaints of shortness of breath.  Reviewed patient's medical record and she was evaluated in the ER yesterday and chest x-ray obtained demonstrated patchy opacities in bilateral lung bases concerning for infection.  Patient was treated empirically for gonorrhea and chlamydia, diagnosed with UTI, and then also placed on azithromycin and amoxicillin clavulanic acid for her pneumonia while PCR COVID-19 test was pending.  Additionally, she was prescribed steroid burst and albuterol given her wheezing concerning for asthma exacerbation.  Patient has a 45-pack-year smoking history.  She denies any history of clots, clotting disorder, hormone replacement therapy, recent unilateral extremity swelling, recent surgery or immobilization, or other history aside from her tobacco use that is otherwise concerning for pulmonary embolism.  She states that she has been taking her medications, as prescribed, however her symptoms have only gotten worse.  Patient informed me that she was concerned about her ability to manage her symptoms at home given worsening respiratory status despite onset of medications.  HPI     Past Medical History:  Diagnosis Date  . Anemia   . Asthma   . Chest pain    atypcial chest pain evaluation 07/10/18 (Alvarez, Manrique, MD), echo order (NL LVEF, wall motion 09/2018)  . Chronic lower back pain   . Diverticulitis   . GERD (gastroesophageal reflux disease)   . History of blood transfusion    "HgB low w/lupus flareup" (08/19/2018)  . History of ITP 2014   NOT PROBLEM NOW  . Rheumatoid arthritis (HCC)    "legs; all my bones" (08/19/2018)  . SLE (systemic lupus erythematosus) (HCC)     . Umbilical hernia     Patient Active Problem List   Diagnosis Date Noted  . Diverticulitis large intestine 10/24/2018  . Acute diverticulitis 08/18/2018  . Vaginal spotting 08/18/2018  . GERD (gastroesophageal reflux disease)   . Asthma   . 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 Continuous tobacco abuse 05/02/2016  . 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 Status post tonsillectomy 05/02/2016  . Dehydration 10/12/2013  . Idiopathic thrombocytopenic purpura (HCC) 09/05/2012  . Lupus (systemic lupus erythematosus) (HCC) 09/05/2012  . Avascular necrosis of hip (HCC) 06/14/2012    Past Surgical History:  Procedure Laterality Date  . HERNIA REPAIR    . PARTIAL COLECTOMY N/A 10/24/2018   Procedure: PARTIAL COLON RESECTION WITH ANASTOMOSIS; APPENDECTOMY  ERAS PATHWAY SPLENIC FLEXURE MOBILIZATION; UMBILICAL HERNIA REPAIR;  Surgeon: Kinsinger, Luke Aaron, MD;  Location: [facility];  Service: General;  Laterality: N/A;  . stomach ulcer surgery  2008  . TONSILLECTOMY    . TOTAL HIP ARTHROPLASTY  06/14/2012   Procedure: TOTAL HIP ARTHROPLASTY ANTERIOR APPROACH;  Surgeon: Christopher Y Blackman, MD;  Location: [facility];  Service: Orthopedics;  Laterality: Left;  Left Total Hip Arthroplasty, Anterior Approach C-Arm  . TUBAL LIGATION    . UMBILICAL HERNIA REPAIR       OB History   No obstetric history on file.     Family History  Problem Relation Age of Onset  . Cancer Mother   . Hypertension Mother   . Heart attack Mother   . Liver disease Father   . Hypertension Brother       Social History   Tobacco Use  . Smoking status: Current Every Day Smoker    Packs/day: 0.50    Types: Cigarettes    Last attempt to quit: 09/01/2018    Years since quitting: 1.3  . Smokeless tobacco: Never Used  Substance Use Topics  . Alcohol use: Yes    Comment: 08/19/2018 "glass of wine a couple times/year"  . Drug use: No    Home Medications Prior to Admission medications    Medication Sig Start Date End Date Taking? Authorizing Provider  acetaminophen (TYLENOL) 325 MG tablet Take 2 tablets (650 mg total) by mouth every 6 (six) hours as needed for mild pain (or Fever >/= 101). 08/26/18  Yes Emokpae, Courage, MD  albuterol (VENTOLIN HFA) 108 (90 Base) MCG/ACT inhaler Inhale 2 puffs into the lungs every 4 (four) hours as needed for wheezing or shortness of breath. 12/30/19  Yes Albrizze, Kaitlyn E, PA-C  amoxicillin-clavulanate (AUGMENTIN) 875-125 MG tablet Take 1 tablet by mouth every 12 (twelve) hours. 12/30/19  Yes Albrizze, Kaitlyn E, PA-C  azithromycin (ZITHROMAX Z-PAK) 250 MG tablet Take 2 tablets (500 mg total) by mouth daily for 1 day, THEN 1 tablet (250 mg total) daily for 4 days. 12/31/19 01/05/20 Yes Albrizze, Kaitlyn E, PA-C  baclofen (LIORESAL) 10 MG tablet Take 10 mg by mouth 3 (three) times daily as needed for muscle pain. 11/26/19  Yes [provider]  budesonide-formoterol (SYMBICORT) 160-4.5 MCG/ACT inhaler Inhale 2 puffs into the lungs 2 (two) times daily. 11/12/17  Yes Mannam, Praveen, MD  dicyclomine (BENTYL) 20 MG tablet Take 1 tablet (20 mg total) by mouth every 8 (eight) hours as needed for spasms. 02/18/19  Yes Pickering, Nathan, MD  fluticasone (FLONASE) 50 MCG/ACT nasal spray Place 2 sprays into both nostrils daily. 11/12/17  Yes Mannam, Praveen, MD  fluticasone furoate-vilanterol (BREO ELLIPTA) 200-25 MCG/INH AEPB Inhale 1 puff into the lungs daily. 06/10/18  Yes Mannam, Praveen, MD  ibuprofen (ADVIL,MOTRIN) 800 MG tablet Take 1 tablet (800 mg total) by mouth every 8 (eight) hours as needed. 10/29/18  Yes Kinsinger, Luke Aaron, MD  metroNIDAZOLE (FLAGYL) 500 MG tablet Take 1 tablet (500 mg total) by mouth 2 (two) times daily. 12/30/19  Yes Albrizze, Kaitlyn E, PA-C  oxyCODONE-acetaminophen (PERCOCET) 10-325 MG tablet Take 1 tablet by mouth every 6 (six) hours as needed for pain. 07/20/19  Yes Lockwood, Robert, MD  pantoprazole (PROTONIX) 40 MG tablet  Take 1 tablet (40 mg total) by mouth daily. 08/26/18  Yes Emokpae, Courage, MD  predniSONE (DELTASONE) 20 MG tablet Take 1 tablet (20 mg total) by mouth daily for 4 days. 12/31/19 01/04/20 Yes Albrizze, Kaitlyn E, PA-C  chlorpheniramine (CHLOR-TRIMETON) 4 MG tablet Take 2 tablets (8 mg total) by mouth 3 (three) times daily. Patient not taking: Reported on 12/31/2019 12/25/17   Mannam, Praveen, MD  Nicotine 21-14-7 MG/24HR KIT Take as directed Patient not taking: Reported on 12/31/2019 03/05/18   Mannam, Praveen, MD    Allergies    Aspirin, Eszopiclone, Gabapentin, Allegra [fexofenadine], and Restoril [temazepam]  Review of Systems   Review of Systems  All other systems reviewed and are negative.   Physical Exam Updated Vital Signs BP (!) 137/91   Pulse 96   Temp 98.8 F (37.1 C) (Oral)   Resp 19   Ht 5' 5" (1.651 m)   Wt 97.5 kg   LMP 02/03/2018   SpO2 (!) 88%   BMI 35.78 kg/m   Physical Exam Vitals and nursing note reviewed.   Exam conducted with a chaperone present.  Constitutional:      Appearance: Normal appearance.  HENT:     Head: Normocephalic and atraumatic.  Eyes:     General: No scleral icterus.    Conjunctiva/sclera: Conjunctivae normal.  Cardiovascular:     Rate and Rhythm: Normal rate and regular rhythm.     Pulses: Normal pulses.     Heart sounds: Normal heart sounds.  Pulmonary:     Effort: Pulmonary effort is normal. No respiratory distress.     Breath sounds: Normal breath sounds.  Skin:    General: Skin is dry.  Neurological:     Mental Status: She is alert.     GCS: GCS eye subscore is 4. GCS verbal subscore is 5. GCS motor subscore is 6.  Psychiatric:        Mood and Affect: Mood normal.        Behavior: Behavior normal.        Thought Content: Thought content normal.     ED Results / Procedures / Treatments   Labs (all labs ordered are listed, but only abnormal results are displayed) Labs Reviewed  BASIC METABOLIC PANEL - Abnormal; Notable for  the following components:      Result Value   Glucose, Bld 116 (*)    All other components within normal limits  CBC - Abnormal; Notable for the following components:   WBC 11.9 (*)    All other components within normal limits  D-DIMER, QUANTITATIVE (NOT AT [facility])  I-STAT BETA HCG BLOOD, ED ([facility], [facility], [facility] ONLY)  TROPONIN I (HIGH SENSITIVITY)  TROPONIN I (HIGH SENSITIVITY)    EKG None  Radiology DG Chest 2 View  Result Date: 12/31/2019 CLINICAL DATA:  Chest pain and short of breath EXAM: CHEST - 2 VIEW COMPARISON:  12/29/2019 FINDINGS: No focal opacity or pleural effusion. Stable cardiomediastinal silhouette. No pneumothorax. IMPRESSION: No active cardiopulmonary disease. Electronically Signed   By: Donavan Foil M.D.   On: 12/31/2019 19:21   DG Chest 2 View  Result Date: 12/29/2019 CLINICAL DATA:  Shortness of breath for 1 day, history of asthma and GERD, current smoker EXAM: CHEST - 2 VIEW COMPARISON:  Radiograph 12/25/2017 FINDINGS: Some increasing patchy and interstitial opacities noted towards the lung bases and periphery. No pneumothorax or effusion. Cardiomediastinal contours are similar to prior exams accounting for differences in technique. No acute osseous or soft tissue abnormality. IMPRESSION: Increasing patchy and interstitial opacities towards the lung bases and periphery. Findings could represent developing infection or edema. Electronically Signed   By: Lovena Le M.D.   On: 12/29/2019 22:15    Procedures Procedures (including critical care time)  Medications Ordered in ED Medications  sodium chloride flush (NS) 0.9 % injection 3 mL (3 mLs Intravenous Given 12/31/19 1808)  albuterol (VENTOLIN HFA) 108 (90 Base) MCG/ACT inhaler 6 puff (6 puffs Inhalation Given 12/31/19 1921)  ipratropium (ATROVENT HFA) inhaler 2 puff (2 puffs Inhalation Given 12/31/19 1924)  magnesium sulfate IVPB 1 g 100 mL (0 g Intravenous Stopped 12/31/19 2121)  albuterol (VENTOLIN HFA) 108 (90 Base)  MCG/ACT inhaler 4 puff (4 puffs Inhalation Given 12/31/19 2009)    ED Course  I have reviewed the triage vital signs and the nursing notes.  Pertinent labs & imaging results that were available during my care of the patient were reviewed by me and considered in my medical decision making (see chart for details).    MDM Rules/Calculators/A&P  Patient's laboratory work-up is reassuring and plain films obtained today demonstrate no active cardiopulmonary disease.  However, patient continues to be tachycardic and tachypneic with increased work of breathing throughout my examination.  She is not hypoxic, but her oxygen has been borderline at 91-92% during my exam.  Based on her significant wheezing, suspect asthma exacerbation.  She had taken prednisone this morning and had been treated for her suspected exacerbation yesterday, but she received magnesium.  Treating with 1 g magnesium, albuterol x6, troponin x2, and will reevaluate.  On reevaluation, patient continues to demonstrate increased work of breathing.  Will give another 4 puffs albuterol.  Given that patient has failed conservative outpatient management for her asthma exacerbation, would preferably admit patient for her significantly increased work.  Dr. Dykstra personally evaluated patient and agrees with assessment and plan.  On subsequent evaluation, patient still continues to demonstrate increased work of breathing and significant wheezing on auscultation.  She has now received 10 puffs albuterol, 2 puffs ipratropium, 1 g magnesium, and yet she continues to struggle with her exacerbation, likely precipitated by seasonal allergies.  Will consult hospitalist for admission for observation.     Final Clinical Impression(s) / ED Diagnoses Final diagnoses:  Moderate persistent asthma with exacerbation    Rx / DC Orders ED Discharge Orders    None       Green, Garrett L, PA-C 01/16/20 0957    Dykstra, Richard S,  MD 01/16/20 1947  

## 2019-12-31 NOTE — H&P (Signed)
History and Physical    Dana Kennedy [id number] DOB: September 21, 1965 DOA: 12/31/2019  [facility]: [facility]  Patient coming from: Home  I have personally briefly reviewed patient's old medical records in [place]  Chief Complaint: Worsening SOB  HPI: Dana Kennedy is a 54 y.o. female with medical history significant of Asthma.  Pt seen in ED on 4/12 for SOB and wheezing.  Also vaginal discharge.  Diagnosed with: 1) multifocal PNA, COVID rapid and PCR were both negative 2) Asthma exacerbation 3) BV 4) UTI - though culture grew out multiple organisms  Pt sent home on Amoxicillin, azithromycin, flagyl, and prednisone, as well as inhalers.  Pts symptoms have continued to worsen with worsening wheezing and SOB despite treatment.  Returns to ED.   ED Course: CXR today actually being called "No active disease".  Wheezing and SOB persists despite albuterol, magnesium treatments.  WBC 11k up from 7k x2 days ago.  Trop and D.Dimer neg.   Review of Systems: As per HPI, otherwise all review of systems negative.  Past Medical History:  Diagnosis Date  . Anemia   . Asthma   . Chest pain    atypcial chest pain evaluation 07/10/18 Philbert Riser, Coletta Memos, MD), echo order (NL LVEF, wall motion 09/2018)  . Chronic lower back pain   . Diverticulitis   . GERD (gastroesophageal reflux disease)   . History of blood transfusion    "HgB low w/lupus flareup" (08/19/2018)  . History of ITP 2014   NOT PROBLEM NOW  . Rheumatoid arthritis ([place])    "legs; all my bones" (08/19/2018)  . SLE (systemic lupus erythematosus) ([place])   . Umbilical hernia     Past Surgical History:  Procedure Laterality Date  . HERNIA REPAIR    . PARTIAL COLECTOMY N/A 10/24/2018   Procedure: PARTIAL COLON RESECTION WITH ANASTOMOSIS; APPENDECTOMY  ERAS PATHWAY SPLENIC FLEXURE MOBILIZATION; UMBILICAL HERNIA REPAIR;  Surgeon: Kinsinger, Arta Bruce, MD;  Location: [facility];   Service: General;  Laterality: N/A;  . stomach ulcer surgery  2008  . TONSILLECTOMY    . TOTAL HIP ARTHROPLASTY  06/14/2012   Procedure: TOTAL HIP ARTHROPLASTY ANTERIOR APPROACH;  Surgeon: Mcarthur Rossetti, MD;  Location: [facility];  Service: Orthopedics;  Laterality: Left;  Left Total Hip Arthroplasty, Anterior Approach C-Arm  . TUBAL LIGATION    . UMBILICAL HERNIA REPAIR       reports that she has been smoking cigarettes. She has been smoking about 0.50 packs per day. She has never used smokeless tobacco. She reports current alcohol use. She reports that she does not use drugs.  Allergies  Allergen Reactions  . Aspirin     Causes bleeding  . Eszopiclone Swelling    Felt like throat was closing. Not sure if it was related to this medication or not. lunesta  . Gabapentin Swelling    Ankles swelling  . Allegra [Fexofenadine] Cough    Pt states she began having spells of dry cough after taking this medication  . Restoril [Temazepam] Other (See Comments)    Headaches     Family History  Problem Relation Age of Onset  . Cancer Mother   . Hypertension Mother   . Heart attack Mother   . Liver disease Father   . Hypertension Brother      Prior to Admission medications   Medication Sig Start Date End Date Taking? Authorizing Provider  acetaminophen (TYLENOL) 325 MG tablet Take 2 tablets (650 mg total) by  mouth every 6 (six) hours as needed for mild pain (or Fever >/= 101). 08/26/18  Yes Emokpae, Courage, MD  albuterol (VENTOLIN HFA) 108 (90 Base) MCG/ACT inhaler Inhale 2 puffs into the lungs every 4 (four) hours as needed for wheezing or shortness of breath. 12/30/19  Yes Albrizze, Kaitlyn E, PA-C  amoxicillin-clavulanate (AUGMENTIN) 875-125 MG tablet Take 1 tablet by mouth every 12 (twelve) hours. 12/30/19  Yes Albrizze, Kaitlyn E, PA-C  azithromycin (ZITHROMAX Z-PAK) 250 MG tablet Take 2 tablets (500 mg total) by mouth daily for 1 day, THEN 1 tablet (250 mg total) daily for 4 days.  12/31/19 01/05/20 Yes Albrizze, Kaitlyn E, PA-C  baclofen (LIORESAL) 10 MG tablet Take 10 mg by mouth 3 (three) times daily as needed for muscle pain. 11/26/19  Yes [provider]  budesonide-formoterol (SYMBICORT) 160-4.5 MCG/ACT inhaler Inhale 2 puffs into the lungs 2 (two) times daily. 11/12/17  Yes Mannam, Praveen, MD  dicyclomine (BENTYL) 20 MG tablet Take 1 tablet (20 mg total) by mouth every 8 (eight) hours as needed for spasms. 02/18/19  Yes Davonna Belling, MD  fluticasone (FLONASE) 50 MCG/ACT nasal spray Place 2 sprays into both nostrils daily. 11/12/17  Yes Mannam, Praveen, MD  fluticasone furoate-vilanterol (BREO ELLIPTA) 200-25 MCG/INH AEPB Inhale 1 puff into the lungs daily. 06/10/18  Yes Mannam, Praveen, MD  ibuprofen (ADVIL,MOTRIN) 800 MG tablet Take 1 tablet (800 mg total) by mouth every 8 (eight) hours as needed. 10/29/18  Yes Kinsinger, Arta Bruce, MD  metroNIDAZOLE (FLAGYL) 500 MG tablet Take 1 tablet (500 mg total) by mouth 2 (two) times daily. 12/30/19  Yes Albrizze, Harley Hallmark, PA-C  oxyCODONE-acetaminophen (PERCOCET) 10-325 MG tablet Take 1 tablet by mouth every 6 (six) hours as needed for pain. 07/20/19  Yes Carmin Muskrat, MD  pantoprazole (PROTONIX) 40 MG tablet Take 1 tablet (40 mg total) by mouth daily. 08/26/18  Yes Emokpae, Courage, MD  predniSONE (DELTASONE) 20 MG tablet Take 1 tablet (20 mg total) by mouth daily for 4 days. 12/31/19 01/04/20 Yes Albrizze, Harley Hallmark, PA-C    Physical Exam: Vitals:   12/31/19 1930 12/31/19 2030 12/31/19 2045 12/31/19 2100  BP: (!) 145/87 (!) 116/91  (!) 137/91  Pulse: (!) 104  97 96  Resp: (!) 21 (!) 28 18 19   Temp:      TempSrc:      SpO2: 98%  94% (!) 88%  Weight:      Height:        Constitutional: NAD, calm, comfortable Eyes: PERRL, lids and conjunctivae normal ENMT: Mucous membranes are moist. Posterior pharynx clear of any exudate or lesions.Normal dentition.  Neck: normal, supple, no masses, no  thyromegaly Respiratory: Wheezing and tachypnea present, no respiratory distress. Cardiovascular: Regular rate and rhythm, no murmurs / rubs / gallops. No extremity edema. 2+ pedal pulses. No carotid bruits.  Abdomen: no tenderness, no masses palpated. No hepatosplenomegaly. Bowel sounds positive.  Musculoskeletal: no clubbing / cyanosis. No joint deformity upper and lower extremities. Good ROM, no contractures. Normal muscle tone.  Skin: no rashes, lesions, ulcers. No induration Neurologic: CN 2-12 grossly intact. Sensation intact, DTR normal. Strength 5/5 in all 4.  Psychiatric: Normal judgment and insight. Alert and oriented x 3. Normal mood.    Labs on Admission: I have personally reviewed following labs and imaging studies  CBC: Recent Labs  Lab 12/29/19 1535 12/31/19 1810  WBC 7.5 11.9*  HGB 14.4 14.1  HCT 44.9 43.5  MCV 85.4 85.1  PLT 404* 380  Basic Metabolic Panel: Recent Labs  Lab 12/29/19 1535 12/31/19 1810  NA 141 141  K 3.7 4.0  CL 103 105  CO2 26 24  GLUCOSE 103* 116*  BUN 5* 15  CREATININE 0.76 0.79  CALCIUM 9.7 9.6   GFR: Estimated Creatinine Clearance: 95.1 mL/min (by C-G formula based on SCr of 0.79 mg/dL). Liver Function Tests: Recent Labs  Lab 12/29/19 1535  AST 15  ALT 15  ALKPHOS 92  BILITOT 0.6  PROT 6.7  ALBUMIN 3.7   No results for input(s): LIPASE, AMYLASE in the last 168 hours. No results for input(s): AMMONIA in the last 168 hours. Coagulation Profile: No results for input(s): INR, PROTIME in the last 168 hours. Cardiac Enzymes: No results for input(s): CKTOTAL, CKMB, CKMBINDEX, TROPONINI in the last 168 hours. BNP (last 3 results) No results for input(s): PROBNP in the last 8760 hours. HbA1C: No results for input(s): HGBA1C in the last 72 hours. CBG: No results for input(s): GLUCAP in the last 168 hours. Lipid Profile: No results for input(s): CHOL, HDL, LDLCALC, TRIG, CHOLHDL, LDLDIRECT in the last 72 hours. Thyroid  Function Tests: No results for input(s): TSH, T4TOTAL, FREET4, T3FREE, THYROIDAB in the last 72 hours. Anemia Panel: No results for input(s): VITAMINB12, FOLATE, FERRITIN, TIBC, IRON, RETICCTPCT in the last 72 hours. Urine analysis:    Component Value Date/Time   COLORURINE YELLOW 12/30/2019 0839   APPEARANCEUR HAZY (A) 12/30/2019 0839   LABSPEC 1.012 12/30/2019 0839   PHURINE 6.0 12/30/2019 0839   GLUCOSEU NEGATIVE 12/30/2019 0839   HGBUR MODERATE (A) 12/30/2019 0839   BILIRUBINUR NEGATIVE 12/30/2019 0839   KETONESUR NEGATIVE 12/30/2019 0839   PROTEINUR NEGATIVE 12/30/2019 0839   UROBILINOGEN 0.2 09/22/2014 2302   NITRITE NEGATIVE 12/30/2019 0839   LEUKOCYTESUR LARGE (A) 12/30/2019 0839    Radiological Exams on Admission: DG Chest 2 View  Result Date: 12/31/2019 CLINICAL DATA:  Chest pain and short of breath EXAM: CHEST - 2 VIEW COMPARISON:  12/29/2019 FINDINGS: No focal opacity or pleural effusion. Stable cardiomediastinal silhouette. No pneumothorax. IMPRESSION: No active cardiopulmonary disease. Electronically Signed   By: Donavan Foil M.D.   On: 12/31/2019 19:21   DG Chest 2 View  Result Date: 12/29/2019 CLINICAL DATA:  Shortness of breath for 1 day, history of asthma and GERD, current smoker EXAM: CHEST - 2 VIEW COMPARISON:  Radiograph 12/25/2017 FINDINGS: Some increasing patchy and interstitial opacities noted towards the lung bases and periphery. No pneumothorax or effusion. Cardiomediastinal contours are similar to prior exams accounting for differences in technique. No acute osseous or soft tissue abnormality. IMPRESSION: Increasing patchy and interstitial opacities towards the lung bases and periphery. Findings could represent developing infection or edema. Electronically Signed   By: Lovena Le M.D.   On: 12/29/2019 22:15    EKG: Independently reviewed.  Assessment/Plan Principal Problem:   Asthma exacerbation Active Problems:   Acute lower UTI   Bacterial  vaginosis   CAP (community acquired pneumonia)    1. Asthma exacerbation - 1. Adult wheeze protocol 2. Cont home inh steroids and nebs 3. PRN albuterol 4. Changing / increasing steroids to IV solumedrol 60mg  Q12H 5. Cont pulse ox 2. ? CAP - 1. COVID neg on 4/12 2. Will go ahead and repeat COVID just in case and call her a PUI until another neg COVID test 3. Though CXR actually not being called PNA today 4. Will switch augmentin to Rocephin 5. Continue azithromycin 3. UTI - 1. Switching augmentin to rocephin for ?  CAP above 2. Culture with multiple organisms 4. BV - cont flagyl  DVT prophylaxis: Lovenox Code Status: Full Family Communication: Husband at bedside Disposition Plan: Home after breathing improved Consults called: None Admission status: Place in obs, though should qualify for IP status as she failed outpt treatment.    Flay Ghosh Jerilynn Mages DO [facility]  How to contact the [facility] Attending or Consulting provider [place] or covering provider during after hours [place], for this patient?  1. Check the care team in [facility] and look for a) attending/consulting [facility] provider listed and b) the [facility] team listed 2. Log into [email or web address]  Amion Physician Scheduling and messaging for groups and whole hospitals  On call and physician scheduling software for group practices, residents, hospitalists and other medical providers for call, clinic, rotation and shift schedules. On[employer] is a hospital-wide system for scheduling doctors and paging doctors on call. EasyPlot is for scientific plotting and data analysis.  [email or web address]  and use [facility] universal password to access. If you do not have the password, please contact the hospital operator.  3. Locate the [facility] provider you are looking for under [facility] and page to a number that you can be directly reached. 4. If you still have difficulty reaching the provider, please page the [facility] (Director on Call) for the  Hospitalists listed on amion for assistance.  12/31/2019, 9:37 PM

## 2019-12-31 NOTE — ED Notes (Signed)
ED TO INPATIENT HANDOFF REPORT  Name/Age/Gender Dana Kennedy A [place]-Baugham 54 y.o. female  Code Status    Code Status Orders  (From admission, onward)         Start     Ordered   12/31/19 2129  Full code  Continuous     12/31/19 2129        Code Status History    Date Active Date Inactive Code Status Order ID Comments User Context   10/24/2018 1509 10/29/2018 1653 Full Code [id number]  Kinsinger, Arta Bruce, MD Inpatient   08/18/2018 1602 08/26/2018 2226 Full Code [id number]  Truett Mainland, DO Inpatient   10/12/2013 1939 10/13/2013 1408 Full Code [id number]  Ruby Cola, MD ED   12/16/2012 0139 12/18/2012 1724 Full Code [id number]  Reyne Dumas, MD Inpatient   06/14/2012 1330 06/17/2012 1828 Full Code [id number]  Yvonna Alanis, RN Inpatient   Advance Care Planning Activity      Home/SNF/Other Home  Chief Complaint Asthma exacerbation [J45.901]  Level of Care/Admitting Diagnosis ED Disposition    ED Disposition Condition Comment   [facility]: [facility] [id number]  Level of Care: Med-Surg [16]  Covid Evaluation: Symptomatic Person Under Investigation (PUI)  Diagnosis: Asthma exacerbation [id number]  Admitting Physician: Etta Quill [id number]  Attending Physician: Etta Quill [phone]       Medical History Past Medical History:  Diagnosis Date  . Anemia   . Asthma   . Chest pain    atypcial chest pain evaluation 07/10/18 Philbert Riser, Coletta Memos, MD), echo order (NL LVEF, wall motion 09/2018)  . Chronic lower back pain   . Diverticulitis   . GERD (gastroesophageal reflux disease)   . History of blood transfusion    "HgB low w/lupus flareup" (08/19/2018)  . History of ITP 2014   NOT PROBLEM NOW  . Rheumatoid arthritis ([place])    "legs; all my bones" (08/19/2018)  . SLE (systemic lupus erythematosus) ([place])   . Umbilical hernia     Allergies Allergies  Allergen Reactions  . Aspirin     Causes bleeding  . Eszopiclone Swelling    Felt like  throat was closing. Not sure if it was related to this medication or not. lunesta  . Gabapentin Swelling    Ankles swelling  . Allegra [Fexofenadine] Cough    Pt states she began having spells of dry cough after taking this medication  . Restoril [Temazepam] Other (See Comments)    Headaches     IV Location/Drains/Wounds Patient Lines/Drains/Airways Status   Active Line/Drains/Airways    Name:   Placement date:   Placement time:   Site:   Days:   Peripheral IV 12/31/19 Right Forearm   12/31/19    1807    Forearm   less than 1   Incision 06/14/12 Hip Left   06/14/12    1024     2756   Incision 10/13/13 Throat   10/13/13    0805     2270          Labs/Imaging Results for orders placed or performed during the hospital encounter of 12/31/19 (from the past 48 hour(s))  Basic metabolic panel     Status: Abnormal   Collection Time: 12/31/19  6:10 PM  Result Value Ref Range   Sodium 141 135 - 145 mmol/L   Potassium 4.0 3.5 - 5.1 mmol/L   Chloride 105 98 - 111 mmol/L   CO2 24 22 - 32 mmol/L   Glucose, Bld  116 (H) 70 - 99 mg/dL    Comment: Glucose reference range applies only to samples taken after fasting for at least 8 hours.   BUN 15 6 - 20 mg/dL   Creatinine, Ser 0.79 0.44 - 1.00 mg/dL   Calcium 9.6 8.9 - 10.3 mg/dL   GFR calc non Af Amer >60 >60 mL/min   GFR calc Af Amer >60 >60 mL/min   Anion gap 12 5 - 15    Comment: Performed at [facility], [place] [address], [place], [place] [address]  CBC     Status: Abnormal   Collection Time: 12/31/19  6:10 PM  Result Value Ref Range   WBC 11.9 (H) 4.0 - 10.5 K/uL   RBC 5.11 3.87 - 5.11 MIL/uL   Hemoglobin 14.1 12.0 - 15.0 g/dL   HCT 43.5 36.0 - 46.0 %   MCV 85.1 80.0 - 100.0 fL   MCH 27.6 26.0 - 34.0 pg   MCHC 32.4 30.0 - 36.0 g/dL   RDW 14.6 11.5 - 15.5 %   Platelets 380 150 - 400 K/uL   nRBC 0.0 0.0 - 0.2 %    Comment: Performed at [facility], [place] [address], [place], [place] [address]   Troponin I (High Sensitivity)     Status: None   Collection Time: 12/31/19  6:10 PM  Result Value Ref Range   Troponin I (High Sensitivity) <2 <18 ng/L    Comment: (NOTE) Elevated high sensitivity troponin I (hsTnI) values and significant  changes across serial measurements may suggest ACS but many other  chronic and acute conditions are known to elevate hsTnI results.  Refer to the "Links" section for chest pain algorithms and additional  guidance. Performed at [facility], [place] [address], [place], [place] [address]   I-Stat beta hCG blood, ED     Status: None   Collection Time: 12/31/19  6:15 PM  Result Value Ref Range   I-stat hCG, quantitative <5.0 <5 mIU/mL   Comment 3            Comment:   GEST. AGE      CONC.  (mIU/mL)   <=1 WEEK        5 - 50     2 WEEKS       50 - 500     3 WEEKS       100 - 10,000     4 WEEKS     1,000 - 30,000        FEMALE AND NON-PREGNANT FEMALE:     LESS THAN 5 mIU/mL   D-dimer, quantitative (not at [facility])     Status: None   Collection Time: 12/31/19  6:15 PM  Result Value Ref Range   D-Dimer, Quant 0.36 0.00 - 0.50 ug/mL-FEU    Comment: (NOTE) At the manufacturer cut-off of 0.50 ug/mL FEU, this assay has been documented to exclude PE with a sensitivity and negative predictive value of 97 to 99%.  At this time, this assay has not been approved by the FDA to exclude DVT/VTE. Results should be correlated with clinical presentation. Performed at [facility], [place] [address], [place], [place] [address]   Troponin I (High Sensitivity)     Status: None   Collection Time: 12/31/19  7:52 PM  Result Value Ref Range   Troponin I (High Sensitivity) <2 <18 ng/L    Comment: (NOTE) Elevated high sensitivity troponin I (hsTnI) values and significant  changes across serial measurements may suggest ACS but many other  chronic and acute conditions are known to elevate hsTnI results.  Refer to the "Links" section for  chest pain algorithms and additional  guidance. Performed at [facility], [place] [address], [place], [place] [address]    DG Chest 2 View  Result Date: 12/31/2019 CLINICAL DATA:  Chest pain and short of breath EXAM: CHEST - 2 VIEW COMPARISON:  12/29/2019 FINDINGS: No focal opacity or pleural effusion. Stable cardiomediastinal silhouette. No pneumothorax. IMPRESSION: No active cardiopulmonary disease. Electronically Signed   By: Donavan Foil M.D.   On: 12/31/2019 19:21    Pending Labs Unresulted Labs (From admission, onward)    Start     Ordered   01/01/20 0500  CBC  Tomorrow morning,   R     12/31/19 2129   01/01/20 [id number]  Basic metabolic panel  Tomorrow morning,   R     12/31/19 2129   12/31/19 2137  SARS CORONAVIRUS 2 (TAT 6-24 HRS) Nasopharyngeal Nasopharyngeal Swab  (Tier 3 (TAT 6-24 hrs))  Once,   STAT    Question Answer Comment  Is this test for diagnosis or screening Screening   Symptomatic for COVID-19 as defined by CDC No   Hospitalized for COVID-19 No   Admitted to ICU for COVID-19 No   Previously tested for COVID-19 Yes   Resident in a congregate (group) care setting No   Employed in healthcare setting No   Pregnant No      12/31/19 2137          Vitals/Pain Today's Vitals   12/31/19 2030 12/31/19 2045 12/31/19 2100 12/31/19 2208  BP: (!) 116/91  (!) 137/91 126/77  Pulse:  97 96 (!) 107  Resp: (!) 28 18 19  (!) 24  Temp:      TempSrc:      SpO2:  94% 96% 96%  Weight:      Height:      PainSc:        Isolation Precautions No active isolations  Medications Medications  azithromycin (ZITHROMAX) tablet 250 mg (has no administration in time range)  fluticasone furoate-vilanterol (BREO ELLIPTA) 200-25 MCG/INH 1 puff (has no administration in time range)  mometasone-formoterol (DULERA) 200-5 MCG/ACT inhaler 2 puff (has no administration in time range)  baclofen (LIORESAL) tablet 10 mg (has no administration in time range)   oxyCODONE-acetaminophen (PERCOCET) 10-325 MG per tablet 1 tablet (has no administration in time range)  pantoprazole (PROTONIX) EC tablet 40 mg (has no administration in time range)  metroNIDAZOLE (FLAGYL) tablet 500 mg (has no administration in time range)  ibuprofen (ADVIL) tablet 800 mg (has no administration in time range)  dicyclomine (BENTYL) tablet 20 mg (has no administration in time range)  fluticasone (FLONASE) 50 MCG/ACT nasal spray 2 spray (has no administration in time range)  acetaminophen (TYLENOL) tablet 650 mg (has no administration in time range)  albuterol (PROVENTIL) (2.5 MG/3ML) 0.083% nebulizer solution 2.5 mg (has no administration in time range)  methylPREDNISolone sodium succinate (SOLU-MEDROL) 125 mg/2 mL injection 60 mg (has no administration in time range)  ondansetron (ZOFRAN) tablet 4 mg (has no administration in time range)    Or  ondansetron (ZOFRAN) injection 4 mg (has no administration in time range)  enoxaparin (LOVENOX) injection 40 mg (has no administration in time range)  cefTRIAXone (ROCEPHIN) 1 g in sodium chloride 0.9 % 100 mL IVPB (has no administration in time range)  sodium chloride flush (NS) 0.9 %  injection 3 mL (3 mLs Intravenous Given 12/31/19 1808)  albuterol (VENTOLIN HFA) 108 (90 Base) MCG/ACT inhaler 6 puff (6 puffs Inhalation Given 12/31/19 1921)  ipratropium (ATROVENT HFA) inhaler 2 puff (2 puffs Inhalation Given 12/31/19 1924)  magnesium sulfate IVPB 1 g 100 mL (0 g Intravenous Stopped 12/31/19 2121)  albuterol (VENTOLIN HFA) 108 (90 Base) MCG/ACT inhaler 4 puff (4 puffs Inhalation Given 12/31/19 2009)    Mobility walks

## 2019-12-31 NOTE — ED Notes (Signed)
Transport called to take patient upstairs 

## 2019-12-31 NOTE — ED Triage Notes (Signed)
Patient states that she was at [facility] ED on 12/29/19. Patient states she was diagnosed with pneumonia. Patient c/o increased SOB and chest pain.

## 2020-01-01 DIAGNOSIS — Z888 Allergy status to other drugs, medicaments and biological substances status: Secondary | ICD-10-CM | POA: Diagnosis not present

## 2020-01-01 DIAGNOSIS — Z862 Personal history of diseases of the blood and blood-forming organs and certain disorders involving the immune mechanism: Secondary | ICD-10-CM | POA: Diagnosis not present

## 2020-01-01 DIAGNOSIS — M329 Systemic lupus erythematosus, unspecified: Secondary | ICD-10-CM | POA: Diagnosis present

## 2020-01-01 DIAGNOSIS — J45901 Unspecified asthma with (acute) exacerbation: Secondary | ICD-10-CM | POA: Diagnosis present

## 2020-01-01 DIAGNOSIS — N760 Acute vaginitis: Secondary | ICD-10-CM | POA: Diagnosis present

## 2020-01-01 DIAGNOSIS — B9689 Other specified bacterial agents as the cause of diseases classified elsewhere: Secondary | ICD-10-CM | POA: Diagnosis not present

## 2020-01-01 DIAGNOSIS — Z720 Tobacco use: Secondary | ICD-10-CM

## 2020-01-01 DIAGNOSIS — R42 Dizziness and giddiness: Secondary | ICD-10-CM | POA: Diagnosis present

## 2020-01-01 DIAGNOSIS — Z20822 Contact with and (suspected) exposure to covid-19: Secondary | ICD-10-CM | POA: Diagnosis present

## 2020-01-01 DIAGNOSIS — M069 Rheumatoid arthritis, unspecified: Secondary | ICD-10-CM | POA: Diagnosis present

## 2020-01-01 DIAGNOSIS — F17210 Nicotine dependence, cigarettes, uncomplicated: Secondary | ICD-10-CM | POA: Diagnosis present

## 2020-01-01 DIAGNOSIS — Z7951 Long term (current) use of inhaled steroids: Secondary | ICD-10-CM | POA: Diagnosis not present

## 2020-01-01 DIAGNOSIS — I160 Hypertensive urgency: Secondary | ICD-10-CM | POA: Diagnosis present

## 2020-01-01 DIAGNOSIS — J4541 Moderate persistent asthma with (acute) exacerbation: Secondary | ICD-10-CM | POA: Diagnosis present

## 2020-01-01 DIAGNOSIS — Z79899 Other long term (current) drug therapy: Secondary | ICD-10-CM | POA: Diagnosis not present

## 2020-01-01 DIAGNOSIS — Z9049 Acquired absence of other specified parts of digestive tract: Secondary | ICD-10-CM | POA: Diagnosis not present

## 2020-01-01 DIAGNOSIS — Z79891 Long term (current) use of opiate analgesic: Secondary | ICD-10-CM | POA: Diagnosis not present

## 2020-01-01 DIAGNOSIS — Z8249 Family history of ischemic heart disease and other diseases of the circulatory system: Secondary | ICD-10-CM | POA: Diagnosis not present

## 2020-01-01 DIAGNOSIS — Z791 Long term (current) use of non-steroidal anti-inflammatories (NSAID): Secondary | ICD-10-CM | POA: Diagnosis not present

## 2020-01-01 DIAGNOSIS — K219 Gastro-esophageal reflux disease without esophagitis: Secondary | ICD-10-CM | POA: Diagnosis present

## 2020-01-01 DIAGNOSIS — Z886 Allergy status to analgesic agent status: Secondary | ICD-10-CM | POA: Diagnosis not present

## 2020-01-01 DIAGNOSIS — Z96642 Presence of left artificial hip joint: Secondary | ICD-10-CM | POA: Diagnosis present

## 2020-01-01 DIAGNOSIS — N390 Urinary tract infection, site not specified: Secondary | ICD-10-CM | POA: Diagnosis present

## 2020-01-01 LAB — CBC
HCT: 42 % (ref 36.0–46.0)
Hemoglobin: 13.2 g/dL (ref 12.0–15.0)
MCH: 27.1 pg (ref 26.0–34.0)
MCHC: 31.4 g/dL (ref 30.0–36.0)
MCV: 86.2 fL (ref 80.0–100.0)
Platelets: 359 10*3/uL (ref 150–400)
RBC: 4.87 MIL/uL (ref 3.87–5.11)
RDW: 14.6 % (ref 11.5–15.5)
WBC: 9.8 10*3/uL (ref 4.0–10.5)
nRBC: 0 % (ref 0.0–0.2)

## 2020-01-01 LAB — BASIC METABOLIC PANEL
Anion gap: 13 (ref 5–15)
BUN: 15 mg/dL (ref 6–20)
CO2: 23 mmol/L (ref 22–32)
Calcium: 9.2 mg/dL (ref 8.9–10.3)
Chloride: 104 mmol/L (ref 98–111)
Creatinine, Ser: 0.77 mg/dL (ref 0.44–1.00)
GFR calc Af Amer: >60 mL/min (ref >60)
GFR calc non Af Amer: >60 mL/min (ref >60)
Glucose, Bld: 222 mg/dL — ABNORMAL HIGH (ref 70–99)
Potassium: 3.7 mmol/L (ref 3.5–5.1)
Sodium: 140 mmol/L (ref 135–145)

## 2020-01-01 LAB — SARS CORONAVIRUS 2 (TAT 6-24 HRS): SARS Coronavirus 2: NEGATIVE

## 2020-01-01 MED ORDER — MONTELUKAST SODIUM 10 MG PO TABS
10.0000 mg | ORAL_TABLET | Freq: Every day | ORAL | Status: DC
  Administered 2020-01-01: 10 mg via ORAL
  Filled 2020-01-01: qty 1

## 2020-01-01 MED ORDER — BENZONATATE 100 MG PO CAPS
200.0000 mg | ORAL_CAPSULE | Freq: Two times a day (BID) | ORAL | Status: DC | PRN
  Administered 2020-01-02: 200 mg via ORAL
  Filled 2020-01-01: qty 2

## 2020-01-01 MED ORDER — NICOTINE 21 MG/24HR TD PT24
21.0000 mg | MEDICATED_PATCH | Freq: Every day | TRANSDERMAL | Status: DC
  Administered 2020-01-01 – 2020-01-02 (×2): 21 mg via TRANSDERMAL
  Filled 2020-01-01 (×2): qty 1

## 2020-01-01 MED ORDER — ALBUTEROL SULFATE (2.5 MG/3ML) 0.083% IN NEBU: 2.5000 mg | INHALATION_SOLUTION | RESPIRATORY_TRACT | Status: DC

## 2020-01-01 MED ORDER — ALBUTEROL SULFATE (2.5 MG/3ML) 0.083% IN NEBU
2.5000 mg | INHALATION_SOLUTION | RESPIRATORY_TRACT | Status: DC
  Administered 2020-01-01 – 2020-01-02 (×8): 2.5 mg via RESPIRATORY_TRACT
  Filled 2020-01-01 (×8): qty 3

## 2020-01-01 MED ORDER — ALBUTEROL SULFATE (2.5 MG/3ML) 0.083% IN NEBU
2.5000 mg | INHALATION_SOLUTION | RESPIRATORY_TRACT | Status: DC | PRN
  Administered 2020-01-02: 2.5 mg via RESPIRATORY_TRACT
  Filled 2020-01-01: qty 3

## 2020-01-01 NOTE — Progress Notes (Signed)
PROGRESS NOTE    Dana Kennedy  [id number] DOB: July 18, 1966 DOA: 12/31/2019 PCP: [facility]   Brief Narrative: Dana Kennedy is a 54 y.o. female with a history of asthma. Patient presented secondary to worsening dyspnea concerning for an asthma exacerbation. COVID-19 testing was negative. Started on treatment for asthma exacerbation.   Assessment & Plan:   Principal Problem:   Asthma exacerbation Active Problems:   Acute lower UTI   Bacterial vaginosis   CAP (community acquired pneumonia)   Asthma exacerbation Patient initially treated with Solu-medrol, albuterol inhaler. Exacerbation likely contributed to by patient's tobacco use; possible she could have a component of COPD but no official diagnosis. Seasonal allergies also seem to contribute. -Start albuterol nebulizer treatments q4 hours scheduled -Continue Solu-medrol IV -Continue azithromycin  Bacterial vaginosis -Continue Flagyl 500 mg BID  Concern for UTI No symptoms. Urine culture with multiple organisms.   DVT prophylaxis: Lovenox Code Status:   Code Status: Full Code Family Communication: None at bedside Disposition Plan: Discharge in 24-48 hours if clinically improved and functionally closer to baseline   Consultants:   None  Procedures:   None  Antimicrobials:  Ceftriaxone  Azithromycin    Subjective: Decreased exercise tolerance from baseline. Breathing is better than yesterday.  Objective: Vitals:   01/01/20 0534 01/01/20 0808 01/01/20 1029 01/01/20 1248  BP: 136/83   (!) 157/92  Pulse: 79   96  Resp: 19   18  Temp: 97.8 F (36.6 C)   99.1 F (37.3 C)  TempSrc: Oral     SpO2: 96% 94% 96% 94%  Weight:      Height:        Intake/Output Summary (Last 24 hours) at 01/01/2020 1348 Last data filed at 01/01/2020 1110 Gross per 24 hour  Intake 1409.63 ml  Output 300 ml  Net 1109.63 ml   Filed Weights   12/31/19 1748 12/31/19 2303    Weight: 97.5 kg 98.9 kg    Examination:  General exam: Appears calm and comfortable Respiratory system: Severely diminished with very faint wheezing. Respiratory effort normal. Cardiovascular system: S1 & S2 heard, RRR. No murmurs, rubs, gallops or clicks. Gastrointestinal system: Abdomen is nondistended, soft and nontender. No organomegaly or masses felt. Normal bowel sounds heard. Central nervous system: Alert and oriented. No focal neurological deficits. Extremities: No edema. No calf tenderness Skin: No cyanosis. No rashes Psychiatry: Judgement and insight appear normal. Mood & affect appropriate.     Data Reviewed: I have personally reviewed following labs and imaging studies  CBC: Recent Labs  Lab 12/29/19 1535 12/31/19 1810 01/01/20 0329  WBC 7.5 11.9* 9.8  HGB 14.4 14.1 13.2  HCT 44.9 43.5 42.0  MCV 85.4 85.1 86.2  PLT 404* 380 [id number]   Basic Metabolic Panel: Recent Labs  Lab 12/29/19 1535 12/31/19 1810 01/01/20 0329  NA 141 141 140  K 3.7 4.0 3.7  CL 103 105 104  CO2 26 24 23   GLUCOSE 103* 116* 222*  BUN 5* 15 15  CREATININE 0.76 0.79 0.77  CALCIUM 9.7 9.6 9.2   GFR: Estimated Creatinine Clearance: 95.8 mL/min (by C-G formula based on SCr of 0.77 mg/dL). Liver Function Tests: Recent Labs  Lab 12/29/19 1535  AST 15  ALT 15  ALKPHOS 92  BILITOT 0.6  PROT 6.7  ALBUMIN 3.7   No results for input(s): LIPASE, AMYLASE in the last 168 hours. No results for input(s): AMMONIA in the last 168 hours. Coagulation Profile: No results for  input(s): INR, PROTIME in the last 168 hours. Cardiac Enzymes: No results for input(s): CKTOTAL, CKMB, CKMBINDEX, TROPONINI in the last 168 hours. BNP (last 3 results) No results for input(s): PROBNP in the last 8760 hours. HbA1C: No results for input(s): HGBA1C in the last 72 hours. CBG: No results for input(s): GLUCAP in the last 168 hours. Lipid Profile: No results for input(s): CHOL, HDL, LDLCALC, TRIG, CHOLHDL,  LDLDIRECT in the last 72 hours. Thyroid Function Tests: No results for input(s): TSH, T4TOTAL, FREET4, T3FREE, THYROIDAB in the last 72 hours. Anemia Panel: No results for input(s): VITAMINB12, FOLATE, FERRITIN, TIBC, IRON, RETICCTPCT in the last 72 hours. Sepsis Labs: No results for input(s): PROCALCITON, LATICACIDVEN in the last 168 hours.  Recent Results (from the past 240 hour(s))  Wet prep, genital     Status: Abnormal   Collection Time: 12/30/19  6:57 AM   Specimen: PATH Cytology Cervicovaginal Ancillary Only  Result Value Ref Range Status   Yeast Wet Prep HPF POC NONE SEEN NONE SEEN Final   Trich, Wet Prep NONE SEEN NONE SEEN Final   Clue Cells Wet Prep HPF POC PRESENT (A) NONE SEEN Final   WBC, Wet Prep HPF POC MANY (A) NONE SEEN Final    Comment: Specimen diluted due to transport tube containing more than 1 ml of saline, interpret results with caution.   Sperm NONE SEEN  Final    Comment: Performed at [facility], [place] [address] [place], [place] [address]  SARS CORONAVIRUS 2 (TAT 6-24 HRS)     Status: None   Collection Time: 12/30/19  6:57 AM  Result Value Ref Range Status   SARS Coronavirus 2 NEGATIVE NEGATIVE Final    Comment: (NOTE) SARS-CoV-2 target nucleic acids are NOT DETECTED. The SARS-CoV-2 RNA is generally detectable in upper and lower respiratory specimens during the acute phase of infection. Negative results do not preclude SARS-CoV-2 infection, do not rule out co-infections with other pathogens, and should not be used as the sole basis for treatment or other patient management decisions. Negative results must be combined with clinical observations, patient history, and epidemiological information. The expected result is Negative. Fact Sheet for Patients: SugarRoll.be Fact Sheet for Healthcare Providers: https://www.woods-mathews.com/ This test is not yet approved or cleared by the [place] FDA and  has  been authorized for detection and/or diagnosis of SARS-CoV-2 by FDA under an Emergency Use Authorization (EUA). This EUA will remain  in effect (meaning this test can be used) for the duration of the COVID-19 declaration under Section 56 4(b)(1) of the Act, 21 U.S.C. section 360bbb-3(b)(1), unless the authorization is terminated or revoked sooner. Performed at [facility], [place] [address], [place], [place] [address]   Urine culture     Status: Abnormal   Collection Time: 12/30/19  8:23 AM   Specimen: Urine, Random  Result Value Ref Range Status   Specimen Description URINE, RANDOM  Final   Special Requests   Final    NONE Performed at [facility], [place] [address], [place], [place] [address]    Culture MULTIPLE SPECIES PRESENT, SUGGEST RECOLLECTION (A)  Final   Report Status 12/31/2019 FINAL  Final  SARS CORONAVIRUS 2 (TAT 6-24 HRS) Nasopharyngeal Nasopharyngeal Swab     Status: None   Collection Time: 12/31/19  9:37 PM   Specimen: Nasopharyngeal Swab  Result Value Ref Range Status   SARS Coronavirus 2 NEGATIVE NEGATIVE Final    Comment: (NOTE) SARS-CoV-2 target nucleic acids are  NOT DETECTED. The SARS-CoV-2 RNA is generally detectable in upper and lower respiratory specimens during the acute phase of infection. Negative results do not preclude SARS-CoV-2 infection, do not rule out co-infections with other pathogens, and should not be used as the sole basis for treatment or other patient management decisions. Negative results must be combined with clinical observations, patient history, and epidemiological information. The expected result is Negative. Fact Sheet for Patients: SugarRoll.be Fact Sheet for Healthcare Providers: https://www.woods-mathews.com/ This test is not yet approved or cleared by the [place] FDA and  has been authorized for detection and/or diagnosis of SARS-CoV-2 by FDA under an Emergency Use  Authorization (EUA). This EUA will remain  in effect (meaning this test can be used) for the duration of the COVID-19 declaration under Section 56 4(b)(1) of the Act, 21 U.S.C. section 360bbb-3(b)(1), unless the authorization is terminated or revoked sooner. Performed at [facility], [place] [address] [place], [place] [address]          Radiology Studies: DG Chest 2 View  Result Date: 12/31/2019 CLINICAL DATA:  Chest pain and short of breath EXAM: CHEST - 2 VIEW COMPARISON:  12/29/2019 FINDINGS: No focal opacity or pleural effusion. Stable cardiomediastinal silhouette. No pneumothorax. IMPRESSION: No active cardiopulmonary disease. Electronically Signed   By: Donavan Foil M.D.   On: 12/31/2019 19:21        Scheduled Meds: . albuterol  2.5 mg Nebulization Q4H  . azithromycin  250 mg Oral Daily  . enoxaparin (LOVENOX) injection  40 mg Subcutaneous Q24H  . fluticasone  2 spray Each Nare Daily  . fluticasone furoate-vilanterol  1 puff Inhalation Daily  . methylPREDNISolone (SOLU-MEDROL) injection  60 mg Intravenous Q12H  . metroNIDAZOLE  500 mg Oral BID  . mometasone-formoterol  2 puff Inhalation BID  . montelukast  10 mg Oral QHS  . nicotine  21 mg Transdermal Daily  . pantoprazole  40 mg Oral Daily   Continuous Infusions: . sodium chloride Stopped (12/31/19 2355)  . cefTRIAXone (ROCEPHIN)  IV Stopped (01/01/20 0027)     LOS: 0 days     Cordelia Poche, MD [facility] 01/01/2020, 1:48 PM  If 7PM-7AM, please contact night-coverage [email or web address]

## 2020-01-01 NOTE — Progress Notes (Signed)
Pt ambulated in the Dana Kennedy on room air with pulse ox monitor.  Sats stayed at 96%.  Pt did not seem in any distress and stated she only felt slightly short of breath.  No coughing while walking.

## 2020-01-02 DIAGNOSIS — N760 Acute vaginitis: Secondary | ICD-10-CM | POA: Diagnosis not present

## 2020-01-02 DIAGNOSIS — B9689 Other specified bacterial agents as the cause of diseases classified elsewhere: Secondary | ICD-10-CM | POA: Diagnosis not present

## 2020-01-02 DIAGNOSIS — J45901 Unspecified asthma with (acute) exacerbation: Secondary | ICD-10-CM | POA: Diagnosis not present

## 2020-01-02 LAB — TROPONIN I (HIGH SENSITIVITY): Troponin I (High Sensitivity): <2 ng/L (ref <18)

## 2020-01-02 MED ORDER — NICOTINE 21 MG/24HR TD PT24: 21.0000 mg | MEDICATED_PATCH | Freq: Every day | TRANSDERMAL | 0 refills | Status: DC

## 2020-01-02 MED ORDER — PREDNISONE 10 MG PO TABS: ORAL_TABLET | ORAL | 0 refills | Status: AC

## 2020-01-02 MED ORDER — ALBUTEROL SULFATE (2.5 MG/3ML) 0.083% IN NEBU: INHALATION_SOLUTION | RESPIRATORY_TRACT | 0 refills | Status: DC

## 2020-01-02 MED ORDER — LIP MEDEX EX OINT
TOPICAL_OINTMENT | CUTANEOUS | Status: AC
  Filled 2020-01-02: qty 7

## 2020-01-02 MED ORDER — HYDROCHLOROTHIAZIDE 12.5 MG PO CAPS: 12.5000 mg | ORAL_CAPSULE | Freq: Every day | ORAL | 0 refills | Status: DC

## 2020-01-02 MED ORDER — MONTELUKAST SODIUM 10 MG PO TABS: 10.0000 mg | ORAL_TABLET | Freq: Every day | ORAL | 0 refills | Status: DC

## 2020-01-02 MED ORDER — MORPHINE SULFATE (PF) 2 MG/ML IV SOLN
2.0000 mg | Freq: Once | INTRAVENOUS | Status: AC
  Administered 2020-01-02: 2 mg via INTRAVENOUS
  Filled 2020-01-02 (×2): qty 1

## 2020-01-02 NOTE — Progress Notes (Signed)
PROGRESS NOTE    Dana Kennedy  [id number] DOB: 14-Apr-1966 DOA: 12/31/2019 PCP: [facility]   Brief Narrative: Dana Kennedy is a 54 y.o. female with a history of asthma. Patient presented secondary to worsening dyspnea concerning for an asthma exacerbation. COVID-19 testing was negative. Started on treatment for asthma exacerbation.   Assessment & Plan:   Principal Problem:   Asthma exacerbation Active Problems:   Asthma   Acute lower UTI   Bacterial vaginosis   CAP (community acquired pneumonia)   Tobacco use   Asthma exacerbation Patient initially treated with Solu-medrol, albuterol inhaler. Exacerbation likely contributed to by patient's tobacco use; possible she could have a component of COPD but no official diagnosis. Seasonal allergies also seem to contribute. Patient not at baseline functionally. -Continue albuterol nebulizer treatments q4 hours scheduled -Continue Solu-medrol IV, transition to prednisone tomorrow if lungs continue to clear -Continue azithromycin  Bacterial vaginosis -Continue Flagyl 500 mg BID  Concern for UTI No symptoms. Urine culture with multiple organisms.  Chest pain Occurrence overnight. Patient states she has had these episodes since her significant coughing. High sensitivity troponin negative overnight. EKG unremarkable for evidence of ACS.  Lightheadedness on ambulation -Will obtain orthostatic vital signs  Tobacco use -Continue nicotine patch   DVT prophylaxis: Lovenox Code Status:   Code Status: Full Code Family Communication: None at bedside Disposition Plan: Discharge in 24 hours if clinically improved and functionally closer to baseline   Consultants:   None  Procedures:   None  Antimicrobials:  Ceftriaxone  Azithromycin    Subjective: Still with some dyspnea on exertion and lightheadedness. Not at baseline functionally.  Objective: Vitals:   01/02/20  0411 01/02/20 0505 01/02/20 0741 01/02/20 1127  BP:  (!) 152/95    Pulse:  93    Resp:  16    Temp:  97.9 F (36.6 C)    TempSrc:  Oral    SpO2: 95% 96% 100% 97%  Weight:      Height:        Intake/Output Summary (Last 24 hours) at 01/02/2020 1136 Last data filed at 01/02/2020 1055 Gross per 24 hour  Intake 1215.54 ml  Output --  Net 1215.54 ml   Filed Weights   12/31/19 1748 12/31/19 2303  Weight: 97.5 kg 98.9 kg    Examination:  General exam: Appears calm and comfortable Respiratory system: improved air movement. Diffuse wheezing. Normal respiratory effort Cardiovascular system: S1 & S2 heard, RRR. No murmurs, rubs, gallops or clicks. Gastrointestinal system: Abdomen is nondistended, soft and nontender. No organomegaly or masses felt. Normal bowel sounds heard. Central nervous system: Alert and oriented. No focal neurological deficits. Extremities: No edema. No calf tenderness Skin: No cyanosis. No rashes Psychiatry: Judgement and insight appear normal. Mood & affect appropriate.     Data Reviewed: I have personally reviewed following labs and imaging studies  CBC: Recent Labs  Lab 12/29/19 1535 12/31/19 1810 01/01/20 0329  WBC 7.5 11.9* 9.8  HGB 14.4 14.1 13.2  HCT 44.9 43.5 42.0  MCV 85.4 85.1 86.2  PLT 404* 380 [id number]   Basic Metabolic Panel: Recent Labs  Lab 12/29/19 1535 12/31/19 1810 01/01/20 0329  NA 141 141 140  K 3.7 4.0 3.7  CL 103 105 104  CO2 26 24 23   GLUCOSE 103* 116* 222*  BUN 5* 15 15  CREATININE 0.76 0.79 0.77  CALCIUM 9.7 9.6 9.2   GFR: Estimated Creatinine Clearance: 95.8 mL/min (by C-G formula based on  SCr of 0.77 mg/dL). Liver Function Tests: Recent Labs  Lab 12/29/19 1535  AST 15  ALT 15  ALKPHOS 92  BILITOT 0.6  PROT 6.7  ALBUMIN 3.7   No results for input(s): LIPASE, AMYLASE in the last 168 hours. No results for input(s): AMMONIA in the last 168 hours. Coagulation Profile: No results for input(s): INR, PROTIME in  the last 168 hours. Cardiac Enzymes: No results for input(s): CKTOTAL, CKMB, CKMBINDEX, TROPONINI in the last 168 hours. BNP (last 3 results) No results for input(s): PROBNP in the last 8760 hours. HbA1C: No results for input(s): HGBA1C in the last 72 hours. CBG: No results for input(s): GLUCAP in the last 168 hours. Lipid Profile: No results for input(s): CHOL, HDL, LDLCALC, TRIG, CHOLHDL, LDLDIRECT in the last 72 hours. Thyroid Function Tests: No results for input(s): TSH, T4TOTAL, FREET4, T3FREE, THYROIDAB in the last 72 hours. Anemia Panel: No results for input(s): VITAMINB12, FOLATE, FERRITIN, TIBC, IRON, RETICCTPCT in the last 72 hours. Sepsis Labs: No results for input(s): PROCALCITON, LATICACIDVEN in the last 168 hours.  Recent Results (from the past 240 hour(s))  Wet prep, genital     Status: Abnormal   Collection Time: 12/30/19  6:57 AM   Specimen: PATH Cytology Cervicovaginal Ancillary Only  Result Value Ref Range Status   Yeast Wet Prep HPF POC NONE SEEN NONE SEEN Final   Trich, Wet Prep NONE SEEN NONE SEEN Final   Clue Cells Wet Prep HPF POC PRESENT (A) NONE SEEN Final   WBC, Wet Prep HPF POC MANY (A) NONE SEEN Final    Comment: Specimen diluted due to transport tube containing more than 1 ml of saline, interpret results with caution.   Sperm NONE SEEN  Final    Comment: Performed at [facility], [place] [address] [place], [place] [address]  SARS CORONAVIRUS 2 (TAT 6-24 HRS)     Status: None   Collection Time: 12/30/19  6:57 AM  Result Value Ref Range Status   SARS Coronavirus 2 NEGATIVE NEGATIVE Final    Comment: (NOTE) SARS-CoV-2 target nucleic acids are NOT DETECTED. The SARS-CoV-2 RNA is generally detectable in upper and lower respiratory specimens during the acute phase of infection. Negative results do not preclude SARS-CoV-2 infection, do not rule out co-infections with other pathogens, and should not be used as the sole basis for treatment or other  patient management decisions. Negative results must be combined with clinical observations, patient history, and epidemiological information. The expected result is Negative. Fact Sheet for Patients: SugarRoll.be Fact Sheet for Healthcare Providers: https://www.woods-mathews.com/ This test is not yet approved or cleared by the [place] FDA and  has been authorized for detection and/or diagnosis of SARS-CoV-2 by FDA under an Emergency Use Authorization (EUA). This EUA will remain  in effect (meaning this test can be used) for the duration of the COVID-19 declaration under Section 56 4(b)(1) of the Act, 21 U.S.C. section 360bbb-3(b)(1), unless the authorization is terminated or revoked sooner. Performed at [facility], [place] [address], [place], [place] [address]   Urine culture     Status: Abnormal   Collection Time: 12/30/19  8:23 AM   Specimen: Urine, Random  Result Value Ref Range Status   Specimen Description URINE, RANDOM  Final   Special Requests   Final    NONE Performed at [facility], [place] [address], [place], [place] [address]    Culture MULTIPLE SPECIES PRESENT, SUGGEST RECOLLECTION (A)  Final  Report Status 12/31/2019 FINAL  Final  SARS CORONAVIRUS 2 (TAT 6-24 HRS) Nasopharyngeal Nasopharyngeal Swab     Status: None   Collection Time: 12/31/19  9:37 PM   Specimen: Nasopharyngeal Swab  Result Value Ref Range Status   SARS Coronavirus 2 NEGATIVE NEGATIVE Final    Comment: (NOTE) SARS-CoV-2 target nucleic acids are NOT DETECTED. The SARS-CoV-2 RNA is generally detectable in upper and lower respiratory specimens during the acute phase of infection. Negative results do not preclude SARS-CoV-2 infection, do not rule out co-infections with other pathogens, and should not be used as the sole basis for treatment or other patient management decisions. Negative results must be combined with clinical  observations, patient history, and epidemiological information. The expected result is Negative. Fact Sheet for Patients: SugarRoll.be Fact Sheet for Healthcare Providers: https://www.woods-mathews.com/ This test is not yet approved or cleared by the [place] FDA and  has been authorized for detection and/or diagnosis of SARS-CoV-2 by FDA under an Emergency Use Authorization (EUA). This EUA will remain  in effect (meaning this test can be used) for the duration of the COVID-19 declaration under Section 56 4(b)(1) of the Act, 21 U.S.C. section 360bbb-3(b)(1), unless the authorization is terminated or revoked sooner. Performed at [facility], [place] [address] [place], [place] [address]          Radiology Studies: DG Chest 2 View  Result Date: 12/31/2019 CLINICAL DATA:  Chest pain and short of breath EXAM: CHEST - 2 VIEW COMPARISON:  12/29/2019 FINDINGS: No focal opacity or pleural effusion. Stable cardiomediastinal silhouette. No pneumothorax. IMPRESSION: No active cardiopulmonary disease. Electronically Signed   By: Donavan Foil M.D.   On: 12/31/2019 19:21        Scheduled Meds: . albuterol  2.5 mg Nebulization Q4H  . azithromycin  250 mg Oral Daily  . enoxaparin (LOVENOX) injection  40 mg Subcutaneous Q24H  . fluticasone  2 spray Each Nare Daily  . fluticasone furoate-vilanterol  1 puff Inhalation Daily  . lip balm      . methylPREDNISolone (SOLU-MEDROL) injection  60 mg Intravenous Q12H  . metroNIDAZOLE  500 mg Oral BID  . mometasone-formoterol  2 puff Inhalation BID  . montelukast  10 mg Oral QHS  . nicotine  21 mg Transdermal Daily  . pantoprazole  40 mg Oral Daily   Continuous Infusions: . sodium chloride Stopped (01/02/20 0157)  . cefTRIAXone (ROCEPHIN)  IV Stopped (01/01/20 2226)     LOS: 1 day     Cordelia Poche, MD [facility] 01/02/2020, 11:36 AM  If 7PM-7AM, please contact  night-coverage [email or web address]

## 2020-01-02 NOTE — Discharge Instructions (Signed)
Dana Kennedy,  You were in the hospital with an asthma exacerbation. Please continue nebulizer treatments and steroids. You also developed very high blood pressure. I have started you on a blood pressure medication but please follow-up with your physician next week. Please buy a blood pressure monitor from your pharmacy and check your pressure daily.

## 2020-01-02 NOTE — Progress Notes (Signed)
Pt around 12:20am was complaining of chest pain 10/10 located around the rt chest/breast area. Notified MD. New orders for stat EKG & 2mg  morphine IV x1. Notified rapid response nurse to have them come take a look at pt. O2 94% on RA, BP 157/95 P93. Chest pain not radiating anywhere else & pt is not SOB. Will continue to monitor.

## 2020-01-02 NOTE — Discharge Summary (Signed)
Physician Discharge Summary  [name] [id number] DOB: 07-16-1966 DOA: 12/31/2019  PCP: [facility] [place] date: 12/31/2019 Discharge date: 01/02/2020  Admitted From: Home Disposition: Home  Recommendations for Outpatient Follow-up:  1. Follow up with PCP in 1 week 2. Please obtain BMP/CBC in one week 3. Please follow up on the following pending results: None  Home Health: None Equipment/Devices: Nebulizer  Discharge Condition: Guarded CODE STATUS: Full code Diet recommendation: Heart healthy   Brief/Interim Summary:  Admission HPI written by Etta Quill, DO   Chief Complaint: Worsening SOB  HPI: Dana Kennedy is a 54 y.o. female with medical history significant of Asthma.  Pt seen in ED on 4/12 for SOB and wheezing.  Also vaginal discharge.  Diagnosed with: 1) multifocal PNA, COVID rapid and PCR were both negative 2) Asthma exacerbation 3) BV 4) UTI - though culture grew out multiple organisms  Pt sent home on Amoxicillin, azithromycin, flagyl, and prednisone, as well as inhalers.  Pts symptoms have continued to worsen with worsening wheezing and SOB despite treatment.  Returns to ED.   Hospital course:  Asthma exacerbation Patient initially treated with Solu-medrol, albuterol inhaler. Exacerbation likely contributed to by patient's tobacco use; possible she could have a component of COPD but no official diagnosis. Seasonal allergies also seem to contribute. Patient closer to baseline and stable on room air prior to discharge. Discharged with albuterol nebulizer treatments q4 hours, prednisone taper. Continue home Breo Ellipta. Completed antibiotics prior to discharge.  Bacterial vaginosis Continue Flagyl 500 mg BID  Concern for UTI No symptoms. Urine culture with multiple organisms.  Chest pain Occurrence overnight. Patient states she has had these episodes since her significant coughing. High  sensitivity troponin negative overnight. EKG unremarkable for evidence of ACS.  Lightheadedness on ambulation Improved with treatment of asthma exacerbation  Tobacco use Nicotine patch  Hypertensive urgency New issue. BP as high as 160s/100s. No red flags. Patient started on amlodipine. Patient recommended to stay overnight for continued management of uncontrolled blood pressure but patient requested discharge. Recommended for patient to follow-up with her primary care physician.   Discharge Diagnoses:  Principal Problem:   Asthma exacerbation Active Problems:   Asthma   Acute lower UTI   Bacterial vaginosis   CAP (community acquired pneumonia)   Tobacco use    Discharge Instructions  Discharge Instructions    Diet - low sodium heart healthy   Complete by: As directed    Increase activity slowly   Complete by: As directed      Allergies as of 01/02/2020      Reactions   Aspirin    Causes bleeding   Eszopiclone Swelling   Felt like throat was closing. Not sure if it was related to this medication or not. lunesta   Gabapentin Swelling   Ankles swelling   Allegra [fexofenadine] Cough   Pt states she began having spells of dry cough after taking this medication   Restoril [temazepam] Other (See Comments)   Headaches       Medication List    STOP taking these medications   amoxicillin-clavulanate 875-125 MG tablet Commonly known as: AUGMENTIN   azithromycin 250 MG tablet Commonly known as: Zithromax Z-Pak   budesonide-formoterol 160-4.5 MCG/ACT inhaler Commonly known as: Symbicort     TAKE these medications   acetaminophen 325 MG tablet Commonly known as: TYLENOL Take 2 tablets (650 mg total) by mouth every 6 (six) hours as needed for mild  pain (or Fever >/= 101).   albuterol 108 (90 Base) MCG/ACT inhaler Commonly known as: VENTOLIN HFA Inhale 2 puffs into the lungs every 4 (four) hours as needed for wheezing or shortness of breath. What changed:  Another medication with the same name was added. Make sure you understand how and when to take each.   albuterol (2.5 MG/3ML) 0.083% nebulizer solution Commonly known as: PROVENTIL Take 3 mLs (2.5 mg total) by nebulization every 4 (four) hours for 2 days, THEN 3 mLs (2.5 mg total) every 6 (six) hours as needed for wheezing or shortness of breath. Start taking on: January 02, 2020 What changed: You were already taking a medication with the same name, and this prescription was added. Make sure you understand how and when to take each.   baclofen 10 MG tablet Commonly known as: LIORESAL Take 10 mg by mouth 3 (three) times daily as needed for muscle pain.   dicyclomine 20 MG tablet Commonly known as: BENTYL Take 1 tablet (20 mg total) by mouth every 8 (eight) hours as needed for spasms.   fluticasone 50 MCG/ACT nasal spray Commonly known as: FLONASE Place 2 sprays into both nostrils daily.   fluticasone furoate-vilanterol 200-25 MCG/INH Aepb Commonly known as: Breo Ellipta Inhale 1 puff into the lungs daily.   hydrochlorothiazide 12.5 MG capsule Commonly known as: Microzide Take 1 capsule (12.5 mg total) by mouth daily.   ibuprofen 800 MG tablet Commonly known as: ADVIL Take 1 tablet (800 mg total) by mouth every 8 (eight) hours as needed.   metroNIDAZOLE 500 MG tablet Commonly known as: FLAGYL Take 1 tablet (500 mg total) by mouth 2 (two) times daily.   montelukast 10 MG tablet Commonly known as: SINGULAIR Take 1 tablet (10 mg total) by mouth at bedtime.   nicotine 21 mg/24hr patch Commonly known as: NICODERM CQ - dosed in mg/24 hours Place 1 patch (21 mg total) onto the skin daily. Start taking on: January 03, 2020   oxyCODONE-acetaminophen 10-325 MG tablet Commonly known as: Percocet Take 1 tablet by mouth every 6 (six) hours as needed for pain.   pantoprazole 40 MG tablet Commonly known as: PROTONIX Take 1 tablet (40 mg total) by mouth daily.   predniSONE 10 MG  tablet Commonly known as: DELTASONE Take 4 tablets (40 mg total) by mouth daily for 3 days, THEN 2 tablets (20 mg total) daily for 2 days, THEN 1 tablet (10 mg total) daily for 2 days. Start taking on: January 02, 2020 What changed:   medication strength  See the new instructions.            Durable Medical Equipment  (From admission, onward)         Start     Ordered   01/02/20 1621  For home use only DME Nebulizer machine  Once    Question Answer Comment  Patient needs a nebulizer to treat with the following condition Asthma   Length of Need Lifetime      01/02/20 1620         Follow-up Information    [facility]. Schedule an appointment as soon as possible for a visit in 1 week(s).   Specialty: Family Medicine Why: Hospital follow-up Contact information: [address] [place] [address] [phone]          Allergies  Allergen Reactions  . Aspirin     Causes bleeding  . Eszopiclone Swelling    Felt like  throat was closing. Not sure if it was related to this medication or not. lunesta  . Gabapentin Swelling    Ankles swelling  . Allegra [Fexofenadine] Cough    Pt states she began having spells of dry cough after taking this medication  . Restoril [Temazepam] Other (See Comments)    Headaches     Consultations:  None   Procedures/Studies: DG Chest 2 View  Result Date: 12/31/2019 CLINICAL DATA:  Chest pain and short of breath EXAM: CHEST - 2 VIEW COMPARISON:  12/29/2019 FINDINGS: No focal opacity or pleural effusion. Stable cardiomediastinal silhouette. No pneumothorax. IMPRESSION: No active cardiopulmonary disease. Electronically Signed   By: Donavan Foil M.D.   On: 12/31/2019 19:21   DG Chest 2 View  Result Date: 12/29/2019 CLINICAL DATA:  Shortness of breath for 1 day, history of asthma and GERD, current smoker EXAM: CHEST - 2 VIEW COMPARISON:  Radiograph 12/25/2017 FINDINGS: Some increasing patchy  and interstitial opacities noted towards the lung bases and periphery. No pneumothorax or effusion. Cardiomediastinal contours are similar to prior exams accounting for differences in technique. No acute osseous or soft tissue abnormality. IMPRESSION: Increasing patchy and interstitial opacities towards the lung bases and periphery. Findings could represent developing infection or edema. Electronically Signed   By: Lovena Le M.D.   On: 12/29/2019 22:15      Subjective: Breathing better. Able to ambulate without dyspnea.  Discharge Exam: Vitals:   01/02/20 1603 01/02/20 1621  BP: (!) 166/102   Pulse: 91   Resp:    Temp:    SpO2:  95%   Vitals:   01/02/20 1446 01/02/20 1602 01/02/20 1603 01/02/20 1621  BP: (!) 164/106 (!) 172/106 (!) 166/102   Pulse: 98 96 91   Resp:  15    Temp: 98 F (36.7 C) 98.5 F (36.9 C)    TempSrc:  Oral    SpO2: 95% 98%  95%  Weight:      Height:        General: Pt is alert, awake, not in acute distress Cardiovascular: RRR, S1/S2 +, no rubs, no gallops Respiratory: Diffuse wheezing, normal respiratory effort Abdominal: Soft, NT, ND, bowel sounds + Extremities: no edema, no cyanosis    The results of significant diagnostics from this hospitalization (including imaging, microbiology, ancillary and laboratory) are listed below for reference.     Microbiology: Recent Results (from the past 240 hour(s))  Wet prep, genital     Status: Abnormal   Collection Time: 12/30/19  6:57 AM   Specimen: PATH Cytology Cervicovaginal Ancillary Only  Result Value Ref Range Status   Yeast Wet Prep HPF POC NONE SEEN NONE SEEN Final   Trich, Wet Prep NONE SEEN NONE SEEN Final   Clue Cells Wet Prep HPF POC PRESENT (A) NONE SEEN Final   WBC, Wet Prep HPF POC MANY (A) NONE SEEN Final    Comment: Specimen diluted due to transport tube containing more than 1 ml of saline, interpret results with caution.   Sperm NONE SEEN  Final    Comment: Performed at [facility], [place] [address] [place], [place] [address]  SARS CORONAVIRUS 2 (TAT 6-24 HRS)     Status: None   Collection Time: 12/30/19  6:57 AM  Result Value Ref Range Status   SARS Coronavirus 2 NEGATIVE NEGATIVE Final    Comment: (NOTE) SARS-CoV-2 target nucleic acids are NOT DETECTED. The SARS-CoV-2 RNA is generally detectable in upper and lower respiratory specimens during the  acute phase of infection. Negative results do not preclude SARS-CoV-2 infection, do not rule out co-infections with other pathogens, and should not be used as the sole basis for treatment or other patient management decisions. Negative results must be combined with clinical observations, patient history, and epidemiological information. The expected result is Negative. Fact Sheet for Patients: SugarRoll.be Fact Sheet for Healthcare Providers: https://www.woods-mathews.com/ This test is not yet approved or cleared by the [place] FDA and  has been authorized for detection and/or diagnosis of SARS-CoV-2 by FDA under an Emergency Use Authorization (EUA). This EUA will remain  in effect (meaning this test can be used) for the duration of the COVID-19 declaration under Section 56 4(b)(1) of the Act, 21 U.S.C. section 360bbb-3(b)(1), unless the authorization is terminated or revoked sooner. Performed at [facility], [place] [address], [place], [place] [address]   Urine culture     Status: Abnormal   Collection Time: 12/30/19  8:23 AM   Specimen: Urine, Random  Result Value Ref Range Status   Specimen Description URINE, RANDOM  Final   Special Requests   Final    NONE Performed at [facility], [place] [address] [place], [place] [address]    Culture MULTIPLE SPECIES PRESENT, SUGGEST RECOLLECTION (A)  Final   Report Status 12/31/2019 FINAL  Final  SARS CORONAVIRUS 2 (TAT 6-24 HRS) Nasopharyngeal Nasopharyngeal Swab     Status: None   Collection Time:  12/31/19  9:37 PM   Specimen: Nasopharyngeal Swab  Result Value Ref Range Status   SARS Coronavirus 2 NEGATIVE NEGATIVE Final    Comment: (NOTE) SARS-CoV-2 target nucleic acids are NOT DETECTED. The SARS-CoV-2 RNA is generally detectable in upper and lower respiratory specimens during the acute phase of infection. Negative results do not preclude SARS-CoV-2 infection, do not rule out co-infections with other pathogens, and should not be used as the sole basis for treatment or other patient management decisions. Negative results must be combined with clinical observations, patient history, and epidemiological information. The expected result is Negative. Fact Sheet for Patients: SugarRoll.be Fact Sheet for Healthcare Providers: https://www.woods-mathews.com/ This test is not yet approved or cleared by the [place] FDA and  has been authorized for detection and/or diagnosis of SARS-CoV-2 by FDA under an Emergency Use Authorization (EUA). This EUA will remain  in effect (meaning this test can be used) for the duration of the COVID-19 declaration under Section 56 4(b)(1) of the Act, 21 U.S.C. section 360bbb-3(b)(1), unless the authorization is terminated or revoked sooner. Performed at [facility], [place] [address], [place], [place] [address]      Labs: BNP (last 3 results) No results for input(s): BNP in the last 8760 hours. Basic Metabolic Panel: Recent Labs  Lab 12/29/19 1535 12/31/19 1810 01/01/20 0329  NA 141 141 140  K 3.7 4.0 3.7  CL 103 105 104  CO2 26 24 23   GLUCOSE 103* 116* 222*  BUN 5* 15 15  CREATININE 0.76 0.79 0.77  CALCIUM 9.7 9.6 9.2   Liver Function Tests: Recent Labs  Lab 12/29/19 1535  AST 15  ALT 15  ALKPHOS 92  BILITOT 0.6  PROT 6.7  ALBUMIN 3.7   No results for input(s): LIPASE, AMYLASE in the last 168 hours. No results for input(s): AMMONIA in the last 168 hours. CBC: Recent Labs  Lab  12/29/19 1535 12/31/19 1810 01/01/20 0329  WBC 7.5 11.9* 9.8  HGB 14.4 14.1 13.2  HCT 44.9 43.5 42.0  MCV 85.4 85.1 86.2  PLT 404* 380 359   Cardiac Enzymes: No results for input(s): CKTOTAL, CKMB, CKMBINDEX, TROPONINI in the last 168 hours. BNP: Invalid input(s): POCBNP CBG: No results for input(s): GLUCAP in the last 168 hours. D-Dimer Recent Labs    12/31/19 1815  DDIMER 0.36   Hgb A1c No results for input(s): HGBA1C in the last 72 hours. Lipid Profile No results for input(s): CHOL, HDL, LDLCALC, TRIG, CHOLHDL, LDLDIRECT in the last 72 hours. Thyroid function studies No results for input(s): TSH, T4TOTAL, T3FREE, THYROIDAB in the last 72 hours.  Invalid input(s): FREET3 Anemia work up No results for input(s): VITAMINB12, FOLATE, FERRITIN, TIBC, IRON, RETICCTPCT in the last 72 hours. Urinalysis    Component Value Date/Time   COLORURINE YELLOW 12/30/2019 0839   APPEARANCEUR HAZY (A) 12/30/2019 0839   LABSPEC 1.012 12/30/2019 0839   PHURINE 6.0 12/30/2019 0839   GLUCOSEU NEGATIVE 12/30/2019 0839   HGBUR MODERATE (A) 12/30/2019 0839   BILIRUBINUR NEGATIVE 12/30/2019 0839   KETONESUR NEGATIVE 12/30/2019 0839   PROTEINUR NEGATIVE 12/30/2019 0839   UROBILINOGEN 0.2 09/22/2014 2302   NITRITE NEGATIVE 12/30/2019 0839   LEUKOCYTESUR LARGE (A) 12/30/2019 0839   Sepsis Labs Invalid input(s): PROCALCITONIN,  WBC,  LACTICIDVEN Microbiology Recent Results (from the past 240 hour(s))  Wet prep, genital     Status: Abnormal   Collection Time: 12/30/19  6:57 AM   Specimen: PATH Cytology Cervicovaginal Ancillary Only  Result Value Ref Range Status   Yeast Wet Prep HPF POC NONE SEEN NONE SEEN Final   Trich, Wet Prep NONE SEEN NONE SEEN Final   Clue Cells Wet Prep HPF POC PRESENT (A) NONE SEEN Final   WBC, Wet Prep HPF POC MANY (A) NONE SEEN Final    Comment: Specimen diluted due to transport tube containing more than 1 ml of saline, interpret results with caution.   Sperm  NONE SEEN  Final    Comment: Performed at [facility], [place] [address] [place], [place] [address]  SARS CORONAVIRUS 2 (TAT 6-24 HRS)     Status: None   Collection Time: 12/30/19  6:57 AM  Result Value Ref Range Status   SARS Coronavirus 2 NEGATIVE NEGATIVE Final    Comment: (NOTE) SARS-CoV-2 target nucleic acids are NOT DETECTED. The SARS-CoV-2 RNA is generally detectable in upper and lower respiratory specimens during the acute phase of infection. Negative results do not preclude SARS-CoV-2 infection, do not rule out co-infections with other pathogens, and should not be used as the sole basis for treatment or other patient management decisions. Negative results must be combined with clinical observations, patient history, and epidemiological information. The expected result is Negative. Fact Sheet for Patients: SugarRoll.be Fact Sheet for Healthcare Providers: https://www.woods-mathews.com/ This test is not yet approved or cleared by the [place] FDA and  has been authorized for detection and/or diagnosis of SARS-CoV-2 by FDA under an Emergency Use Authorization (EUA). This EUA will remain  in effect (meaning this test can be used) for the duration of the COVID-19 declaration under Section 56 4(b)(1) of the Act, 21 U.S.C. section 360bbb-3(b)(1), unless the authorization is terminated or revoked sooner. Performed at [facility], [place] [address] [place], [place] [address]   Urine culture     Status: Abnormal   Collection Time: 12/30/19  8:23 AM   Specimen: Urine, Random  Result Value Ref Range Status   Specimen Description URINE, RANDOM  Final   Special Requests   Final  NONE Performed at [facility], [place] [address], [place], [place] [address]    Culture MULTIPLE SPECIES PRESENT, SUGGEST RECOLLECTION (A)  Final   Report Status 12/31/2019 FINAL  Final  SARS CORONAVIRUS 2 (TAT 6-24 HRS) Nasopharyngeal  Nasopharyngeal Swab     Status: None   Collection Time: 12/31/19  9:37 PM   Specimen: Nasopharyngeal Swab  Result Value Ref Range Status   SARS Coronavirus 2 NEGATIVE NEGATIVE Final    Comment: (NOTE) SARS-CoV-2 target nucleic acids are NOT DETECTED. The SARS-CoV-2 RNA is generally detectable in upper and lower respiratory specimens during the acute phase of infection. Negative results do not preclude SARS-CoV-2 infection, do not rule out co-infections with other pathogens, and should not be used as the sole basis for treatment or other patient management decisions. Negative results must be combined with clinical observations, patient history, and epidemiological information. The expected result is Negative. Fact Sheet for Patients: SugarRoll.be Fact Sheet for Healthcare Providers: https://www.woods-mathews.com/ This test is not yet approved or cleared by the [place] FDA and  has been authorized for detection and/or diagnosis of SARS-CoV-2 by FDA under an Emergency Use Authorization (EUA). This EUA will remain  in effect (meaning this test can be used) for the duration of the COVID-19 declaration under Section 56 4(b)(1) of the Act, 21 U.S.C. section 360bbb-3(b)(1), unless the authorization is terminated or revoked sooner. Performed at [facility], [place] [address] [place], [place] [address]      Time coordinating discharge: 35 minutes  SIGNED:   Cordelia Poche, MD [facility] 01/02/2020, 4:29 PM

## 2020-01-03 ENCOUNTER — Emergency Department (HOSPITAL_COMMUNITY)
Admission: EM | Admit: 2020-01-03 | Discharge: 2020-01-03 | Discharge status: Against Medical Advice | Payer: Medicaid Other

## 2020-01-03 NOTE — ED Notes (Signed)
Attempt made to get pt to triage no respond gotten.

## 2020-06-08 ENCOUNTER — Inpatient Hospital Stay: Payer: Medicaid Other | Attending: Hematology | Admitting: Hematology

## 2020-06-27 ENCOUNTER — Other Ambulatory Visit: Payer: Self-pay

## 2020-06-27 ENCOUNTER — Observation Stay (HOSPITAL_COMMUNITY)
Admission: EM | Admit: 2020-06-27 | Discharge: 2020-06-28 | DRG: 871 | Disposition: A | Discharge status: Home / Self Care | Payer: Medicaid Other | Attending: Family Medicine | Admitting: Family Medicine

## 2020-06-27 ENCOUNTER — Emergency Department (HOSPITAL_COMMUNITY): Payer: Medicaid Other

## 2020-06-27 ENCOUNTER — Encounter (HOSPITAL_COMMUNITY): Payer: Self-pay | Admitting: *Deleted

## 2020-06-27 DIAGNOSIS — J189 Pneumonia, unspecified organism: Principal | ICD-10-CM | POA: Diagnosis present

## 2020-06-27 DIAGNOSIS — J4521 Mild intermittent asthma with (acute) exacerbation: Secondary | ICD-10-CM | POA: Diagnosis not present

## 2020-06-27 DIAGNOSIS — J9601 Acute respiratory failure with hypoxia: Secondary | ICD-10-CM | POA: Diagnosis not present

## 2020-06-27 DIAGNOSIS — Z20822 Contact with and (suspected) exposure to covid-19: Secondary | ICD-10-CM | POA: Diagnosis present

## 2020-06-27 DIAGNOSIS — M329 Systemic lupus erythematosus, unspecified: Secondary | ICD-10-CM | POA: Diagnosis present

## 2020-06-27 DIAGNOSIS — Z7951 Long term (current) use of inhaled steroids: Secondary | ICD-10-CM | POA: Diagnosis not present

## 2020-06-27 DIAGNOSIS — G8929 Other chronic pain: Secondary | ICD-10-CM | POA: Diagnosis not present

## 2020-06-27 DIAGNOSIS — M545 Low back pain, unspecified: Secondary | ICD-10-CM | POA: Diagnosis present

## 2020-06-27 DIAGNOSIS — J440 Chronic obstructive pulmonary disease with acute lower respiratory infection: Secondary | ICD-10-CM | POA: Diagnosis not present

## 2020-06-27 DIAGNOSIS — M069 Rheumatoid arthritis, unspecified: Secondary | ICD-10-CM | POA: Diagnosis not present

## 2020-06-27 DIAGNOSIS — Z888 Allergy status to other drugs, medicaments and biological substances status: Secondary | ICD-10-CM | POA: Diagnosis not present

## 2020-06-27 DIAGNOSIS — Z79899 Other long term (current) drug therapy: Secondary | ICD-10-CM | POA: Diagnosis not present

## 2020-06-27 DIAGNOSIS — Z886 Allergy status to analgesic agent status: Secondary | ICD-10-CM

## 2020-06-27 DIAGNOSIS — Z720 Tobacco use: Secondary | ICD-10-CM | POA: Diagnosis present

## 2020-06-27 DIAGNOSIS — Z862 Personal history of diseases of the blood and blood-forming organs and certain disorders involving the immune mechanism: Secondary | ICD-10-CM

## 2020-06-27 DIAGNOSIS — J45901 Unspecified asthma with (acute) exacerbation: Secondary | ICD-10-CM

## 2020-06-27 DIAGNOSIS — F17210 Nicotine dependence, cigarettes, uncomplicated: Secondary | ICD-10-CM | POA: Diagnosis not present

## 2020-06-27 DIAGNOSIS — K219 Gastro-esophageal reflux disease without esophagitis: Secondary | ICD-10-CM | POA: Diagnosis not present

## 2020-06-27 DIAGNOSIS — E876 Hypokalemia: Secondary | ICD-10-CM | POA: Diagnosis not present

## 2020-06-27 DIAGNOSIS — A419 Sepsis, unspecified organism: Secondary | ICD-10-CM | POA: Diagnosis present

## 2020-06-27 LAB — URINALYSIS, ROUTINE W REFLEX MICROSCOPIC
Bacteria, UA: NONE SEEN
Bilirubin Urine: NEGATIVE
Glucose, UA: NEGATIVE mg/dL
Ketones, ur: NEGATIVE mg/dL
Nitrite: NEGATIVE
Protein, ur: NEGATIVE mg/dL
Specific Gravity, Urine: 1.003 — ABNORMAL LOW (ref 1.005–1.030)
pH: 6 (ref 5.0–8.0)

## 2020-06-27 LAB — COMPREHENSIVE METABOLIC PANEL
ALT: 13 U/L (ref 0–44)
AST: 17 U/L (ref 15–41)
Albumin: 4.3 g/dL (ref 3.5–5.0)
Alkaline Phosphatase: 74 U/L (ref 38–126)
Anion gap: 11 (ref 5–15)
BUN: <5 mg/dL — ABNORMAL LOW (ref 6–20)
CO2: 27 mmol/L (ref 22–32)
Calcium: 9.3 mg/dL (ref 8.9–10.3)
Chloride: 100 mmol/L (ref 98–111)
Creatinine, Ser: 0.66 mg/dL (ref 0.44–1.00)
GFR, Estimated: >60 mL/min (ref >60)
Glucose, Bld: 113 mg/dL — ABNORMAL HIGH (ref 70–99)
Potassium: 3.3 mmol/L — ABNORMAL LOW (ref 3.5–5.1)
Sodium: 138 mmol/L (ref 135–145)
Total Bilirubin: 0.5 mg/dL (ref 0.3–1.2)
Total Protein: 7.5 g/dL (ref 6.5–8.1)

## 2020-06-27 LAB — CBC WITH DIFFERENTIAL/PLATELET
Abs Immature Granulocytes: 0.05 10*3/uL (ref 0.00–0.07)
Basophils Absolute: 0 10*3/uL (ref 0.0–0.1)
Basophils Relative: 0 %
Eosinophils Absolute: 0.1 10*3/uL (ref 0.0–0.5)
Eosinophils Relative: 1 %
HCT: 44.7 % (ref 36.0–46.0)
Hemoglobin: 14.5 g/dL (ref 12.0–15.0)
Immature Granulocytes: 0 %
Lymphocytes Relative: 21 %
Lymphs Abs: 2.5 10*3/uL (ref 0.7–4.0)
MCH: 27.2 pg (ref 26.0–34.0)
MCHC: 32.4 g/dL (ref 30.0–36.0)
MCV: 83.9 fL (ref 80.0–100.0)
Monocytes Absolute: 1 10*3/uL (ref 0.1–1.0)
Monocytes Relative: 8 %
Neutro Abs: 8.4 10*3/uL — ABNORMAL HIGH (ref 1.7–7.7)
Neutrophils Relative %: 70 %
Platelets: 353 10*3/uL (ref 150–400)
RBC: 5.33 MIL/uL — ABNORMAL HIGH (ref 3.87–5.11)
RDW: 15.1 % (ref 11.5–15.5)
WBC: 12.1 10*3/uL — ABNORMAL HIGH (ref 4.0–10.5)
nRBC: 0 % (ref 0.0–0.2)

## 2020-06-27 LAB — RESPIRATORY PANEL BY RT PCR (FLU A&B, COVID)
Influenza A by PCR: NEGATIVE
Influenza B by PCR: NEGATIVE
SARS Coronavirus 2 by RT PCR: NEGATIVE

## 2020-06-27 LAB — I-STAT BETA HCG BLOOD, ED (MC, WL, AP ONLY): I-stat hCG, quantitative: <5 m[IU]/mL (ref <5)

## 2020-06-27 LAB — PROTIME-INR
INR: 1 (ref 0.8–1.2)
Prothrombin Time: 13.2 seconds (ref 11.4–15.2)

## 2020-06-27 LAB — LACTIC ACID, PLASMA: Lactic Acid, Venous: 1 mmol/L (ref 0.5–1.9)

## 2020-06-27 LAB — APTT: aPTT: 37 seconds — ABNORMAL HIGH (ref 24–36)

## 2020-06-27 MED ORDER — SODIUM CHLORIDE 0.9 % IV SOLN
2.0000 g | INTRAVENOUS | Status: DC
  Administered 2020-06-28: 2 g via INTRAVENOUS
  Filled 2020-06-27: qty 20

## 2020-06-27 MED ORDER — ENOXAPARIN SODIUM 40 MG/0.4ML ~~LOC~~ SOLN
40.0000 mg | SUBCUTANEOUS | Status: DC
  Administered 2020-06-28: 40 mg via SUBCUTANEOUS
  Filled 2020-06-27: qty 0.4

## 2020-06-27 MED ORDER — ONDANSETRON HCL 4 MG PO TABS: 4.0000 mg | ORAL_TABLET | Freq: Four times a day (QID) | ORAL | Status: DC | PRN

## 2020-06-27 MED ORDER — BACLOFEN 10 MG PO TABS: 10.0000 mg | ORAL_TABLET | Freq: Three times a day (TID) | ORAL | Status: DC | PRN

## 2020-06-27 MED ORDER — OXYCODONE-ACETAMINOPHEN 10-325 MG PO TABS: 1.0000 | ORAL_TABLET | Freq: Four times a day (QID) | ORAL | Status: DC | PRN

## 2020-06-27 MED ORDER — ACETAMINOPHEN 650 MG RE SUPP: 650.0000 mg | Freq: Four times a day (QID) | RECTAL | Status: DC | PRN

## 2020-06-27 MED ORDER — OXYCODONE-ACETAMINOPHEN 5-325 MG PO TABS: 1.0000 | ORAL_TABLET | Freq: Four times a day (QID) | ORAL | Status: DC | PRN

## 2020-06-27 MED ORDER — ACETAMINOPHEN 325 MG PO TABS
650.0000 mg | ORAL_TABLET | Freq: Four times a day (QID) | ORAL | Status: DC | PRN
  Administered 2020-06-28: 650 mg via ORAL

## 2020-06-27 MED ORDER — SODIUM CHLORIDE 0.9 % IV SOLN
500.0000 mg | INTRAVENOUS | Status: DC
  Administered 2020-06-28: 500 mg via INTRAVENOUS
  Filled 2020-06-27: qty 500

## 2020-06-27 MED ORDER — ALBUTEROL SULFATE HFA 108 (90 BASE) MCG/ACT IN AERS
6.0000 | INHALATION_SPRAY | Freq: Once | RESPIRATORY_TRACT | Status: AC
  Administered 2020-06-27: 6 via RESPIRATORY_TRACT
  Filled 2020-06-27: qty 6.7

## 2020-06-27 MED ORDER — DICYCLOMINE HCL 20 MG PO TABS: 20.0000 mg | ORAL_TABLET | Freq: Three times a day (TID) | ORAL | Status: DC | PRN

## 2020-06-27 MED ORDER — NICOTINE 21 MG/24HR TD PT24
21.0000 mg | MEDICATED_PATCH | Freq: Every day | TRANSDERMAL | Status: DC
  Administered 2020-06-28: 21 mg via TRANSDERMAL
  Filled 2020-06-27: qty 1

## 2020-06-27 MED ORDER — SODIUM CHLORIDE 0.9 % IV SOLN
2.0000 g | INTRAVENOUS | Status: DC
  Administered 2020-06-27: 2 g via INTRAVENOUS
  Filled 2020-06-27 (×2): qty 20

## 2020-06-27 MED ORDER — SODIUM CHLORIDE 0.9 % IV BOLUS
1000.0000 mL | Freq: Once | INTRAVENOUS | Status: AC
  Administered 2020-06-27: 1000 mL via INTRAVENOUS

## 2020-06-27 MED ORDER — ACETAMINOPHEN 325 MG PO TABS
650.0000 mg | ORAL_TABLET | Freq: Four times a day (QID) | ORAL | Status: DC | PRN
  Filled 2020-06-27: qty 2

## 2020-06-27 MED ORDER — METHYLPREDNISOLONE SODIUM SUCC 125 MG IJ SOLR
125.0000 mg | Freq: Once | INTRAMUSCULAR | Status: AC
  Administered 2020-06-27: 125 mg via INTRAVENOUS
  Filled 2020-06-27: qty 2

## 2020-06-27 MED ORDER — OXYCODONE HCL 5 MG PO TABS: 5.0000 mg | ORAL_TABLET | Freq: Four times a day (QID) | ORAL | Status: DC | PRN

## 2020-06-27 MED ORDER — IPRATROPIUM BROMIDE HFA 17 MCG/ACT IN AERS
2.0000 | INHALATION_SPRAY | Freq: Once | RESPIRATORY_TRACT | Status: AC
  Administered 2020-06-27: 2 via RESPIRATORY_TRACT
  Filled 2020-06-27: qty 12.9

## 2020-06-27 MED ORDER — ALBUTEROL SULFATE HFA 108 (90 BASE) MCG/ACT IN AERS: 2.0000 | INHALATION_SPRAY | RESPIRATORY_TRACT | Status: DC | PRN

## 2020-06-27 MED ORDER — SODIUM CHLORIDE 0.9 % IV SOLN
500.0000 mg | INTRAVENOUS | Status: DC
  Administered 2020-06-27: 500 mg via INTRAVENOUS
  Filled 2020-06-27 (×2): qty 500

## 2020-06-27 MED ORDER — LACTATED RINGERS IV SOLN: INTRAVENOUS | Status: DC

## 2020-06-27 MED ORDER — ONDANSETRON HCL 4 MG/2ML IJ SOLN: 4.0000 mg | Freq: Four times a day (QID) | INTRAMUSCULAR | Status: DC | PRN

## 2020-06-27 MED ORDER — ACETAMINOPHEN 325 MG PO TABS
650.0000 mg | ORAL_TABLET | Freq: Once | ORAL | Status: AC
  Administered 2020-06-27: 650 mg via ORAL
  Filled 2020-06-27: qty 2

## 2020-06-27 MED ORDER — HYDROCHLOROTHIAZIDE 12.5 MG PO CAPS
12.5000 mg | ORAL_CAPSULE | Freq: Every day | ORAL | Status: DC
  Administered 2020-06-28: 12.5 mg via ORAL
  Filled 2020-06-27: qty 1

## 2020-06-27 MED ORDER — MONTELUKAST SODIUM 10 MG PO TABS
10.0000 mg | ORAL_TABLET | Freq: Every day | ORAL | Status: DC
  Administered 2020-06-28: 10 mg via ORAL
  Filled 2020-06-27: qty 1

## 2020-06-27 MED ORDER — FLUTICASONE FUROATE-VILANTEROL 200-25 MCG/INH IN AEPB
1.0000 | INHALATION_SPRAY | Freq: Every day | RESPIRATORY_TRACT | Status: DC
  Filled 2020-06-27: qty 28

## 2020-06-27 NOTE — H&P (Signed)
History and Physical   Dana Kennedy VOH:[id number] DOB: 1965/12/15 DOA: 06/27/2020  Referring MD/NP/PA: Dr. Ronnald Nian  PCP: [place]   Outpatient Specialists: None  Patient coming from: Home  Chief Complaint: Shortness of breath and fever for 3 days  HPI: Dana Kennedy is a 54 y.o. female with medical history significant of asthma, tobacco abuse, previous pneumonias, GERD, rheumatoid arthritis, lupus, diverticular disease and Evans syndrome who presented to the ER with 2 to 3 days of shortness of breath cough and malaise.  Patient has received the first dose of her Covid vaccine.  She had no confirmed exposure.  Was having fever and chills in association with her symptoms.  Denied any nausea vomiting or diarrhea.  Patient was seen in the ER evaluated and found to have infiltrates consistent with pneumonia.  COVID-19 screen is negative.  She is wheezing and appears to meet sepsis criteria by temperature, pulse and respiratory rate.  Also by her white count.  Patient is therefore being admitted to the hospital with sepsis.  Lactic acid is only 1..  ED Course: Temperature 100.7 blood pressure 150/93 pulse 110 respirate 28 oxygen sats 90% on room air.  White count 12.1 hemoglobin 14.5 and platelets counts of 353 sodium 138 potassium 3.3 chloride 100 CO2 27 BUN less than 5 and creatinine 0.6 his glucose 113.  Lactic acid 1.0.  COVID-19 screen is negative.  Chest x-ray shows central right upper lobe airspace opacity suspicious for pneumonia.  Patient will be admitted for sepsis due to community-acquired pneumonia.  Review of Systems: As per HPI otherwise 10 point review of systems negative.    Past Medical History:  Diagnosis Date  . Anemia   . Asthma   . Chest pain    atypcial chest pain evaluation 07/10/18 Philbert Riser, Coletta Memos, MD), echo order (NL LVEF, wall motion 09/2018)  . Chronic lower back pain   . Diverticulitis   . GERD  (gastroesophageal reflux disease)   . History of blood transfusion    "HgB low w/lupus flareup" (08/19/2018)  . History of ITP 2014   NOT PROBLEM NOW  . Rheumatoid arthritis ([place])    "legs; all my bones" (08/19/2018)  . SLE (systemic lupus erythematosus) ([place])   . Umbilical hernia     Past Surgical History:  Procedure Laterality Date  . HERNIA REPAIR    . PARTIAL COLECTOMY N/A 10/24/2018   Procedure: PARTIAL COLON RESECTION WITH ANASTOMOSIS; APPENDECTOMY  ERAS PATHWAY SPLENIC FLEXURE MOBILIZATION; UMBILICAL HERNIA REPAIR;  Surgeon: Kinsinger, Arta Bruce, MD;  Location: [facility];  Service: General;  Laterality: N/A;  . stomach ulcer surgery  2008  . TONSILLECTOMY    . TOTAL HIP ARTHROPLASTY  06/14/2012   Procedure: TOTAL HIP ARTHROPLASTY ANTERIOR APPROACH;  Surgeon: Mcarthur Rossetti, MD;  Location: [facility];  Service: Orthopedics;  Laterality: Left;  Left Total Hip Arthroplasty, Anterior Approach C-Arm  . TUBAL LIGATION    . UMBILICAL HERNIA REPAIR       reports that she has been smoking cigarettes. She has been smoking about 0.50 packs per day. She has never used smokeless tobacco. She reports current alcohol use. She reports that she does not use drugs.  Allergies  Allergen Reactions  . Aspirin     Causes bleeding  . Eszopiclone Swelling    Felt like throat was closing. Not sure if it was related to this medication or not. lunesta  . Gabapentin Swelling    Ankles swelling  . Allegra [Fexofenadine]  Cough    Pt states she began having spells of dry cough after taking this medication  . Restoril [Temazepam] Other (See Comments)    Headaches     Family History  Problem Relation Age of Onset  . Cancer Mother   . Hypertension Mother   . Heart attack Mother   . Liver disease Father   . Hypertension Brother      Prior to Admission medications   Medication Sig Start Date End Date Taking? Authorizing Provider  acetaminophen (TYLENOL) 325 MG tablet Take 2 tablets (650 mg total)  by mouth every 6 (six) hours as needed for mild pain (or Fever >/= 101). 08/26/18  Yes Emokpae, Courage, MD  albuterol (VENTOLIN HFA) 108 (90 Base) MCG/ACT inhaler Inhale 2 puffs into the lungs every 4 (four) hours as needed for wheezing or shortness of breath. 12/30/19  Yes Albrizze, Kaitlyn E, PA-C  baclofen (LIORESAL) 10 MG tablet Take 10 mg by mouth 3 (three) times daily as needed for muscle pain. 11/26/19  Yes [provider]  dicyclomine (BENTYL) 20 MG tablet Take 1 tablet (20 mg total) by mouth every 8 (eight) hours as needed for spasms. 02/18/19  Yes Davonna Belling, MD  fluticasone furoate-vilanterol (BREO ELLIPTA) 200-25 MCG/INH AEPB Inhale 1 puff into the lungs daily. 06/10/18  Yes Mannam, Praveen, MD  montelukast (SINGULAIR) 10 MG tablet Take 1 tablet (10 mg total) by mouth at bedtime. 01/02/20  Yes Mariel Aloe, MD  oxyCODONE-acetaminophen (PERCOCET) 10-325 MG tablet Take 1 tablet by mouth every 6 (six) hours as needed for pain. 07/20/19  Yes Carmin Muskrat, MD  albuterol (PROVENTIL) (2.5 MG/3ML) 0.083% nebulizer solution Take 3 mLs (2.5 mg total) by nebulization every 4 (four) hours for 2 days, THEN 3 mLs (2.5 mg total) every 6 (six) hours as needed for wheezing or shortness of breath. Patient not taking: Reported on 06/27/2020 01/02/20 02/03/20  Mariel Aloe, MD  fluticasone [facility]) 50 MCG/ACT nasal spray Place 2 sprays into both nostrils daily. Patient not taking: Reported on 06/27/2020 11/12/17   Marshell Garfinkel, MD  hydrochlorothiazide (MICROZIDE) 12.5 MG capsule Take 1 capsule (12.5 mg total) by mouth daily. 01/02/20 02/01/20  Mariel Aloe, MD  ibuprofen (ADVIL,MOTRIN) 800 MG tablet Take 1 tablet (800 mg total) by mouth every 8 (eight) hours as needed. Patient not taking: Reported on 06/27/2020 10/29/18   Kinsinger, Arta Bruce, MD  metroNIDAZOLE (FLAGYL) 500 MG tablet Take 1 tablet (500 mg total) by mouth 2 (two) times daily. Patient not taking: Reported on 06/27/2020  12/30/19   Albrizze, Verline Lema E, PA-C  nicotine (NICODERM CQ - DOSED IN MG/24 HOURS) 21 mg/24hr patch Place 1 patch (21 mg total) onto the skin daily. Patient not taking: Reported on 06/27/2020 01/03/20   Mariel Aloe, MD  pantoprazole (PROTONIX) 40 MG tablet Take 1 tablet (40 mg total) by mouth daily. Patient not taking: Reported on 06/27/2020 08/26/18   Roxan Hockey, MD    Physical Exam: Vitals:   06/27/20 1930 06/27/20 2007 06/27/20 2030 06/27/20 2100  BP: (!) 150/93  (!) 137/92 (!) 144/80  Pulse: (!) 106  (!) 102 (!) 102  Resp: (!) 23  18 (!) 23  Temp:  99.9 F (37.7 C)    TempSrc:  Oral    SpO2: 90%  90% 93%  Weight:      Height:          Constitutional: Acutely ill looking, mildly tachypneic Vitals:   06/27/20 1930 06/27/20 2007 06/27/20  2030 06/27/20 2100  BP: (!) 150/93  (!) 137/92 (!) 144/80  Pulse: (!) 106  (!) 102 (!) 102  Resp: (!) 23  18 (!) 23  Temp:  99.9 F (37.7 C)    TempSrc:  Oral    SpO2: 90%  90% 93%  Weight:      Height:       Eyes: PERRL, lids and conjunctivae normal ENMT: Mucous membranes are dry. Posterior pharynx clear of any exudate or lesions.Normal dentition.  Neck: normal, supple, no masses, no thyromegaly Respiratory: Decreased air entry with egophony, right less than left, mild expiratory wheezing and rhonchi normal respiratory effort. No accessory muscle use.  Cardiovascular: Sinus tachycardia, no murmurs / rubs / gallops. No extremity edema. 2+ pedal pulses. No carotid bruits.  Abdomen: no tenderness, no masses palpated. No hepatosplenomegaly. Bowel sounds positive.  Musculoskeletal: no clubbing / cyanosis. No joint deformity upper and lower extremities. Good ROM, no contractures. Normal muscle tone.  Skin: no rashes, lesions, ulcers. No induration Neurologic: CN 2-12 grossly intact. Sensation intact, DTR normal. Strength 5/5 in all 4.  Psychiatric: Normal judgment and insight. Alert and oriented x 3. Normal mood.     Labs on  Admission: I have personally reviewed following labs and imaging studies  CBC: Recent Labs  Lab 06/27/20 1901  WBC 12.1*  NEUTROABS 8.4*  HGB 14.5  HCT 44.7  MCV 83.9  PLT 801   Basic Metabolic Panel: Recent Labs  Lab 06/27/20 1901  NA 138  K 3.3*  CL 100  CO2 27  GLUCOSE 113*  BUN <5*  CREATININE 0.66  CALCIUM 9.3   GFR: Estimated Creatinine Clearance: 95.4 mL/min (by C-G formula based on SCr of 0.66 mg/dL). Liver Function Tests: Recent Labs  Lab 06/27/20 1901  AST 17  ALT 13  ALKPHOS 74  BILITOT 0.5  PROT 7.5  ALBUMIN 4.3   No results for input(s): LIPASE, AMYLASE in the last 168 hours. No results for input(s): AMMONIA in the last 168 hours. Coagulation Profile: Recent Labs  Lab 06/27/20 1901  INR 1.0   Cardiac Enzymes: No results for input(s): CKTOTAL, CKMB, CKMBINDEX, TROPONINI in the last 168 hours. BNP (last 3 results) No results for input(s): PROBNP in the last 8760 hours. HbA1C: No results for input(s): HGBA1C in the last 72 hours. CBG: No results for input(s): GLUCAP in the last 168 hours. Lipid Profile: No results for input(s): CHOL, HDL, LDLCALC, TRIG, CHOLHDL, LDLDIRECT in the last 72 hours. Thyroid Function Tests: No results for input(s): TSH, T4TOTAL, FREET4, T3FREE, THYROIDAB in the last 72 hours. Anemia Panel: No results for input(s): VITAMINB12, FOLATE, FERRITIN, TIBC, IRON, RETICCTPCT in the last 72 hours. Urine analysis:    Component Value Date/Time   COLORURINE STRAW (A) 06/27/2020 1817   APPEARANCEUR CLEAR 06/27/2020 1817   LABSPEC 1.003 (L) 06/27/2020 1817   PHURINE 6.0 06/27/2020 1817   GLUCOSEU NEGATIVE 06/27/2020 1817   HGBUR MODERATE (A) 06/27/2020 1817   BILIRUBINUR NEGATIVE 06/27/2020 1817   KETONESUR NEGATIVE 06/27/2020 1817   PROTEINUR NEGATIVE 06/27/2020 1817   UROBILINOGEN 0.2 09/22/2014 2302   NITRITE NEGATIVE 06/27/2020 1817   LEUKOCYTESUR LARGE (A) 06/27/2020 1817   Sepsis  Labs: @LABRCNTIP (procalcitonin:4,lacticidven:4) ) Recent Results (from the past 240 hour(s))  Respiratory Panel by RT PCR (Flu A&B, Covid) - Nasopharyngeal Swab     Status: None   Collection Time: 06/27/20  7:01 PM   Specimen: Nasopharyngeal Swab  Result Value Ref Range Status   SARS Coronavirus 2  by RT PCR NEGATIVE NEGATIVE Final    Comment: (NOTE) SARS-CoV-2 target nucleic acids are NOT DETECTED.  The SARS-CoV-2 RNA is generally detectable in upper respiratoy specimens during the acute phase of infection. The lowest concentration of SARS-CoV-2 viral copies this assay can detect is 131 copies/mL. A negative result does not preclude SARS-Cov-2 infection and should not be used as the sole basis for treatment or other patient management decisions. A negative result may occur with  improper specimen collection/handling, submission of specimen other than nasopharyngeal swab, presence of viral mutation(s) within the areas targeted by this assay, and inadequate number of viral copies (<131 copies/mL). A negative result must be combined with clinical observations, patient history, and epidemiological information. The expected result is Negative.  Fact Sheet for Patients:  PinkCheek.be  Fact Sheet for Healthcare Providers:  GravelBags.it  This test is no t yet approved or cleared by the [place] FDA and  has been authorized for detection and/or diagnosis of SARS-CoV-2 by FDA under an Emergency Use Authorization (EUA). This EUA will remain  in effect (meaning this test can be used) for the duration of the COVID-19 declaration under Section 564(b)(1) of the Act, 21 U.S.C. section 360bbb-3(b)(1), unless the authorization is terminated or revoked sooner.     Influenza A by PCR NEGATIVE NEGATIVE Final   Influenza B by PCR NEGATIVE NEGATIVE Final    Comment: (NOTE) The Xpert Xpress SARS-CoV-2/FLU/RSV assay is intended as an  aid in  the diagnosis of influenza from Nasopharyngeal swab specimens and  should not be used as a sole basis for treatment. Nasal washings and  aspirates are unacceptable for Xpert Xpress SARS-CoV-2/FLU/RSV  testing.  Fact Sheet for Patients: PinkCheek.be  Fact Sheet for Healthcare Providers: GravelBags.it  This test is not yet approved or cleared by the [place] FDA and  has been authorized for detection and/or diagnosis of SARS-CoV-2 by  FDA under an Emergency Use Authorization (EUA). This EUA will remain  in effect (meaning this test can be used) for the duration of the  Covid-19 declaration under Section 564(b)(1) of the Act, 21  U.S.C. section 360bbb-3(b)(1), unless the authorization is  terminated or revoked. Performed at [facility], [address] [place], [place] [address]      Radiological Exams on Admission: DG Chest Port 1 View  Result Date: 06/27/2020 CLINICAL DATA:  Chest pain and cough for 2 days.  Asthma. EXAM: PORTABLE CHEST 1 VIEW COMPARISON:  12/31/2019 FINDINGS: Heart size is within normal limits. Ectasia of the thoracic aorta is stable. Asymmetric airspace opacity is seen in the central right upper lobe, suspicious for pneumonia. No evidence of pleural effusion. IMPRESSION: Central right upper lobe airspace opacity, suspicious for pneumonia. Continued chest radiographic follow-up is recommended to confirm resolution. Electronically Signed   By: Marlaine Hind M.D.   On: 06/27/2020 19:20    EKG: Independently reviewed.  Sinus tachycardia otherwise no significant findings.  Assessment/Plan Principal Problem:   Sepsis ([place]) Active Problems:   Lupus (systemic lupus erythematosus) ([facility])   GERD (gastroesophageal reflux disease)   Asthma exacerbation   CAP (community acquired pneumonia)   Tobacco use   Hypokalemia     #1 sepsis: Secondary to community-acquired pneumonia.   Patient will be admitted.  Follow lactic.  Follow the sepsis protocol.  IV fluids resuscitation and antibiotics.  Follow culture results.  #2 community-acquired pneumonia: Initiate Rocephin and Zithromax.  Follow culture results.  #3 asthma with COPD: Patient has on going tobacco use.  Probably has COPD component to her asthma.  Initiate breathing treatments.  No significant wheezing.  Will consider steroids use if she starts wheezing.  #4 tobacco abuse: Initiate nicotine patch with counseling.  #5 hypokalemia: Replete potassium.  #6 GERD: Continue with PPIs.  #7 history of lupus: Stable.  Continue home regimen    DVT prophylaxis: Lovenox Code Status: Full code Family Communication: No family at bedside Disposition Plan: Home Consults called: None Admission status: Inpatient  Severity of Illness: The appropriate patient status for this patient is INPATIENT. Inpatient status is judged to be reasonable and necessary in order to provide the required intensity of service to ensure the patient's safety. The patient's presenting symptoms, physical exam findings, and initial radiographic and laboratory data in the context of their chronic comorbidities is felt to place them at high risk for further clinical deterioration. Furthermore, it is not anticipated that the patient will be medically stable for discharge from the hospital within 2 midnights of admission. The following factors support the patient status of inpatient.   " The patient's presenting symptoms include shortness of breath cough and fever. " The worrisome physical exam findings include sinus tachycardia with bilateral rhonchi. " The initial radiographic and laboratory data are worrisome because of evidence of pneumonia and sepsis. " The chronic co-morbidities include lupus.   * I certify that at the point of admission it is my clinical judgment that the patient will require inpatient hospital care spanning beyond 2 midnights  from the point of admission due to high intensity of service, high risk for further deterioration and high frequency of surveillance required.Barbette Merino MD [facility] Pager [phone]  If 7PM-7AM, please contact night-coverage [email or web address] Password [email or web address]  06/27/2020, 10:02 PM

## 2020-06-27 NOTE — ED Notes (Signed)
Patient hooked up to purewick.

## 2020-06-27 NOTE — ED Triage Notes (Signed)
Pt BIB EMS and presents with an asthma exacerbation.  Pt initially reported chest pain x two days but pt reports that it related to coughing.  Pt has had asthma issues due home renovation.  EMS gave pt a breathing treatment and improved pt's breathing.  Pt was found to be 86% RA on EMS arrival.  Pt currently on 3L [place] on arrival to ED.

## 2020-06-27 NOTE — ED Provider Notes (Signed)
[facility] DEPT Provider Note   CSN: [id number] Arrival date & time: 06/27/20  1800     History Chief Complaint  Patient presents with  . Shortness of Breath  . Fever    Dana Kennedy is a 54 y.o. female.  The history is provided by the patient.  Shortness of Breath Severity:  Moderate Onset quality:  Gradual Duration:  4 days Timing:  Constant Progression:  Worsening Chronicity:  New Context: URI (cough with sputum production, fever yesteday. 1 of 2 covid shots)   Relieved by:  Inhaler Worsened by:  Deep breathing and coughing Associated symptoms: chest pain, cough, fever and sputum production   Associated symptoms: no abdominal pain, no ear pain, no rash, no sore throat and no vomiting        Past Medical History:  Diagnosis Date  . Anemia   . Asthma   . Chest pain    atypcial chest pain evaluation 07/10/18 Philbert Riser, Coletta Memos, MD), echo order (NL LVEF, wall motion 09/2018)  . Chronic lower back pain   . Diverticulitis   . GERD (gastroesophageal reflux disease)   . History of blood transfusion    "HgB low w/lupus flareup" (08/19/2018)  . History of ITP 2014   NOT PROBLEM NOW  . Rheumatoid arthritis ([place])    "legs; all my bones" (08/19/2018)  . SLE (systemic lupus erythematosus) ([place])   . Umbilical hernia     Patient Active Problem List   Diagnosis Date Noted  . Tobacco use 01/01/2020  . Acute lower UTI 12/31/2019  . Asthma exacerbation 12/31/2019  . Bacterial vaginosis 12/31/2019  . CAP (community acquired pneumonia) 12/31/2019  . Diverticulitis large intestine 10/24/2018  . Acute diverticulitis 08/18/2018  . Vaginal spotting 08/18/2018  . GERD (gastroesophageal reflux disease)   . Asthma   . 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 Continuous tobacco abuse 05/02/2016  . 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 Status post tonsillectomy 05/02/2016  . Dehydration 10/12/2013  .  Idiopathic thrombocytopenic purpura ([place]) 09/05/2012  . Lupus (systemic lupus erythematosus) ([place]) 09/05/2012  . 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 HERNIA REPAIR    . PARTIAL COLECTOMY N/A 10/24/2018   Procedure: PARTIAL COLON RESECTION WITH ANASTOMOSIS; APPENDECTOMY  ERAS PATHWAY SPLENIC FLEXURE MOBILIZATION; UMBILICAL HERNIA REPAIR;  Surgeon: Kinsinger, Arta Bruce, MD;  Location: [facility];  Service: General;  Laterality: N/A;  . stomach ulcer surgery  2008  . TONSILLECTOMY    . TOTAL HIP ARTHROPLASTY  06/14/2012   Procedure: TOTAL HIP ARTHROPLASTY ANTERIOR APPROACH;  Surgeon: Mcarthur Rossetti, MD;  Location: [facility];  Service: Orthopedics;  Laterality: Left;  Left Total Hip Arthroplasty, Anterior Approach C-Arm  . TUBAL LIGATION    . UMBILICAL HERNIA REPAIR       OB History   No obstetric history on file.     Family History  Problem Relation Age of Onset  . Cancer Mother   . Hypertension Mother   . Heart attack Mother   . Liver disease Father   . Hypertension Brother     Social History   Tobacco Use  . Smoking status: Current Every Day Smoker    Packs/day: 0.50    Types: Cigarettes    Last attempt to quit: 09/01/2018    Years since quitting: 1.8  . Smokeless tobacco: Never Used  Vaping Use  . Vaping Use: Never used  Substance  Use Topics  . Alcohol use: Yes    Comment: 08/19/2018 "glass of wine a couple times/year"  . Drug use: No    Home Medications Prior to Admission medications   Medication Sig Start Date End Date Taking? Authorizing Provider  acetaminophen (TYLENOL) 325 MG tablet Take 2 tablets (650 mg total) by mouth every 6 (six) hours as needed for mild pain (or Fever >/= 101). 08/26/18  Yes Emokpae, Courage, MD  albuterol (VENTOLIN HFA) 108 (90 Base) MCG/ACT inhaler Inhale 2 puffs into the lungs every 4 (four) hours as needed for wheezing or shortness of breath. 12/30/19  Yes Albrizze, Kaitlyn E,  PA-C  baclofen (LIORESAL) 10 MG tablet Take 10 mg by mouth 3 (three) times daily as needed for muscle pain. 11/26/19  Yes [provider]  dicyclomine (BENTYL) 20 MG tablet Take 1 tablet (20 mg total) by mouth every 8 (eight) hours as needed for spasms. 02/18/19  Yes Davonna Belling, MD  fluticasone furoate-vilanterol (BREO ELLIPTA) 200-25 MCG/INH AEPB Inhale 1 puff into the lungs daily. 06/10/18  Yes Mannam, Praveen, MD  montelukast (SINGULAIR) 10 MG tablet Take 1 tablet (10 mg total) by mouth at bedtime. 01/02/20  Yes Mariel Aloe, MD  oxyCODONE-acetaminophen (PERCOCET) 10-325 MG tablet Take 1 tablet by mouth every 6 (six) hours as needed for pain. 07/20/19  Yes Carmin Muskrat, MD  albuterol (PROVENTIL) (2.5 MG/3ML) 0.083% nebulizer solution Take 3 mLs (2.5 mg total) by nebulization every 4 (four) hours for 2 days, THEN 3 mLs (2.5 mg total) every 6 (six) hours as needed for wheezing or shortness of breath. Patient not taking: Reported on 06/27/2020 01/02/20 02/03/20  Mariel Aloe, MD  fluticasone [facility]) 50 MCG/ACT nasal spray Place 2 sprays into both nostrils daily. Patient not taking: Reported on 06/27/2020 11/12/17   Marshell Garfinkel, MD  hydrochlorothiazide (MICROZIDE) 12.5 MG capsule Take 1 capsule (12.5 mg total) by mouth daily. 01/02/20 02/01/20  Mariel Aloe, MD  ibuprofen (ADVIL,MOTRIN) 800 MG tablet Take 1 tablet (800 mg total) by mouth every 8 (eight) hours as needed. Patient not taking: Reported on 06/27/2020 10/29/18   Kinsinger, Arta Bruce, MD  metroNIDAZOLE (FLAGYL) 500 MG tablet Take 1 tablet (500 mg total) by mouth 2 (two) times daily. Patient not taking: Reported on 06/27/2020 12/30/19   Albrizze, Verline Lema E, PA-C  nicotine (NICODERM CQ - DOSED IN MG/24 HOURS) 21 mg/24hr patch Place 1 patch (21 mg total) onto the skin daily. Patient not taking: Reported on 06/27/2020 01/03/20   Mariel Aloe, MD  pantoprazole (PROTONIX) 40 MG tablet Take 1 tablet (40 mg total) by mouth  daily. Patient not taking: Reported on 06/27/2020 08/26/18   Roxan Hockey, MD    Allergies    Aspirin, Eszopiclone, Gabapentin, Allegra [fexofenadine], and Restoril [temazepam]  Review of Systems   Review of Systems  Constitutional: Positive for fever. Negative for chills.  HENT: Negative for ear pain and sore throat.   Eyes: Negative for pain and visual disturbance.  Respiratory: Positive for cough, sputum production and shortness of breath.   Cardiovascular: Positive for chest pain. Negative for palpitations.  Gastrointestinal: Negative for abdominal pain and vomiting.  Genitourinary: Negative for dysuria and hematuria.  Musculoskeletal: Negative for arthralgias and back pain.  Skin: Negative for color change and rash.  Neurological: Negative for seizures and syncope.  All other systems reviewed and are negative.   Physical Exam Updated Vital Signs  ED Triage Vitals [06/27/20 1813]  Enc Vitals Group  BP (!) 144/91     Pulse Rate (!) 109     Resp 20     Temp (!) 100.7 F (38.2 C)     Temp Source Oral     SpO2 92 %     Weight      Height      Head Circumference      Peak Flow      Pain Score      Pain Loc      Pain Edu?      Excl. in [place]?     Physical Exam Vitals and nursing note reviewed.  Constitutional:      General: She is not in acute distress.    Appearance: She is well-developed. She is not ill-appearing.  HENT:     Head: Normocephalic and atraumatic.     Mouth/Throat:     Mouth: Mucous membranes are moist.  Eyes:     Conjunctiva/sclera: Conjunctivae normal.     Pupils: Pupils are equal, round, and reactive to light.  Cardiovascular:     Rate and Rhythm: Normal rate and regular rhythm.     Pulses: Normal pulses.     Heart sounds: Normal heart sounds. No murmur heard.   Pulmonary:     Effort: Tachypnea present. No respiratory distress.     Breath sounds: Decreased breath sounds and wheezing present.  Abdominal:     Palpations: Abdomen is  soft.     Tenderness: There is no abdominal tenderness.  Musculoskeletal:        General: Normal range of motion.     Cervical back: Normal range of motion and neck supple.     Right lower leg: No edema.     Left lower leg: No edema.  Skin:    General: Skin is warm and dry.     Capillary Refill: Capillary refill takes less than 2 seconds.  Neurological:     General: No focal deficit present.     Mental Status: She is alert.  Psychiatric:        Mood and Affect: Mood normal.     ED Results / Procedures / Treatments   Labs (all labs ordered are listed, but only abnormal results are displayed) Labs Reviewed  COMPREHENSIVE METABOLIC PANEL - Abnormal; Notable for the following components:      Result Value   Potassium 3.3 (*)    Glucose, Bld 113 (*)    BUN <5 (*)    All other components within normal limits  CBC WITH DIFFERENTIAL/PLATELET - Abnormal; Notable for the following components:   WBC 12.1 (*)    RBC 5.33 (*)    Neutro Abs 8.4 (*)    All other components within normal limits  APTT - Abnormal; Notable for the following components:   aPTT 37 (*)    All other components within normal limits  RESPIRATORY PANEL BY RT PCR (FLU A&B, COVID)  CULTURE, BLOOD (ROUTINE X 2)  CULTURE, BLOOD (ROUTINE X 2)  URINE CULTURE  LACTIC ACID, PLASMA  PROTIME-INR  URINALYSIS, ROUTINE W REFLEX MICROSCOPIC  I-STAT BETA HCG BLOOD, ED ([facility], [facility], [facility] ONLY)    EKG EKG Interpretation  Date/Time:  Sunday June 27 2020 20:11:29 EDT Ventricular Rate:  103 PR Interval:    QRS Duration: 82 QT Interval:  340 QTC Calculation: 445 R Axis:   36 Text Interpretation: Sinus tachycardia LAE, consider biatrial enlargement Low voltage, precordial leads Abnormal R-wave progression, early transition Confirmed by Lennice Sites [phone]) on 06/27/2020  8:19:33 PM   Radiology DG Chest Port 1 View  Result Date: 06/27/2020 CLINICAL DATA:  Chest pain and cough for 2 days.  Asthma. EXAM: PORTABLE CHEST 1  VIEW COMPARISON:  12/31/2019 FINDINGS: Heart size is within normal limits. Ectasia of the thoracic aorta is stable. Asymmetric airspace opacity is seen in the central right upper lobe, suspicious for pneumonia. No evidence of pleural effusion. IMPRESSION: Central right upper lobe airspace opacity, suspicious for pneumonia. Continued chest radiographic follow-up is recommended to confirm resolution. Electronically Signed   By: Marlaine Hind M.D.   On: 06/27/2020 19:20    Procedures Procedures (including critical care time)  Medications Ordered in ED Medications  cefTRIAXone (ROCEPHIN) 2 g in sodium chloride 0.9 % 100 mL IVPB ( Intravenous Stopped 06/27/20 1944)  azithromycin (ZITHROMAX) 500 mg in sodium chloride 0.9 % 250 mL IVPB (500 mg Intravenous New Bag/Given 06/27/20 2010)  sodium chloride 0.9 % bolus 1,000 mL (1,000 mLs Intravenous New Bag/Given 06/27/20 1914)  ipratropium (ATROVENT HFA) inhaler 2 puff (2 puffs Inhalation Given 06/27/20 2007)  albuterol (VENTOLIN HFA) 108 (90 Base) MCG/ACT inhaler 6 puff (6 puffs Inhalation Given 06/27/20 1916)  acetaminophen (TYLENOL) tablet 650 mg (650 mg Oral Given 06/27/20 1848)  methylPREDNISolone sodium succinate (SOLU-MEDROL) 125 mg/2 mL injection 125 mg (125 mg Intravenous Given 06/27/20 1917)    ED Course  I have reviewed the triage vital signs and the nursing notes.  Pertinent labs & imaging results that were available during my care of the patient were reviewed by me and considered in my medical decision making (see chart for details).    MDM Rules/Calculators/A&P                          Dana Kennedy is a 54 year old female with history of lupus, rheumatoid arthritis, asthma who presents to the ED with shortness of breath, fever.  Patient tachycardic and febrile and hypoxic to the mid 80s upon arrival.  Improved on 3 L of oxygen.  Patient also tachypneic.  Has had 1 of 2 Covid vaccine shots.  Has had cough, shortness of breath for  the last 4 days with sputum production.  Had fever about yesterday.  Sepsis work-up initiated.  Given sputum production concern for bacterial process.  Will start empiric IV antibiotics for CAP coverage.  Will test for Covid.  Will give further breathing treatment, Tylenol, steroids.  Anticipate admission for sepsis causing asthma exacerbation likely from viral process versus bacterial process.  Chest x-ray concerning for pneumonia.  Patient does have leukocytosis of 12.1.  Normal lactic acid.  No significant anemia, electrolyte abnormality.  Covid test is pending.  Overall suspect asthma exacerbation in the setting of pneumonia.  Possibly Covid.  Feeling better after breathing treatments, steroids, antibiotics.  Will admit for further care.  Patient stable on 2 L of oxygen.  This chart was dictated using voice recognition software.  Despite best efforts to proofread,  errors can occur which can change the documentation meaning.    Final Clinical Impression(s) / ED Diagnoses Final diagnoses:  Community acquired pneumonia, unspecified laterality  Mild intermittent asthma with exacerbation  Acute respiratory failure with hypoxia (HCC)  Sepsis, due to unspecified organism, unspecified whether acute organ dysfunction present [facility])    Rx / DC Orders ED Discharge Orders    None       Lennice Sites, DO 06/27/20 2058

## 2020-06-28 DIAGNOSIS — A419 Sepsis, unspecified organism: Secondary | ICD-10-CM | POA: Diagnosis not present

## 2020-06-28 LAB — CBC
HCT: 44.7 % (ref 36.0–46.0)
Hemoglobin: 14.1 g/dL (ref 12.0–15.0)
MCH: 26.8 pg (ref 26.0–34.0)
MCHC: 31.5 g/dL (ref 30.0–36.0)
MCV: 84.8 fL (ref 80.0–100.0)
Platelets: 358 10*3/uL (ref 150–400)
RBC: 5.27 MIL/uL — ABNORMAL HIGH (ref 3.87–5.11)
RDW: 15.1 % (ref 11.5–15.5)
WBC: 11.9 10*3/uL — ABNORMAL HIGH (ref 4.0–10.5)
nRBC: 0 % (ref 0.0–0.2)

## 2020-06-28 LAB — CORTISOL-AM, BLOOD: Cortisol - AM: 4 ug/dL — ABNORMAL LOW (ref 6.7–22.6)

## 2020-06-28 LAB — PROTIME-INR
INR: 1.1 (ref 0.8–1.2)
Prothrombin Time: 13.9 seconds (ref 11.4–15.2)

## 2020-06-28 LAB — PROCALCITONIN: Procalcitonin: <0.1 ng/mL

## 2020-06-28 MED ORDER — PREDNISONE 20 MG PO TABS
40.0000 mg | ORAL_TABLET | Freq: Every day | ORAL | Status: DC
  Administered 2020-06-28: 40 mg via ORAL
  Filled 2020-06-28: qty 2

## 2020-06-28 MED ORDER — BREO ELLIPTA 200-25 MCG/INH IN AEPB: 1.0000 | INHALATION_SPRAY | Freq: Every day | RESPIRATORY_TRACT | 1 refills | Status: AC

## 2020-06-28 MED ORDER — PREDNISONE 20 MG PO TABS: 40.0000 mg | ORAL_TABLET | Freq: Every day | ORAL | 0 refills | Status: AC

## 2020-06-28 MED ORDER — ALBUTEROL SULFATE HFA 108 (90 BASE) MCG/ACT IN AERS: 2.0000 | INHALATION_SPRAY | RESPIRATORY_TRACT | 0 refills | Status: DC | PRN

## 2020-06-28 MED ORDER — ALBUTEROL SULFATE (2.5 MG/3ML) 0.083% IN NEBU: 2.5000 mg | INHALATION_SOLUTION | RESPIRATORY_TRACT | 0 refills | Status: DC | PRN

## 2020-06-28 MED ORDER — NICOTINE 21 MG/24HR TD PT24: 21.0000 mg | MEDICATED_PATCH | Freq: Every day | TRANSDERMAL | 0 refills | Status: AC

## 2020-06-28 MED ORDER — FLUTICASONE PROPIONATE 50 MCG/ACT NA SUSP: 2.0000 | Freq: Every day | NASAL | 0 refills | Status: DC

## 2020-06-28 MED ORDER — POTASSIUM CHLORIDE CRYS ER 20 MEQ PO TBCR
20.0000 meq | EXTENDED_RELEASE_TABLET | Freq: Once | ORAL | Status: AC
  Administered 2020-06-28: 20 meq via ORAL
  Filled 2020-06-28: qty 1

## 2020-06-28 MED ORDER — HYDROCHLOROTHIAZIDE 12.5 MG PO CAPS: 12.5000 mg | ORAL_CAPSULE | Freq: Every day | ORAL | 0 refills | Status: DC

## 2020-06-28 MED ORDER — AEROCHAMBER Z-STAT PLUS/MEDIUM MISC: 2 refills | Status: DC

## 2020-06-28 MED ORDER — AEROCHAMBER Z-STAT PLUS/MEDIUM MISC
1.0000 | Freq: Once | Status: DC
  Filled 2020-06-28: qty 1

## 2020-06-28 MED ORDER — AZITHROMYCIN 500 MG PO TABS: 500.0000 mg | ORAL_TABLET | Freq: Every day | ORAL | 0 refills | Status: AC

## 2020-06-28 MED ORDER — AMOXICILLIN 500 MG PO CAPS: 1000.0000 mg | ORAL_CAPSULE | Freq: Three times a day (TID) | ORAL | 0 refills | Status: AC

## 2020-06-28 NOTE — Discharge Summary (Addendum)
Physician Discharge Summary  Dana Kennedy HAL:[id number] DOB: 1966-04-10 DOA: 06/27/2020  PCP: [facility], [place] date: 06/27/2020 Discharge date: 06/28/2020  Time spent: 40 minutes  Recommendations for Outpatient Follow-up:  1. Follow outpatient CBC/CMP 2. Follow asthma control outpatient 3. Follow smoking cessation outpatient - continue to encourage 4. Follow repeat CXR in 3-4 weeks 5. Follow cultures outpatient   Discharge Diagnoses:  Principal Problem:   Sepsis ([place]) Active Problems:   Lupus (systemic lupus erythematosus) (HCC)   GERD (gastroesophageal reflux disease)   Asthma exacerbation   CAP (community acquired pneumonia)   Tobacco use   Hypokalemia   Discharge Condition: stable  Diet recommendation: heart healthy  Filed Weights   06/27/20 1830  Weight: 100.2 kg   History of present illness:  Dana Kennedy is Dana Kennedy 54 y.o. female with medical history significant of asthma, tobacco abuse, previous pneumonias, GERD, rheumatoid arthritis, lupus, diverticular disease and Dana Kennedy who presented to the ER with 2 to 3 days of shortness of breath cough and malaise.  Patient has received the first dose of her Covid vaccine.  She had no confirmed exposure.  Was having fever and chills in association with her symptoms.  Denied any nausea vomiting or diarrhea.  Patient was seen in the ER evaluated and found to have infiltrates consistent with pneumonia.  COVID-19 screen is negative.  She is wheezing and appears to meet sepsis criteria by temperature, pulse and respiratory rate.  Also by her white count.  Patient is therefore being admitted to the hospital with sepsis.  Lactic acid is only 1..  ED Course: Temperature 100.7 blood pressure 150/93 pulse 110 respirate 28 oxygen sats 90% on room air.  White count 12.1 hemoglobin 14.5 and platelets counts of 353 sodium 138 potassium 3.3 chloride 100 CO2 27 BUN less than 5 and creatinine  0.6 his glucose 113.  Lactic acid 1.0.  COVID-19 screen is negative.  Chest x-ray shows central right upper lobe airspace opacity suspicious for pneumonia.  Patient will be admitted for sepsis due to community-acquired pneumonia.  She was seen for asthma exacerbation in the setting of pneumonia.  She's improved with steroids and antibiotics.  She's discharged on 10/11 in stable condition with recommendations to follow up with PCP outpatient.   See below for additional details  Hospital Course:  #1 Sepsis 2/2 CAP  Asthma Exacerbation  Acute Hypoxic Resp Failure 2/2 Pneumonia and Asthma Exacerbatio: Ruled in for sepsis based on fever, tachycardia, tachypnea at presentation.  CXR with central right upper lobe airspace opacity.   Improved on hospital day 1 after steroids and antibiotics D/c with amoxicillin/azithromycin  Will ask resp to do teaching with spacer before she goes.  Will also attempt to get nebulizer. Needs to quit smoking, discussed.  Refills of albuterol, breo. Follow outpatient Follow cultures outpatient  Able to maintain o2 sats with ambulation on RA, RR increased with ambulation, but returned to baseline at rest per discussion with RN  #2 community-acquired pneumonia: abx as above Follow cx outpatient   #3 asthma with COPD: as above, continue albuterol/breo.  Steroids.  Encourage smoking cessatino  #4 tobacco abuse: Initiate nicotine patch with counseling.  #5 hypokalemia: Replete potassium.  #6 GERD: Continue with PPIs.  #7 history of lupus: Stable.  Continue home regimen  Procedures:  none  Consultations:  none  Discharge Exam: Vitals:   06/28/20 0900 06/28/20 0902  BP: (!) 165/104   Pulse: 95 95  Resp: (!) 26 20  Temp:    SpO2:  94%   Feels ok, eager to go home Breathing feels improved Interested in quitting smoking Asking for nebulizer and refills of asthma meds  General: No acute distress. Cardiovascular: Heart sounds show Dana Kennedy regular rate,  and rhythm.  Lungs: mild scattered wheezing  Abdomen: Soft, nontender, nondistended Neurological: Alert and oriented 3. Moves all extremities 4 . Cranial nerves II through XII grossly intact. Skin: Warm and dry. No rashes or lesions. Extremities: No clubbing or cyanosis. No edema.   Discharge Instructions   Discharge Instructions    Call MD for:  difficulty breathing, headache or visual disturbances   Complete by: As directed    Call MD for:  extreme fatigue   Complete by: As directed    Call MD for:  hives   Complete by: As directed    Call MD for:  persistant dizziness or light-headedness   Complete by: As directed    Call MD for:  persistant nausea and vomiting   Complete by: As directed    Call MD for:  redness, tenderness, or signs of infection (pain, swelling, redness, odor or green/yellow discharge around incision site)   Complete by: As directed    Call MD for:  severe uncontrolled pain   Complete by: As directed    Call MD for:  temperature >100.4   Complete by: As directed    Diet - low sodium heart healthy   Complete by: As directed    Discharge instructions   Complete by: As directed    You were seen for pneumonia and an asthma exacerbation.  We'll send you home with antibiotics and steroids.  You should continue your inhaler and controller medicines for your asthma/COPD.  Continue the albuterol every 4-6 hrs as needed either through the inhaler with Dana Kennedy spacer or the nebulized treatments.  Continue the breo ellipta daily, this is the medicine that will help control symptoms long term.    Return for new, recurrent, or worsening symptoms.  Please ask your PCP to request records from this hospitalization so they know what was done and what the next steps will be.   For home use only DME Nebulizer machine   Complete by: As directed    Patient needs Dana Kennedy nebulizer to treat with the following condition: Asthma   Length of Need: Lifetime   Increase activity slowly    Complete by: As directed      Allergies as of 06/28/2020      Reactions   Aspirin    Causes bleeding   Eszopiclone Swelling   Felt like throat was closing. Not sure if it was related to this medication or not. lunesta   Gabapentin Swelling   Ankles swelling   Allegra [fexofenadine] Cough   Pt states she began having spells of dry cough after taking this medication   Restoril [temazepam] Other (See Comments)   Headaches       Medication List    STOP taking these medications   metroNIDAZOLE 500 MG tablet Commonly known as: FLAGYL     TAKE these medications   acetaminophen 325 MG tablet Commonly known as: TYLENOL Take 2 tablets (650 mg total) by mouth every 6 (six) hours as needed for mild pain (or Fever >/= 101).   aerochamber Z-Stat Plus/medium inhaler Use as instructed   albuterol 108 (90 Base) MCG/ACT inhaler Commonly known as: VENTOLIN HFA Inhale 2 puffs into the lungs every 4 (four) hours as needed for wheezing or shortness of  breath. What changed: Another medication with the same name was changed. Make sure you understand how and when to take each.   albuterol (2.5 MG/3ML) 0.083% nebulizer solution Commonly known as: PROVENTIL Take 3 mLs (2.5 mg total) by nebulization every 4 (four) hours as needed for wheezing or shortness of breath. What changed: See the new instructions.   amoxicillin 500 MG capsule Commonly known as: AMOXIL Take 2 capsules (1,000 mg total) by mouth 3 (three) times daily for 5 days.   azithromycin 500 MG tablet Commonly known as: Zithromax Take 1 tablet (500 mg total) by mouth daily for 3 days. Take 1 tablet daily for 3 days. Start taking on: June 29, 2020   baclofen 10 MG tablet Commonly known as: LIORESAL Take 10 mg by mouth 3 (three) times daily as needed for muscle pain.   Breo Ellipta 200-25 MCG/INH Aepb Generic drug: fluticasone furoate-vilanterol Inhale 1 puff into the lungs daily.   dicyclomine 20 MG tablet Commonly  known as: BENTYL Take 1 tablet (20 mg total) by mouth every 8 (eight) hours as needed for spasms.   fluticasone 50 MCG/ACT nasal spray Commonly known as: FLONASE Place 2 sprays into both nostrils daily.   hydrochlorothiazide 12.5 MG capsule Commonly known as: Microzide Take 1 capsule (12.5 mg total) by mouth daily.   ibuprofen 800 MG tablet Commonly known as: ADVIL Take 1 tablet (800 mg total) by mouth every 8 (eight) hours as needed.   montelukast 10 MG tablet Commonly known as: SINGULAIR Take 1 tablet (10 mg total) by mouth at bedtime.   nicotine 21 mg/24hr patch Commonly known as: NICODERM CQ - dosed in mg/24 hours Place 1 patch (21 mg total) onto the skin daily.   oxyCODONE-acetaminophen 10-325 MG tablet Commonly known as: Percocet Take 1 tablet by mouth every 6 (six) hours as needed for pain.   pantoprazole 40 MG tablet Commonly known as: PROTONIX Take 1 tablet (40 mg total) by mouth daily.   predniSONE 20 MG tablet Commonly known as: DELTASONE Take 2 tablets (40 mg total) by mouth daily with breakfast for 5 days.            Durable Medical Equipment  (From admission, onward)         Start     Ordered   06/28/20 0000  For home use only DME Nebulizer machine       Question Answer Comment  Patient needs Neeta Storey nebulizer to treat with the following condition Asthma   Length of Need Lifetime      06/28/20 1057         Allergies  Allergen Reactions  . Aspirin     Causes bleeding  . Eszopiclone Swelling    Felt like throat was closing. Not sure if it was related to this medication or not. lunesta  . Gabapentin Swelling    Ankles swelling  . Allegra [Fexofenadine] Cough    Pt states she began having spells of dry cough after taking this medication  . Restoril [Temazepam] Other (See Comments)    Headaches       The results of significant diagnostics from this hospitalization (including imaging, microbiology, ancillary and laboratory) are listed below  for reference.    Significant Diagnostic Studies: DG Chest Port 1 View  Result Date: 06/27/2020 CLINICAL DATA:  Chest pain and cough for 2 days.  Asthma. EXAM: PORTABLE CHEST 1 VIEW COMPARISON:  12/31/2019 FINDINGS: Heart size is within normal limits. Ectasia of the thoracic aorta is stable.  Asymmetric airspace opacity is seen in the central right upper lobe, suspicious for pneumonia. No evidence of pleural effusion. IMPRESSION: Central right upper lobe airspace opacity, suspicious for pneumonia. Continued chest radiographic follow-up is recommended to confirm resolution. Electronically Signed   By: Marlaine Hind M.D.   On: 06/27/2020 19:20    Microbiology: Recent Results (from the past 240 hour(s))  Respiratory Panel by RT PCR (Flu Kynnadi Dicenso&B, Covid) - Nasopharyngeal Swab     Status: None   Collection Time: 06/27/20  7:01 PM   Specimen: Nasopharyngeal Swab  Result Value Ref Range Status   SARS Coronavirus 2 by RT PCR NEGATIVE NEGATIVE Final    Comment: (NOTE) SARS-CoV-2 target nucleic acids are NOT DETECTED.  The SARS-CoV-2 RNA is generally detectable in upper respiratoy specimens during the acute phase of infection. The lowest concentration of SARS-CoV-2 viral copies this assay can detect is 131 copies/mL. Onie Kasparek negative result does not preclude SARS-Cov-2 infection and should not be used as the sole basis for treatment or other patient management decisions. Theoren Palka negative result may occur with  improper specimen collection/handling, submission of specimen other than nasopharyngeal swab, presence of viral mutation(s) within the areas targeted by this assay, and inadequate number of viral copies (<131 copies/mL). Devlyn Retter negative result must be combined with clinical observations, patient history, and epidemiological information. The expected result is Negative.  Fact Sheet for Patients:  PinkCheek.be  Fact Sheet for Healthcare Providers:   GravelBags.it  This test is no t yet approved or cleared by the [place] FDA and  has been authorized for detection and/or diagnosis of SARS-CoV-2 by FDA under an Emergency Use Authorization (EUA). This EUA will remain  in effect (meaning this test can be used) for the duration of the COVID-19 declaration under Section 564(b)(1) of the Act, 21 U.S.C. section 360bbb-3(b)(1), unless the authorization is terminated or revoked sooner.     Influenza Beatris Belen by PCR NEGATIVE NEGATIVE Final   Influenza B by PCR NEGATIVE NEGATIVE Final    Comment: (NOTE) The Xpert Xpress SARS-CoV-2/FLU/RSV assay is intended as an aid in  the diagnosis of influenza from Nasopharyngeal swab specimens and  should not be used as Brett Soza sole basis for treatment. Nasal washings and  aspirates are unacceptable for Xpert Xpress SARS-CoV-2/FLU/RSV  testing.  Fact Sheet for Patients: PinkCheek.be  Fact Sheet for Healthcare Providers: GravelBags.it  This test is not yet approved or cleared by the [place] FDA and  has been authorized for detection and/or diagnosis of SARS-CoV-2 by  FDA under an Emergency Use Authorization (EUA). This EUA will remain  in effect (meaning this test can be used) for the duration of the  Covid-19 declaration under Section 564(b)(1) of the Act, 21  U.S.C. section 360bbb-3(b)(1), unless the authorization is  terminated or revoked. Performed at [facility], [place] [address], [place], [place] [address]   Blood Culture (routine x 2)     Status: None (Preliminary result)   Collection Time: 06/27/20  7:09 PM   Specimen: Left Antecubital; Blood  Result Value Ref Range Status   Specimen Description   Final    LEFT ANTECUBITAL BLOOD Performed at [facility], [place] [address], [place], [place] [address]    Special Requests   Final    BOTTLES DRAWN AEROBIC AND ANAEROBIC Blood Culture  results may not be optimal due to an inadequate volume of blood received in culture bottles Performed at [facility] [address] [place], [place] [address]  Culture PENDING  Incomplete   Report Status PENDING  Incomplete     Labs: Basic Metabolic Panel: Recent Labs  Lab 06/27/20 1901  NA 138  K 3.3*  CL 100  CO2 27  GLUCOSE 113*  BUN <5*  CREATININE 0.66  CALCIUM 9.3   Liver Function Tests: Recent Labs  Lab 06/27/20 1901  AST 17  ALT 13  ALKPHOS 74  BILITOT 0.5  PROT 7.5  ALBUMIN 4.3   No results for input(s): LIPASE, AMYLASE in the last 168 hours. No results for input(s): AMMONIA in the last 168 hours. CBC: Recent Labs  Lab 06/27/20 1901 06/28/20 0445  WBC 12.1* 11.9*  NEUTROABS 8.4*  --   HGB 14.5 14.1  HCT 44.7 44.7  MCV 83.9 84.8  PLT 353 358   Cardiac Enzymes: No results for input(s): CKTOTAL, CKMB, CKMBINDEX, TROPONINI in the last 168 hours. BNP: BNP (last 3 results) No results for input(s): BNP in the last 8760 hours.  ProBNP (last 3 results) No results for input(s): PROBNP in the last 8760 hours.  CBG: No results for input(s): GLUCAP in the last 168 hours.     Signed:  Fayrene Helper MD.  [facility] 06/28/2020, 11:13 AM

## 2020-06-28 NOTE — TOC Initial Note (Signed)
Transition of Care [facility]) - Initial/Assessment Note    Patient Details  Name: Dana Kennedy MRN: [id number] Date of Birth: [birth year]  [facility] CM/SW Contact:    Erenest Rasher, RN Phone Number:  [phone] 06/28/2020, 1:25 PM  Clinical Narrative:                 TOC CM spoke to pt and husband at home to assist with care. [place] for nebulizer machine to me delivered to room prior to dc.   Expected Discharge Plan: Home/Self Care Barriers to Discharge: Continued Medical Work up   Patient Goals and CMS Choice        Expected Discharge Plan and Services Expected Discharge Plan: Home/Self Care   Discharge Planning Services: CM Consult     Expected Discharge Date: 06/28/20               DME Arranged: Nebulizer machine DME Agency: [employer] Date DME Agency Contacted: 06/28/20 Time DME Agency Contacted: 8 Representative spoke with at DME Agency: Andree Coss            Prior Living Arrangements/Services   Lives with:: Spouse Patient language and need for interpreter reviewed:: Yes Do you feel safe going back to the place where you live?: Yes      Need for Family Participation in Patient Care: No (Comment) Care giver support system in place?: No (comment)   Criminal Activity/Legal Involvement Pertinent to Current Situation/Hospitalization: No - Comment as needed  Activities of Daily Living      Permission Sought/Granted Permission sought to share information with : Case Manager, PCP, Family Supports Permission granted to share information with : Yes, Verbal Permission Granted  Share Information with NAME: Lamar Laundry  Permission granted to share info w AGENCY: DME agency  Permission granted to share info w Relationship: husband  Permission granted to share info w Contact Information: [phone]  Emotional Assessment       Orientation: : Oriented to Self, Oriented to Place, Oriented to  Time,  Oriented to Situation   Psych Involvement: No (comment)  Admission diagnosis:  Sepsis ([place]) [A41.9] CAP (community acquired pneumonia) [J18.9] Patient Active Problem List   Diagnosis Date Noted  . Sepsis ([place]) 06/27/2020  . Hypokalemia 06/27/2020  . Tobacco use 01/01/2020  . Acute lower UTI 12/31/2019  . Asthma exacerbation 12/31/2019  . Bacterial vaginosis 12/31/2019  . CAP (community acquired pneumonia) 12/31/2019  . Diverticulitis large intestine 10/24/2018  . Acute diverticulitis 08/18/2018  . Vaginal spotting 08/18/2018  . GERD (gastroesophageal reflux disease)   . Asthma   . 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 Continuous tobacco abuse 05/02/2016  . 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 Status post tonsillectomy 05/02/2016  . Dehydration 10/12/2013  . Idiopathic thrombocytopenic purpura ([place]) 09/05/2012  . Lupus (systemic lupus erythematosus) ([place]) 09/05/2012  . 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 [place] [address] Phone: [phone] Fax: [phone]  [id number] - [place], [place] [address] [place] [address] Phone: [phone] Fax: [phone]     Social Determinants of Health (SDOH) Interventions    Readmission Risk Interventions No flowsheet data found.

## 2020-06-28 NOTE — ED Notes (Signed)
Ambulated pt 50 meters. Pt maintained O2 91-93%, HR 100-107, and RR 40-47 while ambulating.

## 2020-06-29 LAB — URINE CULTURE: Culture: <10000 — AB

## 2020-07-03 LAB — CULTURE, BLOOD (ROUTINE X 2)
Culture: NO GROWTH
Culture: NO GROWTH
Special Requests: ADEQUATE

## 2021-06-24 ENCOUNTER — Other Ambulatory Visit: Payer: Self-pay | Admitting: Physician Assistant

## 2021-06-24 DIAGNOSIS — J8410 Pulmonary fibrosis, unspecified: Secondary | ICD-10-CM

## 2022-03-16 ENCOUNTER — Emergency Department (HOSPITAL_COMMUNITY): Payer: Medicaid Other

## 2022-03-16 ENCOUNTER — Emergency Department (HOSPITAL_COMMUNITY)
Admission: EM | Admit: 2022-03-16 | Discharge: 2022-03-16 | Discharge status: Against Medical Advice | Payer: Medicaid Other | Attending: Emergency Medicine | Admitting: Emergency Medicine

## 2022-03-16 ENCOUNTER — Other Ambulatory Visit: Payer: Self-pay

## 2022-03-16 ENCOUNTER — Encounter (HOSPITAL_COMMUNITY): Payer: Self-pay | Admitting: Emergency Medicine

## 2022-03-16 DIAGNOSIS — R140 Abdominal distension (gaseous): Secondary | ICD-10-CM | POA: Diagnosis present

## 2022-03-16 DIAGNOSIS — R1031 Right lower quadrant pain: Secondary | ICD-10-CM | POA: Diagnosis not present

## 2022-03-16 DIAGNOSIS — R0602 Shortness of breath: Secondary | ICD-10-CM | POA: Insufficient documentation

## 2022-03-16 DIAGNOSIS — Z5321 Procedure and treatment not carried out due to patient leaving prior to being seen by health care provider: Secondary | ICD-10-CM | POA: Insufficient documentation

## 2022-03-16 LAB — BASIC METABOLIC PANEL
Anion gap: 11 (ref 5–15)
BUN: 13 mg/dL (ref 6–20)
CO2: 23 mmol/L (ref 22–32)
Calcium: 9.3 mg/dL (ref 8.9–10.3)
Chloride: 105 mmol/L (ref 98–111)
Creatinine, Ser: 0.65 mg/dL (ref 0.44–1.00)
GFR, Estimated: >60 mL/min (ref >60)
Glucose, Bld: 98 mg/dL (ref 70–99)
Potassium: 4.1 mmol/L (ref 3.5–5.1)
Sodium: 139 mmol/L (ref 135–145)

## 2022-03-16 LAB — CBC WITH DIFFERENTIAL/PLATELET
Abs Immature Granulocytes: 0.04 10*3/uL (ref 0.00–0.07)
Basophils Absolute: 0 10*3/uL (ref 0.0–0.1)
Basophils Relative: 0 %
Eosinophils Absolute: 0.2 10*3/uL (ref 0.0–0.5)
Eosinophils Relative: 3 %
HCT: 41.7 % (ref 36.0–46.0)
Hemoglobin: 13.5 g/dL (ref 12.0–15.0)
Immature Granulocytes: 1 %
Lymphocytes Relative: 30 %
Lymphs Abs: 2.6 10*3/uL (ref 0.7–4.0)
MCH: 27.6 pg (ref 26.0–34.0)
MCHC: 32.4 g/dL (ref 30.0–36.0)
MCV: 85.3 fL (ref 80.0–100.0)
Monocytes Absolute: 0.8 10*3/uL (ref 0.1–1.0)
Monocytes Relative: 10 %
Neutro Abs: 4.8 10*3/uL (ref 1.7–7.7)
Neutrophils Relative %: 56 %
Platelets: 415 10*3/uL — ABNORMAL HIGH (ref 150–400)
RBC: 4.89 MIL/uL (ref 3.87–5.11)
RDW: 13.5 % (ref 11.5–15.5)
WBC: 8.5 10*3/uL (ref 4.0–10.5)
nRBC: 0 % (ref 0.0–0.2)

## 2022-03-16 LAB — BRAIN NATRIURETIC PEPTIDE: B Natriuretic Peptide: 14 pg/mL (ref 0.0–100.0)

## 2022-03-16 NOTE — ED Triage Notes (Signed)
Patient complains of a painful hernia on the RLQ of her abdomen that got bigger four days ago and has become more painful. Patient is alert, oriented, and in no apparent distress at this time.

## 2022-03-16 NOTE — ED Notes (Signed)
The patient has not answered x4

## 2022-03-16 NOTE — ED Notes (Signed)
Pt did not answer for room. 

## 2022-03-16 NOTE — ED Provider Triage Note (Signed)
Emergency Medicine Provider Triage Evaluation Note  Dana Kennedy , a 56 y.o. female  was evaluated in triage.  Pt complains of abdominal swelling and distention with shortness of breath.  Patient states that she had a hernia repair surgery in 2020.  Patient states that since that time she has had intermittent abdominal pain and swelling.  Patient states that today her swelling is much worse than usual.  She states over the past 4 days she has had distention with swelling.  Patient states that this morning, beginning at 4 AM she has felt short of breath.  Denies cough, chest pain  Review of Systems  Positive: Abdominal pain, shortness of breath Negative: Chest pain  Physical Exam  BP (!) 142/94 (BP Location: Right Arm)   Pulse 95   Temp 99 F (37.2 C) (Oral)   Resp 18   LMP 02/03/2018   SpO2 97%  Gen:   Awake, no distress   Resp:  Normal effort  MSK:   Moves extremities without difficulty  Other:  Obvious distention, worse on right side of abdomen, right lower quadrant tenderness to palpation  Medical Decision Making  Medically screening exam initiated at 2:19 PM.  Appropriate orders placed.  Elvis A Fields-Baugham was informed that the remainder of the evaluation will be completed by another provider, this initial triage assessment does not replace that evaluation, and the importance of remaining in the ED until their evaluation is complete.     Dorothyann Peng, PA-C 03/16/22 1420

## 2022-06-13 ENCOUNTER — Encounter (HOSPITAL_COMMUNITY): Payer: Self-pay | Admitting: Emergency Medicine

## 2022-06-13 ENCOUNTER — Ambulatory Visit (HOSPITAL_COMMUNITY)
Admission: EM | Admit: 2022-06-13 | Discharge: 2022-06-13 | Disposition: A | Discharge status: Home / Self Care | Payer: Medicaid Other | Attending: Family Medicine | Admitting: Family Medicine

## 2022-06-13 DIAGNOSIS — B002 Herpesviral gingivostomatitis and pharyngotonsillitis: Secondary | ICD-10-CM | POA: Diagnosis not present

## 2022-06-13 DIAGNOSIS — Z20822 Contact with and (suspected) exposure to covid-19: Secondary | ICD-10-CM | POA: Diagnosis not present

## 2022-06-13 DIAGNOSIS — J4520 Mild intermittent asthma, uncomplicated: Secondary | ICD-10-CM

## 2022-06-13 DIAGNOSIS — R197 Diarrhea, unspecified: Secondary | ICD-10-CM

## 2022-06-13 DIAGNOSIS — M329 Systemic lupus erythematosus, unspecified: Secondary | ICD-10-CM | POA: Diagnosis not present

## 2022-06-13 DIAGNOSIS — J069 Acute upper respiratory infection, unspecified: Secondary | ICD-10-CM | POA: Diagnosis present

## 2022-06-13 LAB — RESP PANEL BY RT-PCR (RSV, FLU A&B, COVID)  RVPGX2
Influenza A by PCR: NEGATIVE
Influenza B by PCR: NEGATIVE
Resp Syncytial Virus by PCR: NEGATIVE
SARS Coronavirus 2 by RT PCR: NEGATIVE

## 2022-06-13 MED ORDER — ALBUTEROL SULFATE HFA 108 (90 BASE) MCG/ACT IN AERS: 2.0000 | INHALATION_SPRAY | RESPIRATORY_TRACT | 0 refills | Status: DC | PRN

## 2022-06-13 MED ORDER — VALACYCLOVIR HCL 1 G PO TABS: 2000.0000 mg | ORAL_TABLET | Freq: Two times a day (BID) | ORAL | 0 refills | Status: AC

## 2022-06-13 MED ORDER — KETOROLAC TROMETHAMINE 30 MG/ML IJ SOLN
INTRAMUSCULAR | Status: AC
  Filled 2022-06-13: qty 1

## 2022-06-13 MED ORDER — KETOROLAC TROMETHAMINE 30 MG/ML IJ SOLN
30.0000 mg | Freq: Once | INTRAMUSCULAR | Status: AC
  Administered 2022-06-13: 30 mg via INTRAMUSCULAR

## 2022-06-13 MED ORDER — KETOROLAC TROMETHAMINE 10 MG PO TABS: 10.0000 mg | ORAL_TABLET | Freq: Four times a day (QID) | ORAL | 0 refills | Status: DC | PRN

## 2022-06-13 NOTE — ED Provider Notes (Signed)
[place]    CSN: [id number] Arrival date & time: 06/13/22  0809      History   Chief Complaint Chief Complaint  Patient presents with   Mouth Lesions    HPI Dana Kennedy is a 56 y.o. female.    Mouth Lesions  Here for a 1 day history of sores around her right corner of her mouth and under her lip, and in her mouth.  They are causing some oral pain.  For 3 days she has had diarrhea that is been very frequent up to 10 times a day.  No blood in stool  For 2 days that she has had cough and congestion.  No fever noted.  She did do a home test for COVID and it was negative  She reports a history of lupus, and I can see a high titer ANA result in 2011 in epic.  She does not see a rheumatologist.  She also has not seen her PCP in a long time.  She does have a history of asthma.  No wheezing lately She noted a thick vaginal discharge this morning and requests STD testing   Past Medical History:  Diagnosis Date   Anemia    Asthma    Chest pain    atypcial chest pain evaluation 07/10/18 Philbert Riser, Coletta Memos, MD), echo order (NL LVEF, wall motion 09/2018)   Chronic lower back pain    Diverticulitis    GERD (gastroesophageal reflux disease)    History of blood transfusion    "HgB low w/lupus flareup" (08/19/2018)   History of ITP 2014   NOT PROBLEM NOW   Rheumatoid arthritis ([place])    "legs; all my bones" (08/19/2018)   SLE (systemic lupus erythematosus) ([place])    Umbilical hernia     Patient Active Problem List   Diagnosis Date Noted   Sepsis ([place]) 06/27/2020   Hypokalemia 06/27/2020   Tobacco use 01/01/2020   Acute lower UTI 12/31/2019   Asthma exacerbation 12/31/2019   Bacterial vaginosis 12/31/2019   CAP (community acquired pneumonia) 12/31/2019   Diverticulitis large intestine 10/24/2018   Acute diverticulitis 08/18/2018   Vaginal spotting 08/18/2018   GERD (gastroesophageal reflux disease)    Asthma    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   Continuous tobacco abuse 05/02/2016   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   Status post tonsillectomy 05/02/2016   Dehydration 10/12/2013   Idiopathic thrombocytopenic purpura ([place]) 09/05/2012   Lupus (systemic lupus erythematosus) ([place]) 09/05/2012   Avascular necrosis of hip ([place]) 06/14/2012    Past Surgical History:  Procedure Laterality Date   HERNIA REPAIR     PARTIAL COLECTOMY N/A 10/24/2018   Procedure: PARTIAL COLON RESECTION WITH ANASTOMOSIS; APPENDECTOMY  ERAS PATHWAY SPLENIC FLEXURE MOBILIZATION; UMBILICAL HERNIA REPAIR;  Surgeon: Kieth Brightly, Arta Bruce, MD;  Location: [facility];  Service: General;  Laterality: N/A;   stomach ulcer surgery  2008   TONSILLECTOMY     TOTAL HIP ARTHROPLASTY  06/14/2012   Procedure: TOTAL HIP ARTHROPLASTY ANTERIOR APPROACH;  Surgeon: Mcarthur Rossetti, MD;  Location: [facility];  Service: Orthopedics;  Laterality: Left;  Left Total Hip Arthroplasty, Anterior Approach C-Arm   TUBAL LIGATION     UMBILICAL HERNIA REPAIR      OB History   No obstetric history on file.      Home Medications    Prior to Admission medications   Medication Sig Start Date End Date Taking? Authorizing Provider  ketorolac (TORADOL) 10 MG  tablet Take 1 tablet (10 mg total) by mouth every 6 (six) hours as needed (pain). 06/13/22  Yes Livianna Petraglia, Gwenlyn Perking, MD  valACYclovir (VALTREX) 1000 MG tablet Take 2 tablets (2,000 mg total) by mouth 2 (two) times daily for 2 doses. 06/13/22 06/14/22 Yes Taiesha Bovard, Gwenlyn Perking, MD  acetaminophen (TYLENOL) 325 MG tablet Take 2 tablets (650 mg total) by mouth every 6 (six) hours as needed for mild pain (or Fever >/= 101). 08/26/18   Roxan Hockey, MD  albuterol (VENTOLIN HFA) 108 (90 Base) MCG/ACT inhaler Inhale 2 puffs into the lungs every 4 (four) hours as needed for wheezing or shortness of breath. 06/13/22   Barrett Henle, MD  fluticasone (FLONASE) 50 MCG/ACT nasal spray Place 2 sprays into both nostrils  daily. 06/28/20 07/28/20  Elodia Florence., MD  hydrochlorothiazide (MICROZIDE) 12.5 MG capsule Take 1 capsule (12.5 mg total) by mouth daily. 06/28/20 07/28/20  Elodia Florence., MD  Spacer/Aero-Holding Chambers (AEROCHAMBER Z-STAT PLUS/MEDIUM) inhaler Use as instructed 06/28/20   Elodia Florence., MD    Family History Family History  Problem Relation Age of Onset   Cancer Mother    Hypertension Mother    Heart attack Mother    Liver disease Father    Hypertension Brother     Social History Social History   Tobacco Use   Smoking status: Every Day    Packs/day: 0.50    Types: Cigarettes    Last attempt to quit: 09/01/2018    Years since quitting: 3.7   Smokeless tobacco: Never  Vaping Use   Vaping Use: Never used  Substance Use Topics   Alcohol use: Yes    Comment: 08/19/2018 "glass of wine a couple times/year"   Drug use: No     Allergies   Patient has no active allergies.   Review of Systems Review of Systems  HENT:  Positive for mouth sores.      Physical Exam Triage Vital Signs ED Triage Vitals  Enc Vitals Group     BP 06/13/22 0830 122/76     Pulse Rate 06/13/22 0830 83     Resp 06/13/22 0830 17     Temp 06/13/22 0830 98 F (36.7 C)     Temp Source 06/13/22 0830 Oral     SpO2 06/13/22 0830 98 %     Weight --      Height --      Head Circumference --      Peak Flow --      Pain Score 06/13/22 0829 10     Pain Loc --      Pain Edu? --      Excl. in [place]? --    No data found.  Updated Vital Signs BP 122/76 (BP Location: Left Arm)   Pulse 83   Temp 98 F (36.7 C) (Oral)   Resp 17   LMP 02/03/2018   SpO2 98%   Visual Acuity Right Eye Distance:   Left Eye Distance:   Bilateral Distance:    Right Eye Near:   Left Eye Near:    Bilateral Near:     Physical Exam Vitals reviewed.  Constitutional:      General: She is not in acute distress.    Appearance: She is not toxic-appearing.  HENT:     Right Ear: Tympanic  membrane and ear canal normal.     Left Ear: Tympanic membrane and ear canal normal.     Nose: Nose normal.  Mouth/Throat:     Mouth: Mucous membranes are moist.     Pharynx: No oropharyngeal exudate or posterior oropharyngeal erythema.     Comments: There are had a form blisters at the right corner of the mouth, and under the lower lip.  No sign of secondary infection. Eyes:     Extraocular Movements: Extraocular movements intact.     Conjunctiva/sclera: Conjunctivae normal.     Pupils: Pupils are equal, round, and reactive to light.  Cardiovascular:     Rate and Rhythm: Normal rate and regular rhythm.     Heart sounds: No murmur heard. Pulmonary:     Effort: Pulmonary effort is normal. No respiratory distress.     Breath sounds: No stridor. No wheezing, rhonchi or rales.  Abdominal:     Palpations: Abdomen is soft.     Tenderness: There is no abdominal tenderness.  Musculoskeletal:     Cervical back: Neck supple.  Lymphadenopathy:     Cervical: No cervical adenopathy.  Skin:    Capillary Refill: Capillary refill takes less than 2 seconds.     Coloration: Skin is not jaundiced or pale.  Neurological:     General: No focal deficit present.     Mental Status: She is alert and oriented to person, place, and time.  Psychiatric:        Behavior: Behavior normal.      UC Treatments / Results  Labs (all labs ordered are listed, but only abnormal results are displayed) Labs Reviewed  RESP PANEL BY RT-PCR (RSV, FLU A&B, COVID)  [id number]  CERVICOVAGINAL ANCILLARY ONLY    EKG   Radiology No results found.  Procedures Procedures (including critical care time)  Medications Ordered in UC Medications  ketorolac (TORADOL) 30 MG/ML injection 30 mg (30 mg Intramuscular Given 06/13/22 0931)    Initial Impression / Assessment and Plan / UC Course  I have reviewed the triage vital signs and the nursing notes.  Pertinent labs & imaging results that were available during my  care of the patient were reviewed by me and considered in my medical decision making (see chart for details).        We are going to do respiratory swab for flu COVID and RSV, with her history of lupus.  If her COVID is positive she should have a prescription of Paxlovid, as her EGFR was over 60 when last done.  If positive for flu she should be treated with Tamiflu  Also stool testing will be done  Recommendation made for rheumatology and assistance requested to help her find a PCP.  It sounds like its been a long time since she had a primary care visit  Valtrex for the oral herpes Final Clinical Impressions(s) / UC Diagnoses   Final diagnoses:  Viral upper respiratory tract infection  Diarrhea, unspecified type  Systemic lupus erythematosus, unspecified SLE type, unspecified organ involvement status ([place])  Oral herpes  Mild intermittent asthma without complication     Discharge Instructions      - We will notify you if there is anything positive on the swab  Take valacyclovir 1000 mg--2 tabs every 12 hours for 2 doses.  This is for the blisters  Albuterol inhaler--do 2 puffs every 4 hours as needed for shortness of breath or wheezing  You can use the QR code/website at the back of the summary paperwork to schedule yourself a new patient appointment with primary care   You have been given a shot of Toradol 30 mg  today.  You can take ketorolac 10 mg pills every 6 hours as needed for pain.       ED Prescriptions     Medication Sig Dispense Auth. Provider   albuterol (VENTOLIN HFA) 108 (90 Base) MCG/ACT inhaler Inhale 2 puffs into the lungs every 4 (four) hours as needed for wheezing or shortness of breath. 18 g Barrett Henle, MD   valACYclovir (VALTREX) 1000 MG tablet Take 2 tablets (2,000 mg total) by mouth 2 (two) times daily for 2 doses. 4 tablet Daziah Hesler, Gwenlyn Perking, MD   ketorolac (TORADOL) 10 MG tablet Take 1 tablet (10 mg total) by mouth every 6 (six) hours  as needed (pain). 10 tablet Windy Carina Gwenlyn Perking, MD      PDMP not reviewed this encounter.   Barrett Henle, MD 06/13/22 [phone]

## 2022-06-13 NOTE — ED Triage Notes (Addendum)
Pt reports has lupus and thinks having a flare up, had mouth sores/lesions for week that are painful.   Pt adds that she had a fall month ago and ever since has a knot on posterior left leg that is tender and will send shocks down leg when you touch it. Reports that leg also has swelling.

## 2022-06-13 NOTE — Discharge Instructions (Addendum)
-   We will notify you if there is anything positive on the swab  Take valacyclovir 1000 mg--2 tabs every 12 hours for 2 doses.  This is for the blisters  Albuterol inhaler--do 2 puffs every 4 hours as needed for shortness of breath or wheezing  You can use the QR code/website at the back of the summary paperwork to schedule yourself a new patient appointment with primary care   You have been given a shot of Toradol 30 mg today.  You can take ketorolac 10 mg pills every 6 hours as needed for pain.

## 2022-06-13 NOTE — ED Notes (Signed)
Pt called this RN back to room. Informed this RN that she has had diarrhea for 2 weeks and would also like to be tested for STDs.

## 2022-06-15 ENCOUNTER — Telehealth (HOSPITAL_COMMUNITY): Payer: Self-pay | Admitting: Emergency Medicine

## 2022-06-15 MED ORDER — VALACYCLOVIR HCL 1 G PO TABS: 1000.0000 mg | ORAL_TABLET | Freq: Two times a day (BID) | ORAL | 0 refills | Status: AC

## 2022-06-15 NOTE — Telephone Encounter (Signed)
Patient called and states the one days worth of Valtrex did not work for the sores on her mouth.  Reviewed with Dr. Arlis Porta, and she approved 1 additional day at 1,'000mg'$  BID.  She asked me to inform the patient that if that does not begin to clear the rash she will need to be re-evaluated.   Updated patient, verified pharmacy, prescription sent

## 2022-06-19 ENCOUNTER — Telehealth (HOSPITAL_COMMUNITY): Payer: Self-pay | Admitting: Emergency Medicine

## 2022-06-19 LAB — CERVICOVAGINAL ANCILLARY ONLY
Bacterial Vaginitis (gardnerella): POSITIVE — AB
Candida Glabrata: NEGATIVE
Candida Vaginitis: NEGATIVE
Chlamydia: NEGATIVE
Comment: NEGATIVE
Comment: NEGATIVE
Comment: NEGATIVE
Comment: NEGATIVE
Comment: NEGATIVE
Comment: NORMAL
Neisseria Gonorrhea: NEGATIVE
Trichomonas: NEGATIVE

## 2022-06-19 MED ORDER — METRONIDAZOLE 500 MG PO TABS: 500.0000 mg | ORAL_TABLET | Freq: Two times a day (BID) | ORAL | 0 refills | Status: DC

## 2022-07-25 ENCOUNTER — Ambulatory Visit (HOSPITAL_BASED_OUTPATIENT_CLINIC_OR_DEPARTMENT_OTHER): Payer: Medicaid Other | Admitting: Nurse Practitioner

## 2022-08-07 ENCOUNTER — Ambulatory Visit (HOSPITAL_COMMUNITY)
Admission: EM | Admit: 2022-08-07 | Discharge: 2022-08-07 | Disposition: A | Discharge status: Home / Self Care | Payer: Medicaid Other | Attending: Family Medicine | Admitting: Family Medicine

## 2022-08-07 ENCOUNTER — Encounter (HOSPITAL_COMMUNITY): Payer: Self-pay

## 2022-08-07 DIAGNOSIS — K047 Periapical abscess without sinus: Secondary | ICD-10-CM | POA: Diagnosis not present

## 2022-08-07 DIAGNOSIS — J209 Acute bronchitis, unspecified: Secondary | ICD-10-CM | POA: Diagnosis not present

## 2022-08-07 DIAGNOSIS — H1033 Unspecified acute conjunctivitis, bilateral: Secondary | ICD-10-CM | POA: Diagnosis not present

## 2022-08-07 DIAGNOSIS — J069 Acute upper respiratory infection, unspecified: Secondary | ICD-10-CM

## 2022-08-07 LAB — CBG MONITORING, ED: Glucose-Capillary: 113 mg/dL — ABNORMAL HIGH (ref 70–99)

## 2022-08-07 MED ORDER — POLYMYXIN B-TRIMETHOPRIM 10000-0.1 UNIT/ML-% OP SOLN: 1.0000 [drp] | Freq: Four times a day (QID) | OPHTHALMIC | 0 refills | Status: DC

## 2022-08-07 MED ORDER — DM-GUAIFENESIN ER 30-600 MG PO TB12: 1.0000 | ORAL_TABLET | Freq: Two times a day (BID) | ORAL | 0 refills | Status: DC

## 2022-08-07 MED ORDER — AMOXICILLIN-POT CLAVULANATE 875-125 MG PO TABS: 1.0000 | ORAL_TABLET | Freq: Two times a day (BID) | ORAL | 0 refills | Status: DC

## 2022-08-07 MED ORDER — IBUPROFEN 600 MG PO TABS: 600.0000 mg | ORAL_TABLET | Freq: Four times a day (QID) | ORAL | 0 refills | Status: DC | PRN

## 2022-08-07 NOTE — Discharge Instructions (Signed)
Advised to use the Polytrim eyedrops, 1 drop in each eye 4 times a day for the next couple days until the eye is clear. Advised take the Augmentin every 12 hours with food to treat the upper respiratory and dental infection. Advised take Mucinex DM every 12 hours to help control cough congestion. Advised follow-up PCP or return to urgent care if symptoms fail to improve.

## 2022-08-07 NOTE — ED Provider Notes (Signed)
[place]    CSN: [id number] Arrival date & time: 08/07/22  1118      History   Chief Complaint Chief Complaint  Patient presents with   Cough   Otalgia   Nausea   Emesis   Shortness of Breath   Sore Throat   Back Pain    fatigue   Fatigue    HPI Dana Kennedy is a 56 y.o. female.   56 year old female presents with cough, eye drainage, tooth pain, fatigue.  Case for the past several days she has been having some upper respiratory congestion with sinus congestion, postnasal drip, rhinitis with purulent production.  She also indicates she is having bilateral ear pressure with right ear pain being the worst.  Patient also indicates she is having bilateral thigh irritation with redness and matting with greenish drainage.  She indicates she is having some light sensitivity when outside.  Patient also indicates she is having right lower molar tooth pain and discomfort for the past 1 to 2 days.  She is having right lower jaw swelling and discomfort which radiates to the right ear that has been present for the past 1 to 2 days.  Patient also indicates she is having chest congestion that with intermittent cough, production has been thick and greenish.  Patient indicates she is having fatigue, chills, sweats and body aches.  She relates that she has been taking some OTC medications without relief.  She denies having any wheezing.  She does relate being a smoker but she has not been smoking over the past couple days.  She is tolerating fluids well.  She relates she has not been around any family or friends that have been sick.   Cough Associated symptoms: ear pain and shortness of breath   Otalgia Associated symptoms: cough and vomiting   Emesis Associated symptoms: cough   Shortness of Breath Associated symptoms: cough, ear pain and vomiting   Sore Throat Associated symptoms include shortness of breath.  Back Pain   Past Medical History:  Diagnosis Date    Anemia    Asthma    Chest pain    atypcial chest pain evaluation 07/10/18 Philbert Riser, Coletta Memos, MD), echo order (NL LVEF, wall motion 09/2018)   Chronic lower back pain    Diverticulitis    GERD (gastroesophageal reflux disease)    History of blood transfusion    "HgB low w/lupus flareup" (08/19/2018)   History of ITP 2014   NOT PROBLEM NOW   Rheumatoid arthritis ([place])    "legs; all my bones" (08/19/2018)   SLE (systemic lupus erythematosus) ([place])    Umbilical hernia     Patient Active Problem List   Diagnosis Date Noted   Sepsis ([place]) 06/27/2020   Hypokalemia 06/27/2020   Tobacco use 01/01/2020   Acute lower UTI 12/31/2019   Asthma exacerbation 12/31/2019   Bacterial vaginosis 12/31/2019   CAP (community acquired pneumonia) 12/31/2019   Diverticulitis large intestine 10/24/2018   Acute diverticulitis 08/18/2018   Vaginal spotting 08/18/2018   GERD (gastroesophageal reflux disease)    Asthma    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   Continuous tobacco abuse 05/02/2016   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   Status post tonsillectomy 05/02/2016   Dehydration 10/12/2013   Idiopathic thrombocytopenic purpura ([place]) 09/05/2012   Lupus (systemic lupus erythematosus) ([place]) 09/05/2012   Avascular necrosis of hip ([place]) 06/14/2012    Past Surgical History:  Procedure Laterality Date   HERNIA REPAIR  PARTIAL COLECTOMY N/A 10/24/2018   Procedure: PARTIAL COLON RESECTION WITH ANASTOMOSIS; APPENDECTOMY  ERAS PATHWAY SPLENIC FLEXURE MOBILIZATION; UMBILICAL HERNIA REPAIR;  Surgeon: Kinsinger, Arta Bruce, MD;  Location: [facility];  Service: General;  Laterality: N/A;   stomach ulcer surgery  2008   TONSILLECTOMY     TOTAL HIP ARTHROPLASTY  06/14/2012   Procedure: TOTAL HIP ARTHROPLASTY ANTERIOR APPROACH;  Surgeon: Mcarthur Rossetti, MD;  Location: [facility];  Service: Orthopedics;  Laterality: Left;  Left Total Hip Arthroplasty, Anterior Approach C-Arm   TUBAL  LIGATION     UMBILICAL HERNIA REPAIR      OB History   No obstetric history on file.      Home Medications    Prior to Admission medications   Medication Sig Start Date End Date Taking? Authorizing Provider  amoxicillin-clavulanate (AUGMENTIN) 875-125 MG tablet Take 1 tablet by mouth every 12 (twelve) hours. 08/07/22  Yes Nyoka Lint, PA-C  dextromethorphan-guaiFENesin [facility] DM) 30-600 MG 12hr tablet Take 1 tablet by mouth 2 (two) times daily. 08/07/22  Yes Nyoka Lint, PA-C  ibuprofen (ADVIL) 600 MG tablet Take 1 tablet (600 mg total) by mouth every 6 (six) hours as needed. 08/07/22  Yes Nyoka Lint, PA-C  trimethoprim-polymyxin b (POLYTRIM) ophthalmic solution Place 1 drop into both eyes every 6 (six) hours. 08/07/22  Yes Nyoka Lint, PA-C  acetaminophen (TYLENOL) 325 MG tablet Take 2 tablets (650 mg total) by mouth every 6 (six) hours as needed for mild pain (or Fever >/= 101). 08/26/18   Roxan Hockey, MD  albuterol (VENTOLIN HFA) 108 (90 Base) MCG/ACT inhaler Inhale 2 puffs into the lungs every 4 (four) hours as needed for wheezing or shortness of breath. 06/13/22   Barrett Henle, MD  fluticasone (FLONASE) 50 MCG/ACT nasal spray Place 2 sprays into both nostrils daily. 06/28/20 07/28/20  Elodia Florence., MD  hydrochlorothiazide (MICROZIDE) 12.5 MG capsule Take 1 capsule (12.5 mg total) by mouth daily. 06/28/20 07/28/20  Elodia Florence., MD  ketorolac (TORADOL) 10 MG tablet Take 1 tablet (10 mg total) by mouth every 6 (six) hours as needed (pain). 06/13/22   Barrett Henle, MD  metroNIDAZOLE (FLAGYL) 500 MG tablet Take 1 tablet (500 mg total) by mouth 2 (two) times daily. 06/19/22   Lamptey, Myrene Galas, MD  Spacer/Aero-Holding Chambers (AEROCHAMBER Z-STAT PLUS/MEDIUM) inhaler Use as instructed 06/28/20   Elodia Florence., MD    Family History Family History  Problem Relation Age of Onset   Cancer Mother    Hypertension Mother    Heart attack Mother     Liver disease Father    Hypertension Brother     Social History Social History   Tobacco Use   Smoking status: Every Day    Packs/day: 0.50    Types: Cigarettes    Last attempt to quit: 09/01/2018    Years since quitting: 3.9   Smokeless tobacco: Never  Vaping Use   Vaping Use: Never used  Substance Use Topics   Alcohol use: Yes    Comment: 08/19/2018 "glass of wine a couple times/year"   Drug use: No     Allergies   Patient has no active allergies.   Review of Systems Review of Systems  HENT:  Positive for ear pain.   Respiratory:  Positive for cough and shortness of breath.   Gastrointestinal:  Positive for vomiting.  Musculoskeletal:  Positive for back pain.     Physical Exam Triage Vital Signs ED Triage  Vitals [08/07/22 1135]  Enc Vitals Group     BP (!) 142/102     Pulse Rate 81     Resp 12     Temp 98.8 F (37.1 C)     Temp Source Oral     SpO2 99 %     Weight      Height      Head Circumference      Peak Flow      Pain Score 10     Pain Loc      Pain Edu?      Excl. in [place]?    No data found.  Updated Vital Signs BP (!) 142/102 (BP Location: Right Arm)   Pulse 81   Temp 98.8 F (37.1 C) (Oral)   Resp 12   LMP 02/03/2018   SpO2 99%   Visual Acuity Right Eye Distance:   Left Eye Distance:   Bilateral Distance: pt states her vision is too blurry to see eye chart  Right Eye Near:   Left Eye Near:    Bilateral Near:     Physical Exam Constitutional:      Appearance: She is well-developed.  HENT:     Right Ear: Ear canal normal. Tympanic membrane is injected.     Left Ear: Ear canal normal. Tympanic membrane is injected.     Mouth/Throat:     Mouth: Mucous membranes are moist.     Pharynx: Posterior oropharyngeal erythema present. No oropharyngeal exudate.     Comments: Mouth: Right lower mid molar with mild swelling along the gumline and tenderness on palpation, no drainage. Eyes:     General: Lids are normal.     Extraocular  Movements: Extraocular movements intact.     Conjunctiva/sclera:     Right eye: Right conjunctiva is injected.     Left eye: Left conjunctiva is injected.  Cardiovascular:     Rate and Rhythm: Normal rate and regular rhythm.     Heart sounds: Normal heart sounds.  Pulmonary:     Effort: Pulmonary effort is normal.     Breath sounds: Normal air entry. Examination of the right-lower field reveals rhonchi. Examination of the left-lower field reveals rhonchi. Rhonchi (mild) present. No wheezing or rales.  Lymphadenopathy:     Cervical: No cervical adenopathy.  Neurological:     Mental Status: She is alert.      UC Treatments / Results  Labs (all labs ordered are listed, but only abnormal results are displayed) Labs Reviewed  CBG MONITORING, ED - Abnormal; Notable for the following components:      Result Value   Glucose-Capillary 113 (*)    All other components within normal limits    EKG   Radiology No results found.  Procedures Procedures (including critical care time)  Medications Ordered in UC Medications - No data to display  Initial Impression / Assessment and Plan / UC Course  I have reviewed the triage vital signs and the nursing notes.  Pertinent labs & imaging results that were available during my care of the patient were reviewed by me and considered in my medical decision making (see chart for details).    Plan: 1.  The upper respiratory tract infection will be treated with the following: A.  Mucinex DM every 12 hours to control cough and congestion. 2.  The tooth abscess will be treated with the following: A.  Augmentin 875 mg every 12 hours with food to treat the infection. 3.  The  acute conjunctivitis will be treated with the following: A.  Polytrim ophthalmic drops, 1 drop each eye 4 times a day for the next couple days until the infection clears. 4.  The acute bronchitis will be treated with the following: A.  Mucinex DM every 12 hours to control  cough and chest congestion. Advised follow-up PCP or return to urgent care if symptoms fail to improve. Final Clinical Impressions(s) / UC Diagnoses   Final diagnoses:  Viral upper respiratory tract infection  Tooth abscess  Acute bronchitis, unspecified organism  Acute bacterial conjunctivitis of both eyes     Discharge Instructions      Advised to use the Polytrim eyedrops, 1 drop in each eye 4 times a day for the next couple days until the eye is clear. Advised take the Augmentin every 12 hours with food to treat the upper respiratory and dental infection. Advised take Mucinex DM every 12 hours to help control cough congestion. Advised follow-up PCP or return to urgent care if symptoms fail to improve.    ED Prescriptions     Medication Sig Dispense Auth. Provider   dextromethorphan-guaiFENesin (MUCINEX DM) 30-600 MG 12hr tablet Take 1 tablet by mouth 2 (two) times daily. 20 tablet Nyoka Lint, PA-C   trimethoprim-polymyxin b (POLYTRIM) ophthalmic solution Place 1 drop into both eyes every 6 (six) hours. 10 mL Nyoka Lint, PA-C   amoxicillin-clavulanate (AUGMENTIN) 875-125 MG tablet Take 1 tablet by mouth every 12 (twelve) hours. 14 tablet Nyoka Lint, PA-C   ibuprofen (ADVIL) 600 MG tablet Take 1 tablet (600 mg total) by mouth every 6 (six) hours as needed. 30 tablet Nyoka Lint, PA-C      PDMP not reviewed this encounter.   Nyoka Lint, PA-C 08/07/22 1402

## 2023-07-09 ENCOUNTER — Other Ambulatory Visit: Payer: Self-pay

## 2023-07-09 ENCOUNTER — Emergency Department (HOSPITAL_COMMUNITY)
Admission: EM | Admit: 2023-07-09 | Discharge: 2023-07-09 | Disposition: A | Discharge status: Home / Self Care | Payer: MEDICAID | Attending: Emergency Medicine | Admitting: Emergency Medicine

## 2023-07-09 ENCOUNTER — Encounter (HOSPITAL_COMMUNITY): Payer: Self-pay

## 2023-07-09 ENCOUNTER — Emergency Department (HOSPITAL_COMMUNITY): Payer: MEDICAID

## 2023-07-09 DIAGNOSIS — R220 Localized swelling, mass and lump, head: Secondary | ICD-10-CM | POA: Diagnosis present

## 2023-07-09 DIAGNOSIS — K029 Dental caries, unspecified: Secondary | ICD-10-CM | POA: Diagnosis not present

## 2023-07-09 DIAGNOSIS — K047 Periapical abscess without sinus: Secondary | ICD-10-CM

## 2023-07-09 LAB — CBC WITH DIFFERENTIAL/PLATELET
Abs Immature Granulocytes: 0.03 10*3/uL (ref 0.00–0.07)
Basophils Absolute: 0 10*3/uL (ref 0.0–0.1)
Basophils Relative: 0 %
Eosinophils Absolute: 0.2 10*3/uL (ref 0.0–0.5)
Eosinophils Relative: 2 %
HCT: 36.2 % (ref 36.0–46.0)
Hemoglobin: 11.4 g/dL — ABNORMAL LOW (ref 12.0–15.0)
Immature Granulocytes: 0 %
Lymphocytes Relative: 26 %
Lymphs Abs: 2.2 10*3/uL (ref 0.7–4.0)
MCH: 27.4 pg (ref 26.0–34.0)
MCHC: 31.5 g/dL (ref 30.0–36.0)
MCV: 87 fL (ref 80.0–100.0)
Monocytes Absolute: 0.8 10*3/uL (ref 0.1–1.0)
Monocytes Relative: 9 %
Neutro Abs: 5.4 10*3/uL (ref 1.7–7.7)
Neutrophils Relative %: 63 %
Platelets: 326 10*3/uL (ref 150–400)
RBC: 4.16 MIL/uL (ref 3.87–5.11)
RDW: 14.7 % (ref 11.5–15.5)
WBC: 8.6 10*3/uL (ref 4.0–10.5)
nRBC: 0 % (ref 0.0–0.2)

## 2023-07-09 LAB — I-STAT CHEM 8, ED
BUN: 9 mg/dL (ref 6–20)
Calcium, Ion: 1.23 mmol/L (ref 1.15–1.40)
Chloride: 107 mmol/L (ref 98–111)
Creatinine, Ser: 0.7 mg/dL (ref 0.44–1.00)
Glucose, Bld: 105 mg/dL — ABNORMAL HIGH (ref 70–99)
HCT: 36 % (ref 36.0–46.0)
Hemoglobin: 12.2 g/dL (ref 12.0–15.0)
Potassium: 3.8 mmol/L (ref 3.5–5.1)
Sodium: 144 mmol/L (ref 135–145)
TCO2: 26 mmol/L (ref 22–32)

## 2023-07-09 MED ORDER — HYDROCHLOROTHIAZIDE 12.5 MG PO TABS
12.5000 mg | ORAL_TABLET | Freq: Once | ORAL | Status: AC
  Administered 2023-07-09: 12.5 mg via ORAL
  Filled 2023-07-09: qty 1

## 2023-07-09 MED ORDER — HYDROCHLOROTHIAZIDE 12.5 MG PO CAPS
12.5000 mg | ORAL_CAPSULE | Freq: Once | ORAL | Status: DC
  Filled 2023-07-09: qty 1

## 2023-07-09 MED ORDER — IOHEXOL 300 MG/ML  SOLN
75.0000 mL | Freq: Once | INTRAMUSCULAR | Status: AC | PRN
Start: 2023-07-09 — End: 2023-07-09
  Administered 2023-07-09: 75 mL via INTRAVENOUS

## 2023-07-09 MED ORDER — AMOXICILLIN-POT CLAVULANATE 875-125 MG PO TABS: 1.0000 | ORAL_TABLET | Freq: Two times a day (BID) | ORAL | 0 refills | Status: DC

## 2023-07-09 MED ORDER — OXYCODONE-ACETAMINOPHEN 5-325 MG PO TABS: 1.0000 | ORAL_TABLET | Freq: Four times a day (QID) | ORAL | 0 refills | Status: DC | PRN

## 2023-07-09 MED ORDER — SODIUM CHLORIDE 0.9 % IV SOLN
3.0000 g | Freq: Once | INTRAVENOUS | Status: AC
  Administered 2023-07-09: 3 g via INTRAVENOUS
  Filled 2023-07-09: qty 8

## 2023-07-09 MED ORDER — MORPHINE SULFATE (PF) 4 MG/ML IV SOLN
6.0000 mg | Freq: Once | INTRAVENOUS | Status: AC
  Administered 2023-07-09: 6 mg via INTRAVENOUS
  Filled 2023-07-09: qty 2

## 2023-07-09 NOTE — ED Provider Notes (Signed)
[place] EMERGENCY DEPARTMENT AT [facility] Provider Note   CSN: [id number] Arrival date & time: 07/09/23  8295     History  No chief complaint on file.   Dana Kennedy is a 57 y.o. female.  57 year old female presents with several days of right-sided lower mandible tooth pain.  Today when she woke up she noted increasing swelling under her mandible as well as trouble swallowing.  States that she has been on antibiotics for known dental infection.  Had a fever yesterday but not today.  States she can handle her secretions.  Denies any intraoral drainage.  Using oral medications at home for pain without relief       Home Medications Prior to Admission medications   Medication Sig Start Date End Date Taking? Authorizing Provider  acetaminophen (TYLENOL) 325 MG tablet Take 2 tablets (650 mg total) by mouth every 6 (six) hours as needed for mild pain (or Fever >/= 101). 08/26/18   Shon Hale, MD  albuterol (VENTOLIN HFA) 108 (90 Base) MCG/ACT inhaler Inhale 2 puffs into the lungs every 4 (four) hours as needed for wheezing or shortness of breath. 06/13/22   Zenia Resides, MD  amoxicillin-clavulanate (AUGMENTIN) 875-125 MG tablet Take 1 tablet by mouth every 12 (twelve) hours. 08/07/22   Ellsworth Lennox, PA-C  dextromethorphan-guaiFENesin [facility] DM) 30-600 MG 12hr tablet Take 1 tablet by mouth 2 (two) times daily. 08/07/22   Ellsworth Lennox, PA-C  fluticasone [facility]) 50 MCG/ACT nasal spray Place 2 sprays into both nostrils daily. 06/28/20 07/28/20  Zigmund Daniel., MD  hydrochlorothiazide (MICROZIDE) 12.5 MG capsule Take 1 capsule (12.5 mg total) by mouth daily. 06/28/20 07/28/20  Zigmund Daniel., MD  ibuprofen (ADVIL) 600 MG tablet Take 1 tablet (600 mg total) by mouth every 6 (six) hours as needed. 08/07/22   Ellsworth Lennox, PA-C  ketorolac (TORADOL) 10 MG tablet Take 1 tablet (10 mg total) by mouth every 6 (six) hours as needed (pain). 06/13/22    Zenia Resides, MD  metroNIDAZOLE (FLAGYL) 500 MG tablet Take 1 tablet (500 mg total) by mouth 2 (two) times daily. 06/19/22   Merrilee Jansky, MD  Spacer/Aero-Holding Chambers (AEROCHAMBER Z-STAT PLUS/MEDIUM) inhaler Use as instructed 06/28/20   Zigmund Daniel., MD  trimethoprim-polymyxin b [facility]) ophthalmic solution Place 1 drop into both eyes every 6 (six) hours. 08/07/22   Ellsworth Lennox, PA-C      Allergies    Patient has no active allergies.    Review of Systems   Review of Systems  All other systems reviewed and are negative.   Physical Exam Updated Vital Signs BP (!) 162/101 (BP Location: Left Arm)   Pulse 75   Temp 97.7 F (36.5 C) (Oral)   Resp 18   Ht 1.651 m (5\' 5" )   Wt 81.6 kg   LMP 02/03/2018   SpO2 95%   BMI 29.95 kg/m  Physical Exam Vitals and nursing note reviewed.  Constitutional:      General: She is not in acute distress.    Appearance: Normal appearance. She is well-developed. She is not toxic-appearing.  HENT:     Head:     Comments: Patient with submandibular swelling mostly on the right side.  No erythema noted.  Prominent submandibular lymph nodes noted on the right side    Mouth/Throat:   Eyes:     General: Lids are normal.     Conjunctiva/sclera: Conjunctivae normal.     Pupils: Pupils  are equal, round, and reactive to light.  Neck:     Thyroid: No thyroid mass.     Trachea: No tracheal deviation.  Cardiovascular:     Rate and Rhythm: Normal rate and regular rhythm.     Heart sounds: Normal heart sounds. No murmur heard.    No gallop.  Pulmonary:     Effort: Pulmonary effort is normal. No respiratory distress.     Breath sounds: Normal breath sounds. No stridor. No decreased breath sounds, wheezing, rhonchi or rales.  Abdominal:     General: There is no distension.     Palpations: Abdomen is soft.     Tenderness: There is no abdominal tenderness. There is no rebound.  Musculoskeletal:        General: No tenderness.  Normal range of motion.     Cervical back: Normal range of motion and neck supple.  Skin:    General: Skin is warm and dry.     Findings: No abrasion or rash.  Neurological:     Mental Status: She is alert and oriented to person, place, and time. Mental status is at baseline.     GCS: GCS eye subscore is 4. GCS verbal subscore is 5. GCS motor subscore is 6.     Cranial Nerves: Cranial nerves are intact. No cranial nerve deficit.     Sensory: No sensory deficit.     Motor: Motor function is intact.  Psychiatric:        Attention and Perception: Attention normal.        Speech: Speech normal.        Behavior: Behavior normal.     ED Results / Procedures / Treatments   Labs (all labs ordered are listed, but only abnormal results are displayed) Labs Reviewed  CBC WITH DIFFERENTIAL/PLATELET  I-STAT CHEM 8, ED    EKG None  Radiology No results found.  Procedures Procedures    Medications Ordered in ED Medications  morphine (PF) 4 MG/ML injection 6 mg (has no administration in time range)    ED Course/ Medical Decision Making/ A&P                                 Medical Decision Making Amount and/or Complexity of Data Reviewed Labs: ordered. Radiology: ordered.  Risk Prescription drug management.   Patient given IV fluids as well as pain medication here.  Concern for possible Ludwig's angina and patient had CT of maxillofacial and soft tissue neck which was negative for deep submandibular space infection but did show some lymphadenopathy.  Also showed evidence of an abscess at the right mandible.  Patient treated IV Unasyn.  Case discussed with Dr. Ross Marcus from oral surgery.  He reviewed the patient's films.  He recommends patient be placed on Augmentin and he will see the patient tomorrow morning 8:00.  Patient has no evidence of airway compromise.  No evidence of sepsis at this time.  She was stable for discharge        Final Clinical Impression(s) / ED  Diagnoses Final diagnoses:  None    Rx / DC Orders ED Discharge Orders     None         Lorre Nick, MD 07/09/23 1328

## 2023-07-09 NOTE — ED Notes (Signed)
Pt discharged home. Discharge information discussed. No s/s of distress observed during discharge. 

## 2023-07-09 NOTE — ED Triage Notes (Signed)
The pt was bib EMS. The pt called and c/o swelling to right jaw. The pt had an abscess 2 months ago. She reports going to the dentist and having tooth pulled. It was not pulled and swelling became worse. The pt reports currently being on abt for abscess, meds unknown.The past 2 days the pain has been 10/10 and has affected her breathing with a productive cough, sputum yellow in color. Has not been tested for Covid. VS B/P 160/92, P 73, R 16, O2 Sats 98%RA.

## 2023-09-16 ENCOUNTER — Emergency Department (HOSPITAL_COMMUNITY): Payer: MEDICAID

## 2023-09-16 ENCOUNTER — Inpatient Hospital Stay (HOSPITAL_COMMUNITY)
Admission: EM | Admit: 2023-09-16 | Discharge: 2023-09-21 | DRG: 871 | Disposition: A | Discharge status: Home / Self Care | Payer: MEDICAID | Attending: Internal Medicine | Admitting: Internal Medicine

## 2023-09-16 DIAGNOSIS — E119 Type 2 diabetes mellitus without complications: Secondary | ICD-10-CM | POA: Diagnosis present

## 2023-09-16 DIAGNOSIS — A419 Sepsis, unspecified organism: Principal | ICD-10-CM | POA: Diagnosis present

## 2023-09-16 DIAGNOSIS — K219 Gastro-esophageal reflux disease without esophagitis: Secondary | ICD-10-CM | POA: Diagnosis present

## 2023-09-16 DIAGNOSIS — J189 Pneumonia, unspecified organism: Principal | ICD-10-CM | POA: Diagnosis present

## 2023-09-16 DIAGNOSIS — J45909 Unspecified asthma, uncomplicated: Secondary | ICD-10-CM | POA: Diagnosis present

## 2023-09-16 DIAGNOSIS — Z8249 Family history of ischemic heart disease and other diseases of the circulatory system: Secondary | ICD-10-CM

## 2023-09-16 DIAGNOSIS — F17210 Nicotine dependence, cigarettes, uncomplicated: Secondary | ICD-10-CM | POA: Diagnosis present

## 2023-09-16 DIAGNOSIS — Z96642 Presence of left artificial hip joint: Secondary | ICD-10-CM | POA: Diagnosis present

## 2023-09-16 DIAGNOSIS — Z8711 Personal history of peptic ulcer disease: Secondary | ICD-10-CM

## 2023-09-16 DIAGNOSIS — J9601 Acute respiratory failure with hypoxia: Secondary | ICD-10-CM | POA: Diagnosis present

## 2023-09-16 DIAGNOSIS — J45901 Unspecified asthma with (acute) exacerbation: Secondary | ICD-10-CM | POA: Diagnosis present

## 2023-09-16 DIAGNOSIS — Z79899 Other long term (current) drug therapy: Secondary | ICD-10-CM

## 2023-09-16 DIAGNOSIS — R0902 Hypoxemia: Secondary | ICD-10-CM

## 2023-09-16 DIAGNOSIS — R590 Localized enlarged lymph nodes: Secondary | ICD-10-CM | POA: Diagnosis present

## 2023-09-16 DIAGNOSIS — M069 Rheumatoid arthritis, unspecified: Secondary | ICD-10-CM | POA: Diagnosis present

## 2023-09-16 DIAGNOSIS — E876 Hypokalemia: Secondary | ICD-10-CM | POA: Diagnosis present

## 2023-09-16 DIAGNOSIS — Z1152 Encounter for screening for COVID-19: Secondary | ICD-10-CM

## 2023-09-16 DIAGNOSIS — M329 Systemic lupus erythematosus, unspecified: Secondary | ICD-10-CM | POA: Diagnosis present

## 2023-09-16 LAB — CBC
HCT: 40.8 % (ref 36.0–46.0)
Hemoglobin: 13.5 g/dL (ref 12.0–15.0)
MCH: 27.6 pg (ref 26.0–34.0)
MCHC: 33.1 g/dL (ref 30.0–36.0)
MCV: 83.3 fL (ref 80.0–100.0)
Platelets: 351 10*3/uL (ref 150–400)
RBC: 4.9 MIL/uL (ref 3.87–5.11)
RDW: 14.2 % (ref 11.5–15.5)
WBC: 10.9 10*3/uL — ABNORMAL HIGH (ref 4.0–10.5)
nRBC: 0 % (ref 0.0–0.2)

## 2023-09-16 LAB — BASIC METABOLIC PANEL
Anion gap: 13 (ref 5–15)
BUN: 12 mg/dL (ref 6–20)
CO2: 21 mmol/L — ABNORMAL LOW (ref 22–32)
Calcium: 9.3 mg/dL (ref 8.9–10.3)
Chloride: 104 mmol/L (ref 98–111)
Creatinine, Ser: 0.79 mg/dL (ref 0.44–1.00)
GFR, Estimated: >60 mL/min (ref >60)
Glucose, Bld: 121 mg/dL — ABNORMAL HIGH (ref 70–99)
Potassium: 3.5 mmol/L (ref 3.5–5.1)
Sodium: 138 mmol/L (ref 135–145)

## 2023-09-16 LAB — SARS CORONAVIRUS 2 BY RT PCR: SARS Coronavirus 2 by RT PCR: NEGATIVE

## 2023-09-16 LAB — TROPONIN I (HIGH SENSITIVITY): Troponin I (High Sensitivity): 3 ng/L (ref <18)

## 2023-09-16 NOTE — ED Notes (Signed)
Pt told to remove her coat due to running a fever, pt refused.

## 2023-09-16 NOTE — ED Triage Notes (Signed)
Patient arrived with complaints of generalized body aches, chest pain, and a productive cough over the last week. Loss of taste and smell, concerned for Covid-19. 650 tylenol given with EMS   States she has also felt stuff falling from her head and concerned she may have lice.

## 2023-09-17 ENCOUNTER — Observation Stay (HOSPITAL_COMMUNITY): Payer: MEDICAID

## 2023-09-17 ENCOUNTER — Other Ambulatory Visit: Payer: Self-pay

## 2023-09-17 DIAGNOSIS — J45901 Unspecified asthma with (acute) exacerbation: Secondary | ICD-10-CM | POA: Diagnosis present

## 2023-09-17 DIAGNOSIS — A419 Sepsis, unspecified organism: Secondary | ICD-10-CM | POA: Diagnosis present

## 2023-09-17 DIAGNOSIS — E119 Type 2 diabetes mellitus without complications: Secondary | ICD-10-CM | POA: Diagnosis present

## 2023-09-17 DIAGNOSIS — J9601 Acute respiratory failure with hypoxia: Secondary | ICD-10-CM | POA: Diagnosis present

## 2023-09-17 DIAGNOSIS — M069 Rheumatoid arthritis, unspecified: Secondary | ICD-10-CM | POA: Diagnosis present

## 2023-09-17 DIAGNOSIS — Z8249 Family history of ischemic heart disease and other diseases of the circulatory system: Secondary | ICD-10-CM | POA: Diagnosis not present

## 2023-09-17 DIAGNOSIS — R590 Localized enlarged lymph nodes: Secondary | ICD-10-CM | POA: Diagnosis present

## 2023-09-17 DIAGNOSIS — M329 Systemic lupus erythematosus, unspecified: Secondary | ICD-10-CM | POA: Diagnosis present

## 2023-09-17 DIAGNOSIS — J189 Pneumonia, unspecified organism: Secondary | ICD-10-CM | POA: Diagnosis present

## 2023-09-17 DIAGNOSIS — F17210 Nicotine dependence, cigarettes, uncomplicated: Secondary | ICD-10-CM | POA: Diagnosis present

## 2023-09-17 DIAGNOSIS — R059 Cough, unspecified: Secondary | ICD-10-CM | POA: Diagnosis present

## 2023-09-17 DIAGNOSIS — K219 Gastro-esophageal reflux disease without esophagitis: Secondary | ICD-10-CM | POA: Diagnosis present

## 2023-09-17 DIAGNOSIS — Z8711 Personal history of peptic ulcer disease: Secondary | ICD-10-CM | POA: Diagnosis not present

## 2023-09-17 DIAGNOSIS — Z96642 Presence of left artificial hip joint: Secondary | ICD-10-CM | POA: Diagnosis present

## 2023-09-17 DIAGNOSIS — Z1152 Encounter for screening for COVID-19: Secondary | ICD-10-CM | POA: Diagnosis not present

## 2023-09-17 DIAGNOSIS — Z79899 Other long term (current) drug therapy: Secondary | ICD-10-CM | POA: Diagnosis not present

## 2023-09-17 LAB — RESPIRATORY PANEL BY PCR

## 2023-09-17 LAB — CBG MONITORING, ED
Glucose-Capillary: 135 mg/dL — ABNORMAL HIGH (ref 70–99)
Glucose-Capillary: 133 mg/dL — ABNORMAL HIGH (ref 70–99)

## 2023-09-17 LAB — GLUCOSE, CAPILLARY
Glucose-Capillary: 116 mg/dL — ABNORMAL HIGH (ref 70–99)
Glucose-Capillary: 130 mg/dL — ABNORMAL HIGH (ref 70–99)

## 2023-09-17 LAB — TROPONIN I (HIGH SENSITIVITY): Troponin I (High Sensitivity): 3 ng/L (ref <18)

## 2023-09-17 LAB — HIV ANTIBODY (ROUTINE TESTING W REFLEX): HIV Screen 4th Generation wRfx: NONREACTIVE

## 2023-09-17 LAB — I-STAT CG4 LACTIC ACID, ED: Lactic Acid, Venous: 0.6 mmol/L (ref 0.5–1.9)

## 2023-09-17 MED ORDER — LIDOCAINE 5 % EX PTCH
1.0000 | MEDICATED_PATCH | Freq: Once | CUTANEOUS | Status: AC
  Administered 2023-09-17: 1 via TRANSDERMAL
  Filled 2023-09-17: qty 1

## 2023-09-17 MED ORDER — ALBUTEROL SULFATE (2.5 MG/3ML) 0.083% IN NEBU
5.0000 mg | INHALATION_SOLUTION | Freq: Once | RESPIRATORY_TRACT | Status: AC
  Administered 2023-09-17: 5 mg via RESPIRATORY_TRACT
  Filled 2023-09-17: qty 6

## 2023-09-17 MED ORDER — ALBUTEROL SULFATE (2.5 MG/3ML) 0.083% IN NEBU: 2.5000 mg | INHALATION_SOLUTION | RESPIRATORY_TRACT | Status: DC | PRN

## 2023-09-17 MED ORDER — HYDROCOD POLI-CHLORPHE POLI ER 10-8 MG/5ML PO SUER
5.0000 mL | Freq: Once | ORAL | Status: AC
  Administered 2023-09-17: 5 mL via ORAL
  Filled 2023-09-17: qty 5

## 2023-09-17 MED ORDER — ACETAMINOPHEN 325 MG PO TABS
650.0000 mg | ORAL_TABLET | Freq: Once | ORAL | Status: AC | PRN
  Administered 2023-09-17: 650 mg via ORAL
  Filled 2023-09-17: qty 2

## 2023-09-17 MED ORDER — ONDANSETRON HCL 4 MG/2ML IJ SOLN: 4.0000 mg | Freq: Four times a day (QID) | INTRAMUSCULAR | Status: DC | PRN

## 2023-09-17 MED ORDER — SODIUM CHLORIDE 0.9 % IV SOLN: 500.0000 mg | INTRAVENOUS | Status: DC

## 2023-09-17 MED ORDER — ACETAMINOPHEN 650 MG RE SUPP: 650.0000 mg | Freq: Four times a day (QID) | RECTAL | Status: DC | PRN

## 2023-09-17 MED ORDER — ONDANSETRON HCL 4 MG PO TABS: 4.0000 mg | ORAL_TABLET | Freq: Four times a day (QID) | ORAL | Status: DC | PRN

## 2023-09-17 MED ORDER — ENOXAPARIN SODIUM 60 MG/0.6ML IJ SOSY
50.0000 mg | PREFILLED_SYRINGE | INTRAMUSCULAR | Status: DC
Start: 2023-09-17 — End: 2023-09-21
  Administered 2023-09-17 – 2023-09-20 (×4): 50 mg via SUBCUTANEOUS
  Filled 2023-09-17 (×4): qty 0.6

## 2023-09-17 MED ORDER — IPRATROPIUM-ALBUTEROL 0.5-2.5 (3) MG/3ML IN SOLN
3.0000 mL | RESPIRATORY_TRACT | Status: DC
  Administered 2023-09-17 – 2023-09-18 (×5): 3 mL via RESPIRATORY_TRACT
  Filled 2023-09-17 (×6): qty 3

## 2023-09-17 MED ORDER — AZITHROMYCIN 250 MG PO TABS
500.0000 mg | ORAL_TABLET | Freq: Once | ORAL | Status: AC
  Administered 2023-09-17: 500 mg via ORAL
  Filled 2023-09-17: qty 2

## 2023-09-17 MED ORDER — AZITHROMYCIN 500 MG PO TABS
500.0000 mg | ORAL_TABLET | Freq: Every day | ORAL | Status: DC
  Administered 2023-09-18 – 2023-09-19 (×2): 500 mg via ORAL
  Filled 2023-09-17 (×2): qty 1

## 2023-09-17 MED ORDER — PREDNISONE 20 MG PO TABS: 40.0000 mg | ORAL_TABLET | Freq: Every day | ORAL | Status: DC

## 2023-09-17 MED ORDER — CEFTRIAXONE SODIUM 1 G IJ SOLR
1.0000 g | INTRAMUSCULAR | Status: DC
  Administered 2023-09-18 – 2023-09-19 (×2): 1 g via INTRAVENOUS
  Filled 2023-09-17 (×2): qty 10

## 2023-09-17 MED ORDER — OXYCODONE-ACETAMINOPHEN 5-325 MG PO TABS
1.0000 | ORAL_TABLET | Freq: Four times a day (QID) | ORAL | Status: DC | PRN
  Administered 2023-09-17 – 2023-09-20 (×7): 1 via ORAL
  Filled 2023-09-17 (×7): qty 1

## 2023-09-17 MED ORDER — INSULIN ASPART 100 UNIT/ML IJ SOLN
0.0000 [IU] | Freq: Every day | INTRAMUSCULAR | Status: DC
  Administered 2023-09-18: 2 [IU] via SUBCUTANEOUS
  Filled 2023-09-17: qty 0.05

## 2023-09-17 MED ORDER — SODIUM CHLORIDE 0.9 % IV BOLUS
1000.0000 mL | Freq: Once | INTRAVENOUS | Status: AC
  Administered 2023-09-17: 1000 mL via INTRAVENOUS

## 2023-09-17 MED ORDER — INSULIN ASPART 100 UNIT/ML IJ SOLN
0.0000 [IU] | Freq: Three times a day (TID) | INTRAMUSCULAR | Status: DC
  Administered 2023-09-17 – 2023-09-20 (×8): 2 [IU] via SUBCUTANEOUS
  Administered 2023-09-21: 1 [IU] via SUBCUTANEOUS
  Filled 2023-09-17: qty 0.15

## 2023-09-17 MED ORDER — ACETAMINOPHEN 325 MG PO TABS
650.0000 mg | ORAL_TABLET | Freq: Four times a day (QID) | ORAL | Status: DC | PRN
Start: 2023-09-17 — End: 2023-09-21
  Administered 2023-09-17 – 2023-09-18 (×3): 650 mg via ORAL
  Filled 2023-09-17 (×3): qty 2

## 2023-09-17 MED ORDER — IPRATROPIUM-ALBUTEROL 0.5-2.5 (3) MG/3ML IN SOLN
3.0000 mL | Freq: Once | RESPIRATORY_TRACT | Status: AC
  Administered 2023-09-17: 3 mL via RESPIRATORY_TRACT
  Filled 2023-09-17: qty 3

## 2023-09-17 MED ORDER — SODIUM CHLORIDE 0.9 % IV SOLN
1.0000 g | Freq: Once | INTRAVENOUS | Status: AC
  Administered 2023-09-17: 1 g via INTRAVENOUS
  Filled 2023-09-17: qty 10

## 2023-09-17 MED ORDER — TRAZODONE HCL 50 MG PO TABS
25.0000 mg | ORAL_TABLET | Freq: Every evening | ORAL | Status: DC | PRN
  Administered 2023-09-17 – 2023-09-20 (×3): 25 mg via ORAL
  Filled 2023-09-17 (×3): qty 1

## 2023-09-17 MED ORDER — METHYLPREDNISOLONE SODIUM SUCC 40 MG IJ SOLR: 40.0000 mg | Freq: Two times a day (BID) | INTRAMUSCULAR | Status: DC

## 2023-09-17 MED ORDER — IOHEXOL 350 MG/ML SOLN
75.0000 mL | Freq: Once | INTRAVENOUS | Status: AC | PRN
  Administered 2023-09-17: 75 mL via INTRAVENOUS

## 2023-09-17 NOTE — ED Notes (Signed)
Patient placed on 2L of O2 via [place] under instruction of PA for O2 in the 80s

## 2023-09-17 NOTE — ED Provider Notes (Cosign Needed)
[place] EMERGENCY DEPARTMENT AT [facility] Provider Note   CSN: [id number] Arrival date & time: 09/16/23  1934     History  Chief Complaint  Patient presents with   Cough   Chest Pain    Dana Kennedy is a 57 y.o. female.  Patient with history of asthma, continuous smoker, lupus, rheumatoid arthritis, presents with symptoms progressively worsening over the last week of fever/chills, cough that is sometimes productive, chest pain, wheezing, SOB. She also report nausea with vomiting that is not post-tussive. No diarrhea, abdominal pain.   The history is provided by the patient. No language interpreter was used.  Cough Associated symptoms: chest pain   Chest Pain Associated symptoms: cough        Home Medications Prior to Admission medications   Medication Sig Start Date End Date Taking? Authorizing Provider  acetaminophen (TYLENOL) 325 MG tablet Take 2 tablets (650 mg total) by mouth every 6 (six) hours as needed for mild pain (or Fever >/= 101). 08/26/18   Shon Hale, MD  albuterol (VENTOLIN HFA) 108 (90 Base) MCG/ACT inhaler Inhale 2 puffs into the lungs every 4 (four) hours as needed for wheezing or shortness of breath. 06/13/22   Zenia Resides, MD  amoxicillin-clavulanate (AUGMENTIN) 875-125 MG tablet Take 1 tablet by mouth every 12 (twelve) hours. 08/07/22   Ellsworth Lennox, PA-C  amoxicillin-clavulanate (AUGMENTIN) 875-125 MG tablet Take 1 tablet by mouth every 12 (twelve) hours. 07/09/23   Lorre Nick, MD  dextromethorphan-guaiFENesin [facility] DM) 30-600 MG 12hr tablet Take 1 tablet by mouth 2 (two) times daily. 08/07/22   Ellsworth Lennox, PA-C  fluticasone [facility]) 50 MCG/ACT nasal spray Place 2 sprays into both nostrils daily. 06/28/20 07/28/20  Zigmund Daniel., MD  hydrochlorothiazide (MICROZIDE) 12.5 MG capsule Take 1 capsule (12.5 mg total) by mouth daily. 06/28/20 07/28/20  Zigmund Daniel., MD  ibuprofen (ADVIL) 600 MG  tablet Take 1 tablet (600 mg total) by mouth every 6 (six) hours as needed. 08/07/22   Ellsworth Lennox, PA-C  ketorolac (TORADOL) 10 MG tablet Take 1 tablet (10 mg total) by mouth every 6 (six) hours as needed (pain). 06/13/22   Zenia Resides, MD  metroNIDAZOLE (FLAGYL) 500 MG tablet Take 1 tablet (500 mg total) by mouth 2 (two) times daily. 06/19/22   Lamptey, Britta Mccreedy, MD  oxyCODONE-acetaminophen (PERCOCET/ROXICET) 5-325 MG tablet Take 1 tablet by mouth every 6 (six) hours as needed for severe pain (pain score 7-10). 07/09/23   Lorre Nick, MD  Spacer/Aero-Holding Chambers (AEROCHAMBER Z-STAT PLUS/MEDIUM) inhaler Use as instructed 06/28/20   Zigmund Daniel., MD  trimethoprim-polymyxin b [facility]) ophthalmic solution Place 1 drop into both eyes every 6 (six) hours. 08/07/22   Ellsworth Lennox, PA-C      Allergies    Patient has no known allergies.    Review of Systems   Review of Systems  Respiratory:  Positive for cough.   Cardiovascular:  Positive for chest pain.    Physical Exam Updated Vital Signs BP 136/87   Pulse (!) 110   Temp (!) 100.7 F (38.2 C) (Oral)   Resp (!) 23   LMP 02/03/2018   SpO2 94%  Physical Exam Constitutional:      Appearance: She is well-developed.  HENT:     Head: Normocephalic.  Cardiovascular:     Rate and Rhythm: Regular rhythm. Tachycardia present.     Heart sounds: No murmur heard. Pulmonary:     Effort: Pulmonary effort  is normal.     Breath sounds: Examination of the right-lower field reveals rales. Examination of the left-lower field reveals rales. Wheezing and rales present. No rhonchi.     Comments: Actively coughing. Abdominal:     Palpations: Abdomen is soft.     Tenderness: There is no abdominal tenderness. There is no guarding or rebound.  Musculoskeletal:        General: Normal range of motion.     Cervical back: Normal range of motion and neck supple.     Right lower leg: No edema.     Left lower leg: No edema.  Skin:     General: Skin is warm and dry.  Neurological:     General: No focal deficit present.     Mental Status: She is alert and oriented to person, place, and time.     ED Results / Procedures / Treatments   Labs (all labs ordered are listed, but only abnormal results are displayed) Labs Reviewed  BASIC METABOLIC PANEL - Abnormal; Notable for the following components:      Result Value   CO2 21 (*)    Glucose, Bld 121 (*)    All other components within normal limits  CBC - Abnormal; Notable for the following components:   WBC 10.9 (*)    All other components within normal limits  SARS CORONAVIRUS 2 BY RT PCR  I-STAT CG4 LACTIC ACID, ED  I-STAT CG4 LACTIC ACID, ED  TROPONIN I (HIGH SENSITIVITY)  TROPONIN I (HIGH SENSITIVITY)   Results for orders placed or performed during the hospital encounter of 09/16/23  SARS Coronavirus 2 by RT PCR (hospital order, performed in [facility] hospital lab) *cepheid single result test* Anterior Nasal Swab   Collection Time: 09/16/23  7:45 PM   Specimen: Anterior Nasal Swab  Result Value Ref Range   SARS Coronavirus 2 by RT PCR NEGATIVE NEGATIVE  Basic metabolic panel   Collection Time: 09/16/23  8:33 PM  Result Value Ref Range   Sodium 138 135 - 145 mmol/L   Potassium 3.5 3.5 - 5.1 mmol/L   Chloride 104 98 - 111 mmol/L   CO2 21 (L) 22 - 32 mmol/L   Glucose, Bld 121 (H) 70 - 99 mg/dL   BUN 12 6 - 20 mg/dL   Creatinine, Ser 9.56 0.44 - 1.00 mg/dL   Calcium 9.3 8.9 - 21.3 mg/dL   GFR, Estimated >08 >65 mL/min   Anion gap 13 5 - 15  CBC   Collection Time: 09/16/23  8:33 PM  Result Value Ref Range   WBC 10.9 (H) 4.0 - 10.5 K/uL   RBC 4.90 3.87 - 5.11 MIL/uL   Hemoglobin 13.5 12.0 - 15.0 g/dL   HCT 78.4 69.6 - 29.5 %   MCV 83.3 80.0 - 100.0 fL   MCH 27.6 26.0 - 34.0 pg   MCHC 33.1 30.0 - 36.0 g/dL   RDW 28.4 13.2 - 44.0 %   Platelets 351 150 - 400 K/uL   nRBC 0.0 0.0 - 0.2 %  Troponin I (High Sensitivity)   Collection Time: 09/16/23  8:33  PM  Result Value Ref Range   Troponin I (High Sensitivity) 3 <18 ng/L  Troponin I (High Sensitivity)   Collection Time: 09/16/23  9:45 PM  Result Value Ref Range   Troponin I (High Sensitivity) 3 <18 ng/L  I-Stat Lactic Acid   Collection Time: 09/17/23  2:43 AM  Result Value Ref Range   Lactic Acid, Venous 0.6  0.5 - 1.9 mmol/L    EKG EKG Interpretation Date/Time:  Sunday September 16 2023 20:29:17 EST Ventricular Rate:  101 PR Interval:  180 QRS Duration:  98 QT Interval:  347 QTC Calculation: 450 R Axis:   7  Text Interpretation: Sinus tachycardia Probable left atrial enlargement Low voltage, precordial leads Abnormal R-wave progression, early transition Confirmed by Ross Marcus ([id number]) on 09/17/2023 1:01:32 AM  Radiology DG Chest 2 View Result Date: 09/16/2023 CLINICAL DATA:  One-week history of generalized body aches, chest pain, productive cough EXAM: CHEST - 2 VIEW COMPARISON:  Chest radiograph dated 04/15/2022 FINDINGS: Patient is rotated slightly to the right. Normal lung volumes. Right basilar patchy opacity. No pleural effusion or pneumothorax. The heart size and mediastinal contours are within normal limits. No acute osseous abnormality. IMPRESSION: Right basilar patchy opacity, which may represent atelectasis or pneumonia. Electronically Signed   By: Agustin Cree M.D.   On: 09/16/2023 20:45    Procedures Procedures    Medications Ordered in ED Medications  ipratropium-albuterol (DUONEB) 0.5-2.5 (3) MG/3ML nebulizer solution 3 mL (has no administration in time range)  cefTRIAXone (ROCEPHIN) 1 g in sodium chloride 0.9 % 100 mL IVPB (has no administration in time range)  acetaminophen (TYLENOL) tablet 650 mg (650 mg Oral Given 09/17/23 0127)  sodium chloride 0.9 % bolus 1,000 mL (1,000 mLs Intravenous New Bag/Given 09/17/23 0240)  ipratropium-albuterol (DUONEB) 0.5-2.5 (3) MG/3ML nebulizer solution 3 mL (3 mLs Nebulization Given 09/17/23 0244)  cefTRIAXone  (ROCEPHIN) 1 g in sodium chloride 0.9 % 100 mL IVPB (1 g Intravenous New Bag/Given 09/17/23 0241)  azithromycin (ZITHROMAX) tablet 500 mg (500 mg Oral Given 09/17/23 0238)  chlorpheniramine-HYDROcodone (TUSSIONEX) 10-8 MG/5ML suspension 5 mL (5 mLs Oral Given 09/17/23 0242)  sodium chloride 0.9 % bolus 1,000 mL (1,000 mLs Intravenous Bolus 09/17/23 0350)  albuterol (PROVENTIL) (2.5 MG/3ML) 0.083% nebulizer solution 5 mg (5 mg Nebulization Given 09/17/23 0350)    ED Course/ Medical Decision Making/ A&P Clinical Course as of 09/17/23 0654  Mon Sep 17, 2023  0335 Recheck: coughing increased after nebulizer tx, productive, but patient reports she feels better.  O2 saturations stable.  [SU]    Clinical Course User Index [SU] Elpidio Anis, PA-C                                 Medical Decision Making This patient presents to the ED for concern of SOB, this involves an extensive number of treatment options, and is a complaint that carries with it a high risk of complications and morbidity.  The differential diagnosis includes PNA, PTX, PE, ACS, asthma exacerbation   Co morbidities that complicate the patient evaluation  Asthma in a continuous smoker,    Additional history obtained:  Additional history and/or information obtained from chart review, notable for No recent charts for review   Lab Tests:  I Ordered, and personally interpreted labs.  The pertinent results include:  WBC 10.9; no significant electrolyte abnormalities. COVID negative. Lactic acid 0.6    Imaging Studies ordered:  I ordered imaging studies including CXR Per radiologist interpretation: RLL atx vs PNA    Cardiac Monitoring:  The patient was maintained on a cardiac monitor.  I personally viewed and interpreted the cardiac monitored which showed an underlying rhythm of: Sinus tachycardia   Medicines ordered and prescription drug management:  I ordered medication including Albuterol  for  cough/wheezes Reevaluation of the patient after these  medicines showed that the patient worsened I have reviewed the patients home medicines and have made adjustments as needed    Problem List / ED Course:  Patient with URI illness including symptoms of fever, productive cough, wheezing, SOB Tachycardic and febrile (Tmax 101.8)  on arrival  CXR showing atx vs pna, favor pna given clinical picture IV rocephin and zithromax PO provided, IV fluids with some improvement in tachycardia She reports feeling worse after multiple nebulized treatments and is found to desaturate to 88% while at rest. Oxygen at 2L via [place] provided    Reevaluation:  After the interventions noted above, I reevaluated the patient and found that they have :stayed the same   Social Determinants of Health:  Smoker with h/o asthma   Disposition:  After consideration of the diagnostic results and the patients response to treatment, I feel that the patient would benefit from Admit.    Amount and/or Complexity of Data Reviewed Labs: ordered. Radiology: ordered.  Risk OTC drugs. Prescription drug management. Decision regarding hospitalization.           Final Clinical Impression(s) / ED Diagnoses Final diagnoses:  Pneumonia of right lower lobe due to infectious organism  Hypoxia    Rx / DC Orders ED Discharge Orders     None         Elpidio Anis, PA-C 09/17/23 1610

## 2023-09-17 NOTE — ED Notes (Signed)
CBG 135  

## 2023-09-17 NOTE — Progress Notes (Signed)
   09/17/23 1517  Assess: MEWS Score  Temp (!) 102.1 F (38.9 C)  BP 118/79  Pulse Rate (!) 105  Resp 20  Level of Consciousness Alert  Assess: MEWS Score  MEWS Temp 2  MEWS Systolic 0  MEWS Pulse 1  MEWS RR 0  MEWS LOC 0  MEWS Score 3  MEWS Score Color Yellow  Assess: if the MEWS score is Yellow or Red  Were vital signs accurate and taken at a resting state? Yes  Does the patient meet 2 or more of the SIRS criteria? No  Notify: Charge Nurse/RN  Name of Charge Nurse/RN Notified Megan, RN  Provider Notification  Provider Name/Title Kirby Crigler, Mir  Date Provider Notified 09/17/23  Time Provider Notified 1600  Method of Notification Page  Notification Reason Other (Comment) (fever, tachy)  Provider response No new orders  Date of Provider Response 09/17/23  Time of Provider Response 1600  Assess: SIRS CRITERIA  SIRS Temperature  1  SIRS Respirations  0  SIRS Pulse 1  SIRS WBC 0  SIRS Score Sum  2   Pt alert and oriented. C/o pain all over, fever 102.1 HR 105. Tylenol given, notified MD and charge nurse. No new order noted.

## 2023-09-17 NOTE — Progress Notes (Signed)
Pt refused her 16:00 breathing tx. No distress noted at this time.

## 2023-09-17 NOTE — Plan of Care (Signed)
  Problem: Education: Goal: Ability to describe self-care measures that may prevent or decrease complications (Diabetes Survival Skills Education) will improve Outcome: Progressing Goal: Individualized Educational Video(s) Outcome: Progressing   Problem: Activity: Goal: Risk for activity intolerance will decrease Outcome: Progressing   Problem: Pain Management: Goal: General experience of comfort will improve Outcome: Progressing   Problem: Safety: Goal: Ability to remain free from injury will improve Outcome: Progressing

## 2023-09-17 NOTE — H&P (Addendum)
History and Physical  [name]:[id number] DOB: May 11, 1966 DOA: 09/16/2023  PCP: [facility]   Chief Complaint: cough, fever   HPI: Dana Kennedy is a 57 y.o. female with medical history significant for ongoing tobacco abuse, SLE, GERD, asthma, diabetes being admitted to the hospital with community-acquired pneumonia.  States she has been having upper respiratory symptoms, cough, subjective fever for about the last 2.5 weeks.  Denies significant chest pain, has been having vomiting.  She takes metformin, and some lupus medication whose name she can currently not recall.  She does have a history of asthma, is prescribed a daily inhaler, but does not take it.  Review of Systems: Please see HPI for pertinent positives and negatives. A complete 10 system review of systems are otherwise negative.  Past Medical History:  Diagnosis Date   Anemia    Asthma    Chest pain    atypcial chest pain evaluation 07/10/18 Onalee Hua, Jeanie Cooks, MD), echo order (NL LVEF, wall motion 09/2018)   Chronic lower back pain    Diverticulitis    GERD (gastroesophageal reflux disease)    History of blood transfusion    "HgB low w/lupus flareup" (08/19/2018)   History of ITP 2014   NOT PROBLEM NOW   Rheumatoid arthritis (HCC)    "legs; all my bones" (08/19/2018)   SLE (systemic lupus erythematosus) (HCC)    Umbilical hernia    Past Surgical History:  Procedure Laterality Date   HERNIA REPAIR     PARTIAL COLECTOMY N/A 10/24/2018   Procedure: PARTIAL COLON RESECTION WITH ANASTOMOSIS; APPENDECTOMY  ERAS PATHWAY SPLENIC FLEXURE MOBILIZATION; UMBILICAL HERNIA REPAIR;  Surgeon: Kinsinger, De Blanch, MD;  Location: [facility] OR;  Service: General;  Laterality: N/A;   stomach ulcer surgery  2008   TONSILLECTOMY     TOTAL HIP ARTHROPLASTY  06/14/2012   Procedure: TOTAL HIP ARTHROPLASTY ANTERIOR APPROACH;  Surgeon: Kathryne Hitch, MD;  Location: [facility];  Service:  Orthopedics;  Laterality: Left;  Left Total Hip Arthroplasty, Anterior Approach C-Arm   TUBAL LIGATION     UMBILICAL HERNIA REPAIR     Social History:  reports that she has been smoking cigarettes. She has never used smokeless tobacco. She reports current alcohol use. She reports that she does not use drugs.  No Known Allergies  Family History  Problem Relation Age of Onset   Cancer Mother    Hypertension Mother    Heart attack Mother    Liver disease Father    Hypertension Brother      Prior to Admission medications   Medication Sig Start Date End Date Taking? Authorizing Provider  acetaminophen (TYLENOL) 325 MG tablet Take 2 tablets (650 mg total) by mouth every 6 (six) hours as needed for mild pain (or Fever >/= 101). 08/26/18   Shon Hale, MD  albuterol (VENTOLIN HFA) 108 (90 Base) MCG/ACT inhaler Inhale 2 puffs into the lungs every 4 (four) hours as needed for wheezing or shortness of breath. 06/13/22   Zenia Resides, MD  amoxicillin-clavulanate (AUGMENTIN) 875-125 MG tablet Take 1 tablet by mouth every 12 (twelve) hours. 08/07/22   Ellsworth Lennox, PA-C  amoxicillin-clavulanate (AUGMENTIN) 875-125 MG tablet Take 1 tablet by mouth every 12 (twelve) hours. 07/09/23   Lorre Nick, MD  dextromethorphan-guaiFENesin [facility] DM) 30-600 MG 12hr tablet Take 1 tablet by mouth 2 (two) times daily. 08/07/22   Ellsworth Lennox, PA-C  fluticasone [facility]) 50 MCG/ACT nasal spray Place 2 sprays into both nostrils daily.  06/28/20 07/28/20  Zigmund Daniel., MD  hydrochlorothiazide (MICROZIDE) 12.5 MG capsule Take 1 capsule (12.5 mg total) by mouth daily. 06/28/20 07/28/20  Zigmund Daniel., MD  ibuprofen (ADVIL) 600 MG tablet Take 1 tablet (600 mg total) by mouth every 6 (six) hours as needed. 08/07/22   Ellsworth Lennox, PA-C  ketorolac (TORADOL) 10 MG tablet Take 1 tablet (10 mg total) by mouth every 6 (six) hours as needed (pain). 06/13/22   Zenia Resides, MD  metroNIDAZOLE  (FLAGYL) 500 MG tablet Take 1 tablet (500 mg total) by mouth 2 (two) times daily. 06/19/22   Lamptey, Britta Mccreedy, MD  oxyCODONE-acetaminophen (PERCOCET/ROXICET) 5-325 MG tablet Take 1 tablet by mouth every 6 (six) hours as needed for severe pain (pain score 7-10). 07/09/23   Lorre Nick, MD  Spacer/Aero-Holding Chambers (AEROCHAMBER Z-STAT PLUS/MEDIUM) inhaler Use as instructed 06/28/20   Zigmund Daniel., MD  trimethoprim-polymyxin b [facility]) ophthalmic solution Place 1 drop into both eyes every 6 (six) hours. 08/07/22   Ellsworth Lennox, PA-C    Physical Exam: BP (!) 152/94   Pulse (!) 116   Temp (!) 100.7 F (38.2 C) (Oral)   Resp (!) 29   LMP 02/03/2018   SpO2 91%  General:  Alert, oriented, calm, in no acute distress, intermittent cough, resting comfortably saturating 94% on 2 L nasal cannula oxygen.  Able to speak in full sentences. Cardiovascular: Tachycardic and regular, no murmurs or rubs, no peripheral edema  Respiratory: clear to auscultation bilaterally breath sounds are distant.  Good bilateral equal air entry, without wheezing, tachypnea, or significant rhonchi. Abdomen: soft, nontender, nondistended, normal bowel tones heard  Skin: dry, no rashes  Musculoskeletal: no joint effusions, normal range of motion  Psychiatric: appropriate affect, normal speech  Neurologic: extraocular muscles intact, clear speech, moving all extremities with intact sensorium         Labs on Admission:  Basic Metabolic Panel: Recent Labs  Lab 09/16/23 2033  NA 138  K 3.5  CL 104  CO2 21*  GLUCOSE 121*  BUN 12  CREATININE 0.79  CALCIUM 9.3   Liver Function Tests: No results for input(s): "AST", "ALT", "ALKPHOS", "BILITOT", "PROT", "ALBUMIN" in the last 168 hours. No results for input(s): "LIPASE", "AMYLASE" in the last 168 hours. No results for input(s): "AMMONIA" in the last 168 hours. CBC: Recent Labs  Lab 09/16/23 2033  WBC 10.9*  HGB 13.5  HCT 40.8  MCV 83.3  PLT 351    Cardiac Enzymes: No results for input(s): "CKTOTAL", "CKMB", "CKMBINDEX", "TROPONINI" in the last 168 hours. BNP (last 3 results) No results for input(s): "BNP" in the last 8760 hours.  ProBNP (last 3 results) No results for input(s): "PROBNP" in the last 8760 hours.  CBG: No results for input(s): "GLUCAP" in the last 168 hours.  Radiological Exams on Admission: DG Chest 2 View Result Date: 09/16/2023 CLINICAL DATA:  One-week history of generalized body aches, chest pain, productive cough EXAM: CHEST - 2 VIEW COMPARISON:  Chest radiograph dated 04/15/2022 FINDINGS: Patient is rotated slightly to the right. Normal lung volumes. Right basilar patchy opacity. No pleural effusion or pneumothorax. The heart size and mediastinal contours are within normal limits. No acute osseous abnormality. IMPRESSION: Right basilar patchy opacity, which may represent atelectasis or pneumonia. Electronically Signed   By: Agustin Cree M.D.   On: 09/16/2023 20:45   Assessment/Plan SHIRITA ESSON is a 57 y.o. female with medical history significant for ongoing tobacco abuse, SLE,  GERD, asthma, diabetes being admitted to the hospital with sepsis due to community-acquired pneumonia.    Sepsis-meeting criteria with tachycardia, fever, leukocytosis, source is community-acquired pneumonia.  No evidence of endorgan dysfunction, patient is hemodynamically stable with normal lactate. -Observation admission -Monitor on telemetry -Follow-up blood cultures, not obtained prior to antibiotics -Treat community-acquired pneumonia as below  Acute hypoxic respiratory failure-likely due to community-acquired pneumonia, versus asthma exacerbation.  Patient is currently not in respiratory distress, but remains tachycardic.  Will rule out PE, D-dimer will not be helpful in the setting of acute infection. -Stat CT angio chest --> negative for PE  Community-acquired pneumonia-with cough, fever, leukocytosis, and chest x-ray  with possible right basilar patchy opacity as above.  Could potentially be postviral pneumonia. -Empiric IV azithromycin and IV Rocephin -Check viral respiratory panel, contact and droplet precautions in the meantime  Type 2 diabetes-not insulin-dependent -Check hemoglobin A1c -Carb modified diet -Moderate sliding scale  History of asthma-though she has increased cough with sputum production, no report of wheezing, patient was not given steroids in the emergency department and is currently not wheezing.  Doubt exacerbation. -Scheduled DuoNebs -Albuterol as needed  DVT prophylaxis: Lovenox     Code Status: Full Code  Consults called: None  Admission status: Observation   Time spent: 49 minutes  Akili Cuda Sharlette Dense MD [facility] Pager [phone]  If 7PM-7AM, please contact night-coverage [email or web address] Password [facility]  09/17/2023, 7:32 AM

## 2023-09-18 ENCOUNTER — Encounter (HOSPITAL_COMMUNITY): Payer: Self-pay | Admitting: Internal Medicine

## 2023-09-18 DIAGNOSIS — J189 Pneumonia, unspecified organism: Secondary | ICD-10-CM

## 2023-09-18 LAB — BLOOD CULTURE ID PANEL (REFLEXED) - BCID2

## 2023-09-18 LAB — CBC
HCT: 38.5 % (ref 36.0–46.0)
Hemoglobin: 12.3 g/dL (ref 12.0–15.0)
MCH: 27.5 pg (ref 26.0–34.0)
MCHC: 31.9 g/dL (ref 30.0–36.0)
MCV: 86.1 fL (ref 80.0–100.0)
Platelets: 299 10*3/uL (ref 150–400)
RBC: 4.47 MIL/uL (ref 3.87–5.11)
RDW: 14.6 % (ref 11.5–15.5)
WBC: 11.4 10*3/uL — ABNORMAL HIGH (ref 4.0–10.5)
nRBC: 0 % (ref 0.0–0.2)

## 2023-09-18 LAB — BASIC METABOLIC PANEL
Anion gap: 9 (ref 5–15)
BUN: 6 mg/dL (ref 6–20)
CO2: 24 mmol/L (ref 22–32)
Calcium: 8.4 mg/dL — ABNORMAL LOW (ref 8.9–10.3)
Chloride: 103 mmol/L (ref 98–111)
Creatinine, Ser: 0.6 mg/dL (ref 0.44–1.00)
GFR, Estimated: >60 mL/min (ref >60)
Glucose, Bld: 104 mg/dL — ABNORMAL HIGH (ref 70–99)
Potassium: 3.1 mmol/L — ABNORMAL LOW (ref 3.5–5.1)
Sodium: 136 mmol/L (ref 135–145)

## 2023-09-18 LAB — GLUCOSE, CAPILLARY
Glucose-Capillary: 120 mg/dL — ABNORMAL HIGH (ref 70–99)
Glucose-Capillary: 110 mg/dL — ABNORMAL HIGH (ref 70–99)
Glucose-Capillary: 196 mg/dL — ABNORMAL HIGH (ref 70–99)
Glucose-Capillary: 203 mg/dL — ABNORMAL HIGH (ref 70–99)

## 2023-09-18 LAB — HEMOGLOBIN A1C
Hgb A1c MFr Bld: 6.3 % — ABNORMAL HIGH (ref 4.8–5.6)
Mean Plasma Glucose: 134 mg/dL

## 2023-09-18 MED ORDER — BUDESONIDE 0.25 MG/2ML IN SUSP
0.2500 mg | Freq: Two times a day (BID) | RESPIRATORY_TRACT | Status: DC
  Administered 2023-09-18 – 2023-09-21 (×6): 0.25 mg via RESPIRATORY_TRACT
  Filled 2023-09-18 (×6): qty 2

## 2023-09-18 MED ORDER — GUAIFENESIN ER 600 MG PO TB12
1200.0000 mg | ORAL_TABLET | Freq: Two times a day (BID) | ORAL | Status: DC
Start: 2023-09-18 — End: 2023-09-21
  Administered 2023-09-18 – 2023-09-21 (×7): 1200 mg via ORAL
  Filled 2023-09-18 (×7): qty 2

## 2023-09-18 MED ORDER — METHYLPREDNISOLONE SODIUM SUCC 40 MG IJ SOLR
40.0000 mg | Freq: Two times a day (BID) | INTRAMUSCULAR | Status: DC
  Administered 2023-09-18 – 2023-09-21 (×7): 40 mg via INTRAVENOUS
  Filled 2023-09-18 (×7): qty 1

## 2023-09-18 MED ORDER — VANCOMYCIN HCL 1.5 G IV SOLR
1500.0000 mg | Freq: Every day | INTRAVENOUS | Status: DC
  Administered 2023-09-18: 1500 mg via INTRAVENOUS
  Filled 2023-09-18 (×2): qty 30

## 2023-09-18 MED ORDER — HYDROCOD POLI-CHLORPHE POLI ER 10-8 MG/5ML PO SUER
5.0000 mL | Freq: Two times a day (BID) | ORAL | Status: DC | PRN
  Administered 2023-09-18 – 2023-09-20 (×3): 5 mL via ORAL
  Filled 2023-09-18 (×3): qty 5

## 2023-09-18 MED ORDER — IPRATROPIUM-ALBUTEROL 0.5-2.5 (3) MG/3ML IN SOLN
3.0000 mL | Freq: Two times a day (BID) | RESPIRATORY_TRACT | Status: DC
  Administered 2023-09-19 – 2023-09-21 (×4): 3 mL via RESPIRATORY_TRACT
  Filled 2023-09-18 (×5): qty 3

## 2023-09-18 MED ORDER — POTASSIUM CHLORIDE CRYS ER 20 MEQ PO TBCR
40.0000 meq | EXTENDED_RELEASE_TABLET | Freq: Once | ORAL | Status: AC
  Administered 2023-09-18: 40 meq via ORAL
  Filled 2023-09-18: qty 2

## 2023-09-18 MED ORDER — FLUTICASONE PROPIONATE 50 MCG/ACT NA SUSP
2.0000 | Freq: Every day | NASAL | Status: DC
  Administered 2023-09-18 – 2023-09-21 (×4): 2 via NASAL
  Filled 2023-09-18: qty 16

## 2023-09-18 MED ORDER — ARFORMOTEROL TARTRATE 15 MCG/2ML IN NEBU
15.0000 ug | INHALATION_SOLUTION | Freq: Two times a day (BID) | RESPIRATORY_TRACT | Status: DC
  Administered 2023-09-18 – 2023-09-21 (×6): 15 ug via RESPIRATORY_TRACT
  Filled 2023-09-18 (×6): qty 2

## 2023-09-18 NOTE — Plan of Care (Signed)
  Problem: Education: Goal: Ability to describe self-care measures that may prevent or decrease complications (Diabetes Survival Skills Education) will improve Outcome: Progressing   Problem: Coping: Goal: Ability to adjust to condition or change in health will improve Outcome: Progressing   Problem: Clinical Measurements: Goal: Respiratory complications will improve Outcome: Progressing

## 2023-09-18 NOTE — Progress Notes (Signed)
 PROGRESS NOTE    STORY CONTI  FMW:[id number] DOB: [birth year] DOA: 09/16/2023 PCP: [facility]   Brief Narrative: 57 year old with past medical history significant for tobacco abuse, SLE, GERD, asthma, diabetes presents with cough, fever for 2-1/2-week 2 pneumonia and RSV positive.  Assessment & Plan:   Principal Problem:   CAP (community acquired pneumonia)   1-Sepsis secondary to pneumonia and RSV: POA -Patient presents with tachycardia, fever, leukocytosis, source of infection pneumonia RSV. -Continue with IV ceftriaxone  and azithromycin  -Negative for PE patchy consolidation in the right upper lobe associated with tree in bud opacities in the right middle and lower lobes suspicious for bronchopneumonia Blood cultures 2 out of 2 positive for staph epi.  Due to her immunocompromise status we will proceed with adding vancomycin .  Will repeat blood cultures prior to the initiation of vancomycin   Acute hypoxic respiratory failure secondary to community-acquired pneumonia -CT angio chest: Negative for PE.Patchy consolidation in the right upper lobe with associated tree-in-bud opacities in the right middle and lower lobes, suspicious for bronchopneumonia. Right hilar lymphadenopathy is favored reactive. -Patient continues to be very short of breath, she has bilateral wheezing. -Start IV steroid, Pulmicort  and Brovana  -Continue with DuoNeb -Continue with guaifenesin  Add Tussionex twice daily as needed  Diabetes type 2: -A16: 6.3 -Continue with a sliding scale insulin   Asthma Exacerbation -Continue DuoNeb, add IV Solu-Medrol  -Scheduled nebulizer, Pulmicort    Hypokalemia:  Replete orally  History of lupus   Estimated body mass index is 33.97 kg/m as calculated from the following:   Height as of 07/09/23: 5' 5 (1.651 m).   Weight as of this encounter: 92.6 kg.   DVT prophylaxis: Lovenox  Code Status: Full code Family Communication:  Discussed with patient Disposition Plan:  Status is: Inpatient Remains inpatient appropriate because: Management of pneumonia    Consultants:  none  Procedures:  None  Antimicrobials:    Subjective: She is not feeling better, she reported worsening shortness of breath, cough, bilateral wheezing.  At times she feels better over time worse  Objective: Vitals:   09/18/23 0055 09/18/23 0416 09/18/23 0430 09/18/23 0434  BP: 133/79 (!) 148/80    Pulse: (!) 101 (!) 103 99 (!) 104  Resp: 18 18    Temp: 99.3 F (37.4 C) (!) 102.3 F (39.1 C)    TempSrc: Oral Oral    SpO2: 95% 94% 96% 96%  Weight:        Intake/Output Summary (Last 24 hours) at 09/18/2023 9271 Last data filed at 09/18/2023 0600 Gross per 24 hour  Intake 340 ml  Output --  Net 340 ml   Filed Weights   09/17/23 1700  Weight: 92.6 kg    Examination:  General exam: Appears calm and comfortable  Respiratory system: Bilateral rhonchorous and wheezing respiratory effort normal. Cardiovascular system: S1 & S2 heard, RRR. No JVD, murmurs, rubs, gallops or clicks. No pedal edema. Gastrointestinal system: Abdomen is nondistended, soft and nontender. No organomegaly or masses felt. Normal bowel sounds heard. Central nervous system: Alert and oriented. No focal neurological deficits. Extremities: Symmetric 5 x 5 power.   Data Reviewed: I have personally reviewed following labs and imaging studies  CBC: Recent Labs  Lab 09/16/23 2033 09/18/23 0418  WBC 10.9* 11.4*  HGB 13.5 12.3  HCT 40.8 38.5  MCV 83.3 86.1  PLT 351 299   Basic Metabolic Panel: Recent Labs  Lab 09/16/23 2033 09/18/23 0418  NA 138 136  K 3.5 3.1*  CL 104 103  CO2 21* 24  GLUCOSE 121* 104*  BUN 12 6  CREATININE 0.79 0.60  CALCIUM 9.3 8.4*   GFR: Estimated Creatinine Clearance: 88.3 mL/min (by C-G formula based on SCr of 0.6 mg/dL). Liver Function Tests: No results for input(s): AST, ALT, ALKPHOS, BILITOT, PROT,  ALBUMIN in the last 168 hours. No results for input(s): LIPASE, AMYLASE in the last 168 hours. No results for input(s): AMMONIA in the last 168 hours. Coagulation Profile: No results for input(s): INR, PROTIME in the last 168 hours. Cardiac Enzymes: No results for input(s): CKTOTAL, CKMB, CKMBINDEX, TROPONINI in the last 168 hours. BNP (last 3 results) No results for input(s): PROBNP in the last 8760 hours. HbA1C: Recent Labs    09/17/23 1520  HGBA1C 6.3*   CBG: Recent Labs  Lab 09/17/23 0803 09/17/23 1207 09/17/23 1632 09/17/23 2102  GLUCAP 135* 133* 116* 130*   Lipid Profile: No results for input(s): CHOL, HDL, LDLCALC, TRIG, CHOLHDL, LDLDIRECT in the last 72 hours. Thyroid Function Tests: No results for input(s): TSH, T4TOTAL, FREET4, T3FREE, THYROIDAB in the last 72 hours. Anemia Panel: No results for input(s): VITAMINB12, FOLATE, FERRITIN, TIBC, IRON , RETICCTPCT in the last 72 hours. Sepsis Labs: Recent Labs  Lab 09/17/23 0243  LATICACIDVEN 0.6    Recent Results (from the past 240 hours)  SARS Coronavirus 2 by RT PCR (hospital order, performed in [facility] hospital lab) *cepheid single result test* Anterior Nasal Swab     Status: None   Collection Time: 09/16/23  7:45 PM   Specimen: Anterior Nasal Swab  Result Value Ref Range Status   SARS Coronavirus 2 by RT PCR NEGATIVE NEGATIVE Final    Comment: (NOTE) SARS-CoV-2 target nucleic acids are NOT DETECTED.  The SARS-CoV-2 RNA is generally detectable in upper and lower respiratory specimens during the acute phase of infection. The lowest concentration of SARS-CoV-2 viral copies this assay can detect is 250 copies / mL. A negative result does not preclude SARS-CoV-2 infection and should not be used as the sole basis for treatment or other patient management decisions.  A negative result may occur with improper specimen collection / handling, submission of  specimen other than nasopharyngeal swab, presence of viral mutation(s) within the areas targeted by this assay, and inadequate number of viral copies (<250 copies / mL). A negative result must be combined with clinical observations, patient history, and epidemiological information.  Fact Sheet for Patients:   roadlaptop.co.za  Fact Sheet for Healthcare Providers: http://kim-miller.com/  This test is not yet approved or  cleared by the [place]  FDA and has been authorized for detection and/or diagnosis of SARS-CoV-2 by FDA under an Emergency Use Authorization (EUA).  This EUA will remain in effect (meaning this test can be used) for the duration of the COVID-19 declaration under Section 564(b)(1) of the Act, 21 U.S.C. section 360bbb-3(b)(1), unless the authorization is terminated or revoked sooner.  Performed at [facility], [address], [place], [place] [address]   Respiratory (~20 pathogens) panel by PCR     Status: Abnormal   Collection Time: 09/16/23  7:45 PM   Specimen: Nasopharyngeal Swab; Respiratory  Result Value Ref Range Status   Adenovirus NOT DETECTED NOT DETECTED Final   Coronavirus 229E NOT DETECTED NOT DETECTED Final    Comment: (NOTE) The Coronavirus on the Respiratory Panel, DOES NOT test for the novel  Coronavirus (2019 nCoV)    Coronavirus HKU1 NOT DETECTED NOT DETECTED Final   Coronavirus NL63 NOT DETECTED NOT DETECTED Final  Coronavirus OC43 NOT DETECTED NOT DETECTED Final   Metapneumovirus NOT DETECTED NOT DETECTED Final   Rhinovirus / Enterovirus NOT DETECTED NOT DETECTED Final   Influenza A NOT DETECTED NOT DETECTED Final   Influenza B NOT DETECTED NOT DETECTED Final   Parainfluenza Virus 1 NOT DETECTED NOT DETECTED Final   Parainfluenza Virus 2 NOT DETECTED NOT DETECTED Final   Parainfluenza Virus 3 NOT DETECTED NOT DETECTED Final   Parainfluenza Virus 4 NOT DETECTED NOT  DETECTED Final   Respiratory Syncytial Virus DETECTED (A) NOT DETECTED Final   Bordetella pertussis NOT DETECTED NOT DETECTED Final   Bordetella Parapertussis NOT DETECTED NOT DETECTED Final   Chlamydophila pneumoniae NOT DETECTED NOT DETECTED Final   Mycoplasma pneumoniae NOT DETECTED NOT DETECTED Final    Comment: Performed at [facility], [address], [place], [place] [address]  Culture, blood (Routine X 2) w Reflex to ID Panel     Status: None (Preliminary result)   Collection Time: 09/17/23 11:36 AM   Specimen: BLOOD LEFT FOREARM  Result Value Ref Range Status   Specimen Description   Final    BLOOD LEFT FOREARM Performed at [facility], [address] [place], [place] [address]    Special Requests   Final    BOTTLES DRAWN AEROBIC AND ANAEROBIC Blood Culture results may not be optimal due to an inadequate volume of blood received in culture bottles Performed at [facility], [address] [place], [place] [address]    Culture PENDING  Incomplete   Report Status PENDING  Incomplete  Culture, blood (Routine X 2) w Reflex to ID Panel     Status: None (Preliminary result)   Collection Time: 09/17/23 11:36 AM   Specimen: BLOOD LEFT FOREARM  Result Value Ref Range Status   Specimen Description   Final    BLOOD LEFT FOREARM Performed at [facility], [address], [place], [place] [address]    Special Requests   Final    BOTTLES DRAWN AEROBIC AND ANAEROBIC Blood Culture results may not be optimal due to an inadequate volume of blood received in culture bottles Performed at [facility], [address], [place], [place] [address]    Culture PENDING  Incomplete   Report Status PENDING  Incomplete         Radiology Studies: CT Angio Chest Pulmonary Embolism (PE) W or WO Contrast Result Date: 09/17/2023 CLINICAL DATA:  Pulmonary embolism (PE) suspected, high prob. Shortness of breath. Productive cough. EXAM: CT  ANGIOGRAPHY CHEST WITH CONTRAST TECHNIQUE: Multidetector CT imaging of the chest was performed using the standard protocol during bolus administration of intravenous contrast. Multiplanar CT image reconstructions and MIPs were obtained to evaluate the vascular anatomy. RADIATION DOSE REDUCTION: This exam was performed according to the departmental dose-optimization program which includes automated exposure control, adjustment of the mA and/or kV according to patient size and/or use of iterative reconstruction technique. CONTRAST:  75mL OMNIPAQUE  IOHEXOL  350 MG/ML SOLN COMPARISON:  CTA chest 11/01/2017. FINDINGS: Cardiovascular: Satisfactory opacification of the pulmonary arteries to the segmental level. No evidence of pulmonary embolism. Normal heart size. No pericardial effusion. Mediastinum/Nodes: Small hiatal hernia. Right hilar lymphadenopathy is favored reactive. Lungs/Pleura: Patchy consolidation in the right upper lobe with associated tree-in-bud opacities in the right middle and lower lobes, suspicious for bronchopneumonia. No pleural effusion or pneumothorax. Upper Abdomen: No acute abnormality. Musculoskeletal: No chest wall abnormality. No acute or significant osseous findings. Review of the MIP  images confirms the above findings. IMPRESSION: 1. No evidence of pulmonary embolism. 2. Patchy consolidation in the right upper lobe with associated tree-in-bud opacities in the right middle and lower lobes, suspicious for bronchopneumonia. 3. Right hilar lymphadenopathy is favored reactive. 4. Small hiatal hernia. Electronically Signed   By: Ryan Chess M.D.   On: 09/17/2023 08:25   DG Chest 2 View Result Date: 09/16/2023 CLINICAL DATA:  One-week history of generalized body aches, chest pain, productive cough EXAM: CHEST - 2 VIEW COMPARISON:  Chest radiograph dated 04/15/2022 FINDINGS: Patient is rotated slightly to the right. Normal lung volumes. Right basilar patchy opacity. No pleural effusion or  pneumothorax. The heart size and mediastinal contours are within normal limits. No acute osseous abnormality. IMPRESSION: Right basilar patchy opacity, which may represent atelectasis or pneumonia. Electronically Signed   By: Limin  Xu M.D.   On: 09/16/2023 20:45        Scheduled Meds:  azithromycin   500 mg Oral Daily   enoxaparin  (LOVENOX ) injection  50 mg Subcutaneous Q24H   insulin  aspart  0-15 Units Subcutaneous TID WC   insulin  aspart  0-5 Units Subcutaneous QHS   ipratropium-albuterol   3 mL Nebulization Q4H   lidocaine   1 patch Transdermal Once   potassium chloride   40 mEq Oral Once   Continuous Infusions:  cefTRIAXone  (ROCEPHIN )  IV Stopped (09/18/23 0302)     LOS: 1 day    Time spent: 35 Minutes    Dayvian Blixt A Danielys Madry, MD [facility]   If 7PM-7AM, please contact night-coverage [email or web address]  09/18/2023, 7:28 AM

## 2023-09-18 NOTE — Plan of Care (Signed)
  Problem: Coping: Goal: Ability to adjust to condition or change in health will improve Outcome: Progressing   Problem: Fluid Volume: Goal: Ability to maintain a balanced intake and output will improve Outcome: Progressing   Problem: Health Behavior/Discharge Planning: Goal: Ability to manage health-related needs will improve Outcome: Progressing   Problem: Metabolic: Goal: Ability to maintain appropriate glucose levels will improve Outcome: Progressing   Problem: Nutritional: Goal: Maintenance of adequate nutrition will improve Outcome: Progressing Goal: Progress toward achieving an optimal weight will improve Outcome: Progressing   Problem: Skin Integrity: Goal: Risk for impaired skin integrity will decrease Outcome: Progressing   Problem: Tissue Perfusion: Goal: Adequacy of tissue perfusion will improve Outcome: Progressing   Problem: Education: Goal: Knowledge of General Education information will improve Description: Including pain rating scale, medication(s)/side effects and non-pharmacologic comfort measures Outcome: Progressing   Problem: Health Behavior/Discharge Planning: Goal: Ability to manage health-related needs will improve Outcome: Progressing   Problem: Clinical Measurements: Goal: Ability to maintain clinical measurements within normal limits will improve Outcome: Progressing Goal: Will remain free from infection Outcome: Progressing Goal: Diagnostic test results will improve Outcome: Progressing Goal: Respiratory complications will improve Outcome: Progressing Goal: Cardiovascular complication will be avoided Outcome: Progressing   Problem: Activity: Goal: Risk for activity intolerance will decrease Outcome: Progressing   Problem: Nutrition: Goal: Adequate nutrition will be maintained Outcome: Progressing

## 2023-09-18 NOTE — Progress Notes (Addendum)
 PHARMACY - PHYSICIAN COMMUNICATION CRITICAL VALUE ALERT - BLOOD CULTURE IDENTIFICATION (BCID)  Dana Kennedy is an 57 y.o. female who presented to [facility] on 09/16/2023 with a chief complaint of cough and fever. She was started on ceftriaxone  and azithromycin  for PNA. Two of four blood culture bottles (collected at the same site) from 12/30 resulted back with GPC in clusters (BCID=  staph epi, mecA/C+)   Name of physician (or Provider) Contacted: Dr. Madelyne  Current antibiotics: ceftriaxone  and azithromycin   Changes to prescribed antibiotics recommended:  - Per Dr. Regalado, start vancomycin  after repeat blood cultures  - start vancomycin  2000 mg IV x1, then 1500 mg IV q24h for est AUC 512 after repeat blood cx  Results for orders placed or performed during the hospital encounter of 09/16/23  Blood Culture ID Panel (Reflexed) (Collected: 09/17/2023 11:36 AM)  Result Value Ref Range   Enterococcus faecalis NOT DETECTED NOT DETECTED   Enterococcus Faecium NOT DETECTED NOT DETECTED   Listeria monocytogenes NOT DETECTED NOT DETECTED   Staphylococcus species DETECTED (A) NOT DETECTED   Staphylococcus aureus (BCID) NOT DETECTED NOT DETECTED   Staphylococcus epidermidis DETECTED (A) NOT DETECTED   Staphylococcus lugdunensis NOT DETECTED NOT DETECTED   Streptococcus species NOT DETECTED NOT DETECTED   Streptococcus agalactiae NOT DETECTED NOT DETECTED   Streptococcus pneumoniae NOT DETECTED NOT DETECTED   Streptococcus pyogenes NOT DETECTED NOT DETECTED   A.calcoaceticus-baumannii NOT DETECTED NOT DETECTED   Bacteroides fragilis NOT DETECTED NOT DETECTED   Enterobacterales NOT DETECTED NOT DETECTED   Enterobacter cloacae complex NOT DETECTED NOT DETECTED   Escherichia coli NOT DETECTED NOT DETECTED   Klebsiella aerogenes NOT DETECTED NOT DETECTED   Klebsiella oxytoca NOT DETECTED NOT DETECTED   Klebsiella pneumoniae NOT DETECTED NOT DETECTED   Proteus species NOT DETECTED  NOT DETECTED   Salmonella species NOT DETECTED NOT DETECTED   Serratia marcescens NOT DETECTED NOT DETECTED   Haemophilus influenzae NOT DETECTED NOT DETECTED   Neisseria meningitidis NOT DETECTED NOT DETECTED   Pseudomonas aeruginosa NOT DETECTED NOT DETECTED   Stenotrophomonas maltophilia NOT DETECTED NOT DETECTED   Candida albicans NOT DETECTED NOT DETECTED   Candida auris NOT DETECTED NOT DETECTED   Candida glabrata NOT DETECTED NOT DETECTED   Candida krusei NOT DETECTED NOT DETECTED   Candida parapsilosis NOT DETECTED NOT DETECTED   Candida tropicalis NOT DETECTED NOT DETECTED   Cryptococcus neoformans/gattii NOT DETECTED NOT DETECTED   Methicillin resistance mecA/C DETECTED (A) NOT DETECTED    Jah Alarid P 09/18/2023  2:25 PM

## 2023-09-19 DIAGNOSIS — F17210 Nicotine dependence, cigarettes, uncomplicated: Secondary | ICD-10-CM | POA: Diagnosis not present

## 2023-09-19 DIAGNOSIS — J189 Pneumonia, unspecified organism: Secondary | ICD-10-CM | POA: Diagnosis not present

## 2023-09-19 LAB — GLUCOSE, CAPILLARY
Glucose-Capillary: 150 mg/dL — ABNORMAL HIGH (ref 70–99)
Glucose-Capillary: 133 mg/dL — ABNORMAL HIGH (ref 70–99)
Glucose-Capillary: 138 mg/dL — ABNORMAL HIGH (ref 70–99)
Glucose-Capillary: 189 mg/dL — ABNORMAL HIGH (ref 70–99)

## 2023-09-19 LAB — BASIC METABOLIC PANEL
Anion gap: 10 (ref 5–15)
BUN: 8 mg/dL (ref 6–20)
CO2: 23 mmol/L (ref 22–32)
Calcium: 9.4 mg/dL (ref 8.9–10.3)
Chloride: 106 mmol/L (ref 98–111)
Creatinine, Ser: 0.5 mg/dL (ref 0.44–1.00)
GFR, Estimated: >60 mL/min (ref >60)
Glucose, Bld: 152 mg/dL — ABNORMAL HIGH (ref 70–99)
Potassium: 3.8 mmol/L (ref 3.5–5.1)
Sodium: 139 mmol/L (ref 135–145)

## 2023-09-19 LAB — CBC
HCT: 40.3 % (ref 36.0–46.0)
Hemoglobin: 12.8 g/dL (ref 12.0–15.0)
MCH: 27.1 pg (ref 26.0–34.0)
MCHC: 31.8 g/dL (ref 30.0–36.0)
MCV: 85.2 fL (ref 80.0–100.0)
Platelets: 349 10*3/uL (ref 150–400)
RBC: 4.73 MIL/uL (ref 3.87–5.11)
RDW: 14.1 % (ref 11.5–15.5)
WBC: 10.6 10*3/uL — ABNORMAL HIGH (ref 4.0–10.5)
nRBC: 0 % (ref 0.0–0.2)

## 2023-09-19 MED ORDER — LINEZOLID 600 MG PO TABS
600.0000 mg | ORAL_TABLET | Freq: Two times a day (BID) | ORAL | Status: DC
  Administered 2023-09-19 – 2023-09-21 (×4): 600 mg via ORAL
  Filled 2023-09-19 (×4): qty 1

## 2023-09-19 MED ORDER — MENTHOL 3 MG MT LOZG
1.0000 | LOZENGE | OROMUCOSAL | Status: DC | PRN
  Administered 2023-09-20: 3 mg via ORAL
  Filled 2023-09-19: qty 9

## 2023-09-19 MED ORDER — SODIUM CHLORIDE 3 % IN NEBU
4.0000 mL | INHALATION_SOLUTION | Freq: Two times a day (BID) | RESPIRATORY_TRACT | Status: AC
  Administered 2023-09-19 – 2023-09-20 (×3): 4 mL via RESPIRATORY_TRACT
  Filled 2023-09-19 (×3): qty 4

## 2023-09-19 NOTE — Consult Note (Signed)
 [facility]       Reason for Consult:pneumonia    Referring Physician: Dr. Madelyne  Principal Problem:   CAP (community acquired pneumonia) Active Problems:   Lupus (systemic lupus erythematosus) (HCC)   Asthma   Asthma exacerbation   Hypokalemia    arformoterol   15 mcg Nebulization BID   azithromycin   500 mg Oral Daily   budesonide  (PULMICORT ) nebulizer solution  0.25 mg Nebulization BID   enoxaparin  (LOVENOX ) injection  50 mg Subcutaneous Q24H   fluticasone   2 spray Each Nare Daily   guaiFENesin   1,200 mg Oral BID   insulin  aspart  0-15 Units Subcutaneous TID WC   insulin  aspart  0-5 Units Subcutaneous QHS   ipratropium-albuterol   3 mL Nebulization BID   methylPREDNISolone  (SOLU-MEDROL ) injection  40 mg Intravenous Q12H   sodium chloride  HYPERTONIC  4 mL Nebulization BID    Recommendations: Will change to linezolid  600 mg twice a day for 5 days Will stop other antibiotics  Assessment: She has pneumonia based on CT findings, hypoxemia, fever which seems to be resolving on empiric treatment.  She did get some vancomycin  as well based on the positive blood cultures.  RSV certainly could be the cause of her pneumonia however with the positive blood cultures I will have her continue with coverage for typical bacteria as well as staph epi.  Can treat an additional 5 days.  HPI: Dana Kennedy is a 58 y.o. female with a history of SLE on chronic prednisone , tobaco abuse, asthma comes in with fever and RSV positive.  Underwent a CT scan c/w patchy consolidation of the right upper lobe with tree-in-bud opacity in the right middle and lower lobes.  She has been on oxygen however has weaned off today.  She also had blood cultures on admission which are now positive for Staph epidermidis.  Both sets of blood cultures were drawn in the same arm, looks like the same time as well so unclear if they were separate samples.  Fever curve has trended down  with a peak of 102, white count has been mildly elevated at up to a peak of 11,400.  She feels better since admission though tired.  She continues to cough up productive sputum.  No blood.  Review of Systems:  Constitutional: negative for fevers and chills All other systems reviewed and are negative    Past Medical History:  Diagnosis Date   Anemia    Asthma    Chest pain    atypcial chest pain evaluation 07/10/18 Joycelyn, Hester, MD), echo order (NL LVEF, wall motion 09/2018)   Chronic lower back pain    Diverticulitis    GERD (gastroesophageal reflux disease)    History of blood transfusion    HgB low w/lupus flareup (08/19/2018)   History of ITP 2014   NOT PROBLEM NOW   Rheumatoid arthritis (HCC)    legs; all my bones (08/19/2018)   SLE (systemic lupus erythematosus) (HCC)    Umbilical hernia     Social History   Tobacco Use   Smoking status: Every Day    Current packs/day: 0.00    Types: Cigarettes    Last attempt to quit: 09/01/2018    Years since quitting: 5.0   Smokeless tobacco: Never  Vaping Use   Vaping status: Never Used  Substance Use Topics   Alcohol use: Yes    Comment: 08/19/2018 glass of wine a couple times/year   Drug use: No  Family History  Problem Relation Age of Onset   Cancer Mother    Hypertension Mother    Heart attack Mother    Liver disease Father    Hypertension Brother     No Known Allergies  Physical Exam: Constitutional: in no apparent distress  Vitals:   09/19/23 1304 09/19/23 1353  BP: (!) 141/84   Pulse: 86   Resp: 20   Temp: 98.6 F (37 C)   SpO2: 91% 93%   EYES: anicteric Respiratory: normal respiratory effort  Lab Results  Component Value Date   WBC 10.6 (H) 09/19/2023   HGB 12.8 09/19/2023   HCT 40.3 09/19/2023   MCV 85.2 09/19/2023   PLT 349 09/19/2023    Lab Results  Component Value Date   CREATININE 0.50 09/19/2023   BUN 8 09/19/2023   NA 139 09/19/2023   K 3.8 09/19/2023   CL 106  09/19/2023   CO2 23 09/19/2023    Lab Results  Component Value Date   ALT 13 06/27/2020   AST 17 06/27/2020   ALKPHOS 74 06/27/2020     Microbiology: Recent Results (from the past 240 hours)  SARS Coronavirus 2 by RT PCR (hospital order, performed in [facility] hospital lab) *cepheid single result test* Anterior Nasal Swab     Status: None   Collection Time: 09/16/23  7:45 PM   Specimen: Anterior Nasal Swab  Result Value Ref Range Status   SARS Coronavirus 2 by RT PCR NEGATIVE NEGATIVE Final    Comment: (NOTE) SARS-CoV-2 target nucleic acids are NOT DETECTED.  The SARS-CoV-2 RNA is generally detectable in upper and lower respiratory specimens during the acute phase of infection. The lowest concentration of SARS-CoV-2 viral copies this assay can detect is 250 copies / mL. A negative result does not preclude SARS-CoV-2 infection and should not be used as the sole basis for treatment or other patient management decisions.  A negative result may occur with improper specimen collection / handling, submission of specimen other than nasopharyngeal swab, presence of viral mutation(s) within the areas targeted by this assay, and inadequate number of viral copies (<250 copies / mL). A negative result must be combined with clinical observations, patient history, and epidemiological information.  Fact Sheet for Patients:   roadlaptop.co.za  Fact Sheet for Healthcare Providers: http://kim-miller.com/  This test is not yet approved or  cleared by the [place]  FDA and has been authorized for detection and/or diagnosis of SARS-CoV-2 by FDA under an Emergency Use Authorization (EUA).  This EUA will remain in effect (meaning this test can be used) for the duration of the COVID-19 declaration under Section 564(b)(1) of the Act, 21 U.S.C. section 360bbb-3(b)(1), unless the authorization is terminated or revoked sooner.  Performed at [facility], [address] [place], [place] [address]   Respiratory (~20 pathogens) panel by PCR     Status: Abnormal   Collection Time: 09/16/23  7:45 PM   Specimen: Nasopharyngeal Swab; Respiratory  Result Value Ref Range Status   Adenovirus NOT DETECTED NOT DETECTED Final   Coronavirus 229E NOT DETECTED NOT DETECTED Final    Comment: (NOTE) The Coronavirus on the Respiratory Panel, DOES NOT test for the novel  Coronavirus (2019 nCoV)    Coronavirus HKU1 NOT DETECTED NOT DETECTED Final   Coronavirus NL63 NOT DETECTED NOT DETECTED Final   Coronavirus OC43 NOT DETECTED NOT DETECTED Final   Metapneumovirus NOT DETECTED NOT DETECTED Final   Rhinovirus / Enterovirus NOT DETECTED NOT DETECTED  Final   Influenza A NOT DETECTED NOT DETECTED Final   Influenza B NOT DETECTED NOT DETECTED Final   Parainfluenza Virus 1 NOT DETECTED NOT DETECTED Final   Parainfluenza Virus 2 NOT DETECTED NOT DETECTED Final   Parainfluenza Virus 3 NOT DETECTED NOT DETECTED Final   Parainfluenza Virus 4 NOT DETECTED NOT DETECTED Final   Respiratory Syncytial Virus DETECTED (A) NOT DETECTED Final   Bordetella pertussis NOT DETECTED NOT DETECTED Final   Bordetella Parapertussis NOT DETECTED NOT DETECTED Final   Chlamydophila pneumoniae NOT DETECTED NOT DETECTED Final   Mycoplasma pneumoniae NOT DETECTED NOT DETECTED Final    Comment: Performed at [facility], [address], [place], [place] [address]  Culture, blood (Routine X 2) w Reflex to ID Panel     Status: Abnormal (Preliminary result)   Collection Time: 09/17/23 11:36 AM   Specimen: BLOOD LEFT FOREARM  Result Value Ref Range Status   Specimen Description   Final    BLOOD LEFT FOREARM Performed at [facility], [address] [place], [place] [address]    Special Requests   Final    BOTTLES DRAWN AEROBIC AND ANAEROBIC Blood Culture results may not be optimal due to an inadequate volume of blood received in culture  bottles Performed at [facility], [address] [place], [place] [address]    Culture  Setup Time   Final    GRAM POSITIVE COCCI IN CLUSTERS ANAEROBIC BOTTLE ONLY CRITICAL VALUE NOTED.  VALUE IS CONSISTENT WITH PREVIOUSLY REPORTED AND CALLED VALUE. Performed at [facility], [address] [place], [place] [address]    Culture STAPHYLOCOCCUS EPIDERMIDIS (A)  Final   Report Status PENDING  Incomplete  Culture, blood (Routine X 2) w Reflex to ID Panel     Status: Abnormal (Preliminary result)   Collection Time: 09/17/23 11:36 AM   Specimen: BLOOD LEFT FOREARM  Result Value Ref Range Status   Specimen Description   Final    BLOOD LEFT FOREARM Performed at [facility], [address] [place], [place] [address]    Special Requests   Final    BOTTLES DRAWN AEROBIC AND ANAEROBIC Blood Culture results may not be optimal due to an inadequate volume of blood received in culture bottles Performed at [facility], [address] [place], [place] [address]    Culture  Setup Time   Final    GRAM POSITIVE COCCI IN BOTH AEROBIC AND ANAEROBIC BOTTLES CRITICAL RESULT CALLED TO, READ BACK BY AND VERIFIED WITH: PHARMD [id number] J. JADHIA, ADC Performed at [facility], [address] [place], [place] [address]    Culture STAPHYLOCOCCUS EPIDERMIDIS (A)  Final   Report Status PENDING  Incomplete  Blood Culture ID Panel (Reflexed)     Status: Abnormal   Collection Time: 09/17/23 11:36 AM  Result Value Ref Range Status   Enterococcus faecalis NOT DETECTED NOT DETECTED Final   Enterococcus Faecium NOT DETECTED NOT DETECTED Final   Listeria monocytogenes NOT DETECTED NOT DETECTED Final   Staphylococcus species DETECTED (A) NOT DETECTED Final    Comment: CRITICAL RESULT CALLED TO, READ BACK BY AND VERIFIED WITH: PHARMD [id number] J. JADHIA, ADC    Staphylococcus aureus (BCID) NOT DETECTED NOT DETECTED Final   Staphylococcus epidermidis  DETECTED (A) NOT DETECTED Final    Comment: Methicillin (oxacillin) resistant coagulase negative staphylococcus. Possible blood culture contaminant (unless isolated from more than one blood culture draw  or clinical case suggests pathogenicity). No antibiotic treatment is indicated for blood  culture contaminants. CRITICAL RESULT CALLED TO, READ BACK BY AND VERIFIED WITH: PHARMD [id number] J. JADHIA, ADC    Staphylococcus lugdunensis NOT DETECTED NOT DETECTED Final   Streptococcus species NOT DETECTED NOT DETECTED Final   Streptococcus agalactiae NOT DETECTED NOT DETECTED Final   Streptococcus pneumoniae NOT DETECTED NOT DETECTED Final   Streptococcus pyogenes NOT DETECTED NOT DETECTED Final   A.calcoaceticus-baumannii NOT DETECTED NOT DETECTED Final   Bacteroides fragilis NOT DETECTED NOT DETECTED Final   Enterobacterales NOT DETECTED NOT DETECTED Final   Enterobacter cloacae complex NOT DETECTED NOT DETECTED Final   Escherichia coli NOT DETECTED NOT DETECTED Final   Klebsiella aerogenes NOT DETECTED NOT DETECTED Final   Klebsiella oxytoca NOT DETECTED NOT DETECTED Final   Klebsiella pneumoniae NOT DETECTED NOT DETECTED Final   Proteus species NOT DETECTED NOT DETECTED Final   Salmonella species NOT DETECTED NOT DETECTED Final   Serratia marcescens NOT DETECTED NOT DETECTED Final   Haemophilus influenzae NOT DETECTED NOT DETECTED Final   Neisseria meningitidis NOT DETECTED NOT DETECTED Final   Pseudomonas aeruginosa NOT DETECTED NOT DETECTED Final   Stenotrophomonas maltophilia NOT DETECTED NOT DETECTED Final   Candida albicans NOT DETECTED NOT DETECTED Final   Candida auris NOT DETECTED NOT DETECTED Final   Candida glabrata NOT DETECTED NOT DETECTED Final   Candida krusei NOT DETECTED NOT DETECTED Final   Candida parapsilosis NOT DETECTED NOT DETECTED Final   Candida tropicalis NOT DETECTED NOT DETECTED Final   Cryptococcus neoformans/gattii NOT DETECTED NOT DETECTED Final    Methicillin resistance mecA/C DETECTED (A) NOT DETECTED Final    Comment: CRITICAL RESULT CALLED TO, READ BACK BY AND VERIFIED WITH: PHARMD [id number] J. JADHIA, ADC Performed at [facility], [address], [place], [place] [address]   Culture, blood (Routine X 2) w Reflex to ID Panel     Status: None (Preliminary result)   Collection Time: 09/18/23  4:11 PM   Specimen: BLOOD  Result Value Ref Range Status   Specimen Description   Final    BLOOD BLOOD RIGHT HAND Performed at [facility], [address], [place], [place] [address]    Special Requests   Final    BOTTLES DRAWN AEROBIC AND ANAEROBIC Blood Culture adequate volume Performed at [facility], [address], [place], [place] [address]    Culture   Final    NO GROWTH < 24 HOURS Performed at [facility], [address] [place], [place] [address]    Report Status PENDING  Incomplete  Culture, blood (Routine X 2) w Reflex to ID Panel     Status: None (Preliminary result)   Collection Time: 09/18/23  4:16 PM   Specimen: BLOOD  Result Value Ref Range Status   Specimen Description   Final    BLOOD BLOOD RIGHT HAND Performed at [facility], [address], [place], [place] [address]    Special Requests   Final    BOTTLES DRAWN AEROBIC AND ANAEROBIC Blood Culture results may not be optimal due to an inadequate volume of blood received in culture bottles Performed at [facility], [address], [place], [place] [address]    Culture   Final    NO GROWTH < 24 HOURS Performed at [facility], [address] [place], [place] [address]    Report Status PENDING  Incomplete  Lamar LELON Bucks, MD [facility] [email or web address] 09/19/2023, 5:28 PM

## 2023-09-19 NOTE — Progress Notes (Signed)
 PROGRESS NOTE    Dana Kennedy  FMW:[id number] DOB: 10/27/1965 DOA: 09/16/2023 P[facility]: [facility]   Brief Narrative: 58 year old with past medical history significant for tobacco abuse, SLE, GERD, asthma, diabetes presents with cough, fever for 2-1/2-week 2 pneumonia and RSV positive.  Assessment & Plan:   Principal Problem:   CAP (community acquired pneumonia) Active Problems:   Lupus (systemic lupus erythematosus) (HCC)   Asthma   Asthma exacerbation   Hypokalemia   1-Sepsis secondary to pneumonia and RSV: POA -Patient presents with tachycardia, fever, leukocytosis, source of infection pneumonia RSV. -Continue with IV ceftriaxone  and azithromycin  -Negative for PE patchy consolidation in the right upper lobe associated with tree in bud opacities in the right middle and lower lobes suspicious for bronchopneumonia. Off oxygen, breathing better, still congested.  Continue with current management.  Add hypertonic saline nebulizer.   Blood cultures 2 out of 2 positive for staph epi.  Due to her immunocompromise status we will proceed with adding vancomycin .  Repeated blood cultures prior to the initiation of vancomycin  no growth  to date.  -will discuss with ID  Acute hypoxic respiratory failure secondary to community-acquired pneumonia -CT angio chest: Negative for PE.Patchy consolidation in the right upper lobe with associated tree-in-bud opacities in the right middle and lower lobes, suspicious for bronchopneumonia. Right hilar lymphadenopathy is favored reactive. -Patient continues to be very short of breath, she has bilateral wheezing. -Continue with IV steroid, Pulmicort  and Brovana  -Continue with DuoNeb -Continue with guaifenesin  Continue with Tussionex twice daily as needed Continue with respiratory toilet.   Diabetes type 2: -A16: 6.3 -Continue with a sliding scale insulin   Asthma Exacerbation -Continue DuoNeb, add IV  Solu-Medrol  -Scheduled nebulizer, Pulmicort    Hypokalemia:  Replete orally  History of lupus   Estimated body mass index is 33.97 kg/m as calculated from the following:   Height as of 07/09/23: 5' 5 (1.651 m).   Weight as of this encounter: 92.6 kg.   DVT prophylaxis: Lovenox  Code Status: Full code Family Communication: Discussed with patient Disposition Plan:  Status is: Inpatient Remains inpatient appropriate because: Management of pneumonia    Consultants:  none  Procedures:  None  Antimicrobials:    Subjective: She is off of oxygen, breathing better, she still very congested and having persistent cough.  Objective: Vitals:   09/18/23 2056 09/19/23 0559 09/19/23 0953 09/19/23 1304  BP: (!) 142/86 (!) 143/93  (!) 141/84  Pulse: 99 79  86  Resp:  18  20  Temp: 98.4 F (36.9 C) 98.5 F (36.9 C)  98.6 F (37 C)  TempSrc: Oral Oral  Oral  SpO2: 92% 96% 96% 91%  Weight:        Intake/Output Summary (Last 24 hours) at 09/19/2023 1325 Last data filed at 09/19/2023 0106 Gross per 24 hour  Intake 1453.33 ml  Output --  Net 1453.33 ml   Filed Weights   09/17/23 1700  Weight: 92.6 kg    Examination:  General exam: NAD Respiratory system: Bilateral rhonchorous crackles Cardiovascular system:  S1, S 2 RRR Gastrointestinal system: BS present, soft, nt Extremities: No edema  Data Reviewed: I have personally reviewed following labs and imaging studies  CBC: Recent Labs  Lab 09/16/23 2033 09/18/23 0418 09/19/23 0511  WBC 10.9* 11.4* 10.6*  HGB 13.5 12.3 12.8  HCT 40.8 38.5 40.3  MCV 83.3 86.1 85.2  PLT 351 299 349   Basic Metabolic Panel: Recent Labs  Lab 09/16/23 2033 09/18/23 0418 09/19/23 0511  NA 138 136 139  K 3.5 3.1* 3.8  CL 104 103 106  CO2 21* 24 23  GLUCOSE 121* 104* 152*  BUN 12 6 8   CREATININE 0.79 0.60 0.50  CALCIUM 9.3 8.4* 9.4   GFR: Estimated Creatinine Clearance: 88.3 mL/min (by C-G formula based on SCr of 0.5  mg/dL). Liver Function Tests: No results for input(s): AST, ALT, ALKPHOS, BILITOT, PROT, ALBUMIN in the last 168 hours. No results for input(s): LIPASE, AMYLASE in the last 168 hours. No results for input(s): AMMONIA in the last 168 hours. Coagulation Profile: No results for input(s): INR, PROTIME in the last 168 hours. Cardiac Enzymes: No results for input(s): CKTOTAL, CKMB, CKMBINDEX, TROPONINI in the last 168 hours. BNP (last 3 results) No results for input(s): PROBNP in the last 8760 hours. HbA1C: Recent Labs    09/17/23 1520  HGBA1C 6.3*   CBG: Recent Labs  Lab 09/18/23 1132 09/18/23 1804 09/18/23 2029 09/19/23 0746 09/19/23 1158  GLUCAP 110* 196* 203* 150* 133*   Lipid Profile: No results for input(s): CHOL, HDL, LDLCALC, TRIG, CHOLHDL, LDLDIRECT in the last 72 hours. Thyroid Function Tests: No results for input(s): TSH, T4TOTAL, FREET4, T3FREE, THYROIDAB in the last 72 hours. Anemia Panel: No results for input(s): VITAMINB12, FOLATE, FERRITIN, TIBC, IRON , RETICCTPCT in the last 72 hours. Sepsis Labs: Recent Labs  Lab 09/17/23 0243  LATICACIDVEN 0.6    Recent Results (from the past 240 hours)  SARS Coronavirus 2 by RT PCR (hospital order, performed in [facility] hospital lab) *cepheid single result test* Anterior Nasal Swab     Status: None   Collection Time: 09/16/23  7:45 PM   Specimen: Anterior Nasal Swab  Result Value Ref Range Status   SARS Coronavirus 2 by RT PCR NEGATIVE NEGATIVE Final    Comment: (NOTE) SARS-CoV-2 target nucleic acids are NOT DETECTED.  The SARS-CoV-2 RNA is generally detectable in upper and lower respiratory specimens during the acute phase of infection. The lowest concentration of SARS-CoV-2 viral copies this assay can detect is 250 copies / mL. A negative result does not preclude SARS-CoV-2 infection and should not be used as the sole basis for treatment or  other patient management decisions.  A negative result may occur with improper specimen collection / handling, submission of specimen other than nasopharyngeal swab, presence of viral mutation(s) within the areas targeted by this assay, and inadequate number of viral copies (<250 copies / mL). A negative result must be combined with clinical observations, patient history, and epidemiological information.  Fact Sheet for Patients:   roadlaptop.co.za  Fact Sheet for Healthcare Providers: http://kim-miller.com/  This test is not yet approved or  cleared by the [place]  FDA and has been authorized for detection and/or diagnosis of SARS-CoV-2 by FDA under an Emergency Use Authorization (EUA).  This EUA will remain in effect (meaning this test can be used) for the duration of the COVID-19 declaration under Section 564(b)(1) of the Act, 21 U.S.C. section 360bbb-3(b)(1), unless the authorization is terminated or revoked sooner.  Performed at [facility], [address], [place], [place] [address]   Respiratory (~20 pathogens) panel by PCR     Status: Abnormal   Collection Time: 09/16/23  7:45 PM   Specimen: Nasopharyngeal Swab; Respiratory  Result Value Ref Range Status   Adenovirus NOT DETECTED NOT DETECTED Final   Coronavirus 229E NOT DETECTED NOT DETECTED Final    Comment: (NOTE) The Coronavirus on the Respiratory Panel, DOES NOT test for the novel  Coronavirus (2019 nCoV)    Coronavirus HKU1 NOT DETECTED NOT DETECTED Final   Coronavirus NL63 NOT DETECTED NOT DETECTED Final   Coronavirus OC43 NOT DETECTED NOT DETECTED Final   Metapneumovirus NOT DETECTED NOT DETECTED Final   Rhinovirus / Enterovirus NOT DETECTED NOT DETECTED Final   Influenza A NOT DETECTED NOT DETECTED Final   Influenza B NOT DETECTED NOT DETECTED Final   Parainfluenza Virus 1 NOT DETECTED NOT DETECTED Final   Parainfluenza Virus 2 NOT  DETECTED NOT DETECTED Final   Parainfluenza Virus 3 NOT DETECTED NOT DETECTED Final   Parainfluenza Virus 4 NOT DETECTED NOT DETECTED Final   Respiratory Syncytial Virus DETECTED (A) NOT DETECTED Final   Bordetella pertussis NOT DETECTED NOT DETECTED Final   Bordetella Parapertussis NOT DETECTED NOT DETECTED Final   Chlamydophila pneumoniae NOT DETECTED NOT DETECTED Final   Mycoplasma pneumoniae NOT DETECTED NOT DETECTED Final    Comment: Performed at [facility], [address], [place], [place] [address]  Culture, blood (Routine X 2) w Reflex to ID Panel     Status: None (Preliminary result)   Collection Time: 09/17/23 11:36 AM   Specimen: BLOOD LEFT FOREARM  Result Value Ref Range Status   Specimen Description   Final    BLOOD LEFT FOREARM Performed at [facility], [address] [place], [place] [address]    Special Requests   Final    BOTTLES DRAWN AEROBIC AND ANAEROBIC Blood Culture results may not be optimal due to an inadequate volume of blood received in culture bottles Performed at [facility], [address], [place], [place] [address]    Culture  Setup Time   Final    GRAM POSITIVE COCCI IN CLUSTERS ANAEROBIC BOTTLE ONLY CRITICAL VALUE NOTED.  VALUE IS CONSISTENT WITH PREVIOUSLY REPORTED AND CALLED VALUE. Performed at [facility], [address], [place], [place] [address]    Culture GRAM POSITIVE COCCI  Final   Report Status PENDING  Incomplete  Culture, blood (Routine X 2) w Reflex to ID Panel     Status: Abnormal (Preliminary result)   Collection Time: 09/17/23 11:36 AM   Specimen: BLOOD LEFT FOREARM  Result Value Ref Range Status   Specimen Description   Final    BLOOD LEFT FOREARM Performed at [facility], [address] [place], [place] [address]    Special Requests   Final    BOTTLES DRAWN AEROBIC AND ANAEROBIC Blood Culture results may not be optimal due to an inadequate volume of blood received in culture  bottles Performed at [facility], [address], [place], [place] [address]    Culture  Setup Time   Final    GRAM POSITIVE COCCI IN BOTH AEROBIC AND ANAEROBIC BOTTLES CRITICAL RESULT CALLED TO, READ BACK BY AND VERIFIED WITH: PHARMD [id number] J. JADHIA, ADC Performed at [facility], [address], [place], [place] [address]    Culture STAPHYLOCOCCUS EPIDERMIDIS (A)  Final   Report Status PENDING  Incomplete  Blood Culture ID Panel (Reflexed)     Status: Abnormal   Collection Time: 09/17/23 11:36 AM  Result Value Ref Range Status   Enterococcus faecalis NOT DETECTED NOT DETECTED Final   Enterococcus Faecium NOT DETECTED NOT DETECTED Final   Listeria monocytogenes NOT DETECTED NOT DETECTED Final   Staphylococcus species DETECTED (A) NOT DETECTED Final    Comment: CRITICAL RESULT CALLED TO, READ BACK BY AND VERIFIED WITH: PHARMD  [phone] J. JADHIA, ADC    Staphylococcus aureus (BCID) NOT DETECTED NOT DETECTED Final   Staphylococcus epidermidis DETECTED (A) NOT DETECTED Final    Comment: Methicillin (oxacillin) resistant coagulase negative staphylococcus. Possible blood culture contaminant (unless isolated from more than one blood culture draw or clinical case suggests pathogenicity). No antibiotic treatment is indicated for blood  culture contaminants. CRITICAL RESULT CALLED TO, READ BACK BY AND VERIFIED WITH: PHARMD [id number] J. JADHIA, ADC    Staphylococcus lugdunensis NOT DETECTED NOT DETECTED Final   Streptococcus species NOT DETECTED NOT DETECTED Final   Streptococcus agalactiae NOT DETECTED NOT DETECTED Final   Streptococcus pneumoniae NOT DETECTED NOT DETECTED Final   Streptococcus pyogenes NOT DETECTED NOT DETECTED Final   A.calcoaceticus-baumannii NOT DETECTED NOT DETECTED Final   Bacteroides fragilis NOT DETECTED NOT DETECTED Final   Enterobacterales NOT DETECTED NOT DETECTED Final   Enterobacter cloacae complex NOT DETECTED NOT DETECTED  Final   Escherichia coli NOT DETECTED NOT DETECTED Final   Klebsiella aerogenes NOT DETECTED NOT DETECTED Final   Klebsiella oxytoca NOT DETECTED NOT DETECTED Final   Klebsiella pneumoniae NOT DETECTED NOT DETECTED Final   Proteus species NOT DETECTED NOT DETECTED Final   Salmonella species NOT DETECTED NOT DETECTED Final   Serratia marcescens NOT DETECTED NOT DETECTED Final   Haemophilus influenzae NOT DETECTED NOT DETECTED Final   Neisseria meningitidis NOT DETECTED NOT DETECTED Final   Pseudomonas aeruginosa NOT DETECTED NOT DETECTED Final   Stenotrophomonas maltophilia NOT DETECTED NOT DETECTED Final   Candida albicans NOT DETECTED NOT DETECTED Final   Candida auris NOT DETECTED NOT DETECTED Final   Candida glabrata NOT DETECTED NOT DETECTED Final   Candida krusei NOT DETECTED NOT DETECTED Final   Candida parapsilosis NOT DETECTED NOT DETECTED Final   Candida tropicalis NOT DETECTED NOT DETECTED Final   Cryptococcus neoformans/gattii NOT DETECTED NOT DETECTED Final   Methicillin resistance mecA/C DETECTED (A) NOT DETECTED Final    Comment: CRITICAL RESULT CALLED TO, READ BACK BY AND VERIFIED WITH: PHARMD [phone] J. JADHIA, ADC Performed at [facility], [address] [place], [place] [address]   Culture, blood (Routine X 2) w Reflex to ID Panel     Status: None (Preliminary result)   Collection Time: 09/18/23  4:11 PM   Specimen: BLOOD  Result Value Ref Range Status   Specimen Description   Final    BLOOD BLOOD RIGHT HAND Performed at [facility], [address] [place], [place] [address]    Special Requests   Final    BOTTLES DRAWN AEROBIC AND ANAEROBIC Blood Culture adequate volume Performed at [facility], [address], [place], [place] [address]    Culture   Final    NO GROWTH < 24 HOURS Performed at [facility], [address], [place], [place] [address]    Report Status PENDING  Incomplete  Culture, blood  (Routine X 2) w Reflex to ID Panel     Status: None (Preliminary result)   Collection Time: 09/18/23  4:16 PM   Specimen: BLOOD  Result Value Ref Range Status   Specimen Description   Final    BLOOD BLOOD RIGHT HAND Performed at [facility], [address], [place], [place] [address]    Special Requests   Final    BOTTLES DRAWN AEROBIC AND ANAEROBIC Blood Culture results may not be optimal due to an inadequate volume of blood received in culture bottles Performed  at [facility], [address] [place], [place] [address]    Culture   Final    NO GROWTH < 24 HOURS Performed at [facility], [address], [place], [place] [address]    Report Status PENDING  Incomplete         Radiology Studies: No results found.       Scheduled Meds:  arformoterol   15 mcg Nebulization BID   azithromycin   500 mg Oral Daily   budesonide  (PULMICORT ) nebulizer solution  0.25 mg Nebulization BID   enoxaparin  (LOVENOX ) injection  50 mg Subcutaneous Q24H   fluticasone   2 spray Each Nare Daily   guaiFENesin   1,200 mg Oral BID   insulin  aspart  0-15 Units Subcutaneous TID WC   insulin  aspart  0-5 Units Subcutaneous QHS   ipratropium-albuterol   3 mL Nebulization BID   methylPREDNISolone  (SOLU-MEDROL ) injection  40 mg Intravenous Q12H   sodium chloride  HYPERTONIC  4 mL Nebulization BID   Continuous Infusions:  cefTRIAXone  (ROCEPHIN )  IV 1 g (09/19/23 0106)   vancomycin  1,500 mg (09/18/23 1817)     LOS: 2 days    Time spent: 35 Minutes    Adriana Quinby A Alexandros Ewan, MD [facility]   If 7PM-7AM, please contact night-coverage [email or web address]  09/19/2023, 1:25 PM

## 2023-09-20 ENCOUNTER — Telehealth (HOSPITAL_COMMUNITY): Payer: Self-pay | Admitting: Pharmacy Technician

## 2023-09-20 ENCOUNTER — Other Ambulatory Visit (HOSPITAL_COMMUNITY): Payer: Self-pay

## 2023-09-20 DIAGNOSIS — J189 Pneumonia, unspecified organism: Secondary | ICD-10-CM | POA: Diagnosis not present

## 2023-09-20 LAB — CULTURE, BLOOD (ROUTINE X 2)

## 2023-09-20 LAB — GLUCOSE, CAPILLARY
Glucose-Capillary: 141 mg/dL — ABNORMAL HIGH (ref 70–99)
Glucose-Capillary: 149 mg/dL — ABNORMAL HIGH (ref 70–99)
Glucose-Capillary: 123 mg/dL — ABNORMAL HIGH (ref 70–99)
Glucose-Capillary: 112 mg/dL — ABNORMAL HIGH (ref 70–99)

## 2023-09-20 MED ORDER — CARMEX CLASSIC LIP BALM EX OINT: TOPICAL_OINTMENT | CUTANEOUS | Status: DC | PRN

## 2023-09-20 NOTE — Progress Notes (Signed)
 Patient tearful this am and shared that she is being evicted from her apartment on Tuesday. Asked for any available resources. TOC made aware.

## 2023-09-20 NOTE — Telephone Encounter (Signed)
 Patient Product/process Development Scientist completed.    The patient is insured through [employer] [place].     Ran test claim for linezolid  (Zyvox ) 600 mg and the current 4 day co-pay is $4.00.   This test claim was processed through [facility]- copay amounts may vary at other pharmacies due to pharmacy/plan contracts, or as the patient moves through the different stages of their insurance plan.     Reyes Sharps, CPHT Pharmacy Technician III Certified Patient Advocate [facility] Patient Advocate Team Direct Number: [phone]  Fax: [phone]

## 2023-09-20 NOTE — Progress Notes (Signed)
 Patient able to ambulate 247ft on room air. SOB but O2 maintained at 93%.

## 2023-09-20 NOTE — Plan of Care (Signed)
  Problem: Education: Goal: Knowledge of General Education information will improve Description: Including pain rating scale, medication(s)/side effects and non-pharmacologic comfort measures Outcome: Progressing   Problem: Coping: Goal: Level of anxiety will decrease Outcome: Progressing   Problem: Pain Management: Goal: General experience of comfort will improve Outcome: Progressing   Problem: Activity: Goal: Risk for activity intolerance will decrease Outcome: Adequate for Discharge

## 2023-09-20 NOTE — TOC Initial Note (Signed)
 Transition of Care [facility]) - Initial/Assessment Note    Patient Details  Name: Dana Kennedy MRN: [id number] Date of Birth: 04/30/1966  [facility] CM/SW Contact:    Bascom Service, RN Phone Number: 09/20/2023, 9:52 AM  Clinical Narrative: spoke to patient about resources available for eviction of apt-Dept Social Services;shelter resources added.                 Expected Discharge Plan: Home/Self Care Barriers to Discharge: No Barriers Identified   Patient Goals and CMS Choice Patient states their goals for this hospitalization and ongoing recovery are:: Home CMS Medicare.gov Compare Post Acute Care list provided to:: Patient Choice offered to / list presented to : Patient      Expected Discharge Plan and Services   Discharge Planning Services: CM Consult Post Acute Care Choice: Resumption of Svcs/PTA Provider Living arrangements for the past 2 months: Apartment                                      Prior Living Arrangements/Services Living arrangements for the past 2 months: Apartment Lives with:: Self                   Activities of Daily Living   ADL Screening (condition at time of admission) Independently performs ADLs?: Yes (appropriate for developmental age) Is the patient deaf or have difficulty hearing?: No Does the patient have difficulty seeing, even when wearing glasses/contacts?: No Does the patient have difficulty concentrating, remembering, or making decisions?: No  Permission Sought/Granted                  Emotional Assessment              Admission diagnosis:  Hypoxia [R09.02] CAP (community acquired pneumonia) [J18.9] Pneumonia of right lower lobe due to infectious organism [J18.9] Patient Active Problem List   Diagnosis Date Noted   Sepsis (HCC) 06/27/2020   Hypokalemia 06/27/2020   Tobacco use 01/01/2020   Acute lower UTI 12/31/2019   Asthma exacerbation 12/31/2019   Bacterial vaginosis  12/31/2019   CAP (community acquired pneumonia) 12/31/2019   Diverticulitis large intestine 10/24/2018   Acute diverticulitis 08/18/2018   Vaginal spotting 08/18/2018   GERD (gastroesophageal reflux disease)    Asthma    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 [place] [address] Phone: [phone] Fax: [phone]     Social Drivers of Health (SDOH) Social History: SDOH Screenings   Food Insecurity: No Food Insecurity (09/17/2023)  Housing: Low Risk  (09/17/2023)  Transportation Needs: No Transportation Needs (09/17/2023)  Utilities: Not At Risk (09/17/2023)  Tobacco Use: High Risk (09/18/2023)   SDOH Interventions:     Readmission Risk Interventions     No data to display

## 2023-09-20 NOTE — Progress Notes (Signed)
 PROGRESS NOTE    Dana Kennedy  FMW:[id number] DOB: 1966-04-01 DOA: 09/16/2023 PCP: [facility]   Brief Narrative: 58 year old with past medical history significant for tobacco abuse, SLE, GERD, asthma, diabetes presents with cough, fever for 2-1/2-week 2 pneumonia and RSV positive.  Assessment & Plan:   Principal Problem:   CAP (community acquired pneumonia) Active Problems:   Lupus (systemic lupus erythematosus) (HCC)   Asthma   Asthma exacerbation   Hypokalemia   1-Sepsis secondary to pneumonia and RSV: POA -Patient presents with tachycardia, fever, leukocytosis, source of infection pneumonia RSV. -Negative for PE patchy consolidation in the right upper lobe associated with tree in bud opacities in the right middle and lower lobes suspicious for bronchopneumonia. -Off oxygen.  -Continue with current management.  -Started hypertonic saline nebulizer.  -Ceftriaxone  and Azithro change to Linezolid .    Blood cultures 2 out of 2 positive for staph epi.   Repeated blood cultures prior to the initiation of vancomycin  no growth  to date.  -ID consulted, recommend 5 days if Linezolid .   Acute hypoxic respiratory failure secondary to community-acquired pneumonia -CT angio chest: Negative for PE.Patchy consolidation in the right upper lobe with associated tree-in-bud opacities in the right middle and lower lobes, suspicious for bronchopneumonia. Right hilar lymphadenopathy is favored reactive. -Continue with IV steroid, Pulmicort  and Brovana  -Continue with DuoNeb -Continue with guaifenesin  Continue with Tussionex twice daily as needed Continue with respiratory toilet.   Diabetes type 2: -A16: 6.3 -Continue with a sliding scale insulin   Asthma Exacerbation -Continue DuoNeb, add IV Solu-Medrol  -Scheduled nebulizer, Pulmicort    Hypokalemia:  Replete orally  History of lupus   Estimated body mass index is 32.95 kg/m as calculated  from the following:   Height as of this encounter: 5' 6 (1.676 m).   Weight as of this encounter: 92.6 kg.   DVT prophylaxis: Lovenox  Code Status: Full code Family Communication: Discussed with patient Disposition Plan:  Status is: Inpatient Remains inpatient appropriate because: Management of pneumonia    Consultants:  none  Procedures:  None  Antimicrobials:    Subjective: Reports some improvement of cough and shortness of breath, she is still not back to her baseline.  She has shortness of breath on minimal activity  Objective: Vitals:   09/20/23 0519 09/20/23 0603 09/20/23 0732 09/20/23 1313  BP: (!) 153/89   (!) 146/93  Pulse: 82   82  Resp: 16   20  Temp: 97.6 F (36.4 C)   98 F (36.7 C)  TempSrc: Oral   Oral  SpO2: 96%  93% 97%  Weight:      Height:  5' 6 (1.676 m)      Intake/Output Summary (Last 24 hours) at 09/20/2023 1403 Last data filed at 09/19/2023 2338 Gross per 24 hour  Intake 120 ml  Output --  Net 120 ml   Filed Weights   09/17/23 1700  Weight: 92.6 kg    Examination:  General exam: NAD Respiratory system: BL ronchus, crackles.  Cardiovascular system:  S 1, S 2  RRR Gastrointestinal system: BS present, soft, nt Extremities: No edema  Data Reviewed: I have personally reviewed following labs and imaging studies  CBC: Recent Labs  Lab 09/16/23 2033 09/18/23 0418 09/19/23 0511  WBC 10.9* 11.4* 10.6*  HGB 13.5 12.3 12.8  HCT 40.8 38.5 40.3  MCV 83.3 86.1 85.2  PLT 351 299 349   Basic Metabolic Panel: Recent Labs  Lab 09/16/23 2033 09/18/23 0418 09/19/23 0511  NA 138 136 139  K 3.5 3.1* 3.8  CL 104 103 106  CO2 21* 24 23  GLUCOSE 121* 104* 152*  BUN 12 6 8   CREATININE 0.79 0.60 0.50  CALCIUM 9.3 8.4* 9.4   GFR: Estimated Creatinine Clearance: 90 mL/min (by C-G formula based on SCr of 0.5 mg/dL). Liver Function Tests: No results for input(s): AST, ALT, ALKPHOS, BILITOT, PROT, ALBUMIN in the last 168  hours. No results for input(s): LIPASE, AMYLASE in the last 168 hours. No results for input(s): AMMONIA in the last 168 hours. Coagulation Profile: No results for input(s): INR, PROTIME in the last 168 hours. Cardiac Enzymes: No results for input(s): CKTOTAL, CKMB, CKMBINDEX, TROPONINI in the last 168 hours. BNP (last 3 results) No results for input(s): PROBNP in the last 8760 hours. HbA1C: Recent Labs    09/17/23 1520  HGBA1C 6.3*   CBG: Recent Labs  Lab 09/19/23 1158 09/19/23 1639 09/19/23 2100 09/20/23 0724 09/20/23 1130  GLUCAP 133* 138* 189* 141* 149*   Lipid Profile: No results for input(s): CHOL, HDL, LDLCALC, TRIG, CHOLHDL, LDLDIRECT in the last 72 hours. Thyroid Function Tests: No results for input(s): TSH, T4TOTAL, FREET4, T3FREE, THYROIDAB in the last 72 hours. Anemia Panel: No results for input(s): VITAMINB12, FOLATE, FERRITIN, TIBC, IRON , RETICCTPCT in the last 72 hours. Sepsis Labs: Recent Labs  Lab 09/17/23 0243  LATICACIDVEN 0.6    Recent Results (from the past 240 hours)  SARS Coronavirus 2 by RT PCR (hospital order, performed in [facility] hospital lab) *cepheid single result test* Anterior Nasal Swab     Status: None   Collection Time: 09/16/23  7:45 PM   Specimen: Anterior Nasal Swab  Result Value Ref Range Status   SARS Coronavirus 2 by RT PCR NEGATIVE NEGATIVE Final    Comment: (NOTE) SARS-CoV-2 target nucleic acids are NOT DETECTED.  The SARS-CoV-2 RNA is generally detectable in upper and lower respiratory specimens during the acute phase of infection. The lowest concentration of SARS-CoV-2 viral copies this assay can detect is 250 copies / mL. A negative result does not preclude SARS-CoV-2 infection and should not be used as the sole basis for treatment or other patient management decisions.  A negative result may occur with improper specimen collection / handling, submission of  specimen other than nasopharyngeal swab, presence of viral mutation(s) within the areas targeted by this assay, and inadequate number of viral copies (<250 copies / mL). A negative result must be combined with clinical observations, patient history, and epidemiological information.  Fact Sheet for Patients:   roadlaptop.co.za  Fact Sheet for Healthcare Providers: http://kim-miller.com/  This test is not yet approved or  cleared by the [place]  FDA and has been authorized for detection and/or diagnosis of SARS-CoV-2 by FDA under an Emergency Use Authorization (EUA).  This EUA will remain in effect (meaning this test can be used) for the duration of the COVID-19 declaration under Section 564(b)(1) of the Act, 21 U.S.C. section 360bbb-3(b)(1), unless the authorization is terminated or revoked sooner.  Performed at [facility], [address], [place], [place] [address]   Respiratory (~20 pathogens) panel by PCR     Status: Abnormal   Collection Time: 09/16/23  7:45 PM   Specimen: Nasopharyngeal Swab; Respiratory  Result Value Ref Range Status   Adenovirus NOT DETECTED NOT DETECTED Final   Coronavirus 229E NOT DETECTED NOT DETECTED Final    Comment: (NOTE) The Coronavirus on the Respiratory Panel, DOES NOT test for the novel  Coronavirus (2019 nCoV)    Coronavirus HKU1 NOT DETECTED NOT DETECTED Final   Coronavirus NL63 NOT DETECTED NOT DETECTED Final   Coronavirus OC43 NOT DETECTED NOT DETECTED Final   Metapneumovirus NOT DETECTED NOT DETECTED Final   Rhinovirus / Enterovirus NOT DETECTED NOT DETECTED Final   Influenza A NOT DETECTED NOT DETECTED Final   Influenza B NOT DETECTED NOT DETECTED Final   Parainfluenza Virus 1 NOT DETECTED NOT DETECTED Final   Parainfluenza Virus 2 NOT DETECTED NOT DETECTED Final   Parainfluenza Virus 3 NOT DETECTED NOT DETECTED Final   Parainfluenza Virus 4 NOT DETECTED NOT  DETECTED Final   Respiratory Syncytial Virus DETECTED (A) NOT DETECTED Final   Bordetella pertussis NOT DETECTED NOT DETECTED Final   Bordetella Parapertussis NOT DETECTED NOT DETECTED Final   Chlamydophila pneumoniae NOT DETECTED NOT DETECTED Final   Mycoplasma pneumoniae NOT DETECTED NOT DETECTED Final    Comment: Performed at [facility], [address] [place], [place] [address]  Culture, blood (Routine X 2) w Reflex to ID Panel     Status: Abnormal   Collection Time: 09/17/23 11:36 AM   Specimen: BLOOD LEFT FOREARM  Result Value Ref Range Status   Specimen Description   Final    BLOOD LEFT FOREARM Performed at [facility], [address] [place], [place] [address]    Special Requests   Final    BOTTLES DRAWN AEROBIC AND ANAEROBIC Blood Culture results may not be optimal due to an inadequate volume of blood received in culture bottles Performed at [facility], [address], [place], [place] [address]    Culture  Setup Time   Final    GRAM POSITIVE COCCI IN CLUSTERS ANAEROBIC BOTTLE ONLY CRITICAL VALUE NOTED.  VALUE IS CONSISTENT WITH PREVIOUSLY REPORTED AND CALLED VALUE.    Culture (A)  Final    STAPHYLOCOCCUS EPIDERMIDIS SUSCEPTIBILITIES PERFORMED ON PREVIOUS CULTURE WITHIN THE LAST 5 DAYS. Performed at [facility], [address], [place], [place] [address]    Report Status 09/20/2023 FINAL  Final  Culture, blood (Routine X 2) w Reflex to ID Panel     Status: Abnormal   Collection Time: 09/17/23 11:36 AM   Specimen: BLOOD LEFT FOREARM  Result Value Ref Range Status   Specimen Description   Final    BLOOD LEFT FOREARM Performed at [facility], [address] [place], [place] [address]    Special Requests   Final    BOTTLES DRAWN AEROBIC AND ANAEROBIC Blood Culture results may not be optimal due to an inadequate volume of blood received in culture bottles Performed at [facility], [address] [place], [place] [address]    Culture  Setup Time   Final    GRAM POSITIVE COCCI IN BOTH AEROBIC AND ANAEROBIC BOTTLES CRITICAL RESULT CALLED TO, READ BACK BY AND VERIFIED WITH: PHARMD [id number] J. JADHIA, ADC Performed at [facility], [address], [place], [place] [address]    Culture STAPHYLOCOCCUS EPIDERMIDIS (A)  Final   Report Status 09/20/2023 FINAL  Final   Organism ID, Bacteria STAPHYLOCOCCUS EPIDERMIDIS  Final      Susceptibility   Staphylococcus epidermidis - MIC*    CIPROFLOXACIN  <=0.5 SENSITIVE Sensitive     ERYTHROMYCIN >=8 RESISTANT Resistant     GENTAMICIN <=0.5 SENSITIVE Sensitive     OXACILLIN >=4 RESISTANT Resistant     TETRACYCLINE 2 SENSITIVE Sensitive  VANCOMYCIN  2 SENSITIVE Sensitive     TRIMETH/SULFA <=10 SENSITIVE Sensitive     CLINDAMYCIN  >=8 RESISTANT Resistant     RIFAMPIN <=0.5 SENSITIVE Sensitive     Inducible Clindamycin  NEGATIVE Sensitive     * STAPHYLOCOCCUS EPIDERMIDIS  Blood Culture ID Panel (Reflexed)     Status: Abnormal   Collection Time: 09/17/23 11:36 AM  Result Value Ref Range Status   Enterococcus faecalis NOT DETECTED NOT DETECTED Final   Enterococcus Faecium NOT DETECTED NOT DETECTED Final   Listeria monocytogenes NOT DETECTED NOT DETECTED Final   Staphylococcus species DETECTED (A) NOT DETECTED Final    Comment: CRITICAL RESULT CALLED TO, READ BACK BY AND VERIFIED WITH: PHARMD [id number] J. JADHIA, ADC    Staphylococcus aureus (BCID) NOT DETECTED NOT DETECTED Final   Staphylococcus epidermidis DETECTED (A) NOT DETECTED Final    Comment: Methicillin (oxacillin) resistant coagulase negative staphylococcus. Possible blood culture contaminant (unless isolated from more than one blood culture draw or clinical case suggests pathogenicity). No antibiotic treatment is indicated for blood  culture contaminants. CRITICAL RESULT CALLED TO, READ BACK BY AND VERIFIED WITH: PHARMD [id number] J. JADHIA, ADC    Staphylococcus  lugdunensis NOT DETECTED NOT DETECTED Final   Streptococcus species NOT DETECTED NOT DETECTED Final   Streptococcus agalactiae NOT DETECTED NOT DETECTED Final   Streptococcus pneumoniae NOT DETECTED NOT DETECTED Final   Streptococcus pyogenes NOT DETECTED NOT DETECTED Final   A.calcoaceticus-baumannii NOT DETECTED NOT DETECTED Final   Bacteroides fragilis NOT DETECTED NOT DETECTED Final   Enterobacterales NOT DETECTED NOT DETECTED Final   Enterobacter cloacae complex NOT DETECTED NOT DETECTED Final   Escherichia coli NOT DETECTED NOT DETECTED Final   Klebsiella aerogenes NOT DETECTED NOT DETECTED Final   Klebsiella oxytoca NOT DETECTED NOT DETECTED Final   Klebsiella pneumoniae NOT DETECTED NOT DETECTED Final   Proteus species NOT DETECTED NOT DETECTED Final   Salmonella species NOT DETECTED NOT DETECTED Final   Serratia marcescens NOT DETECTED NOT DETECTED Final   Haemophilus influenzae NOT DETECTED NOT DETECTED Final   Neisseria meningitidis NOT DETECTED NOT DETECTED Final   Pseudomonas aeruginosa NOT DETECTED NOT DETECTED Final   Stenotrophomonas maltophilia NOT DETECTED NOT DETECTED Final   Candida albicans NOT DETECTED NOT DETECTED Final   Candida auris NOT DETECTED NOT DETECTED Final   Candida glabrata NOT DETECTED NOT DETECTED Final   Candida krusei NOT DETECTED NOT DETECTED Final   Candida parapsilosis NOT DETECTED NOT DETECTED Final   Candida tropicalis NOT DETECTED NOT DETECTED Final   Cryptococcus neoformans/gattii NOT DETECTED NOT DETECTED Final   Methicillin resistance mecA/C DETECTED (A) NOT DETECTED Final    Comment: CRITICAL RESULT CALLED TO, READ BACK BY AND VERIFIED WITH: PHARMD [id number] J. JADHIA, ADC Performed at [facility], [address] [place], [place] [address]   Culture, blood (Routine X 2) w Reflex to ID Panel     Status: None (Preliminary result)   Collection Time: 09/18/23  4:11 PM   Specimen: BLOOD  Result Value Ref Range Status   Specimen  Description   Final    BLOOD BLOOD RIGHT HAND Performed at [facility], [address], [place], [place] [address]    Special Requests   Final    BOTTLES DRAWN AEROBIC AND ANAEROBIC Blood Culture adequate volume Performed at [facility], [address], [place], [place] [address]    Culture   Final    NO GROWTH 2 DAYS Performed at  [facility], [address], [place], [place] [address]    Report Status PENDING  Incomplete  Culture, blood (Routine X 2) w Reflex to ID Panel     Status: None (Preliminary result)   Collection Time: 09/18/23  4:16 PM   Specimen: BLOOD  Result Value Ref Range Status   Specimen Description   Final    BLOOD BLOOD RIGHT HAND Performed at [facility], [address], [place], [place] [address]    Special Requests   Final    BOTTLES DRAWN AEROBIC AND ANAEROBIC Blood Culture results may not be optimal due to an inadequate volume of blood received in culture bottles Performed at [facility], [address] [place], [place] [address]    Culture   Final    NO GROWTH 2 DAYS Performed at [facility], [address], [place], [place] [address]    Report Status PENDING  Incomplete         Radiology Studies: No results found.       Scheduled Meds:  arformoterol   15 mcg Nebulization BID   budesonide  (PULMICORT ) nebulizer solution  0.25 mg Nebulization BID   enoxaparin  (LOVENOX ) injection  50 mg Subcutaneous Q24H   fluticasone   2 spray Each Nare Daily   guaiFENesin   1,200 mg Oral BID   insulin  aspart  0-15 Units Subcutaneous TID WC   insulin  aspart  0-5 Units Subcutaneous QHS   ipratropium-albuterol   3 mL Nebulization BID   linezolid   600 mg Oral Q12H   methylPREDNISolone  (SOLU-MEDROL ) injection  40 mg Intravenous Q12H   Continuous Infusions:     LOS: 3 days    Time spent: 35 Minutes    Jolan Upchurch A Seanmichael Salmons, MD [facility]   If 7PM-7AM,  please contact night-coverage [email or web address]  09/20/2023, 2:03 PM

## 2023-09-21 ENCOUNTER — Other Ambulatory Visit (HOSPITAL_COMMUNITY): Payer: Self-pay

## 2023-09-21 DIAGNOSIS — J189 Pneumonia, unspecified organism: Secondary | ICD-10-CM | POA: Diagnosis not present

## 2023-09-21 LAB — GLUCOSE, CAPILLARY
Glucose-Capillary: 125 mg/dL — ABNORMAL HIGH (ref 70–99)
Glucose-Capillary: 115 mg/dL — ABNORMAL HIGH (ref 70–99)

## 2023-09-21 MED ORDER — PREDNISONE 50 MG PO TABS
50.0000 mg | ORAL_TABLET | Freq: Every day | ORAL | 0 refills | Status: AC
Start: 2023-09-21 — End: 2023-09-23

## 2023-09-21 MED ORDER — ALBUTEROL SULFATE (2.5 MG/3ML) 0.083% IN NEBU: 2.5000 mg | INHALATION_SOLUTION | RESPIRATORY_TRACT | 12 refills | Status: DC | PRN

## 2023-09-21 MED ORDER — LINEZOLID 600 MG PO TABS: 600.0000 mg | ORAL_TABLET | Freq: Two times a day (BID) | ORAL | 0 refills | Status: AC

## 2023-09-21 MED ORDER — COMPRESSOR/NEBULIZER MISC: 0 refills | Status: DC

## 2023-09-21 MED ORDER — MENTHOL 3 MG MT LOZG: 1.0000 | LOZENGE | OROMUCOSAL | 12 refills | Status: DC | PRN

## 2023-09-21 MED ORDER — GUAIFENESIN ER 600 MG PO TB12: 1200.0000 mg | ORAL_TABLET | Freq: Two times a day (BID) | ORAL | 0 refills | Status: DC

## 2023-09-21 MED ORDER — CARMEX CLASSIC LIP BALM EX OINT: TOPICAL_OINTMENT | CUTANEOUS | Status: DC | PRN

## 2023-09-21 MED ORDER — BUDESONIDE 0.25 MG/2ML IN SUSP: 0.2500 mg | Freq: Two times a day (BID) | RESPIRATORY_TRACT | 12 refills | Status: DC

## 2023-09-21 MED ORDER — ARFORMOTEROL TARTRATE 15 MCG/2ML IN NEBU: 15.0000 ug | INHALATION_SOLUTION | Freq: Two times a day (BID) | RESPIRATORY_TRACT | 0 refills | Status: DC

## 2023-09-21 NOTE — Plan of Care (Signed)
  Problem: Education: Goal: Knowledge of General Education information will improve Description: Including pain rating scale, medication(s)/side effects and non-pharmacologic comfort measures Outcome: Progressing   Problem: Clinical Measurements: Goal: Respiratory complications will improve Outcome: Progressing   Problem: Coping: Goal: Level of anxiety will decrease Outcome: Progressing   Problem: Pain Management: Goal: General experience of comfort will improve Outcome: Progressing   Problem: Activity: Goal: Risk for activity intolerance will decrease Outcome: Adequate for Discharge

## 2023-09-21 NOTE — Discharge Summary (Signed)
 Dana Kennedy FMW:[id number] DOB: [birth year] DOA: 09/16/2023  PCP: [facility]  Admit date: 09/16/2023  Discharge date: 09/21/2023  Admitted From: Home   disposition: Home   Recommendations for Outpatient Follow-up:   Follow up with PCP in 5 to 7 days   Home Health: N/A Equipment/Devices: N/A Consultations: Infectious disease Discharge Condition: Improved CODE STATUS: Full  Diet Order             Diet - low sodium heart healthy           Diet Carb Modified Fluid consistency: Thin; Room service appropriate? Yes  Diet effective now                    Chief Complaint  Patient presents with   Cough   Chest Pain     Brief history of present illness from the day of admission and additional interim summary     Dana Kennedy is a 58 y.o. female with medical history significant for ongoing tobacco abuse, SLE, GERD, asthma, diabetes being admitted to the hospital with community-acquired pneumonia.  States she has been having upper respiratory symptoms, cough, subjective fever for about the last 2.5 weeks.  Denies significant chest pain, has been having vomiting.  She takes metformin, and some lupus medication whose name she can currently not recall.  She does have a history of asthma, is prescribed a daily inhaler, but does not take it.                                                                   Hospital Course   58 year old with past medical history significant for tobacco abuse, SLE, GERD, asthma, diabetes was admitted with RSV pneumonia.  CTA was negative for PE however there was patchy consolidation in the RUL with associated tree-in-bud opacities suspicious for bronchopneumonia.  Patient was started on ceftriaxone  azithromycin  for CAP.  She was also  started on steroids, Pulmicort  and Brovana  with as needed DuoNebs for concurrent treatment of COPD flare.  Patient's course was complicated with 2 out of 2 blood cultures positive for Staph epidermidis.  Given her immunocompromise status, she was started on vancomycin  however patient was seen by infectious disease who recommended 5-day course of linezolid .  On day of discharge patient felt much improved and was able to ambulate in the halls without difficulty or need for oxygen.  Patient was discharged home to complete a 5-day course of linezolid  which would cover both her CAP as well as her staph epi.  Patient was also initiated on as well as initiation of Pulmicort  and Brovana  with instructions to follow-up with her PCP    Discharge diagnosis     Principal Problem:   CAP (community acquired pneumonia) Active Problems:   Lupus (  systemic lupus erythematosus) (HCC)   Asthma   Asthma exacerbation   Hypokalemia    Discharge instructions    Discharge Instructions     Diet - low sodium heart healthy   Complete by: As directed    Discharge instructions   Complete by: As directed    Make sure you know how to give yourself a breathing treatment before you leave here. You will be on Linezolid  for another 4.5 days. You will be on prednisone  for 2 more days. You will need to keep taking inhaled Pulmicort  and Brovana  until you are told to stop by your doctor. You can take inhaled albuterol  as needed for shortness of breath. You will need to see your doctor in 5-7 days to see how you are doing and also to make changes to your inhaled medications as needed. If you find that you remain short of breath, please contact your PCP or come back to the ED.   Increase activity slowly   Complete by: As directed        Discharge Medications   Allergies as of 09/21/2023   No Known Allergies      Medication List     STOP taking these medications    oxyCODONE -acetaminophen  5-325 MG tablet Commonly  known as: PERCOCET/ROXICET       TAKE these medications    acetaminophen  325 MG tablet Commonly known as: TYLENOL  Take 2 tablets (650 mg total) by mouth every 6 (six) hours as needed for mild pain (or Fever >/= 101).   aerochamber Z-Stat Plus/medium inhaler Use as instructed   albuterol  (2.5 MG/3ML) 0.083% nebulizer solution Commonly known as: PROVENTIL  Take 3 mLs (2.5 mg total) by nebulization every 2 (two) hours as needed for wheezing.   arformoterol  15 MCG/2ML Nebu Commonly known as: BROVANA  Take 2 mLs (15 mcg total) by nebulization 2 (two) times daily.   budesonide  0.25 MG/2ML nebulizer solution Commonly known as: PULMICORT  Take 2 mLs (0.25 mg total) by nebulization 2 (two) times daily.   Compressor/Nebulizer Misc Use as directed.   fluticasone  50 MCG/ACT nasal spray Commonly known as: FLONASE  Place 2 sprays into both nostrils daily.   guaiFENesin  600 MG 12 hr tablet Commonly known as: MUCINEX  Take 2 tablets (1,200 mg total) by mouth 2 (two) times daily.   hydrochlorothiazide  12.5 MG capsule Commonly known as: Microzide  Take 1 capsule (12.5 mg total) by mouth daily.   ibuprofen  600 MG tablet Commonly known as: ADVIL  Take 1 tablet (600 mg total) by mouth every 6 (six) hours as needed. What changed:  how much to take reasons to take this   linezolid  600 MG tablet Commonly known as: ZYVOX  Take 1 tablet (600 mg total) by mouth every 12 (twelve) hours for 9 doses.   lip balm ointment Apply topically as needed.   menthol -cetylpyridinium 3 MG lozenge Commonly known as: CEPACOL Take 1 lozenge (3 mg total) by mouth as needed for sore throat.   Olopatadine HCl 0.2 % Soln Place 1 drop into both eyes daily.   predniSONE  50 MG tablet Commonly known as: DELTASONE  Take 1 tablet (50 mg total) by mouth daily for 2 days.          Major procedures and Radiology Reports - PLEASE review detailed and final reports thoroughly  -       CT Angio Chest Pulmonary  Embolism (PE) W or WO Contrast Result Date: 09/17/2023 CLINICAL DATA:  Pulmonary embolism (PE) suspected, high prob. Shortness of breath. Productive cough. EXAM: CT  ANGIOGRAPHY CHEST WITH CONTRAST TECHNIQUE: Multidetector CT imaging of the chest was performed using the standard protocol during bolus administration of intravenous contrast. Multiplanar CT image reconstructions and MIPs were obtained to evaluate the vascular anatomy. RADIATION DOSE REDUCTION: This exam was performed according to the departmental dose-optimization program which includes automated exposure control, adjustment of the mA and/or kV according to patient size and/or use of iterative reconstruction technique. CONTRAST:  75mL OMNIPAQUE  IOHEXOL  350 MG/ML SOLN COMPARISON:  CTA chest 11/01/2017. FINDINGS: Cardiovascular: Satisfactory opacification of the pulmonary arteries to the segmental level. No evidence of pulmonary embolism. Normal heart size. No pericardial effusion. Mediastinum/Nodes: Small hiatal hernia. Right hilar lymphadenopathy is favored reactive. Lungs/Pleura: Patchy consolidation in the right upper lobe with associated tree-in-bud opacities in the right middle and lower lobes, suspicious for bronchopneumonia. No pleural effusion or pneumothorax. Upper Abdomen: No acute abnormality. Musculoskeletal: No chest wall abnormality. No acute or significant osseous findings. Review of the MIP images confirms the above findings. IMPRESSION: 1. No evidence of pulmonary embolism. 2. Patchy consolidation in the right upper lobe with associated tree-in-bud opacities in the right middle and lower lobes, suspicious for bronchopneumonia. 3. Right hilar lymphadenopathy is favored reactive. 4. Small hiatal hernia. Electronically Signed   By: Ryan Chess M.D.   On: 09/17/2023 08:25   DG Chest 2 View Result Date: 09/16/2023 CLINICAL DATA:  One-week history of generalized body aches, chest pain, productive cough EXAM: CHEST - 2 VIEW  COMPARISON:  Chest radiograph dated 04/15/2022 FINDINGS: Patient is rotated slightly to the right. Normal lung volumes. Right basilar patchy opacity. No pleural effusion or pneumothorax. The heart size and mediastinal contours are within normal limits. No acute osseous abnormality. IMPRESSION: Right basilar patchy opacity, which may represent atelectasis or pneumonia. Electronically Signed   By: Limin  Xu M.D.   On: 09/16/2023 20:45    Micro Results    Recent Results (from the past 240 hours)  SARS Coronavirus 2 by RT PCR (hospital order, performed in [facility] hospital lab) *cepheid single result test* Anterior Nasal Swab     Status: None   Collection Time: 09/16/23  7:45 PM   Specimen: Anterior Nasal Swab  Result Value Ref Range Status   SARS Coronavirus 2 by RT PCR NEGATIVE NEGATIVE Final    Comment: (NOTE) SARS-CoV-2 target nucleic acids are NOT DETECTED.  The SARS-CoV-2 RNA is generally detectable in upper and lower respiratory specimens during the acute phase of infection. The lowest concentration of SARS-CoV-2 viral copies this assay can detect is 250 copies / mL. A negative result does not preclude SARS-CoV-2 infection and should not be used as the sole basis for treatment or other patient management decisions.  A negative result may occur with improper specimen collection / handling, submission of specimen other than nasopharyngeal swab, presence of viral mutation(s) within the areas targeted by this assay, and inadequate number of viral copies (<250 copies / mL). A negative result must be combined with clinical observations, patient history, and epidemiological information.  Fact Sheet for Patients:   roadlaptop.co.za  Fact Sheet for Healthcare Providers: http://kim-miller.com/  This test is not yet approved or  cleared by the [place]  FDA and has been authorized for detection and/or diagnosis of SARS-CoV-2 by FDA under  an Emergency Use Authorization (EUA).  This EUA will remain in effect (meaning this test can be used) for the duration of the COVID-19 declaration under Section 564(b)(1) of the Act, 21 U.S.C. section 360bbb-3(b)(1), unless the authorization is terminated or revoked  sooner.  Performed at [facility], [address], [place], [place] [address]   Respiratory (~20 pathogens) panel by PCR     Status: Abnormal   Collection Time: 09/16/23  7:45 PM   Specimen: Nasopharyngeal Swab; Respiratory  Result Value Ref Range Status   Adenovirus NOT DETECTED NOT DETECTED Final   Coronavirus 229E NOT DETECTED NOT DETECTED Final    Comment: (NOTE) The Coronavirus on the Respiratory Panel, DOES NOT test for the novel  Coronavirus (2019 nCoV)    Coronavirus HKU1 NOT DETECTED NOT DETECTED Final   Coronavirus NL63 NOT DETECTED NOT DETECTED Final   Coronavirus OC43 NOT DETECTED NOT DETECTED Final   Metapneumovirus NOT DETECTED NOT DETECTED Final   Rhinovirus / Enterovirus NOT DETECTED NOT DETECTED Final   Influenza A NOT DETECTED NOT DETECTED Final   Influenza B NOT DETECTED NOT DETECTED Final   Parainfluenza Virus 1 NOT DETECTED NOT DETECTED Final   Parainfluenza Virus 2 NOT DETECTED NOT DETECTED Final   Parainfluenza Virus 3 NOT DETECTED NOT DETECTED Final   Parainfluenza Virus 4 NOT DETECTED NOT DETECTED Final   Respiratory Syncytial Virus DETECTED (A) NOT DETECTED Final   Bordetella pertussis NOT DETECTED NOT DETECTED Final   Bordetella Parapertussis NOT DETECTED NOT DETECTED Final   Chlamydophila pneumoniae NOT DETECTED NOT DETECTED Final   Mycoplasma pneumoniae NOT DETECTED NOT DETECTED Final    Comment: Performed at [facility], [address] [place], [place] [address]  Culture, blood (Routine X 2) w Reflex to ID Panel     Status: Abnormal   Collection Time: 09/17/23 11:36 AM   Specimen: BLOOD LEFT FOREARM  Result Value Ref Range Status   Specimen Description    Final    BLOOD LEFT FOREARM Performed at [facility], [address], [place], [place] [address]    Special Requests   Final    BOTTLES DRAWN AEROBIC AND ANAEROBIC Blood Culture results may not be optimal due to an inadequate volume of blood received in culture bottles Performed at [facility], [address] [place], [place] [address]    Culture  Setup Time   Final    GRAM POSITIVE COCCI IN CLUSTERS ANAEROBIC BOTTLE ONLY CRITICAL VALUE NOTED.  VALUE IS CONSISTENT WITH PREVIOUSLY REPORTED AND CALLED VALUE.    Culture (A)  Final    STAPHYLOCOCCUS EPIDERMIDIS SUSCEPTIBILITIES PERFORMED ON PREVIOUS CULTURE WITHIN THE LAST 5 DAYS. Performed at [facility], [address] [place], [place] [address]    Report Status 09/20/2023 FINAL  Final  Culture, blood (Routine X 2) w Reflex to ID Panel     Status: Abnormal   Collection Time: 09/17/23 11:36 AM   Specimen: BLOOD LEFT FOREARM  Result Value Ref Range Status   Specimen Description   Final    BLOOD LEFT FOREARM Performed at [facility], [address], [place], [place] [address]    Special Requests   Final    BOTTLES DRAWN AEROBIC AND ANAEROBIC Blood Culture results may not be optimal due to an inadequate volume of blood received in culture bottles Performed at [facility], [address], [place], [place] [address]    Culture  Setup Time   Final    GRAM POSITIVE COCCI IN BOTH AEROBIC AND ANAEROBIC BOTTLES CRITICAL RESULT CALLED TO, READ BACK BY AND VERIFIED WITH: PHARMD [id number] J. JADHIA, ADC Performed at [facility], [address] [place],  [place] [address]    Culture STAPHYLOCOCCUS EPIDERMIDIS (A)  Final   Report Status 09/20/2023 FINAL  Final   Organism ID, Bacteria STAPHYLOCOCCUS EPIDERMIDIS  Final      Susceptibility   Staphylococcus epidermidis - MIC*    CIPROFLOXACIN  <=0.5 SENSITIVE Sensitive     ERYTHROMYCIN >=8 RESISTANT Resistant     GENTAMICIN  <=0.5 SENSITIVE Sensitive     OXACILLIN >=4 RESISTANT Resistant     TETRACYCLINE 2 SENSITIVE Sensitive     VANCOMYCIN  2 SENSITIVE Sensitive     TRIMETH/SULFA <=10 SENSITIVE Sensitive     CLINDAMYCIN  >=8 RESISTANT Resistant     RIFAMPIN <=0.5 SENSITIVE Sensitive     Inducible Clindamycin  NEGATIVE Sensitive     * STAPHYLOCOCCUS EPIDERMIDIS  Blood Culture ID Panel (Reflexed)     Status: Abnormal   Collection Time: 09/17/23 11:36 AM  Result Value Ref Range Status   Enterococcus faecalis NOT DETECTED NOT DETECTED Final   Enterococcus Faecium NOT DETECTED NOT DETECTED Final   Listeria monocytogenes NOT DETECTED NOT DETECTED Final   Staphylococcus species DETECTED (A) NOT DETECTED Final    Comment: CRITICAL RESULT CALLED TO, READ BACK BY AND VERIFIED WITH: PHARMD [id number] J. JADHIA, ADC    Staphylococcus aureus (BCID) NOT DETECTED NOT DETECTED Final   Staphylococcus epidermidis DETECTED (A) NOT DETECTED Final    Comment: Methicillin (oxacillin) resistant coagulase negative staphylococcus. Possible blood culture contaminant (unless isolated from more than one blood culture draw or clinical case suggests pathogenicity). No antibiotic treatment is indicated for blood  culture contaminants. CRITICAL RESULT CALLED TO, READ BACK BY AND VERIFIED WITH: PHARMD [id number] J. JADHIA, ADC    Staphylococcus lugdunensis NOT DETECTED NOT DETECTED Final   Streptococcus species NOT DETECTED NOT DETECTED Final   Streptococcus agalactiae NOT DETECTED NOT DETECTED Final   Streptococcus pneumoniae NOT DETECTED NOT DETECTED Final   Streptococcus pyogenes NOT DETECTED NOT DETECTED Final   A.calcoaceticus-baumannii NOT DETECTED NOT DETECTED Final   Bacteroides fragilis NOT DETECTED NOT DETECTED Final   Enterobacterales NOT DETECTED NOT DETECTED Final   Enterobacter cloacae complex NOT DETECTED NOT DETECTED Final   Escherichia coli NOT DETECTED NOT DETECTED Final   Klebsiella aerogenes NOT DETECTED NOT  DETECTED Final   Klebsiella oxytoca NOT DETECTED NOT DETECTED Final   Klebsiella pneumoniae NOT DETECTED NOT DETECTED Final   Proteus species NOT DETECTED NOT DETECTED Final   Salmonella species NOT DETECTED NOT DETECTED Final   Serratia marcescens NOT DETECTED NOT DETECTED Final   Haemophilus influenzae NOT DETECTED NOT DETECTED Final   Neisseria meningitidis NOT DETECTED NOT DETECTED Final   Pseudomonas aeruginosa NOT DETECTED NOT DETECTED Final   Stenotrophomonas maltophilia NOT DETECTED NOT DETECTED Final   Candida albicans NOT DETECTED NOT DETECTED Final   Candida auris NOT DETECTED NOT DETECTED Final   Candida glabrata NOT DETECTED NOT DETECTED Final   Candida krusei NOT DETECTED NOT DETECTED Final   Candida parapsilosis NOT DETECTED NOT DETECTED Final   Candida tropicalis NOT DETECTED NOT DETECTED Final   Cryptococcus neoformans/gattii NOT DETECTED NOT DETECTED Final   Methicillin resistance mecA/C DETECTED (A) NOT DETECTED Final    Comment: CRITICAL RESULT CALLED TO, READ BACK BY AND VERIFIED WITH: PHARMD [id number] J. JADHIA, ADC Performed at [facility], [address] [place], [place] [address]   Culture, blood (Routine X 2) w Reflex to ID Panel     Status: None (Preliminary result)   Collection Time: 09/18/23  4:11 PM   Specimen:  BLOOD  Result Value Ref Range Status   Specimen Description   Final    BLOOD BLOOD RIGHT HAND Performed at [facility] [address], [place], [place] [address]    Special Requests   Final    BOTTLES DRAWN AEROBIC AND ANAEROBIC Blood Culture adequate volume Performed at [facility], [address] [place], [place] [address]    Culture   Final    NO GROWTH 3 DAYS Performed at [facility], [address], [place], [place] [address]    Report Status PENDING  Incomplete  Culture, blood (Routine X 2) w Reflex to ID Panel     Status: None (Preliminary result)   Collection Time: 09/18/23   4:16 PM   Specimen: BLOOD  Result Value Ref Range Status   Specimen Description   Final    BLOOD BLOOD RIGHT HAND Performed at [facility], [address], [place], [place] [address]    Special Requests   Final    BOTTLES DRAWN AEROBIC AND ANAEROBIC Blood Culture results may not be optimal due to an inadequate volume of blood received in culture bottles Performed at [facility], [address], [place], [place] [address]    Culture   Final    NO GROWTH 3 DAYS Performed at [facility], [address] [place], [place] [address]    Report Status PENDING  Incomplete    Today   Subjective    Dana Kennedy feels much improved since admission.  Feels ready to go home.  Denies chest pain, shortness of breath or abdominal pain.  Feels they can take care of themselves with the resources they have at home.  Objective   Blood pressure (!) 141/99, pulse 68, temperature 98.4 F (36.9 C), temperature source Oral, resp. rate 14, height 5' 6 (1.676 m), weight 92.6 kg, last menstrual period 02/03/2018, SpO2 96%.   Intake/Output Summary (Last 24 hours) at 09/21/2023 1249 Last data filed at 09/21/2023 0901 Gross per 24 hour  Intake 360 ml  Output --  Net 360 ml    Exam General: Patient appears well and in good spirits sitting up in bed in no acute distress.  Eyes: sclera anicteric, conjuctiva mild injection bilaterally CVS: S1-S2, regular  Respiratory:  decreased air entry bilaterally secondary to decreased inspiratory effort, rales at bases  GI: NABS, soft, NT  LE: No edema.  Neuro: A/O x 3, Moving all extremities equally with normal strength, CN 3-12 intact, grossly nonfocal.  Psych: patient is logical and coherent, judgement and insight appear normal, mood and affect appropriate to situation.    Data Review   CBC w Diff:  Lab Results  Component Value Date   WBC 10.6 (H) 09/19/2023   HGB 12.8 09/19/2023   HGB 12.6 09/09/2018    HGB 13.3 05/01/2016   HCT 40.3 09/19/2023   HCT 40.3 05/01/2016   PLT 349 09/19/2023   PLT 396 09/09/2018   PLT 331 05/01/2016   LYMPHOPCT 26 07/09/2023   LYMPHOPCT 32.2 05/01/2016   MONOPCT 9 07/09/2023   MONOPCT 10.9 05/01/2016   EOSPCT 2 07/09/2023   EOSPCT 1.8 05/01/2016   BASOPCT 0 07/09/2023   BASOPCT 0.3 05/01/2016    CMP:  Lab Results  Component Value Date   NA 139 09/19/2023   NA 142 05/08/2013   K 3.8 09/19/2023   K 4.3 05/08/2013   CL 106 09/19/2023   CL 108 (H) 10/16/2012  CO2 23 09/19/2023   CO2 21 (L) 05/08/2013   BUN 8 09/19/2023   BUN 15.0 05/08/2013   CREATININE 0.50 09/19/2023   CREATININE 0.84 09/09/2018   CREATININE 0.8 05/08/2013   GLU 112 (H) 11/05/2012   PROT 7.5 06/27/2020   PROT 7.0 05/08/2013   ALBUMIN 4.3 06/27/2020   ALBUMIN 3.5 05/08/2013   BILITOT 0.5 06/27/2020   BILITOT <0.2 (L) 09/09/2018   BILITOT 0.28 05/08/2013   ALKPHOS 74 06/27/2020   ALKPHOS 71 05/08/2013   AST 17 06/27/2020   AST 11 (L) 09/09/2018   AST 12 05/08/2013   ALT 13 06/27/2020   ALT 11 09/09/2018   ALT 11 05/08/2013  .   Total Time in preparing paper work, data evaluation and todays exam - 35 minutes  Braidon Chermak Tublu Delron Comer M.D on 09/21/2023 at 12:49 PM  [facility]

## 2023-09-21 NOTE — TOC Transition Note (Addendum)
 Transition of Care [facility]) - Discharge Note   Patient Details  Name: Dana Kennedy MRN: [id number] Date of Birth: May 10, 1966  [facility]) CM/SW Contact:  Bascom Service, RN Phone Number: 09/21/2023, 12:54 PM   Clinical Narrative:  d/c home no further CM needs.  -2:16p-noted for neb machine-[employer] rep Jermaine to deliver to rm prior d/c.    Final next level of care: Home/Self Care Barriers to Discharge: No Barriers Identified   Patient Goals and CMS Choice Patient states their goals for this hospitalization and ongoing recovery are:: Home CMS Medicare.gov Compare Post Acute Care list provided to:: Patient Choice offered to / list presented to : [name] ownership interest in [facility].provided to:: Patient    Discharge Placement                       Discharge Plan and Services Additional resources added to the After Visit Summary for     Discharge Planning Services: CM Consult Post Acute Care Choice: Resumption of Svcs/PTA Provider                               Social Drivers of Health (SDOH) Interventions SDOH Screenings   Food Insecurity: No Food Insecurity (09/17/2023)  Housing: Low Risk  (09/17/2023)  Transportation Needs: No Transportation Needs (09/17/2023)  Utilities: Not At Risk (09/17/2023)  Tobacco Use: High Risk (09/18/2023)     Readmission Risk Interventions     No data to display

## 2023-09-23 LAB — CULTURE, BLOOD (ROUTINE X 2)
Culture: NO GROWTH
Culture: NO GROWTH
Special Requests: ADEQUATE

## 2024-01-14 ENCOUNTER — Encounter (HOSPITAL_COMMUNITY): Payer: Self-pay

## 2024-01-14 ENCOUNTER — Inpatient Hospital Stay (HOSPITAL_COMMUNITY)
Admission: EM | Admit: 2024-01-14 | Discharge: 2024-01-16 | DRG: 159 | Disposition: A | Discharge status: Home / Self Care | Payer: MEDICAID | Attending: Family Medicine | Admitting: Family Medicine

## 2024-01-14 ENCOUNTER — Emergency Department (HOSPITAL_COMMUNITY): Payer: MEDICAID

## 2024-01-14 ENCOUNTER — Other Ambulatory Visit: Payer: Self-pay

## 2024-01-14 DIAGNOSIS — Z7951 Long term (current) use of inhaled steroids: Secondary | ICD-10-CM

## 2024-01-14 DIAGNOSIS — Z7984 Long term (current) use of oral hypoglycemic drugs: Secondary | ICD-10-CM

## 2024-01-14 DIAGNOSIS — Z79899 Other long term (current) drug therapy: Secondary | ICD-10-CM

## 2024-01-14 DIAGNOSIS — Z96642 Presence of left artificial hip joint: Secondary | ICD-10-CM | POA: Diagnosis present

## 2024-01-14 DIAGNOSIS — K029 Dental caries, unspecified: Secondary | ICD-10-CM | POA: Diagnosis present

## 2024-01-14 DIAGNOSIS — E119 Type 2 diabetes mellitus without complications: Secondary | ICD-10-CM | POA: Diagnosis present

## 2024-01-14 DIAGNOSIS — M329 Systemic lupus erythematosus, unspecified: Secondary | ICD-10-CM | POA: Diagnosis present

## 2024-01-14 DIAGNOSIS — Z87891 Personal history of nicotine dependence: Secondary | ICD-10-CM

## 2024-01-14 DIAGNOSIS — K122 Cellulitis and abscess of mouth: Principal | ICD-10-CM | POA: Diagnosis present

## 2024-01-14 DIAGNOSIS — Z789 Other specified health status: Secondary | ICD-10-CM

## 2024-01-14 DIAGNOSIS — K047 Periapical abscess without sinus: Principal | ICD-10-CM

## 2024-01-14 DIAGNOSIS — Z8249 Family history of ischemic heart disease and other diseases of the circulatory system: Secondary | ICD-10-CM

## 2024-01-14 DIAGNOSIS — J45909 Unspecified asthma, uncomplicated: Secondary | ICD-10-CM | POA: Diagnosis present

## 2024-01-14 DIAGNOSIS — Z8379 Family history of other diseases of the digestive system: Secondary | ICD-10-CM

## 2024-01-14 DIAGNOSIS — Z809 Family history of malignant neoplasm, unspecified: Secondary | ICD-10-CM

## 2024-01-14 LAB — CBC WITH DIFFERENTIAL/PLATELET
Abs Immature Granulocytes: 0.03 10*3/uL (ref 0.00–0.07)
Basophils Absolute: 0 10*3/uL (ref 0.0–0.1)
Basophils Relative: 0 %
Eosinophils Absolute: 0.1 10*3/uL (ref 0.0–0.5)
Eosinophils Relative: 2 %
HCT: 41.1 % (ref 36.0–46.0)
Hemoglobin: 13.1 g/dL (ref 12.0–15.0)
Immature Granulocytes: 0 %
Lymphocytes Relative: 24 %
Lymphs Abs: 2.3 10*3/uL (ref 0.7–4.0)
MCH: 27.2 pg (ref 26.0–34.0)
MCHC: 31.9 g/dL (ref 30.0–36.0)
MCV: 85.3 fL (ref 80.0–100.0)
Monocytes Absolute: 0.7 10*3/uL (ref 0.1–1.0)
Monocytes Relative: 7 %
Neutro Abs: 6.4 10*3/uL (ref 1.7–7.7)
Neutrophils Relative %: 67 %
Platelets: 326 10*3/uL (ref 150–400)
RBC: 4.82 MIL/uL (ref 3.87–5.11)
RDW: 14.7 % (ref 11.5–15.5)
WBC: 9.7 10*3/uL (ref 4.0–10.5)
nRBC: 0 % (ref 0.0–0.2)

## 2024-01-14 LAB — BASIC METABOLIC PANEL WITH GFR
Anion gap: 13 (ref 5–15)
BUN: 9 mg/dL (ref 6–20)
CO2: 23 mmol/L (ref 22–32)
Calcium: 9.1 mg/dL (ref 8.9–10.3)
Chloride: 106 mmol/L (ref 98–111)
Creatinine, Ser: 0.67 mg/dL (ref 0.44–1.00)
GFR, Estimated: >60 mL/min (ref >60)
Glucose, Bld: 181 mg/dL — ABNORMAL HIGH (ref 70–99)
Potassium: 3.6 mmol/L (ref 3.5–5.1)
Sodium: 142 mmol/L (ref 135–145)

## 2024-01-14 LAB — I-STAT CG4 LACTIC ACID, ED: Lactic Acid, Venous: 1.3 mmol/L (ref 0.5–1.9)

## 2024-01-14 LAB — GLUCOSE, CAPILLARY: Glucose-Capillary: 141 mg/dL — ABNORMAL HIGH (ref 70–99)

## 2024-01-14 MED ORDER — IBUPROFEN 400 MG PO TABS
400.0000 mg | ORAL_TABLET | Freq: Once | ORAL | Status: AC | PRN
  Administered 2024-01-14: 400 mg via ORAL
  Filled 2024-01-14: qty 1

## 2024-01-14 MED ORDER — ACETAMINOPHEN 650 MG RE SUPP: 650.0000 mg | Freq: Four times a day (QID) | RECTAL | Status: DC | PRN

## 2024-01-14 MED ORDER — SODIUM CHLORIDE 0.9 % IV SOLN
3.0000 g | Freq: Once | INTRAVENOUS | Status: AC
  Administered 2024-01-14: 3 g via INTRAVENOUS
  Filled 2024-01-14: qty 8

## 2024-01-14 MED ORDER — KETOROLAC TROMETHAMINE 15 MG/ML IJ SOLN
15.0000 mg | Freq: Four times a day (QID) | INTRAMUSCULAR | Status: DC | PRN
  Administered 2024-01-15: 15 mg via INTRAVENOUS
  Filled 2024-01-14: qty 1

## 2024-01-14 MED ORDER — IOHEXOL 350 MG/ML SOLN
75.0000 mL | Freq: Once | INTRAVENOUS | Status: AC | PRN
  Administered 2024-01-14: 75 mL via INTRAVENOUS

## 2024-01-14 MED ORDER — ACETAMINOPHEN 325 MG PO TABS
650.0000 mg | ORAL_TABLET | Freq: Four times a day (QID) | ORAL | Status: DC | PRN
  Administered 2024-01-14: 650 mg via ORAL

## 2024-01-14 MED ORDER — ENOXAPARIN SODIUM 40 MG/0.4ML IJ SOSY
40.0000 mg | PREFILLED_SYRINGE | INTRAMUSCULAR | Status: DC
  Administered 2024-01-15 – 2024-01-16 (×2): 40 mg via SUBCUTANEOUS
  Filled 2024-01-14 (×2): qty 0.4

## 2024-01-14 MED ORDER — KETOROLAC TROMETHAMINE 30 MG/ML IJ SOLN
30.0000 mg | Freq: Once | INTRAMUSCULAR | Status: AC
  Administered 2024-01-14: 30 mg via INTRAVENOUS
  Filled 2024-01-14: qty 1

## 2024-01-14 MED ORDER — OXYCODONE-ACETAMINOPHEN 5-325 MG PO TABS
1.0000 | ORAL_TABLET | ORAL | Status: AC | PRN
  Administered 2024-01-14 (×2): 1 via ORAL
  Filled 2024-01-14 (×2): qty 1

## 2024-01-14 MED ORDER — ALBUTEROL SULFATE (2.5 MG/3ML) 0.083% IN NEBU: 2.5000 mg | INHALATION_SOLUTION | RESPIRATORY_TRACT | Status: DC | PRN

## 2024-01-14 MED ORDER — SODIUM CHLORIDE 0.9 % IV SOLN
3.0000 g | Freq: Four times a day (QID) | INTRAVENOUS | Status: DC
  Administered 2024-01-14 – 2024-01-16 (×7): 3 g via INTRAVENOUS
  Filled 2024-01-14 (×7): qty 8

## 2024-01-14 NOTE — ED Provider Notes (Signed)
 Assume Care Note  Vitals  BP (!) 146/81 (BP Location: Right Arm)   Pulse 75   Temp 98.3 F (36.8 C) (Oral)   Resp 18   Ht 5\' 6"  (1.676 m)   Wt 92.5 kg   LMP 02/03/2018   SpO2 98%   BMI 32.93 kg/m   ED Course / MDM   Clinical Course as of 01/14/24 1620  Mon Jan 14, 2024  1508 S- broken tooth, s/p several abx, r-sided facial swelling, fever last night [ ]  CT  [AO]    Clinical Course User Index [AO] Lorain Robson, MD   Medical Decision Making Amount and/or Complexity of Data Reviewed Labs: ordered. Radiology: ordered.  Risk Prescription drug management. Decision regarding hospitalization.   CT imaging resulted with dental decay and lucency around the roots of the right mandibular tooth and subperiosteal abscess measuring 5 mm with regional cellulitis.  On my examination of the patient, right-sided face is swollen, but airway is patent and there is no trismus.  Unfortunately, no dentistry or oral surgeon is on-call currently.  Given that patient has failed outpatient antibiotics several times, she does require IV antibiotics and admission with dentistry consult in the morning and consideration to transition to p.o. antibiotics shortly.  Handoff was given to admitting team.  Patient seen in conjunction with Dr. Zavitz, who agreed with the above work-up and plan of care.       Lorain Robson, MD 01/14/24 2322    Clay Cummins, MD 01/16/24 [phone]

## 2024-01-14 NOTE — Hospital Course (Signed)
 Dana Kennedy is a 57 y.o.female with a history of right tooth fracture  who was admitted to the [facility] Medicine Teaching Service at [facility] for mandibular tooth abscess and attendant cellulitis. Her hospital course is detailed below:  Dental Abscess Endorsed right jaw swelling and tenderness on presentation. Vital signs stable throughout.  Afebrile throughout.  Pain controlled with Tylenol  and Dilaudid . Had received Unasyn  3 mg x1 in the ED. Received through [facility] (4/28 - 4/30). Was discharged on on augmentin  and scheduled to complete 5-day course (4/28 - 5/3).  Will follow with Dr. Serge Dancer with [facility] outpatient. Given 1 week of Oxy for pain control   Other chronic conditions were medically managed with home medications and formulary alternatives as necessary (T2DM, Asthma)  PCP Follow-up Recommendations: [facility] follow-up for definitive treatment of fractured tooth

## 2024-01-14 NOTE — Discharge Instructions (Signed)
 Dear Dana Kennedy,  Thank you for letting us  participate in your care. You were hospitalized for right jaw swelling and fever and diagnosed with Dental abscess. You were treated with intravenous and oral antibiotics.  Your pain was controlled with Tylenol .  POST-HOSPITAL & CARE INSTRUCTIONS Continue to take your p.o. antibiotics for the next 3 days to complete a 5-day course of antibiotics. Go to your follow up appointments (listed below)   DOCTOR'S APPOINTMENT   No future appointments.   Take care and be well!  Family Medicine Teaching Service Inpatient Team [place]  [facility]  [address] [place], [place] [address] [phone]

## 2024-01-14 NOTE — ED Notes (Signed)
 Pt eating dinner/ transport aware of ready bed

## 2024-01-14 NOTE — Plan of Care (Signed)
 FMTS Interim Progress Note  S: Evaluated patient at bedside with Dr. Fernand Howard.  Patient reports that her pain is significant improved after receiving 30 mg of Toradol .  She is able to drink.  No difficulty breathing or swallowing.  O: BP 120/73 (BP Location: Left Arm)   Pulse 81   Temp 99.1 F (37.3 C) (Oral)   Resp 18   Ht 5\' 6"  (1.676 m)   Wt 91.5 kg   LMP 02/03/2018   SpO2 99%   BMI 32.56 kg/m    General: NAD, resting comfortably HEENT: Swollen right side mandible CV: RRR, no murmurs Respiratory: CTAB, normal work of breathing on room air  A/P: Dental abscess On Unasyn .  Pending dental evaluation in AM.  Pain improved with Toradol . - Toradol  15 mg every 6 hours as needed - Continue plan of care as outlined by family medicine H&P on 4/28  Lavada Porteous, DO 01/14/2024, 9:37 PM PGY-2, [facility] Family Medicine Service pager [phone]

## 2024-01-14 NOTE — Assessment & Plan Note (Addendum)
 Asthma: continue PRN albuterol , patient only needs 2x/week  T2DM (A1c 6.3%): Repeat A1c. CBGs with meals. Says she is taking metformin at home, does not know the dose. CBGs in mid-high 100s, will hold off on SSI for now.  SLE: Takes a medication at home for this but is unable to state which. Will continue to monitor.

## 2024-01-14 NOTE — H&P (Cosign Needed Addendum)
 Hospital Admission History and Physical Service Pager: [phone]  Patient name: Dana Kennedy Medical record number: [id number] Date of Birth: 1966-06-04 Age: 58 y.o. Gender: female  Primary Care Provider: [facility] Consultants: none Code Status: FULL  Preferred Emergency Contact: Tyra Galley, Sister [phone]  Chief Complaint: Right jaw swelling  Assessment and Plan: Dana Kennedy is a 58 y.o. female presenting with right jaw swelling and pain. Differential for this patient's presentation of this includes:  Dental abscess w/ cellulitis, very likely: Broke her tooth some months ago.  Febrile to 101 yesterday. Appears she has had multiple separate infections of this tooth over the past 6 months, treated with full courses of antibiotics. CT neck with contrast shows dental decay and new adjacent 5 mm abscess. Also notes regional cellulitis with mild reactive lymph nodes.  Angioedema unlikely: Patient has no known medication allergies. Has not been recently started on an ACE inhibitor.  Appears to have responded to antibiotics.  Salivary gland tumor, unlikely: While patient was a smoker as of 1 month ago, time course and presentation makes this unlikely.. Would not explain patient's fever.  Obvious infectious source with broken tooth, has had multiple infections in the past and treated with antibiotics.  Ruled out by CT.  Assessment & Plan Dental abscess Right lower mandible. S/p Unasyn  3g x1 in emergency department. Swelling much better now, per patient. Will continue IV Unasyn  overnight. PO antibiotics seemed to work for similar infections 3-6 months ago. Recurrent infection likely d/t untreated dental cary, complicated by new abscess/soft tissue cellulitis. Will reach out to dentistry in AM for surgical evaluation of abscess.  - Admit to family medicine teaching service, Dr. Bevin Bucks attending - Continue Unasyn  tomorrow per pharmacy consult,  consider transition to PO antibiotics tomorrow - Start PRN scheduled tylenol  1g q6h for tooth pain - Med-surg bed - Start regular diet - Fall precautions - Vitals/Pulse ox per floor Chronic health problem Asthma: continue PRN albuterol , patient only needs 2x/week  T2DM (A1c 6.3%): Repeat A1c. CBGs with meals. Says she is taking metformin at home, does not know the dose. CBGs in mid-high 100s, will hold off on SSI for now.  SLE: Takes a medication at home for this but is unable to state which. Will continue to monitor.  FEN/GI: Full diet VTE Prophylaxis: Lovenox   Disposition: Home  History of Present Illness:  Dana Kennedy is a 58 y.o. female presenting with a 6 month history of tooth pain and swelling after she fractured a tooth in her right lower mandible.   Patient states she broke her tooth 6 months ago.  Has completed multiple courses of antibiotics over that time for repeated infection, which typically respond to p.o. antibiotics.  Had 2 separate instances of completed antibiotic courses, which resolved her symptoms at that time.  Last episode was ~ 3 months ago.  Is unable to remember which antibiotics these were.    Patient woke up today with new right jaw swelling and pain.  Notes fever to 101 yesterday.  Denies subjective fevers and chills now.  Went to the hospital as she was worried that she could not swallow correctly and had pain with chewing.  At this time, patient can tolerate some solid food, no issues with fluids.  She asked if she could have a regular diet, as she is hungry.  Describes right jaw swelling as having improved significantly since coming to the ED and receiving IV antibiotics.  Notes dry  cough that started when she arrived at the emergency department.  Denies chest pain, shortness of breath, as well as fevers and chills today.  Passing urine and stool.  Endorses right sided headache to right temple.  Has been unable to receive definitive treatment (root  canal), as the tooth is acutely tender.  Had planned to go under anesthesia for removal, but this did not materialize.  In the ED, patient received Unasyn  and spot dose Percocet for pain.  Review Of Systems: Per HPI with the following additions: none  Pertinent Past Medical History:  Asthma Tobacco use disorder Diverticulitis T2DM SLE GERD  Remainder reviewed in history tab.   Pertinent Past Surgical History:  Partial colectomy Umbilical hernia repair Left total hip arthroplasty Tubal ligation  Remainder reviewed in history tab.  Pertinent Social History: Tobacco use: In remission for 1 month  Alcohol use: Denies Other Substance use: Denies  Pertinent Family History:  Mother: Cancer, HTN, MI Father: Liver disease Brother: HTN  Remainder reviewed in history tab.   Important Outpatient Medications:  On interview, patient is unclear as to what medication she is taking.  Did not take her meds this morning  Albuterol  as needed 2 times a week Metformin, of unknown dosage Medication for SLE, unknown   Remainder reviewed in medication history.   Objective: BP (!) 146/81 (BP Location: Right Arm)   Pulse 75   Temp 98.3 F (36.8 C) (Oral)   Resp 18   Ht 5\' 6"  (1.676 m)   Wt 92.5 kg   LMP 02/03/2018   SpO2 98%   BMI 32.93 kg/m  Exam: General: Well-appearing woman, laying in hospital bed, no acute distress Eyes: Nonicteric ENTM/neck: Unable to visualize tooth, tenderness to right jaw, upper neck.  No gross erythema.    Cardiovascular: Regular rate and rhythm, no murmurs rubs or gallops Respiratory: Clear to auscultation and bilateral lung fields Gastrointestinal: Mild diffuse abdominal discomfort to light palpation, this is baseline, previous umbilical hernia surgery MSK: ROM normal Neuro: EOMI, speech intact. Psych: Alert and oriented, mood and affect appropriate.  Labs:  CBC BMET  Recent Labs  Lab 01/14/24 1222  WBC 9.7  HGB 13.1  HCT 41.1  PLT  326   Recent Labs  Lab 01/14/24 1222  NA 142  K 3.6  CL 106  CO2 23  BUN 9  CREATININE 0.67  GLUCOSE 181*  CALCIUM 9.1     Lactic acid: 1.3  Imaging Studies Performed:  CT soft tissue neck W contrast (4/28):  IMPRESSION: 1. Dental decay and lucency around the roots of right mandibular tooth 30 with lateral cortical erosion and an adjacent subperiosteal abscess with maximal thickness of 5 mm. Regional cellulitis without evidence of remote abscess formation. 2. Mild reactive prominence of the level 1 lymph nodes on the right without suppuration.   Gwyndolyn Lerner, MD 01/14/2024, 4:32 PM PGY-1, [facility] Family Medicine  FPTS Intern pager: [phone], text pages welcome Secure chat group [facility] Teaching Service   I have verified that the resident's  findings are accurately documented in the resident's note. I have made edits and changes where appropriate, and agree with plan.  Ivin Marrow, MD, PGY-2 [facility] Family Medicine 7:13 PM 01/14/2024

## 2024-01-14 NOTE — ED Triage Notes (Signed)
 Patient has a tooth that is cracked and has been giving her trouble for several months but now has significant right sided facial swelling going down into neck that started last night.

## 2024-01-14 NOTE — ED Provider Notes (Signed)
 [place] EMERGENCY DEPARTMENT AT [facility] Provider Note   CSN: [id number] Arrival date & time: 01/14/24  1156     History {Add pertinent medical, surgical, social history, OB history to HPI:1} Chief Complaint  Patient presents with  . Facial Swelling    Dana Kennedy is a 58 y.o. 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  Patient has yes that she had a temperature of 101 F.  Patient that she has been on multiple antibiotics through the dentist and states symptoms are getting worse.  Patient states she can still swallow her own saliva but the eating is difficult.  Patient states that it is worse when she is lying down but when upright she is okay.  Patient Nuys any shortness of breath or chest pain or purulent material.  Home Medications Prior to Admission medications   Medication Sig Start Date End Date Taking? Authorizing Provider  acetaminophen  (TYLENOL ) 325 MG tablet Take 2 tablets (650 mg total) by mouth every 6 (six) hours as needed for mild pain (or Fever >/= 101). 08/26/18   Colin Dawley, MD  albuterol  (PROVENTIL ) (2.5 MG/3ML) 0.083% nebulizer solution Take 3 mLs (2.5 mg total) by nebulization every 2 (two) hours as needed for wheezing. 09/21/23   Chatterjee, Srobona Tublu, MD  arformoterol  (BROVANA ) 15 MCG/2ML NEBU Take 2 mLs (15 mcg total) by nebulization 2 (two) times daily. 09/21/23   Chatterjee, Srobona Tublu, MD  budesonide  (PULMICORT ) 0.25 MG/2ML nebulizer solution Take 2 mLs (0.25 mg total) by nebulization 2 (two) times daily. 09/21/23   Chatterjee, Srobona Tublu, MD  fluticasone  (FLONASE ) 50 MCG/ACT nasal spray Place 2 sprays into both nostrils daily. 06/28/20 09/17/23  Etter Hermann., MD  guaiFENesin  (MUCINEX ) 600 MG 12 hr tablet Take 2 tablets (1,200 mg total) by mouth 2 (two) times daily. 09/21/23   Chatterjee, Srobona Tublu, MD  hydrochlorothiazide   (MICROZIDE ) 12.5 MG capsule Take 1 capsule (12.5 mg total) by mouth daily. 06/28/20 09/17/23  Etter Hermann., MD  ibuprofen  (ADVIL ) 600 MG tablet Take 1 tablet (600 mg total) by mouth every 6 (six) hours as needed. Patient taking differently: Take 800 mg by mouth every 6 (six) hours as needed for headache or mild pain (pain score 1-3). 08/07/22   Gretel Leaven, PA-C  lip balm (CARMEX) ointment Apply topically as needed. 09/21/23   Chatterjee, Srobona Tublu, MD  menthol -cetylpyridinium (CEPACOL) 3 MG lozenge Take 1 lozenge (3 mg total) by mouth as needed for sore throat. 09/21/23   Chatterjee, Srobona Tublu, MD  Nebulizers (COMPRESSOR/NEBULIZER) MISC Use as directed. 09/21/23   Chatterjee, Srobona Tublu, MD  Olopatadine HCl 0.2 % SOLN Place 1 drop into both eyes daily. 04/01/19   [provider]  Spacer/Aero-Holding Chambers (AEROCHAMBER Z-STAT PLUS/MEDIUM) inhaler Use as instructed 06/28/20   Etter Hermann., MD      Allergies    Patient has no known allergies.    Review of Systems   Review of Systems  Physical Exam Updated Vital Signs BP 133/87 (BP Location: Right Arm)   Pulse 93   Temp 98.5 F (36.9 C)   Resp 17   Ht 5\' 6"  (1.676 m)   Wt 92.5 kg   LMP 02/03/2018   SpO2 100%   BMI 32.93 kg/m  Physical Exam  ED Results / Procedures / Treatments   Labs (all labs ordered are listed, but only abnormal results  are displayed) Labs Reviewed  BASIC METABOLIC PANEL WITH GFR - Abnormal; Notable for the following components:      Result Value   Glucose, Bld 181 (*)    All other components within normal limits  CBC WITH DIFFERENTIAL/PLATELET  I-STAT CG4 LACTIC ACID, ED    EKG None  Radiology No results found.  Procedures Procedures  {Document cardiac monitor, telemetry assessment procedure when appropriate:1}  Medications Ordered in ED Medications  oxyCODONE -acetaminophen  (PERCOCET/ROXICET) 5-325 MG per tablet 1 tablet (1 tablet Oral Given 01/14/24 1223)   ibuprofen  (ADVIL ) tablet 400 mg (400 mg Oral Given 01/14/24 1223)    ED Course/ Medical Decision Making/ A&P   {   Click here for ABCD2, HEART and other calculatorsREFRESH Note before signing :1}                              Medical Decision Making Amount and/or Complexity of Data Reviewed Labs: ordered. Radiology: ordered.  Risk Prescription drug management.   Dana Kennedy 58 y.o. presented today for dental pain. Working DDx that I considered at this time includes, but not limited to, reversible vs irreversible pulpitis, ludwig's angina, OM, dental abscess, PTA, RPA, Orofacial space infx, peripharyngeal space infx, dental caries, necrotizing gingivitis, gingivitis, peritonitis, parapharyngeal infx.  R/o DDx: ***: These are considered less likely due to history of present illness and physical exam findings  Review of prior external notes: 05/02/2023 office visit  Unique Tests and My Independent Interpretation:  CBC: Unremarkable BMP: Unremarkable Lactic acid: Unremarkable CT soft tissue neck with contrast: Pending  Social Determinants of Health: none  Discussion with Independent Historian: None  Discussion of Management of Tests: None  Risk: Medium: prescription drug management  Risk Stratification Score: None  Staffed with Trifan, MD  Plan: On exam patient was no acute distress but does have right-sided facial swelling and right-sided neck swelling.  Patient has clear lungs and is tolerating secretions with me not actively spitting however states that his heart to swallow when she is lying down so I placed her upright in the bed.  Patient at this time stable vitals but is endorsing fevers.  Patient does appear to have obvious infection from a broken tooth however I do not see any purulent material or places that can be lanced.  Will obtain basic labs along with pain meds and imaging to further assess.  Patient signed out to Marcy Sexton, MD.  Please review their note  for the continuation of patient's care.  The plan at this point is follow-up on imaging and if tolerating p.o. and nonseptic appearing still can have her follow-up with another dentist with antibiotics.  This chart was dictated using voice recognition software.  Despite best efforts to proofread,  errors can occur which can change the documentation meaning.  {Document critical care time when appropriate:1} {Document review of labs and clinical decision tools ie heart score, Chads2Vasc2 etc:1}  {Document your independent review of radiology images, and any outside records:1} {Document your discussion with family members, caretakers, and with consultants:1} {Document social determinants of health affecting pt's care:1} {Document your decision making why or why not admission, treatments were needed:1} Final Clinical Impression(s) / ED Diagnoses Final diagnoses:  None    Rx / DC Orders ED Discharge Orders     None

## 2024-01-14 NOTE — Assessment & Plan Note (Addendum)
 Right lower mandible. S/p Unasyn  3g x1 in emergency department. Swelling much better now, per patient. Will continue IV Unasyn  overnight. PO antibiotics seemed to work for similar infections 3-6 months ago. Recurrent infection likely d/t untreated dental cary, complicated by new abscess/soft tissue cellulitis. Will reach out to dentistry in AM for surgical evaluation of abscess.  - Admit to family medicine teaching service, Dr. Bevin Bucks attending - Continue Unasyn  tomorrow per pharmacy consult, consider transition to PO antibiotics tomorrow - Start PRN scheduled tylenol  1g q6h for tooth pain - Med-surg bed - Start regular diet - Fall precautions - Vitals/Pulse ox per floor

## 2024-01-15 DIAGNOSIS — M329 Systemic lupus erythematosus, unspecified: Secondary | ICD-10-CM | POA: Diagnosis present

## 2024-01-15 DIAGNOSIS — K122 Cellulitis and abscess of mouth: Secondary | ICD-10-CM | POA: Diagnosis present

## 2024-01-15 DIAGNOSIS — J45909 Unspecified asthma, uncomplicated: Secondary | ICD-10-CM | POA: Diagnosis present

## 2024-01-15 DIAGNOSIS — Z8379 Family history of other diseases of the digestive system: Secondary | ICD-10-CM | POA: Diagnosis not present

## 2024-01-15 DIAGNOSIS — Z79899 Other long term (current) drug therapy: Secondary | ICD-10-CM | POA: Diagnosis not present

## 2024-01-15 DIAGNOSIS — K047 Periapical abscess without sinus: Secondary | ICD-10-CM

## 2024-01-15 DIAGNOSIS — Z96642 Presence of left artificial hip joint: Secondary | ICD-10-CM | POA: Diagnosis present

## 2024-01-15 DIAGNOSIS — K029 Dental caries, unspecified: Secondary | ICD-10-CM | POA: Diagnosis present

## 2024-01-15 DIAGNOSIS — E119 Type 2 diabetes mellitus without complications: Secondary | ICD-10-CM | POA: Diagnosis present

## 2024-01-15 DIAGNOSIS — Z809 Family history of malignant neoplasm, unspecified: Secondary | ICD-10-CM | POA: Diagnosis not present

## 2024-01-15 DIAGNOSIS — Z7951 Long term (current) use of inhaled steroids: Secondary | ICD-10-CM | POA: Diagnosis not present

## 2024-01-15 DIAGNOSIS — Z8249 Family history of ischemic heart disease and other diseases of the circulatory system: Secondary | ICD-10-CM | POA: Diagnosis not present

## 2024-01-15 DIAGNOSIS — Z87891 Personal history of nicotine dependence: Secondary | ICD-10-CM | POA: Diagnosis not present

## 2024-01-15 DIAGNOSIS — Z7984 Long term (current) use of oral hypoglycemic drugs: Secondary | ICD-10-CM | POA: Diagnosis not present

## 2024-01-15 LAB — HEMOGLOBIN A1C
Hgb A1c MFr Bld: 5.8 % — ABNORMAL HIGH (ref 4.8–5.6)
Mean Plasma Glucose: 119.76 mg/dL

## 2024-01-15 LAB — GLUCOSE, CAPILLARY
Glucose-Capillary: 127 mg/dL — ABNORMAL HIGH (ref 70–99)
Glucose-Capillary: 95 mg/dL (ref 70–99)
Glucose-Capillary: 119 mg/dL — ABNORMAL HIGH (ref 70–99)
Glucose-Capillary: 112 mg/dL — ABNORMAL HIGH (ref 70–99)

## 2024-01-15 MED ORDER — MENTHOL 3 MG MT LOZG
1.0000 | LOZENGE | OROMUCOSAL | Status: DC | PRN
  Filled 2024-01-15: qty 9

## 2024-01-15 MED ORDER — OXYCODONE HCL 5 MG PO TABS
5.0000 mg | ORAL_TABLET | ORAL | Status: DC | PRN
  Administered 2024-01-15 – 2024-01-16 (×2): 5 mg via ORAL
  Filled 2024-01-15 (×2): qty 1

## 2024-01-15 NOTE — Plan of Care (Signed)

## 2024-01-15 NOTE — Assessment & Plan Note (Signed)
 Reached out to Dr. Serge Dancer with [facility]. No acute indication for inpatient extraction but will schedule outpatient for necessary extraction. Does not believe that Medicaid will present an issue. Some pain with solid foods, OK with softs.  - Continue Unasyn  tomorrow per pharmacy consult, consider transition to PO tomorrow. - Continue PRN tylenol  1g q6h for mild pain - Transition from IV tramadol  to PRN PO Oxycodone  5 mg q4h for severe pain. - Soft diet.

## 2024-01-15 NOTE — Progress Notes (Addendum)
   Daily Progress Note Intern Pager: [phone]  Patient name: Dana Kennedy Medical record number: [id number] Date of birth: 1966-04-10 Age: 58 y.o. Gender: female  Primary Care Provider: [facility] Consultants: [facility] (S/O) Code Status: Full  Pt Overview and Major Events to Date:   Dana Kennedy is a 58 y.o. female presenting with a 6 month history of tooth pain and swelling after she fractured a tooth in her right lower mandible. Found to have 5mm abscess on CT with surrounding cellulitis.  Assessment & Plan Dental abscess Reached out to Dr. Serge Dancer with [facility]. No acute indication for inpatient extraction but will schedule outpatient for necessary extraction. Does not believe that Medicaid will present an issue. Some pain with solid foods, OK with softs.  - Continue Unasyn  tomorrow per pharmacy consult, consider transition to PO tomorrow. - Continue PRN tylenol  1g q6h for mild pain - Transition from IV tramadol  to PRN PO Oxycodone  5 mg q4h for severe pain. - Soft diet. Chronic health problem Asthma: continue PRN albuterol , patient only needs 2x/week  T2DM (A1c 5.8%): Repeat A1c 5.8%. CBGs with meals, in low 100s.  SLE: Takes a medication at home for this but is unable to state which. Will continue to monitor. Cough: dry, unconcerning. Ordered PRN cough drops.  FEN/GI: Soft PPx: Lovenox  Dispo: Home, barriers include: IV ABx  Subjective:   On interview, swelling remains the same. Pain controlled with toradol . No new complaints. Would like a soft diet.    Objective:  BP: 132/79 HR: 76 RR: 18 T: 98.5 O2sat: 95% on RA  Significant vitals over past 24 hours:   Physical Exam:  General: Well-appearing female, NAD, attentive. ENTM/Neck: Swelling appears unchanged. Very tender to light touch.  Cardiovascular: RRR no m/r/g. Respiratory: CTAB. No w/r/r.  Abdomen: No tenderness in 4 quadrants. BS+.  Extremities: ROM intact.   Nonedematous.  Basic labs:  Most recent CBC Lab Results  Component Value Date   WBC 9.7 01/14/2024   HGB 13.1 01/14/2024   HCT 41.1 01/14/2024   MCV 85.3 01/14/2024   PLT 326 01/14/2024   Most recent BMP    Latest Ref Rng & Units 01/14/2024   12:22 PM  BMP  Glucose 70 - 99 mg/dL 440   BUN 6 - 20 mg/dL 9   Creatinine 1.02 - 7.25 mg/dL 3.66   Sodium 440 - 347 mmol/L 142   Potassium 3.5 - 5.1 mmol/L 3.6   Chloride 98 - 111 mmol/L 106   CO2 22 - 32 mmol/L 23   Calcium 8.9 - 10.3 mg/dL 9.1     Other pertinent labs:  A1c: 5.8%  Imaging/Diagnostic Tests:  No new imaging.   Gwyndolyn Lerner, MD 01/15/2024, 1:08 PM  PGY-1, [facility] Family Medicine FPTS Intern pager: [phone], text pages welcome Secure chat group [facility] Teaching Service

## 2024-01-15 NOTE — Progress Notes (Signed)
 Brought pt ice water  per request. No pain noted at this time.

## 2024-01-15 NOTE — Assessment & Plan Note (Addendum)
 Asthma: continue PRN albuterol , patient only needs 2x/week  T2DM (A1c 5.8%): Repeat A1c 5.8%. CBGs with meals, in low 100s.  SLE: Takes a medication at home for this but is unable to state which. Will continue to monitor. Cough: dry, unconcerning. Ordered PRN cough drops.

## 2024-01-16 ENCOUNTER — Other Ambulatory Visit (HOSPITAL_COMMUNITY): Payer: Self-pay

## 2024-01-16 DIAGNOSIS — K047 Periapical abscess without sinus: Secondary | ICD-10-CM | POA: Diagnosis not present

## 2024-01-16 LAB — GLUCOSE, CAPILLARY: Glucose-Capillary: 90 mg/dL (ref 70–99)

## 2024-01-16 MED ORDER — AMOXICILLIN-POT CLAVULANATE 875-125 MG PO TABS
1.0000 | ORAL_TABLET | Freq: Two times a day (BID) | ORAL | 0 refills | Status: AC
  Filled 2024-01-16: qty 12, 6d supply, fill #0

## 2024-01-16 MED ORDER — IBUPROFEN 600 MG PO TABS
600.0000 mg | ORAL_TABLET | Freq: Four times a day (QID) | ORAL | 0 refills | Status: DC | PRN
  Filled 2024-01-16: qty 30, 8d supply, fill #0

## 2024-01-16 MED ORDER — OXYCODONE HCL 5 MG PO TABS
5.0000 mg | ORAL_TABLET | ORAL | 0 refills | Status: AC | PRN
Start: 2024-01-16 — End: 2024-01-20
  Filled 2024-01-16: qty 18, 4d supply, fill #0

## 2024-01-16 MED ORDER — ACETAMINOPHEN 325 MG PO TABS: 650.0000 mg | ORAL_TABLET | Freq: Four times a day (QID) | ORAL | Status: AC | PRN

## 2024-01-16 MED ORDER — IBUPROFEN 600 MG PO TABS: 600.0000 mg | ORAL_TABLET | Freq: Four times a day (QID) | ORAL | Status: DC | PRN

## 2024-01-16 NOTE — Plan of Care (Signed)

## 2024-01-16 NOTE — Progress Notes (Signed)
 Pain med given to pt per request. Let her know that she can get next dose @12 :27p if needed. Pt verbalized understanding. All needs met at this time.

## 2024-01-16 NOTE — Progress Notes (Signed)
 Pt discharged to home via wheelchair by SWOT nurse with all belongings, paperwork, and discharge meds

## 2024-01-16 NOTE — Progress Notes (Signed)
Discharge instructions given to pt. Pt verbalized understanding of all teaching and had no further questions. 

## 2024-01-16 NOTE — Discharge Summary (Signed)
 Family Medicine Teaching [facility] Discharge Summary  Patient name: Dana Kennedy Medical record number: [id number] Date of birth: Nov 15, 1965 Age: 58 y.o. Gender: female Date of Admission: 01/14/2024  Date of Discharge: 01/16/2024 Admitting Physician: Charmel Cooter, MD  Primary Care Provider: [facility] Consultants: [facility]  Indication for Hospitalization: Dental abscess  Discharge Diagnoses/Problem List:  Principal Problem for Admission: Dental abscess Other Problems addressed during stay:  Principal Problem:   Dental abscess Active Problems:   Chronic health problem   Cellulitis and abscess of mouth    Brief Hospital Course:  Dana Kennedy is a 58 y.o.female with a history of right tooth fracture  who was admitted to the [facility] Medicine Teaching Service at [facility] for mandibular tooth abscess and attendant cellulitis. Her hospital course is detailed below:  Dental Abscess Endorsed right jaw swelling and tenderness on presentation. Vital signs stable throughout.  Afebrile throughout.  Pain controlled with Tylenol  and Dilaudid . Had received Unasyn  3 mg x1 in the ED. Received through [facility] (4/28 - 4/30). Was discharged on on augmentin  and scheduled to complete 5-day course (4/28 - 5/3).  Will follow with Dr. Serge Dancer with [facility]FS outpatient. Given 1 week of Oxy for pain control   Other chronic conditions were medically managed with home medications and formulary alternatives as necessary (T2DM, Asthma)  PCP Follow-up Recommendations: [facility] follow-up for definitive treatment of fractured tooth  Disposition: Home  Discharge Condition: stable  Discharge Exam:  Vitals:   01/16/24 0359 01/16/24 0756  BP: 130/84 129/83  Pulse: 78 69  Resp: 16 18  Temp: 98.5 F (36.9 C) 98.1 F (36.7 C)  SpO2: 97% 97%   CONST: NAD, well-appearing H/N: Tender to touch, swelling stable CV: RRR no MRG RESP: CTAB GI: BS+, no tenderness MSK: Full  ROM NEURO: Grossly intact PSYCH: Appropriate mood and affect  Significant Procedures: None  Significant Labs and Imaging:  No results for input(s): "WBC", "HGB", "HCT", "PLT" in the last 48 hours.  No results for input(s): "NA", "K", "CL", "CO2", "GLUCOSE", "BUN", "CREATININE", "CALCIUM", "MG", "PHOS", "ALKPHOS", "AST", "ALT", "ALBUMIN", "PROTEIN" in the last 48 hours.   CT soft tissue H/N (4/28):  IMPRESSION: 1. Dental decay and lucency around the roots of right mandibular tooth 30 with lateral cortical erosion and an adjacent subperiosteal abscess with maximal thickness of 5 mm. Regional cellulitis without evidence of remote abscess formation. 2. Mild reactive prominence of the level 1 lymph nodes on the right without suppuration.    Results/Tests Pending at Time of Discharge: None  Discharge Medications:  Allergies as of 01/16/2024   No Known Allergies      Medication List     STOP taking these medications    aerochamber Z-Stat Plus/medium inhaler   albuterol  (2.5 MG/3ML) 0.083% nebulizer solution Commonly known as: PROVENTIL    Aleve 220 MG tablet Generic drug: naproxen sodium   arformoterol  15 MCG/2ML Nebu Commonly known as: BROVANA    budesonide  0.25 MG/2ML nebulizer solution Commonly known as: PULMICORT    Compressor/Nebulizer Misc   fluticasone  50 MCG/ACT nasal spray Commonly known as: FLONASE    guaiFENesin  600 MG 12 hr tablet Commonly known as: MUCINEX    hydrochlorothiazide  12.5 MG capsule Commonly known as: Microzide    lip balm ointment   menthol -cetylpyridinium 3 MG lozenge Commonly known as: CEPACOL       TAKE these medications    acetaminophen  325 MG tablet Commonly known as: TYLENOL  Take 2 tablets (650 mg total) by mouth every 6 (six) hours  as needed for mild pain (pain score 1-3) (or Fever >/= 101).   amoxicillin -clavulanate 875-125 MG tablet Commonly known as: AUGMENTIN  Take 1 tablet by mouth 2 (two) times daily for 6 days.    ibuprofen  600 MG tablet Commonly known as: ADVIL  Take 1 tablet (600 mg total) by mouth every 6 (six) hours as needed for mild pain (pain score 1-3). What changed:  reasons to take this Another medication with the same name was removed. Continue taking this medication, and follow the directions you see here.   oxyCODONE  5 MG immediate release tablet Commonly known as: Oxy IR/ROXICODONE  Take 1 tablet (5 mg total) by mouth every 4 (four) hours as needed for up to 3 days for severe pain (pain score 7-10) or breakthrough pain.        Discharge Instructions: Please refer to Patient Instructions section of EMR for full details.  Patient was counseled important signs and symptoms that should prompt return to medical care, changes in medications, dietary instructions, activity restrictions, and follow up appointments.   Follow-Up Appointments:  Follow-up Information     Wannetta Gutting, DMD. Call.   Specialty: Oral Surgery Why: Please call in order to schedule appointment with oral surgery for extraction [phone]. Contact information: [address] [place] [address] [phone]         [facility]. Schedule an appointment as soon as possible for a visit in 1 week(s).   Specialty: Family Medicine Contact information: [address] [place] [address] [phone]                 Gwyndolyn Lerner, MD 01/16/2024, 2:40 PM PGY-1, [facility] Family Medicine

## 2024-04-07 ENCOUNTER — Emergency Department (HOSPITAL_COMMUNITY)
Admission: EM | Admit: 2024-04-07 | Discharge: 2024-04-07 | Disposition: A | Discharge status: Home / Self Care | Payer: MEDICAID | Attending: Emergency Medicine | Admitting: Emergency Medicine

## 2024-04-07 ENCOUNTER — Other Ambulatory Visit: Payer: Self-pay

## 2024-04-07 ENCOUNTER — Emergency Department (HOSPITAL_COMMUNITY): Payer: MEDICAID

## 2024-04-07 ENCOUNTER — Encounter (HOSPITAL_COMMUNITY): Payer: Self-pay

## 2024-04-07 DIAGNOSIS — R059 Cough, unspecified: Secondary | ICD-10-CM | POA: Diagnosis present

## 2024-04-07 DIAGNOSIS — J069 Acute upper respiratory infection, unspecified: Secondary | ICD-10-CM | POA: Diagnosis not present

## 2024-04-07 DIAGNOSIS — R590 Localized enlarged lymph nodes: Secondary | ICD-10-CM | POA: Insufficient documentation

## 2024-04-07 DIAGNOSIS — E1165 Type 2 diabetes mellitus with hyperglycemia: Secondary | ICD-10-CM | POA: Insufficient documentation

## 2024-04-07 HISTORY — DX: Essential (primary) hypertension: I10

## 2024-04-07 HISTORY — DX: Type 2 diabetes mellitus without complications: E11.9

## 2024-04-07 LAB — RESP PANEL BY RT-PCR (RSV, FLU A&B, COVID)  RVPGX2
Influenza A by PCR: NEGATIVE
Influenza B by PCR: NEGATIVE
Resp Syncytial Virus by PCR: NEGATIVE
SARS Coronavirus 2 by RT PCR: NEGATIVE

## 2024-04-07 LAB — CBG MONITORING, ED: Glucose-Capillary: 117 mg/dL — ABNORMAL HIGH (ref 70–99)

## 2024-04-07 LAB — GROUP A STREP BY PCR: Group A Strep by PCR: NOT DETECTED

## 2024-04-07 MED ORDER — AMOXICILLIN-POT CLAVULANATE 875-125 MG PO TABS: 1.0000 | ORAL_TABLET | Freq: Two times a day (BID) | ORAL | 0 refills | Status: AC

## 2024-04-07 MED ORDER — BENZONATATE 100 MG PO CAPS: 100.0000 mg | ORAL_CAPSULE | Freq: Three times a day (TID) | ORAL | 0 refills | Status: AC

## 2024-04-07 NOTE — Discharge Instructions (Addendum)
 Your labs today show no abnormal findings. Your chest x-ray showed no evidence of pneumonia.  Given the fact that you have had about 2 months of cyst symptoms I think it is appropriate to give you an antibiotic.  I have also ordered some medication for coughing.  You may treat your symptoms also with Tylenol  or ibuprofen  or any over-the-counter cough medication that is safe for high blood pressure.  Get help right away for any new or worsening symptoms.

## 2024-04-07 NOTE — ED Triage Notes (Addendum)
 Patient said she has a sore throat, something is in her ears, eyes. Feels that she has extra fluid in her lungs and that her lungs are broken. Coughing up green mucus. She believes this all began due to mold in the house. Has felt this way for 2 months.

## 2024-04-07 NOTE — ED Provider Notes (Signed)
 [place] EMERGENCY DEPARTMENT AT [facility] Provider Note   CSN: [id number] Arrival date & time: 04/07/24  1456     Patient presents with: Cough   Dana Kennedy is a 58 y.o. female with past medical history of SLE, RA, and diabetes who presents emergency department chief complaint of cough sore throat nasal congestion eye discomfort and ear fullness.  Patient reports that this has she has had a cough and sensation of sinus pressure and fullness for the past 2 months.  Over the past 2 weeks she developed sore throat and ear pressure.  She is concerned that she might be allergic to mold which is currently in her home.  She has no known history of the same.    Cough      Prior to Admission medications   Medication Sig Start Date End Date Taking? Authorizing Provider  amoxicillin -clavulanate (AUGMENTIN ) 875-125 MG tablet Take 1 tablet by mouth every 12 (twelve) hours. 04/07/24  Yes Tracy Gerken, PA-C  benzonatate  (TESSALON ) 100 MG capsule Take 1 capsule (100 mg total) by mouth every 8 (eight) hours. 04/07/24  Yes Abbott Jasinski, PA-C  acetaminophen  (TYLENOL ) 325 MG tablet Take 2 tablets (650 mg total) by mouth every 6 (six) hours as needed for mild pain (pain score 1-3) (or Fever >/= 101). 01/16/24   Alba Sharper, MD  ibuprofen  (ADVIL ) 600 MG tablet Take 1 tablet (600 mg total) by mouth every 6 (six) hours as needed for mild pain (pain score 1-3). 01/16/24   Rollene Katz, MD    Allergies: Patient has no known allergies.    Review of Systems  Respiratory:  Positive for cough.     Updated Vital Signs BP (!) 134/91 (BP Location: Left Arm)   Pulse 88   Temp 98.3 F (36.8 C) (Oral)   Resp 20   Ht 5' 5 (1.651 m)   Wt 85.7 kg   LMP 02/03/2018   SpO2 98%   BMI 31.45 kg/m   Physical Exam Vitals and nursing note reviewed.  Constitutional:      General: She is not in acute distress.    Appearance: She is well-developed. She is not diaphoretic.   HENT:     Head: Normocephalic and atraumatic.     Right Ear: Tympanic membrane and external ear normal.     Left Ear: Tympanic membrane and external ear normal.     Nose: Congestion and rhinorrhea present.     Mouth/Throat:     Mouth: Mucous membranes are moist.     Pharynx: No oropharyngeal exudate or posterior oropharyngeal erythema.  Eyes:     General: No scleral icterus.    Conjunctiva/sclera: Conjunctivae normal.  Neck:     Comments: Bilateral tonsillar adenopathy Cardiovascular:     Rate and Rhythm: Normal rate and regular rhythm.     Heart sounds: Normal heart sounds. No murmur heard.    No friction rub. No gallop.  Pulmonary:     Effort: Pulmonary effort is normal. No respiratory distress.     Breath sounds: Normal breath sounds.  Abdominal:     General: Bowel sounds are normal. There is no distension.     Palpations: Abdomen is soft. There is no mass.     Tenderness: There is no abdominal tenderness. There is no guarding.  Musculoskeletal:     Cervical back: Normal range of motion.  Lymphadenopathy:     Cervical: Cervical adenopathy present.  Skin:    General: Skin is warm and dry.  Neurological:     Mental Status: She is alert and oriented to person, place, and time.  Psychiatric:        Behavior: Behavior normal.     (all labs ordered are listed, but only abnormal results are displayed) Labs Reviewed  CBG MONITORING, ED - Abnormal; Notable for the following components:      Result Value   Glucose-Capillary 117 (*)    All other components within normal limits  RESP PANEL BY RT-PCR (RSV, FLU A&B, COVID)  RVPGX2  GROUP A STREP BY PCR    EKG: None  Radiology: DG Chest 2 View Result Date: 04/07/2024 CLINICAL DATA:  Cough and sore throat for 2 months. Right-sided chest pain. EXAM: CHEST - 2 VIEW COMPARISON:  09/16/2023 FINDINGS: The heart size and mediastinal contours are within normal limits. Both lungs are clear. The visualized skeletal structures are  unremarkable. IMPRESSION: No active cardiopulmonary disease. Electronically Signed   By: Norleen DELENA Kil M.D.   On: 04/07/2024 16:52     Procedures   Medications Ordered in the ED - No data to display                                  Medical Decision Making Amount and/or Complexity of Data Reviewed Radiology: ordered.  Risk Prescription drug management.   58 year old female who presents emergency department with URI symptoms..  I ordered labs which show mildly elevated CBG, respiratory panel and strep test negative.  I visualized and interpreted a two-view chest x-ray which shows no acute findings.  She has had greater than 2 weeks of symptoms therefore fits criteria for treatment of acute sinusitis.  Will treat with Augmentin .  She will use over-the-counter cough medications or Tessalon .  She appears otherwise appropriate for discharge at this time.  Discussed return precautions.     Final diagnoses:  Upper respiratory tract infection, unspecified type    ED Discharge Orders          Ordered    amoxicillin -clavulanate (AUGMENTIN ) 875-125 MG tablet  Every 12 hours        04/07/24 1834    benzonatate  (TESSALON ) 100 MG capsule  Every 8 hours        04/07/24 1834               Arloa Chroman, PA-C 04/07/24 1837    Lenor Hollering, MD 04/07/24 2233

## 2024-05-07 LAB — AMB RESULTS CONSOLE CBG: Glucose: 148

## 2024-05-07 NOTE — Progress Notes (Signed)
 Patient was seen for Blood sugar and BP check    BP was normal,  I did recommend she follow up with provider regarding her DM since pt did mention dizziness at times. Mobile unit info was given as well incase she was not able to get in with provider .

## 2024-07-02 NOTE — Progress Notes (Signed)
 The patient attended a screening event on 05/07/2024 where her BP screening results was 111/76, non-fasting blood glucose was 148. At the event the patient noted she has [employer] and does not smoke. Patient indicated having housing and transportation SDOH insecurity. Pt listed pcp as Dr. Cinthia at [facility]. Per chart review pt pcp that's list in [facility] is Dr. Camie CHRISTELLA Cools, MD at [facility] and the last office visit was 05/02/2023 for follow-up. The pt BP was 145/96 on 05/02/2023. On 05/02/2023 pt was seen for dental abscess and pain in mouth after seeing a dentist. Patient was also seen by a care team at [facility] for pain on 04/16/2024 via telehealth. Chart review does not indicate future appt for pt on [facility]. Post event initial f/u [facility] attempted and unable to contact pt via mobile phone # to follow-up on Housing, transportation SDOH needs. A vm was left on the mobile number #. Letter sent with Housing, Transportation and [place] [facility] community housing resources information flyers in case needed by pt. Additional pt f/u to be scheduled per health equity protocol.

## 2024-08-29 ENCOUNTER — Emergency Department (HOSPITAL_COMMUNITY)
Admission: EM | Admit: 2024-08-29 | Discharge: 2024-08-30 | Disposition: A | Payer: MEDICAID | Attending: Emergency Medicine | Admitting: Emergency Medicine

## 2024-08-29 ENCOUNTER — Emergency Department (HOSPITAL_COMMUNITY): Payer: MEDICAID

## 2024-08-29 ENCOUNTER — Encounter (HOSPITAL_COMMUNITY): Payer: Self-pay

## 2024-08-29 ENCOUNTER — Other Ambulatory Visit: Payer: Self-pay

## 2024-08-29 DIAGNOSIS — R200 Anesthesia of skin: Secondary | ICD-10-CM | POA: Insufficient documentation

## 2024-08-29 DIAGNOSIS — W19XXXA Unspecified fall, initial encounter: Secondary | ICD-10-CM

## 2024-08-29 DIAGNOSIS — W1839XA Other fall on same level, initial encounter: Secondary | ICD-10-CM | POA: Insufficient documentation

## 2024-08-29 DIAGNOSIS — M546 Pain in thoracic spine: Secondary | ICD-10-CM | POA: Insufficient documentation

## 2024-08-29 LAB — COMPREHENSIVE METABOLIC PANEL WITH GFR
ALT: 7 U/L (ref 0–44)
AST: 14 U/L — ABNORMAL LOW (ref 15–41)
Albumin: 4.2 g/dL (ref 3.5–5.0)
Alkaline Phosphatase: 107 U/L (ref 38–126)
Anion gap: 13 (ref 5–15)
BUN: 17 mg/dL (ref 6–20)
CO2: 22 mmol/L (ref 22–32)
Calcium: 9.5 mg/dL (ref 8.9–10.3)
Chloride: 107 mmol/L (ref 98–111)
Creatinine, Ser: 0.76 mg/dL (ref 0.44–1.00)
GFR, Estimated: >60 mL/min (ref >60)
Glucose, Bld: 128 mg/dL — ABNORMAL HIGH (ref 70–99)
Potassium: 3.9 mmol/L (ref 3.5–5.1)
Sodium: 142 mmol/L (ref 135–145)
Total Bilirubin: <0.2 mg/dL (ref 0.0–1.2)
Total Protein: 6.5 g/dL (ref 6.5–8.1)

## 2024-08-29 LAB — CBC
HCT: 42.4 % (ref 36.0–46.0)
Hemoglobin: 13.4 g/dL (ref 12.0–15.0)
MCH: 26.9 pg (ref 26.0–34.0)
MCHC: 31.6 g/dL (ref 30.0–36.0)
MCV: 85.1 fL (ref 80.0–100.0)
Platelets: 380 K/uL (ref 150–400)
RBC: 4.98 MIL/uL (ref 3.87–5.11)
RDW: 14.8 % (ref 11.5–15.5)
WBC: 6.9 K/uL (ref 4.0–10.5)
nRBC: 0 % (ref 0.0–0.2)

## 2024-08-29 LAB — DIFFERENTIAL
Abs Immature Granulocytes: 0.03 K/uL (ref 0.00–0.07)
Basophils Absolute: 0 K/uL (ref 0.0–0.1)
Basophils Relative: 1 %
Eosinophils Absolute: 0.3 K/uL (ref 0.0–0.5)
Eosinophils Relative: 4 %
Immature Granulocytes: 0 %
Lymphocytes Relative: 39 %
Lymphs Abs: 2.7 K/uL (ref 0.7–4.0)
Monocytes Absolute: 0.6 K/uL (ref 0.1–1.0)
Monocytes Relative: 9 %
Neutro Abs: 3.2 K/uL (ref 1.7–7.7)
Neutrophils Relative %: 47 %

## 2024-08-29 LAB — APTT: aPTT: 27 s (ref 24–36)

## 2024-08-29 LAB — PROTIME-INR
INR: 0.9 (ref 0.8–1.2)
Prothrombin Time: 12.2 s (ref 11.4–15.2)

## 2024-08-29 LAB — ETHANOL: Alcohol, Ethyl (B): <15 mg/dL (ref <15)

## 2024-08-29 LAB — CBG MONITORING, ED: Glucose-Capillary: 140 mg/dL — ABNORMAL HIGH (ref 70–99)

## 2024-08-29 NOTE — ED Triage Notes (Addendum)
 Pt reports falling onto her left side 2 days ago and is having left sided weakness since 0300 this morning. Pt states I think I am having a stroke. Equal strength bilaterally, no facial drooping or slurred speech noted. Pt ambulatory.

## 2024-08-29 NOTE — ED Provider Triage Note (Addendum)
 Emergency Medicine Provider Triage Evaluation Note  Dana Kennedy , a 58 y.o. female  was evaluated in triage.  Pt complains of L side of body feels numb.   She states she fell two days ago she turned around too fast and fell.  She fell left side down.  She states she's had left sided numbness since then.   She did not strike her head. Not on anticoagulation. States she has neck and back pain. No fevers.   Review of Systems  Positive: Neck pain Negative: Fever   Physical Exam  BP (!) 145/96   Pulse 99   Temp 97.8 F (36.6 C)   Resp 18   LMP 02/03/2018   SpO2 95%  Gen:   Awake, no distress   Resp:  Normal effort  MSK:   Moves extremities without difficulty  Other:  Left arm and left leg illicit pain with ROM but grip strength symmetric. Sensation symmetric. No facial droop.   Medical Decision Making  Medically screening exam initiated at 9:59 PM.  Appropriate orders placed.  Chakira A Fields-Baugham was informed that the remainder of the evaluation will be completed by another provider, this initial triage assessment does not replace that evaluation, and the importance of remaining in the ED until their evaluation is complete.  Head CT Well outside of stroke window and not VAN+  Has neck and back pain after fall (mechanical)   Neldon Hamp RAMAN, PA 08/29/24 2208    Neldon Hamp RAMAN, [place] 08/29/24 2209

## 2024-08-30 MED ORDER — METHOCARBAMOL 500 MG PO TABS: 500.0000 mg | ORAL_TABLET | Freq: Two times a day (BID) | ORAL | 0 refills | Status: AC

## 2024-08-30 MED ORDER — LIDOCAINE 5 % EX PTCH
1.0000 | MEDICATED_PATCH | CUTANEOUS | Status: DC
  Administered 2024-08-30: 1 via TRANSDERMAL
  Filled 2024-08-30: qty 1

## 2024-08-30 MED ORDER — LIDOCAINE 5 % EX PTCH: 1.0000 | MEDICATED_PATCH | CUTANEOUS | 0 refills | Status: DC

## 2024-08-30 MED ORDER — METHOCARBAMOL 500 MG PO TABS
500.0000 mg | ORAL_TABLET | Freq: Once | ORAL | Status: AC
  Administered 2024-08-30: 500 mg via ORAL
  Filled 2024-08-30: qty 1

## 2024-08-30 NOTE — ED Provider Notes (Signed)
 [place] EMERGENCY DEPARTMENT AT [facility] Provider Note   CSN: [id number] Arrival date & time: 08/29/24  2146     Patient presents with: Dana Kennedy is a 58 y.o. female.   The history is provided by the patient and medical records.  Fall   58 y.o. F with hx of anemia, RA, lupus, chronic back pain, presenting to the ED after a fall.  States she was throwing something in a dumpster and twisted the wrong way and fell on left side 2 days ago.  No head injury or LOC.  States since that time she has been having pain along left side of her pack and numb sensation to her left arm and leg.  Denies bowel or bladder incontinence.  Still walking as normal.  She is currently being enrolled into pain management.  Prior to Admission medications  Medication Sig Start Date End Date Taking? Authorizing Provider  lidocaine  (LIDODERM ) 5 % Place 1 patch onto the skin daily. Remove & Discard patch within 12 hours or as directed by MD 08/30/24  Yes Jarold Olam HERO, PA-C  methocarbamol  (ROBAXIN ) 500 MG tablet Take 1 tablet (500 mg total) by mouth 2 (two) times daily. 08/30/24  Yes Jarold Olam HERO, PA-C  acetaminophen  (TYLENOL ) 325 MG tablet Take 2 tablets (650 mg total) by mouth every 6 (six) hours as needed for mild pain (pain score 1-3) (or Fever >/= 101). 01/16/24   Quillen, Michael, MD  amoxicillin -clavulanate (AUGMENTIN ) 875-125 MG tablet Take 1 tablet by mouth every 12 (twelve) hours. 04/07/24   Harris, Abigail, PA-C  benzonatate  (TESSALON ) 100 MG capsule Take 1 capsule (100 mg total) by mouth every 8 (eight) hours. 04/07/24   Harris, Abigail, PA-C  ibuprofen  (ADVIL ) 600 MG tablet Take 1 tablet (600 mg total) by mouth every 6 (six) hours as needed for mild pain (pain score 1-3). 01/16/24   Rollene Katz, MD    Allergies: Patient has no known allergies.    Review of Systems  Musculoskeletal:  Positive for back pain.  All other systems reviewed and are  negative.   Updated Vital Signs BP 118/79 (BP Location: Right Arm)   Pulse 81   Temp 98.5 F (36.9 C) (Oral)   Resp 20   LMP 02/03/2018   SpO2 97%   Physical Exam Vitals and nursing note reviewed.  Constitutional:      Appearance: She is well-developed.  HENT:     Head: Normocephalic and atraumatic.  Eyes:     Conjunctiva/sclera: Conjunctivae normal.     Pupils: Pupils are equal, round, and reactive to light.  Cardiovascular:     Rate and Rhythm: Normal rate and regular rhythm.     Heart sounds: Normal heart sounds.  Pulmonary:     Effort: Pulmonary effort is normal.     Breath sounds: Normal breath sounds.  Abdominal:     General: Bowel sounds are normal.     Palpations: Abdomen is soft.  Musculoskeletal:        General: Normal range of motion.     Cervical back: Normal range of motion.     Comments: Spasm noted to left trapezius, no midline C/T/L spine tenderness or deformity noted, able to change position and maneuver on stretcher independently  Skin:    General: Skin is warm and dry.  Neurological:     Mental Status: She is alert and oriented to person, place, and time.     (all labs ordered are listed,  but only abnormal results are displayed) Labs Reviewed  COMPREHENSIVE METABOLIC PANEL WITH GFR - Abnormal; Notable for the following components:      Result Value   Glucose, Bld 128 (*)    AST 14 (*)    All other components within normal limits  CBG MONITORING, ED - Abnormal; Notable for the following components:   Glucose-Capillary 140 (*)    All other components within normal limits  PROTIME-INR  APTT  CBC  DIFFERENTIAL  ETHANOL  URINE DRUG SCREEN    EKG: None  Radiology: CT Cervical Spine Wo Contrast Result Date: 08/29/2024 EXAM: CT CERVICAL SPINE WITHOUT CONTRAST 08/29/2024 10:57:10 PM TECHNIQUE: CT of the cervical spine was performed without the administration of intravenous contrast. Multiplanar reformatted images are provided for review.  Automated exposure control, iterative reconstruction, and/or weight based adjustment of the mA/kV was utilized to reduce the radiation dose to as low as reasonably achievable. COMPARISON: None available. CLINICAL HISTORY: Neck trauma, midline tenderness (Age 73-64y). FINDINGS: BONES AND ALIGNMENT: No acute fracture or traumatic malalignment. DEGENERATIVE CHANGES: Mild degenerative changes of the mid cervical spine. SOFT TISSUES: No prevertebral soft tissue swelling. IMPRESSION: 1. No acute findings. Electronically signed by: Pinkie Pebbles MD 08/29/2024 11:04 PM EST RP Workstation: [id number]   CT Lumbar Spine Wo Contrast Result Date: 08/29/2024 EXAM: CT OF THE LUMBAR SPINE WITHOUT CONTRAST 08/29/2024 10:57:10 PM TECHNIQUE: CT of the lumbar spine was performed without the administration of intravenous contrast. Multiplanar reformatted images are provided for review. Automated exposure control, iterative reconstruction, and/or weight based adjustment of the mA/kV was utilized to reduce the radiation dose to as low as reasonably achievable. COMPARISON: None available. CLINICAL HISTORY: Low back pain, trauma. FINDINGS: BONES AND ALIGNMENT: Normal vertebral body heights. No acute fracture or suspicious bone lesion. Normal alignment. DEGENERATIVE CHANGES: No significant degenerative changes. SOFT TISSUES: Mild vascular calcifications. 3.5 cm simple right adnexal cyst, likely benign. IMPRESSION: 1. No acute findings. Electronically signed by: Pinkie Pebbles MD 08/29/2024 11:04 PM EST RP Workstation: [id number]   CT Thoracic Spine Wo Contrast Result Date: 08/29/2024 EXAM: CT THORACIC SPINE WITHOUT CONTRAST 08/29/2024 10:57:10 PM TECHNIQUE: CT of the thoracic spine was performed without the administration of intravenous contrast. Multiplanar reformatted images are provided for review. Automated exposure control, iterative reconstruction, and/or weight based adjustment of the mA/kV was utilized to reduce the  radiation dose to as low as reasonably achievable. COMPARISON: None available. CLINICAL HISTORY: Mid-back pain FINDINGS: BONES AND ALIGNMENT: Normal vertebral body heights. No acute fracture or suspicious bone lesion. Normal alignment. DEGENERATIVE CHANGES: No significant degenerative changes. SOFT TISSUES: Calcified subcarinal node, benign. IMPRESSION: 1. No acute abnormality of the thoracic spine. Electronically signed by: Pinkie Pebbles MD 08/29/2024 11:02 PM EST RP Workstation: [id number]   CT Head Wo Contrast Result Date: 08/29/2024 EXAM: CT HEAD WITHOUT 08/29/2024 10:57:10 PM TECHNIQUE: CT of the head was performed without the administration of intravenous contrast. Automated exposure control, iterative reconstruction, and/or weight based adjustment of the mA/kV was utilized to reduce the radiation dose to as low as reasonably achievable. COMPARISON: MRI brain dated 02/08/2019. CLINICAL HISTORY: Head trauma, moderate-severe. FINDINGS: BRAIN AND VENTRICLES: No acute intracranial hemorrhage. No mass effect or midline shift. No extra-axial fluid collection. No evidence of acute infarct. No hydrocephalus. ORBITS: No acute abnormality. SINUSES AND MASTOIDS: No acute abnormality. SOFT TISSUES AND SKULL: No acute skull fracture. No acute soft tissue abnormality. IMPRESSION: 1. No acute intracranial abnormality. Electronically signed by: Pinkie Pebbles MD 08/29/2024 11:01 PM EST RP Workstation: [id number]  Procedures   Medications Ordered in the ED  methocarbamol  (ROBAXIN ) tablet 500 mg (has no administration in time range)  lidocaine  (LIDODERM ) 5 % 1 patch (has no administration in time range)                                    Medical Decision Making Amount and/or Complexity of Data Reviewed Radiology: ordered and independent interpretation performed. ECG/medicine tests: ordered and independent interpretation performed.  Risk Prescription drug management.   58 y.o. F here with left  sided back pain after fall 2 days ago while taking out trash.  States she feels numb sensation on her left side.  AAOx3 here.  Does not have any focal deficits.  She has muscular tenderness along left trap and into left thoracic paraspinal musculature.  There is no midline step-off or deformity noted.  She is easily maneuvering on stretcher unassisted.  CT head and full spine CTs obtained from triage after MSE--all are normal without any noted fracture or malalignment.  Results discussed with patient.  Suspect this is likely muscular in origin.  She also has a degree of chronic back pain and has been referred to pain management for this.  Will treat symptomatically, encourage close PCP follow-up.  Can return here for new concerns.  Final diagnoses:  Fall, initial encounter  Acute left-sided thoracic back pain    ED Discharge Orders          Ordered    methocarbamol  (ROBAXIN ) 500 MG tablet  2 times daily        08/30/24 0333    lidocaine  (LIDODERM ) 5 %  Every 24 hours        08/30/24 0333               Jarold Olam HERO, PA-C 08/30/24 0426    Palumbo, April, MD 08/30/24 [phone]

## 2024-08-30 NOTE — Discharge Instructions (Addendum)
 Head and full spine CT's today were normal.  This is likely muscular. Take the prescribed medication as directed. Follow-up with your primary care doctor. Return to the ED for new or worsening symptoms.

## 2024-09-01 ENCOUNTER — Encounter (HOSPITAL_COMMUNITY): Payer: Self-pay

## 2024-09-01 ENCOUNTER — Other Ambulatory Visit: Payer: Self-pay

## 2024-09-01 ENCOUNTER — Emergency Department (HOSPITAL_COMMUNITY)
Admission: EM | Admit: 2024-09-01 | Discharge: 2024-09-01 | Disposition: A | Discharge status: Home / Self Care | Payer: MEDICAID | Attending: Emergency Medicine | Admitting: Emergency Medicine

## 2024-09-01 DIAGNOSIS — W19XXXA Unspecified fall, initial encounter: Secondary | ICD-10-CM | POA: Diagnosis not present

## 2024-09-01 DIAGNOSIS — M25512 Pain in left shoulder: Secondary | ICD-10-CM | POA: Diagnosis not present

## 2024-09-01 DIAGNOSIS — Z79899 Other long term (current) drug therapy: Secondary | ICD-10-CM | POA: Diagnosis not present

## 2024-09-01 DIAGNOSIS — M542 Cervicalgia: Secondary | ICD-10-CM | POA: Diagnosis present

## 2024-09-01 DIAGNOSIS — R0789 Other chest pain: Secondary | ICD-10-CM | POA: Diagnosis not present

## 2024-09-01 DIAGNOSIS — M546 Pain in thoracic spine: Secondary | ICD-10-CM | POA: Diagnosis not present

## 2024-09-01 MED ORDER — OXYCODONE HCL 5 MG PO TABS
5.0000 mg | ORAL_TABLET | Freq: Once | ORAL | Status: AC
  Administered 2024-09-01: 19:00:00 5 mg via ORAL
  Filled 2024-09-01: qty 1

## 2024-09-01 MED ORDER — IBUPROFEN 800 MG PO TABS: 800.0000 mg | ORAL_TABLET | Freq: Three times a day (TID) | ORAL | 0 refills | Status: AC | PRN

## 2024-09-01 MED ORDER — BACLOFEN 10 MG PO TABS: 10.0000 mg | ORAL_TABLET | Freq: Three times a day (TID) | ORAL | 0 refills | Status: AC

## 2024-09-01 MED ORDER — OXYCODONE-ACETAMINOPHEN 5-325 MG PO TABS: 1.0000 | ORAL_TABLET | Freq: Three times a day (TID) | ORAL | 0 refills | Status: AC | PRN

## 2024-09-01 MED ORDER — LIDOCAINE 5 % EX PTCH: 1.0000 | MEDICATED_PATCH | CUTANEOUS | 0 refills | Status: AC

## 2024-09-01 MED ORDER — LIDOCAINE 5 % EX PTCH
1.0000 | MEDICATED_PATCH | CUTANEOUS | Status: DC
  Administered 2024-09-01: 19:00:00 1 via TRANSDERMAL
  Filled 2024-09-01: qty 1

## 2024-09-01 NOTE — Discharge Instructions (Signed)
 Take 1000 mg of Tylenol  every 8 hours. Take 800 mg of ibuprofen  every 8 hours but make sure you take this with food. Take baclofen  as needed which is a muscle relaxer.  Take oxycodone  for breakthrough pain.  Do not drive or operate heavy machinery when taking oxycodone  or baclofen  as it can make you tired.  Use lidocaine  patch ice and heat over area of pain and return to emergency room with new or worsening symptoms.

## 2024-09-01 NOTE — ED Triage Notes (Signed)
 Quick triage note : Pt reports left side pain x 4 days after falling on left side. Reports dx with muscle spasms when initially evaluated for the fall on 12/12, reports minimal relief with prescribed muscle relaxer

## 2024-09-01 NOTE — Telephone Encounter (Signed)
 Spoke with patient to schedule EDFU appointment. Patient became upset when offered an appointment with Dr. Deane as patient states can only come on Wednesday or Thursday. Patient became upset You done made me angry, I tell you I'm in pain and you offer me an appointment on the 31st, why can't I see her this Wednesday. Explained to patient she is booked this Wednesday but can get her scheduled with another provider. I'm about to end this call why can't I see Dr. Deane this Wednesday. Again attempted to explain that Dr. Deane is booked this Wednesday but can try to schedule an EDFU with another provider. I'm done, I'll just go to UC and hung up.

## 2024-09-01 NOTE — Telephone Encounter (Signed)
 Copied from CRM #[id number]. Topic: Schedule Appointment - Schedule Patient >> Sep 01, 2024  2:35 PM Joen NOVAK wrote: Sonya, Gunnoe is calling other request    Include all details related to the request(s) below:  Patient is calling for hospital follow up with Dr Deane from [facility] 08-29-24 she is in pain the hospital is telling her muscle spasms.  I offered tomorrow she declined she needs wednesday or Thursday to to transportation   Call back asap [phone]    Confirm and type the Best Contact Number below:  Patient/caller contact number:             [] Home  [] Mobile  [] Work [] Other   [] Okay to leave a voicemail   Medication List:  Current Outpatient Medications:    albuterol  HFA (PROVENTIL  HFA;VENTOLIN  HFA;PROAIR  HFA) 90 mcg/actuation inhaler, Inhale 2 puffs every 4 (four) hours as needed for wheezing., Disp: 1 each, Rfl: 2   DULoxetine (CYMBALTA) 30 mg capsule, Take 30 mg by mouth Once Daily for 30 days., Disp: 30 capsule, Rfl: 0   ergocalciferol (VITAMIN D2) 1,250 mcg (50,000 unit) capsule, Take 1 capsule (50,000 Units total) by mouth every 7 days., Disp: 12 capsule, Rfl: 3   fluticasone  furoate-vilanteroL (Breo Ellipta ) 200-25 mcg/dose dsdv inhaler, Inhale 1 puff., Disp: , Rfl:    fluticasone  propionate (FLONASE ) 50 mcg/spray nasal spray, 2 sprays., Disp: , Rfl:    hydroxychloroquine  (PLAQUENIL ) 200 mg tablet, Take 300 mg by mouth Once Daily for 90 days., Disp: 45 tablet, Rfl: 3   inhalat.spacing dev,med. mask (AeroChamber Plus Z Stat Md Msk) spcr, Use as instructed, Disp: , Rfl:    ketorolac  (TORADOL ) 10 mg tablet, Take 10 mg by mouth., Disp: , Rfl:    metFORMIN (GLUCOPHAGE) 500 mg tablet, Take  by mouth., Disp: 60 tablet, Rfl: 11   metroNIDAZOLE  (FLAGYL ) 500 mg tablet, Take 500 mg by mouth., Disp: , Rfl:    montelukast  (SINGULAIR ) 10 mg tablet, Take 10 mg by mouth., Disp: , Rfl:    olopatadine (PATADAY) 0.2 % ophthalmic solution, Administer 1 drop  into affected eye(s) Once Daily for 30 days. Indications: allergic conjunctivitis, Disp: 5 mL, Rfl: 1   ondansetron  (ZOFRAN -ODT) 4 mg disintegrating tablet, , Disp: , Rfl: 0   oxyCODONE -acetaminophen  (PERCOCET) 10-325 mg per tablet, Take 1 tablet by mouth 3 (three) times a day., Disp: , Rfl:    triamcinolone acetonide (KENALOG) 0.1 % paste, Apply to affected area three times a day til healed., Disp: 5 g, Rfl: 2   valACYclovir  (VALTREX ) 1 gram tablet, TAKE 1 TABLET (1,000 MG TOTAL) BY MOUTH 2 (TWO) TIMES DAILY FOR 1 DAY., Disp: , Rfl:      Medication Request/Refills: Pharmacy Information (if applicable)   [] Not Applicable       []  Pharmacy listed  Send Medication Request to:                                                 [] Pharmacy not listed (added to pharmacy list in Epic) Send Medication Request to:      Listed Pharmacies: [id number] [place], [place] - [address] - PHONE: [phone] - FAX: [phone] [id number] [place], [place] - [address] - PHONE: [phone] - FAX: [phone]

## 2024-09-01 NOTE — ED Provider Notes (Signed)
 [place] EMERGENCY DEPARTMENT AT [facility] Provider Note   CSN: [id number] Arrival date & time: 09/01/24  1727     Patient presents with: Dana Kennedy is a 58 y.o. female patient with past medical history of idiopathic thrombocytopenia, lupus, GERD, rheumatoid arthritis presents to emergency room with complaint of pain.  Patient reports that 4 days ago she had mechanical fall falling onto her left side.  She reports that since then she has had left sided neck pain and left sided upper back pain.  She reports that she was seen in emergency room for this at [facility] and she had imaging done and was given muscle relaxer.  She reports that the muscle relaxer is not helping and she actually feels her symptoms are getting worse.  She denies any chest pain, shortness of breath, numbness and tingling in extremities.  She is requesting drug her pain medicine.    Fall       Prior to Admission medications  Medication Sig Start Date End Date Taking? Authorizing Provider  acetaminophen  (TYLENOL ) 325 MG tablet Take 2 tablets (650 mg total) by mouth every 6 (six) hours as needed for mild pain (pain score 1-3) (or Fever >/= 101). 01/16/24   Quillen, Michael, MD  amoxicillin -clavulanate (AUGMENTIN ) 875-125 MG tablet Take 1 tablet by mouth every 12 (twelve) hours. 04/07/24   Harris, Abigail, PA-C  benzonatate  (TESSALON ) 100 MG capsule Take 1 capsule (100 mg total) by mouth every 8 (eight) hours. 04/07/24   Harris, Abigail, PA-C  ibuprofen  (ADVIL ) 600 MG tablet Take 1 tablet (600 mg total) by mouth every 6 (six) hours as needed for mild pain (pain score 1-3). 01/16/24   Rollene Katz, MD  lidocaine  (LIDODERM ) 5 % Place 1 patch onto the skin daily. Remove & Discard patch within 12 hours or as directed by MD 08/30/24   Jarold Olam HERO, PA-C  methocarbamol  (ROBAXIN ) 500 MG tablet Take 1 tablet (500 mg total) by mouth 2 (two) times daily. 08/30/24   Jarold Olam HERO, PA-C     Allergies: Patient has no known allergies.    Review of Systems  Constitutional:  Positive for activity change.    Updated Vital Signs BP (!) 135/91 (BP Location: Right Arm)   Pulse 89   Temp 98.5 F (36.9 C)   Resp 18   LMP 02/03/2018   SpO2 96%   Physical Exam Vitals and nursing note reviewed.  Constitutional:      General: She is not in acute distress.    Appearance: She is not toxic-appearing.  HENT:     Head: Normocephalic and atraumatic.  Eyes:     General: No scleral icterus.    Conjunctiva/sclera: Conjunctivae normal.  Cardiovascular:     Rate and Rhythm: Normal rate and regular rhythm.     Pulses: Normal pulses.     Heart sounds: Normal heart sounds.  Pulmonary:     Effort: Pulmonary effort is normal. No respiratory distress.     Breath sounds: Normal breath sounds.  Chest:     Chest wall: Tenderness present.  Abdominal:     General: Abdomen is flat. Bowel sounds are normal.     Palpations: Abdomen is soft.     Tenderness: There is no abdominal tenderness.  Musculoskeletal:       Arms:       Back:  Skin:    General: Skin is warm and dry.     Findings: No lesion.  Neurological:  General: No focal deficit present.     Mental Status: She is alert and oriented to person, place, and time. Mental status is at baseline.     (all labs ordered are listed, but only abnormal results are displayed) Labs Reviewed - No data to display  EKG: None  Radiology: No results found.   Procedures   Medications Ordered in the ED - No data to display                                  Medical Decision Making Risk Prescription drug management.   This patient presents to the ED for concern of neck and shoulder pain, this involves an extensive number of treatment options, and is a complaint that carries with it a high risk of complications and morbidity.  The differential diagnosis includes encephalitis, meningitis, rib fracture, muscle  spasm    Cardiac Monitoring: / EKG:  The patient was maintained on a cardiac monitor.     Problem List / ED Course / Critical interventions / Medication management  Presents with mechanical fall 4 days ago and ahs already been seen for this. She is needing pain control. She has pain over her neck and upper back.  This is worse with movement and reproducible on exam.  She is neurovascularly intact.  Suspicious that this is musculoskeletal in etiology. I did review patient's most recent emergency room visit 08/29/2024.  She had mechanical fall falling onto her left side.  She denied any red flag symptoms at that time.  She is requesting pain management.  During this visit she had some extensive workup including CT scan of head, cervical spine thoracic spine and lumbar spine.  She continues to locate her pain over these areas with no significant change in symptoms. I ordered medication including Oxycodone .  Reevaluation of the patient after these medicines showed that the patient improved I have reviewed the patients home medicines and have made adjustments as needed. Patient has had pretty extensive workup for this.  She stable and well-appearing with no new symptoms.  I do not think repeat imaging is required at this time.  Will continue with conservative management over the next 3 to 5 days with PCP follow-up and return precautions.        Final diagnoses:  Fall, initial encounter    ED Discharge Orders          Ordered    baclofen  (LIORESAL ) 10 MG tablet  3 times daily        09/01/24 1854    oxyCODONE -acetaminophen  (PERCOCET/ROXICET) 5-325 MG tablet  Every 8 hours PRN        09/01/24 1854    lidocaine  (LIDODERM ) 5 %  Every 24 hours        09/01/24 1854    ibuprofen  (ADVIL ) 800 MG tablet  Every 8 hours PRN        09/01/24 1854               [name], [name], PA-C 09/01/24 [name], MD 09/01/24 [phone]

## 2024-09-01 NOTE — ED Triage Notes (Signed)
 Pt states she went to [facility] for the same on Friday and the muscle relaxer she was given is not working. PT states the pain is getting worse.

## 2024-10-07 NOTE — Progress Notes (Unsigned)
 The patient attended a screening event on 05/07/2024 where her BP screening results was 111/76, non-fasting blood glucose was 148. At the event the patient noted she has [employer] and does not smoke. Patient indicated having housing and transportation SDOH insecurity. Pt listed pcp as Dr. Cinthia at [facility].   Per chart review pt pcp that's list in [facility] is Dr. Camie CHRISTELLA Cools, MD at [facility] and the last office visit was 05/02/2023 for follow-up. The pt BP was 145/96 on 05/02/2023. On 05/02/2023 pt was seen for dental abscess and pain in mouth after seeing a dentist. Patient was also seen by a care team at [facility] for pain on 04/16/2024 via telehealth. Chart review does not indicate future appt for pt on [facility]. Post event initial f/u [facility] attempted and unable to contact pt via mobile phone # to follow-up on Housing, transportation SDOH needs. A vm was left on the mobile number #. Letter sent with Housing, Transportation and [place] [employer] community housing resources information flyers in case needed by pt. Additional pt f/u to be scheduled per health equity protocol.   Post 60-day f/u chart review indicates pt was seen by her pcp via telephone encounter for muscle spasm.  [facility]W attempted and unable to contact pt via mobile phone # to follow-up on Housing, transportation SDOH needs. A vm was left on the mobile number #. Letter sent with Housing, Transportation and  [employer] community housing resources information flyers in case needed by pt. Additional pt f/u to be scheduled per health equity protocol.
